# Patient Record
Sex: Female | Born: 1945 | Race: Black or African American | Hispanic: No | Marital: Married | Smoking: Former smoker
Health system: Southern US, Community
[De-identification: ages and names within clinical notes are randomized; demographics above are authoritative.]

## PROBLEM LIST (undated history)

## (undated) DIAGNOSIS — Z95810 Presence of automatic (implantable) cardiac defibrillator: Secondary | ICD-10-CM

## (undated) DIAGNOSIS — C50919 Malignant neoplasm of unspecified site of unspecified female breast: Secondary | ICD-10-CM

## (undated) DIAGNOSIS — I472 Ventricular tachycardia, unspecified: Secondary | ICD-10-CM

## (undated) DIAGNOSIS — E785 Hyperlipidemia, unspecified: Secondary | ICD-10-CM

## (undated) DIAGNOSIS — I2510 Atherosclerotic heart disease of native coronary artery without angina pectoris: Secondary | ICD-10-CM

## (undated) DIAGNOSIS — M84374A Stress fracture, right foot, initial encounter for fracture: Secondary | ICD-10-CM

## (undated) DIAGNOSIS — Z79811 Long term (current) use of aromatase inhibitors: Secondary | ICD-10-CM

## (undated) DIAGNOSIS — R21 Rash and other nonspecific skin eruption: Secondary | ICD-10-CM

## (undated) DIAGNOSIS — M1990 Unspecified osteoarthritis, unspecified site: Secondary | ICD-10-CM

## (undated) DIAGNOSIS — Z923 Personal history of irradiation: Secondary | ICD-10-CM

## (undated) DIAGNOSIS — I6529 Occlusion and stenosis of unspecified carotid artery: Secondary | ICD-10-CM

## (undated) DIAGNOSIS — N200 Calculus of kidney: Secondary | ICD-10-CM

## (undated) DIAGNOSIS — F1010 Alcohol abuse, uncomplicated: Secondary | ICD-10-CM

## (undated) DIAGNOSIS — R55 Syncope and collapse: Secondary | ICD-10-CM

## (undated) DIAGNOSIS — N179 Acute kidney failure, unspecified: Secondary | ICD-10-CM

## (undated) DIAGNOSIS — I10 Essential (primary) hypertension: Secondary | ICD-10-CM

## (undated) HISTORY — DX: Calculus of kidney: N20.0

## (undated) HISTORY — DX: Acute kidney failure, unspecified: N17.9

## (undated) HISTORY — DX: Atherosclerotic heart disease of native coronary artery without angina pectoris: I25.10

## (undated) HISTORY — PX: ABDOMINAL HYSTERECTOMY: SHX81

## (undated) HISTORY — DX: Alcohol abuse, uncomplicated: F10.10

## (undated) HISTORY — DX: Hyperlipidemia, unspecified: E78.5

## (undated) HISTORY — DX: Syncope and collapse: R55

## (undated) HISTORY — DX: Essential (primary) hypertension: I10

## (undated) HISTORY — DX: Malignant neoplasm of unspecified site of unspecified female breast: C50.919

## (undated) HISTORY — DX: Occlusion and stenosis of unspecified carotid artery: I65.29

## (undated) HISTORY — PX: CARDIAC CATHETERIZATION: SHX172

## (undated) HISTORY — DX: Ventricular tachycardia, unspecified: I47.20

## (undated) HISTORY — DX: Ventricular tachycardia: I47.2

---

## 1999-08-11 ENCOUNTER — Ambulatory Visit (HOSPITAL_COMMUNITY): Admission: RE | Admit: 1999-08-11 | Discharge: 1999-08-11 | Payer: Self-pay | Admitting: Orthopedic Surgery

## 2004-11-11 ENCOUNTER — Ambulatory Visit: Payer: Self-pay | Admitting: Psychiatry

## 2004-11-11 ENCOUNTER — Other Ambulatory Visit (HOSPITAL_COMMUNITY): Admission: RE | Admit: 2004-11-11 | Discharge: 2004-12-03 | Payer: Self-pay | Admitting: Psychiatry

## 2006-05-11 ENCOUNTER — Emergency Department (HOSPITAL_COMMUNITY): Admission: EM | Admit: 2006-05-11 | Discharge: 2006-05-11 | Payer: Self-pay | Admitting: Emergency Medicine

## 2009-07-05 HISTORY — PX: OTHER SURGICAL HISTORY: SHX169

## 2009-08-22 ENCOUNTER — Encounter: Payer: Self-pay | Admitting: Cardiology

## 2009-08-22 ENCOUNTER — Ambulatory Visit: Payer: Self-pay | Admitting: Internal Medicine

## 2009-08-22 ENCOUNTER — Encounter (INDEPENDENT_AMBULATORY_CARE_PROVIDER_SITE_OTHER): Payer: Self-pay | Admitting: Family Medicine

## 2009-08-22 LAB — CONVERTED CEMR LAB
Benzodiazepines.: NEGATIVE
Methadone: NEGATIVE

## 2009-08-23 ENCOUNTER — Encounter (INDEPENDENT_AMBULATORY_CARE_PROVIDER_SITE_OTHER): Payer: Self-pay | Admitting: Family Medicine

## 2009-09-04 DIAGNOSIS — R002 Palpitations: Secondary | ICD-10-CM

## 2009-09-04 DIAGNOSIS — R0602 Shortness of breath: Secondary | ICD-10-CM

## 2009-09-05 ENCOUNTER — Ambulatory Visit: Payer: Self-pay | Admitting: Cardiology

## 2009-09-05 ENCOUNTER — Ambulatory Visit: Payer: Self-pay

## 2009-09-05 DIAGNOSIS — R55 Syncope and collapse: Secondary | ICD-10-CM

## 2009-09-05 DIAGNOSIS — I10 Essential (primary) hypertension: Secondary | ICD-10-CM | POA: Insufficient documentation

## 2009-09-10 LAB — CONVERTED CEMR LAB
AST: 98 units/L — ABNORMAL HIGH (ref 0–37)
Albumin: 3.9 g/dL (ref 3.5–5.2)
Alkaline Phosphatase: 78 units/L (ref 39–117)
CO2: 30 meq/L (ref 19–32)
Calcium: 8.9 mg/dL (ref 8.4–10.5)
Cholesterol: 242 mg/dL — ABNORMAL HIGH (ref 0–200)
Direct LDL: 103.6 mg/dL
GFR calc non Af Amer: 129.71 mL/min (ref >60)
Total Bilirubin: 1.1 mg/dL (ref 0.3–1.2)
Total CHOL/HDL Ratio: 2
Triglycerides: 58 mg/dL (ref 0.0–149.0)
VLDL: 11.6 mg/dL (ref 0.0–40.0)

## 2009-09-15 ENCOUNTER — Ambulatory Visit: Payer: Self-pay | Admitting: Internal Medicine

## 2009-09-15 LAB — CONVERTED CEMR LAB
Basophils Relative: 1 % (ref 0–1)
Chloride: 104 meq/L (ref 96–112)
Cholesterol: 217 mg/dL — ABNORMAL HIGH (ref 0–200)
HCT: 46.9 % — ABNORMAL HIGH (ref 36.0–46.0)
LDL Cholesterol: 125 mg/dL — ABNORMAL HIGH (ref 0–99)
Lymphocytes Relative: 39 % (ref 12–46)
Lymphs Abs: 3.1 10*3/uL (ref 0.7–4.0)
MCHC: 33 g/dL (ref 30.0–36.0)
Monocytes Relative: 8 % (ref 3–12)
Neutrophils Relative %: 50 % (ref 43–77)
Platelets: 234 10*3/uL (ref 150–400)
TSH: 1.523 microintl units/mL (ref 0.350–4.500)
Triglycerides: 92 mg/dL (ref <150)

## 2009-09-16 ENCOUNTER — Telehealth: Payer: Self-pay | Admitting: Cardiology

## 2009-09-16 DIAGNOSIS — I472 Ventricular tachycardia, unspecified: Secondary | ICD-10-CM | POA: Insufficient documentation

## 2009-09-24 ENCOUNTER — Ambulatory Visit: Payer: Self-pay | Admitting: Internal Medicine

## 2009-09-24 ENCOUNTER — Encounter (INDEPENDENT_AMBULATORY_CARE_PROVIDER_SITE_OTHER): Payer: Self-pay | Admitting: Family Medicine

## 2009-09-24 LAB — CONVERTED CEMR LAB: Hgb A1c MFr Bld: 4.9 % (ref 4.6–6.1)

## 2009-10-02 ENCOUNTER — Ambulatory Visit (HOSPITAL_COMMUNITY): Admission: RE | Admit: 2009-10-02 | Discharge: 2009-10-02 | Payer: Self-pay | Admitting: Family Medicine

## 2009-10-02 ENCOUNTER — Telehealth (INDEPENDENT_AMBULATORY_CARE_PROVIDER_SITE_OTHER): Payer: Self-pay | Admitting: *Deleted

## 2009-10-03 HISTORY — PX: CORONARY ARTERY BYPASS GRAFT: SHX141

## 2009-10-06 ENCOUNTER — Encounter (HOSPITAL_COMMUNITY): Admission: RE | Admit: 2009-10-06 | Discharge: 2009-12-03 | Payer: Self-pay | Admitting: Cardiology

## 2009-10-06 ENCOUNTER — Ambulatory Visit: Payer: Self-pay | Admitting: Internal Medicine

## 2009-10-06 ENCOUNTER — Ambulatory Visit: Payer: Self-pay

## 2009-10-06 ENCOUNTER — Encounter: Payer: Self-pay | Admitting: Cardiology

## 2009-10-08 ENCOUNTER — Telehealth: Payer: Self-pay | Admitting: Cardiology

## 2009-10-08 ENCOUNTER — Ambulatory Visit: Payer: Self-pay | Admitting: Cardiology

## 2009-10-08 DIAGNOSIS — I5042 Chronic combined systolic (congestive) and diastolic (congestive) heart failure: Secondary | ICD-10-CM | POA: Insufficient documentation

## 2009-10-08 DIAGNOSIS — I5022 Chronic systolic (congestive) heart failure: Secondary | ICD-10-CM | POA: Insufficient documentation

## 2009-10-08 DIAGNOSIS — I428 Other cardiomyopathies: Secondary | ICD-10-CM

## 2009-10-09 ENCOUNTER — Encounter: Payer: Self-pay | Admitting: Cardiology

## 2009-10-09 ENCOUNTER — Encounter: Admission: RE | Admit: 2009-10-09 | Discharge: 2009-10-09 | Payer: Self-pay | Admitting: Family Medicine

## 2009-10-13 LAB — CONVERTED CEMR LAB
Albumin ELP: 52.5 % — ABNORMAL LOW (ref 55.8–66.1)
Alpha 1, Urine: DETECTED % — AB
Alpha 2, Urine: DETECTED % — AB
Alpha-2-Globulin: 12.3 % — ABNORMAL HIGH (ref 7.1–11.8)
BUN: 7 mg/dL (ref 6–23)
Basophils Absolute: 0.1 10*3/uL (ref 0.0–0.1)
Beta Globulin: 6.5 % (ref 4.7–7.2)
Beta, Urine: DETECTED % — AB
Calcium: 9.1 mg/dL (ref 8.4–10.5)
Creatinine, Ser: 0.6 mg/dL (ref 0.4–1.2)
Eosinophils Absolute: 0.1 10*3/uL (ref 0.0–0.7)
Eosinophils Relative: 0.7 % (ref 0.0–5.0)
Free Kappa Lt Chains,Ur: 3.08 mg/dL — ABNORMAL HIGH (ref 0.04–1.51)
INR: 1.5 — ABNORMAL HIGH (ref 0.8–1.0)
IgM, Serum: 75 mg/dL (ref 60–263)
MCHC: 34.9 g/dL (ref 30.0–36.0)
Neutro Abs: 6.1 10*3/uL (ref 1.4–7.7)
Potassium: 4.2 meq/L (ref 3.5–5.1)
Pro B Natriuretic peptide (BNP): 80 pg/mL (ref 0.0–100.0)
RBC: 4.34 M/uL (ref 3.87–5.11)
RDW: 12.4 % (ref 11.5–14.6)
TSH: 1.34 microintl units/mL (ref 0.35–5.50)
Total Protein, Urine: 4.7 mg/dL
WBC: 10.6 10*3/uL — ABNORMAL HIGH (ref 4.5–10.5)

## 2009-10-14 ENCOUNTER — Ambulatory Visit: Payer: Self-pay | Admitting: Cardiology

## 2009-10-14 ENCOUNTER — Encounter: Payer: Self-pay | Admitting: Thoracic Surgery (Cardiothoracic Vascular Surgery)

## 2009-10-14 ENCOUNTER — Ambulatory Visit: Payer: Self-pay | Admitting: Thoracic Surgery (Cardiothoracic Vascular Surgery)

## 2009-10-14 ENCOUNTER — Inpatient Hospital Stay (HOSPITAL_COMMUNITY): Admission: AD | Admit: 2009-10-14 | Discharge: 2009-10-20 | Payer: Self-pay | Admitting: Cardiology

## 2009-10-14 ENCOUNTER — Inpatient Hospital Stay (HOSPITAL_BASED_OUTPATIENT_CLINIC_OR_DEPARTMENT_OTHER): Admission: RE | Admit: 2009-10-14 | Discharge: 2009-10-14 | Payer: Self-pay | Admitting: Cardiology

## 2009-10-22 ENCOUNTER — Encounter: Payer: Self-pay | Admitting: Cardiology

## 2009-10-31 ENCOUNTER — Telehealth: Payer: Self-pay | Admitting: Cardiology

## 2009-11-06 ENCOUNTER — Encounter
Admission: RE | Admit: 2009-11-06 | Discharge: 2009-11-06 | Payer: Self-pay | Admitting: Thoracic Surgery (Cardiothoracic Vascular Surgery)

## 2009-11-06 ENCOUNTER — Ambulatory Visit: Payer: Self-pay | Admitting: Thoracic Surgery (Cardiothoracic Vascular Surgery)

## 2009-11-06 ENCOUNTER — Encounter: Payer: Self-pay | Admitting: Cardiology

## 2009-11-11 ENCOUNTER — Ambulatory Visit: Payer: Self-pay | Admitting: Cardiology

## 2009-11-11 DIAGNOSIS — R0989 Other specified symptoms and signs involving the circulatory and respiratory systems: Secondary | ICD-10-CM

## 2009-11-11 DIAGNOSIS — I25810 Atherosclerosis of coronary artery bypass graft(s) without angina pectoris: Secondary | ICD-10-CM | POA: Insufficient documentation

## 2009-11-25 ENCOUNTER — Encounter: Payer: Self-pay | Admitting: Cardiology

## 2009-11-26 ENCOUNTER — Ambulatory Visit: Payer: Self-pay | Admitting: Cardiology

## 2009-11-28 LAB — CONVERTED CEMR LAB
Chloride: 110 meq/L (ref 96–112)
Creatinine, Ser: 0.6 mg/dL (ref 0.4–1.2)
GFR calc non Af Amer: 134.78 mL/min (ref >60)
Glucose, Bld: 101 mg/dL — ABNORMAL HIGH (ref 70–99)
Sodium: 144 meq/L (ref 135–145)

## 2009-12-02 ENCOUNTER — Telehealth: Payer: Self-pay | Admitting: Cardiology

## 2009-12-04 ENCOUNTER — Telehealth (INDEPENDENT_AMBULATORY_CARE_PROVIDER_SITE_OTHER): Payer: Self-pay | Admitting: *Deleted

## 2009-12-15 ENCOUNTER — Telehealth: Payer: Self-pay | Admitting: Cardiology

## 2009-12-18 ENCOUNTER — Telehealth (INDEPENDENT_AMBULATORY_CARE_PROVIDER_SITE_OTHER): Payer: Self-pay | Admitting: *Deleted

## 2009-12-24 ENCOUNTER — Ambulatory Visit: Payer: Self-pay | Admitting: Cardiology

## 2009-12-25 ENCOUNTER — Encounter (INDEPENDENT_AMBULATORY_CARE_PROVIDER_SITE_OTHER): Payer: Self-pay | Admitting: *Deleted

## 2009-12-25 ENCOUNTER — Ambulatory Visit: Payer: Self-pay | Admitting: Internal Medicine

## 2009-12-29 ENCOUNTER — Ambulatory Visit: Payer: Self-pay | Admitting: Cardiology

## 2009-12-29 ENCOUNTER — Encounter: Payer: Self-pay | Admitting: Cardiology

## 2009-12-29 ENCOUNTER — Ambulatory Visit: Payer: Self-pay

## 2009-12-29 ENCOUNTER — Ambulatory Visit (HOSPITAL_COMMUNITY): Admission: RE | Admit: 2009-12-29 | Discharge: 2009-12-29 | Payer: Self-pay | Admitting: Cardiology

## 2009-12-31 ENCOUNTER — Encounter (INDEPENDENT_AMBULATORY_CARE_PROVIDER_SITE_OTHER): Payer: Self-pay | Admitting: *Deleted

## 2009-12-31 ENCOUNTER — Telehealth: Payer: Self-pay | Admitting: Cardiology

## 2009-12-31 ENCOUNTER — Ambulatory Visit: Payer: Self-pay | Admitting: Internal Medicine

## 2009-12-31 ENCOUNTER — Inpatient Hospital Stay (HOSPITAL_COMMUNITY): Admission: EM | Admit: 2009-12-31 | Discharge: 2010-01-04 | Payer: Self-pay | Admitting: Emergency Medicine

## 2009-12-31 LAB — CONVERTED CEMR LAB
AST: 15 units/L (ref 0–37)
Alkaline Phosphatase: 64 units/L (ref 39–117)
Bilirubin, Direct: 0.1 mg/dL (ref 0.0–0.3)
Cholesterol: 154 mg/dL (ref 0–200)
HDL: 51.9 mg/dL (ref >39.00)
Total Bilirubin: 0.7 mg/dL (ref 0.3–1.2)
Total CHOL/HDL Ratio: 3

## 2010-01-02 ENCOUNTER — Encounter: Payer: Self-pay | Admitting: Internal Medicine

## 2010-01-03 ENCOUNTER — Encounter: Payer: Self-pay | Admitting: Internal Medicine

## 2010-01-03 ENCOUNTER — Encounter: Payer: Self-pay | Admitting: Cardiology

## 2010-01-06 ENCOUNTER — Encounter: Payer: Self-pay | Admitting: Internal Medicine

## 2010-01-14 ENCOUNTER — Ambulatory Visit: Payer: Self-pay

## 2010-01-14 ENCOUNTER — Encounter: Payer: Self-pay | Admitting: Internal Medicine

## 2010-01-21 ENCOUNTER — Telehealth: Payer: Self-pay | Admitting: Cardiology

## 2010-01-27 ENCOUNTER — Telehealth (INDEPENDENT_AMBULATORY_CARE_PROVIDER_SITE_OTHER): Payer: Self-pay | Admitting: *Deleted

## 2010-01-29 ENCOUNTER — Ambulatory Visit: Payer: Self-pay | Admitting: Internal Medicine

## 2010-02-11 ENCOUNTER — Ambulatory Visit: Payer: Self-pay | Admitting: Cardiology

## 2010-02-11 ENCOUNTER — Encounter: Payer: Self-pay | Admitting: Cardiology

## 2010-02-11 ENCOUNTER — Inpatient Hospital Stay (HOSPITAL_COMMUNITY): Admission: EM | Admit: 2010-02-11 | Discharge: 2010-02-15 | Payer: Self-pay | Admitting: Cardiology

## 2010-02-11 ENCOUNTER — Encounter: Payer: Self-pay | Admitting: Internal Medicine

## 2010-02-11 DIAGNOSIS — I6529 Occlusion and stenosis of unspecified carotid artery: Secondary | ICD-10-CM

## 2010-02-16 ENCOUNTER — Telehealth: Payer: Self-pay | Admitting: Cardiology

## 2010-02-18 ENCOUNTER — Ambulatory Visit: Payer: Self-pay | Admitting: Cardiology

## 2010-02-23 LAB — CONVERTED CEMR LAB
CO2: 25 meq/L (ref 19–32)
Calcium: 8 mg/dL — ABNORMAL LOW (ref 8.4–10.5)
Chloride: 114 meq/L — ABNORMAL HIGH (ref 96–112)
Glucose, Bld: 83 mg/dL (ref 70–99)
Potassium: 3.8 meq/L (ref 3.5–5.1)
Sodium: 147 meq/L — ABNORMAL HIGH (ref 135–145)

## 2010-02-26 ENCOUNTER — Ambulatory Visit: Payer: Self-pay | Admitting: Cardiology

## 2010-02-28 ENCOUNTER — Ambulatory Visit: Payer: Self-pay | Admitting: Internal Medicine

## 2010-03-02 ENCOUNTER — Telehealth: Payer: Self-pay | Admitting: Cardiology

## 2010-03-11 ENCOUNTER — Ambulatory Visit: Payer: Self-pay | Admitting: Cardiology

## 2010-03-13 ENCOUNTER — Ambulatory Visit: Payer: Self-pay | Admitting: Internal Medicine

## 2010-03-16 LAB — CONVERTED CEMR LAB
BUN: 17 mg/dL (ref 6–23)
CO2: 26 meq/L (ref 19–32)
Calcium: 9.4 mg/dL (ref 8.4–10.5)
Chloride: 106 meq/L (ref 96–112)
Creatinine, Ser: 0.6 mg/dL (ref 0.4–1.2)
Glucose, Bld: 80 mg/dL (ref 70–99)

## 2010-04-14 ENCOUNTER — Ambulatory Visit: Payer: Self-pay

## 2010-04-14 ENCOUNTER — Ambulatory Visit: Payer: Self-pay | Admitting: Internal Medicine

## 2010-04-14 ENCOUNTER — Encounter: Payer: Self-pay | Admitting: Cardiology

## 2010-04-14 DIAGNOSIS — Z95810 Presence of automatic (implantable) cardiac defibrillator: Secondary | ICD-10-CM | POA: Insufficient documentation

## 2010-04-21 ENCOUNTER — Ambulatory Visit: Payer: Self-pay | Admitting: Cardiology

## 2010-04-21 ENCOUNTER — Encounter: Payer: Self-pay | Admitting: Internal Medicine

## 2010-04-29 ENCOUNTER — Telehealth (INDEPENDENT_AMBULATORY_CARE_PROVIDER_SITE_OTHER): Payer: Self-pay | Admitting: *Deleted

## 2010-05-06 ENCOUNTER — Ambulatory Visit: Payer: Self-pay | Admitting: Cardiology

## 2010-05-11 LAB — CONVERTED CEMR LAB
CO2: 27 meq/L (ref 19–32)
Chloride: 107 meq/L (ref 96–112)
Potassium: 4.7 meq/L (ref 3.5–5.1)
Sodium: 140 meq/L (ref 135–145)

## 2010-06-03 ENCOUNTER — Encounter: Payer: Self-pay | Admitting: Internal Medicine

## 2010-06-03 ENCOUNTER — Ambulatory Visit: Payer: Self-pay

## 2010-06-03 ENCOUNTER — Ambulatory Visit: Payer: Self-pay | Admitting: Cardiology

## 2010-06-04 LAB — CONVERTED CEMR LAB
ALT: 18 units/L (ref 0–35)
AST: 18 units/L (ref 0–37)
HDL: 55.3 mg/dL (ref >39.00)
Total CHOL/HDL Ratio: 3
Triglycerides: 69 mg/dL (ref 0.0–149.0)
VLDL: 13.8 mg/dL (ref 0.0–40.0)

## 2010-06-15 ENCOUNTER — Ambulatory Visit: Payer: Self-pay | Admitting: Cardiology

## 2010-06-17 LAB — CONVERTED CEMR LAB
Chloride: 105 meq/L (ref 96–112)
Creatinine, Ser: 0.7 mg/dL (ref 0.4–1.2)

## 2010-07-15 ENCOUNTER — Ambulatory Visit: Admission: RE | Admit: 2010-07-15 | Discharge: 2010-07-15 | Payer: Self-pay | Source: Home / Self Care

## 2010-07-15 ENCOUNTER — Encounter: Payer: Self-pay | Admitting: Internal Medicine

## 2010-07-30 ENCOUNTER — Ambulatory Visit: Admission: RE | Admit: 2010-07-30 | Discharge: 2010-07-30 | Payer: Self-pay | Source: Home / Self Care

## 2010-07-30 ENCOUNTER — Other Ambulatory Visit: Payer: Self-pay | Admitting: Cardiology

## 2010-07-30 DIAGNOSIS — E7800 Pure hypercholesterolemia, unspecified: Secondary | ICD-10-CM | POA: Insufficient documentation

## 2010-07-30 LAB — HEPATIC FUNCTION PANEL
ALT: 17 U/L (ref 0–35)
AST: 19 U/L (ref 0–37)
Bilirubin, Direct: 0 mg/dL (ref 0.0–0.3)

## 2010-07-30 LAB — LIPID PANEL
Cholesterol: 125 mg/dL (ref 0–200)
HDL: 41 mg/dL (ref >39.00)
Total CHOL/HDL Ratio: 3

## 2010-08-02 LAB — CONVERTED CEMR LAB
BUN: 12 mg/dL (ref 6–23)
BUN: 9 mg/dL (ref 6–23)
Calcium: 9.6 mg/dL (ref 8.4–10.5)
Creatinine, Ser: 0.6 mg/dL (ref 0.4–1.2)
Eosinophils Relative: 5.3 % — ABNORMAL HIGH (ref 0.0–5.0)
Glucose, Bld: 76 mg/dL (ref 70–99)
Hemoglobin: 12.1 g/dL (ref 12.0–15.0)
MCHC: 33.8 g/dL (ref 30.0–36.0)
MCV: 104.7 fL — ABNORMAL HIGH (ref 78.0–100.0)
Monocytes Absolute: 0.4 10*3/uL (ref 0.1–1.0)
Neutro Abs: 4.4 10*3/uL (ref 1.4–7.7)
RBC: 3.42 M/uL — ABNORMAL LOW (ref 3.87–5.11)
Sodium: 144 meq/L (ref 135–145)

## 2010-08-04 NOTE — Progress Notes (Signed)
Summary: Question about medication  Norvasc  Phone Note Call from Patient Call back at Home Phone [phone]   Caller: Patient Summary of Call: Pt have question about a medication Initial call taken by: Judie Grieve,  December 18, 2009 3:03 PM  Follow-up for Phone Call        Mrs. Bewley picked up her Crestor yesterday at [facility] and found a prescription bottle of Norvasc as well.  She wanted to know if she was supposed to be taking Norvasc.  She has not been on Norvasc.  Upon researching this it was found to have been prescribed in March.  She subsequently underwent heart cath and CABG with no mention of Norvasc as a discharge med.  I advised Mrs. Tabor not to start the Norvasc and she will return the bottle to [facility]. Lisabeth Devoid RN     Appended Document: Question about medication  Norvasc Norvasc D/C'd by Dr Shirlee Latch 10-08-09

## 2010-08-04 NOTE — Progress Notes (Signed)
Summary: medication question  Phone Note Call from Patient Call back at Home Phone [phone]   Caller: Patient Reason for Call: Talk to Nurse, Talk to Doctor Summary of Call: pt was discharged over the weekend and was put on imdur and hydralazine and [facility] will not have it in until tomorrow is that ok because she has been with out it all weekend is that ok or what is she to do Initial call taken by: Omer Jack,  February 16, 2010 9:34 AM  Follow-up for Phone Call        OK to wait to get Hydralazine and Imdur from [facility] tomorrow per Dr Almon Hercules will let me know if she is unable to get  it tomorrow Katina Dung, RN, BSN  February 16, 2010 12:41 PM

## 2010-08-04 NOTE — Cardiovascular Report (Signed)
Summary: Office Visit   Office Visit   Imported By: Roderic Ovens 01/21/2010 16:17:53  _____________________________________________________________________  External Attachment:    Type:   Image     Comment:   External Document

## 2010-08-04 NOTE — Assessment & Plan Note (Signed)
Summary: 6wk f/u sl   Primary Provider:  Dr. Raelyn Ensign   History of Present Illness: 65 yo with history of cardiomyopathy s/p ICD, and CAD s/p CABG presents for followup.   Patient had a left heart cath in 4/11 which showed a 75% ostial left main stenosis.  She had CABG x 2.  She has moderate to severe LV systolic dysfunction that I think is a mixed picture, due to both CAD and probably ETOH abuse.  Since her CABG, she has quit smoking and quit drinking ETOH. She had syncope and episodes of VT on monitor, so Medtronic ICD was placed in 7/11.    Since last appointment, she has been doing well symptomatically.  She is able to walk to [employer] without dyspnea and can walk up a flight of steps without dyspnea.  No orthopnea or PND.  No chest pain.  No ICD discharges.  Optivol was checked today: fluid index is high and impedance is trending down again.  Labs (8/11): BNP 181 Labs (9/11): K 4.4, creatinine 0.6 Labs (10/11): BNP 95 Labs (11/11): BNP 65, creatinine 0.8, K 4.7, LDL 86, HDL 55   Current Medications (verified): 1)  Aspir-Trin 325 Mg Tbec (Aspirin) .... One Daily 2)  Crestor 20 Mg Tabs (Rosuvastatin Calcium) .... One Tablet Daily 3)  Carvedilol 6.25 Mg Tabs (Carvedilol) .[name].. 1  By Mouth Two Times A Day 4)  Calcium With Vitamin D 1200/1000 .... 1/2 Two Times A Day 5)  Nitrostat 0.4 Mg Subl (Nitroglycerin) .[name].. 1 Tablet Under Tongue At Onset of Chest Pain; You May Repeat Every 5 Minutes For Up To 3 Doses. 6)  Potassium Chloride Cr 10 Meq Cr-Caps (Potassium Chloride) .... Take One Tablet By Mouth Daily 7)  Enalapril Maleate 2.5 Mg Tabs (Enalapril Maleate) .... One Twice A Day 8)  Furosemide 20 Mg Tabs (Furosemide) .... Take 1 Tablet Everyday 9)  Spironolactone 25 Mg Tabs (Spironolactone) .... Take 1/2 Tablet Everyday 10)  Gas Medication .... As Needed  Allergies: 1)  ! Pcn  Past History:  Past Medical History: Reviewed history from 04/21/2010 and no changes  required. 1. HTN 2. ETOH abuse: Elevated AST, mildly elevated INR (? component of cirrhosis).  3. Prior 4. Hyperlipidemia 5. Nephrolithiasis 6. Cardiomyopathy: Echo (4/11) with EF 25%, diffuse hypokinesis, moderate LV dilation, decreased RV systolic function but normal RV size, PA systolic pressure 35 mmHg. Lexiscan myoview (4/11): EF 29%, diffuse hypokinesis, basal anteroseptal and apical scar, no ischemia.  The cardiomyopathy is likely due to a combination of CAD (LM stenosis) and ETOH abuse.  ANA and SPEP were negative.  Echo (6/11): EF 25%, mild diastolic dysfunction, normal RV, no pulmonary HTN 7. VT: Dual chamber Medtronic ICD placed 7/11 8. CAD: LHC (4/11) showed 75% ostial LM stenosis with significant damping upon catheter engagement, 60-70% mLAD, 60% ostial D1, EF 30%.  Patient had CABG with LIMA-LAD and SVG-OM2.  9. carotid stenosis: carotid dopplers (4/11) with 60-79% LICA stenosis.  Carotid dopplers (10/11): 60-79% LICA stenosis.   10.  Syncope 11.  ARF in setting of dehydration with ACEI use.   Family History: Reviewed history from 10/08/2009 and no changes required. Sister with heart surgery, she is not sure why.   Social History: Reviewed history from 11/11/2009 and no changes required. 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 Married, lives in [place] Quit smoking 4/11 Prior moderate ETOH use (2 liquor drinks daily) but quit in 3/11.  No drug use and negative urine drug screen in  2/11  Review of Systems       All systems reviewed and negative except as per HPI.   Vital Signs:  Patient profile:   65 year old female Height:      71 inches Weight:      147 pounds BMI:     20.58 Pulse rate:   84 / minute Resp:     16 per minute BP sitting:   118 / 70  (left arm)  Vitals Entered By: Kem Parkinson (June 03, 2010 8:34 AM)  Physical Exam  General:  Well developed, well nourished, in no acute distress. Neck:  Neck supple, no JVD. No masses, thyromegaly or  abnormal cervical nodes. Lungs:  Clear bilaterally to auscultation and percussion. Heart:  Non-displaced PMI, chest non-tender; regular rate and rhythm, S1, S2 without murmurs, rubs. +S4. Carotid upstroke normal, no bruit.  Pedals normal pulses. No edema, no varicosities. Abdomen:  Bowel sounds positive; abdomen soft and non-tender without masses, organomegaly, or hernias noted. No hepatosplenomegaly. Extremities:  No clubbing or cyanosis. Neurologic:  Alert and oriented x 3. Psych:  Normal affect.    ICD Specifications Following MD:  Sherryl Manges, MD     ICD Vendor:  Medtronic     ICD Model Number:  [id number]     ICD Serial Number:  [id number] ICD DOI:  01/02/2010     ICD Implanting MD:  Sherryl Manges, MD  Lead 1:    Location: RA     DOI: 01/02/2010     Model #: [id number]     Serial #: [id number]     Status: active Lead 2:    Location: RV     DOI: 01/02/2010     Model #: 6213     Serial #: [id number]     Status: active  Indications::  VT   Huston Foley Parameters Mode MVP     Lower Rate Limit:  60     Upper Rate Limit 130 PAV 180     Sensed AV Delay:  150  Tachy Zones VF:  222     VT:  188-222     VT1:  OFF     Atrial Zones:  171 (MONITOR)  Impression & Recommendations:  Problem # 1:  CARDIOMYOPATHY (ICD-425.4) Probably due both to CAD and ETOH abuse.  She has quit drinking and has had CABG.   NYHA class II symptoms.  Optivol fluid index is high and impedance is trending down, suggesting volume is trending up.  Interestingly, she does not look overloaded on exam and her symptoms are stable and minimal.  BNP was only 65 when checked this month.  She had a similar reading by Optivol at last appointment when asymptomatic and looking good on exam.   - Continue current Coreg and spironolactone. - Increase enalapril to 5 mg two times a day, BMET 2 wks.  - Will not increase Lasix at this time.  I have asked her to watch her salt intake very closely.   Problem # 2:  CORONARY ATHEROSLERO UNSPEC TYPE  BYPASS GRAFT (ICD-414.05) Stable with no chest pain.  Continue current meds.  LDL is close to goal but still a bit high.  Will incresae Crestor to 40 mg daily for goal LDL < 70.   Problem # 3:  CAROTID ARTERY STENOSIS (ICD-433.10) Stable 60-79% LICA stenosis.  Followup dopplers in 4/12.  Other Orders: Carotid Duplex (Carotid Duplex) TLB-Lipid Panel (80061-LIPID) TLB-Hepatic/Liver Function Pnl (80076-HEPATIC)  Patient Instructions: 1)  Your physician has recommended  you make the following change in your medication:  2)  Increase Enalapril 5mg  twice a day. 3)  Your physician recommends that you have lab today---lipid/liver 428.22  414.05 4)  Lab in 2 weeks--BMP 428.22  414.05 5)  Your physician recommends that you schedule a follow-up appointment in: 3 months with Dr Shirlee Latch. 6)  Your physician has requested that you have a carotid duplex. This test is an ultrasound of the carotid arteries in your neck. It looks at blood flow through these arteries that supply the brain with blood. Allow one hour for this exam. There are no restrictions or special instructions. APRIL 2012 Prescriptions: ENALAPRIL MALEATE 5 MG TABS (ENALAPRIL MALEATE) one twice a day  #60 x 6   Entered by:   Katina Dung, RN, BSN   Authorized by:   Marca Ancona, MD   Signed by:   Katina Dung, RN, BSN on 06/03/2010   Method used:   Electronically to        [facility] [phone]* (retail)       [address]       [place], [place]  [address]       Ph: [phone]       Fax: [phone]   RxID:   [id number]

## 2010-08-04 NOTE — Progress Notes (Signed)
  Request Recieved from [facility] Sent to [facility]  December 04, 2009 10:43 AM

## 2010-08-04 NOTE — Progress Notes (Signed)
Summary: lifewatch tracings  Phone Note Outgoing Call   Call placed by: Katina Dung, RN, BSN,  September 16, 2009 5:37 PM Call placed to: Patient Summary of Call: NSVT on lifewatch monitor 09-16-09 2:55pm CST  Follow-up for Phone Call        received tracings from lifewatch-09-16-09 2:55pm reviewed by Dr Shirlee Latch-- NSVT, Dr Shirlee Latch recommended Lopressor 25mg  two times a day and stress myoview-I talked with pt by telephone-she will get Lopressor from [facility], she agreed to stress myoview and I reviewed instructions by telephone wilth her   New Problems: VENTRICULAR TACHYCARDIA (ICD-427.1)   New Problems: VENTRICULAR TACHYCARDIA (ICD-427.1) New/Updated Medications: LOPRESSOR 50 MG TABS (METOPROLOL TARTRATE) one-half tablet twice a day Prescriptions: LOPRESSOR 50 MG TABS (METOPROLOL TARTRATE) one-half tablet twice a day  #30 x 3   Entered by:   Katina Dung, RN, BSN   Authorized by:   Marca Ancona, MD   Signed by:   Katina Dung, RN, BSN on 09/16/2009   Method used:   Electronically to        [facility] [phone]* (retail)       [address]       [place], [place]  [address]       Ph: [phone]       Fax: [phone]   RxID:   [id number]

## 2010-08-04 NOTE — Progress Notes (Signed)
Summary: Nuclear Pre-Procedure  Phone Note Outgoing Call Call back at [facility] Phone [phone]   Call placed by: Stanton Kidney, EMT-P,  October 02, 2009 2:29 PM Call placed to: Patient Action Taken: Phone Call Completed Summary of Call: Reviewed information on Myoview Information Sheet (see scanned document for further details).  Spoke with Patient.    Nuclear Med Background Indications for Stress Test: Evaluation for Ischemia   History: Abnormal EKG   Symptoms: Light-Headedness, Near Syncope, Palpitations, SOB    Nuclear Pre-Procedure Cardiac Risk Factors: Hypertension, Lipids, Smoker Height (in): 69

## 2010-08-04 NOTE — Letter (Signed)
Summary: Cardiac Rehab Program  Cardiac Rehab Program   Imported By: Marylou Mccoy 12/24/2009 18:55:48  _____________________________________________________________________  External Attachment:    Type:   Image     Comment:   External Document

## 2010-08-04 NOTE — Letter (Signed)
Summary: [facility]  [facility]   Imported By: Debby Freiberg 01/02/2010 13:09:16  _____________________________________________________________________  External Attachment:    Type:   Image     Comment:   External Document

## 2010-08-04 NOTE — Cardiovascular Report (Signed)
Summary: Office Visit   Office Visit   Imported By: Roderic Ovens 04/16/2010 12:00:16  _____________________________________________________________________  External Attachment:    Type:   Image     Comment:   External Document

## 2010-08-04 NOTE — Progress Notes (Signed)
Summary: pt needs this asap on her last set  Phone Note Refill Request Call back at Home Phone [phone] Message from:  Patient on [facility] [address]  Refills Requested: Medication #1:  CHANTIX STARTING MONTH PAK 0.5 MG X 11 & 1 MG X 42 TABS take as directed Initial call taken by: Omer Jack,  October 31, 2009 9:35 AM    Prescriptions: CHANTIX CONTINUING MONTH PAK 1 MG TABS (VARENICLINE TARTRATE) take as directed  #1 x 1   Entered by:   Judithe Modest CMA   Authorized by:   Marca Ancona, MD   Signed by:   Judithe Modest CMA on 10/31/2009   Method used:   Electronically to        [facility] [phone]* (retail)       [address]       [place], [place]  [address]       Ph: [phone]       Fax: [phone]   RxID:   [id number]

## 2010-08-04 NOTE — Assessment & Plan Note (Signed)
Summary: Cardiology Nuclear Study  Nuclear Med Background Indications for Stress Test: Evaluation for Ischemia   History: Abnormal EKG  History Comments: Recent lifewatch tracing: showed NSVT. NO DOCUMENTED CAD  Symptoms: Light-Headedness, Near Syncope, Palpitations, SOB    Nuclear Pre-Procedure Cardiac Risk Factors: Family History - CAD, Hypertension, Lipids, Smoker Caffeine/Decaff Intake: None NPO After: 2:00 PM Lungs: Clear.  O2 Sat 97% on RA. IV 0.9% NS with Angio Cath: 22g     IV Site: (R) Hand IV Started by: Irean Hong RN Chest Size (in) 40     Cup Size DD     Height (in): 69 Weight (lb): 153 BMI: 22.68 Tech Comments: Metoprolol last dose 2 PM yesterday,baseline HR=114-122; EKG  performed. The rhythm  was Sinus Tachycardia. Cleon Gustin.  Nuclear Med Study 1 or 2 day study:  1 day     Stress Test Type:  Eugenie Birks Reading MD:  Dietrich Pates, MD     Referring MD:  Marca Ancona, MD Resting Radionuclide:  Technetium 57m Tetrofosmin     Resting Radionuclide Dose:  11 mCi  Stress Radionuclide:  Technetium 69m Tetrofosmin     Stress Radionuclide Dose:  33 mCi   Stress Protocol   Lexiscan: 0.4 mg   Stress Test Technologist:  Rea [employer] CMA-N     Nuclear Technologist:  Burna Mortimer Deal RT-N  Rest Procedure  Myocardial perfusion imaging was performed at rest 45 minutes following the intravenous administration of Myoview Technetium 70m Tetrofosmin.  Stress Procedure  The patient was initially scheduled to walk the treadmill utilizing the Bruce protocol, but had to be changed to lexiscan due to baseline heart rate of 111-120, while on Lopressor.  She then received IV Lexiscan 0.4 mg over 15-seconds.  Myoview injected at 30-seconds.  There were no significant changes with lexiscan, only occasional PVC's.  She did c/o slight chest tightness.  Quantitative spect images were obtained after a 45 minute delay.  QPS Raw Data Images:  Stress images were motion corrected.  soft  tissue (diaphragm, subcutaneous fat ) surround heart Stress Images:  Anteroseptal defect (base); apical defect.  Otherwise normal perfusion. Rest Images:  Minnimal change from the stress images. Transient Ischemic Dilatation:  1.26  (Normal <1.22)  Lung/Heart Ratio:  .37  (Normal <0.45)  Quantitative Gated Spect Images QGS EDV:  132 ml QGS ESV:  94 ml QGS EF:  29 % QGS cine images:  Diffuse hypokinesis.   Overall Impression  Exercise Capacity: Lexiscan protocol BP Response: Normal blood pressure response. Clinical Symptoms: Slight chest tightness. ECG Impression: Less than 1 mm ST depression in leads V4 and V5 with peak infusion.  Not significant for ischemia. Overall Impression Comments: Electrically negative for ischemia.  Myoview with evidence of possible basal anteroseptal and apical scar.  No evidence for significant ischemia.  LVEF 29% with diffuse hypokinesis.  Appended Document: Cardiology Nuclear Study Some scar, cardiomyopathy.  Need to rule out CAD.  Needs cath.   Appended Document: Cardiology Nuclear Study pt to come to see Dr Shirlee Latch 10-08-09 to discuss findings

## 2010-08-04 NOTE — Progress Notes (Signed)
Summary: med question  Phone Note From Pharmacy Call back at [phone]   Caller: [facility] [address] Summary of Call: has a question about prescription Chantix Initial call taken by: Migdalia Dk,  October 08, 2009 1:58 PM    New/Updated Medications: CHANTIX STARTING MONTH PAK 0.5 MG X 11 & 1 MG X 42 TABS (VARENICLINE TARTRATE) take as directed Prescriptions: CHANTIX STARTING MONTH PAK 0.5 MG X 11 & 1 MG X 42 TABS (VARENICLINE TARTRATE) take as directed  #1 x 0   Entered by:   Katina Dung, RN, BSN   Authorized by:   Marca Ancona, MD   Signed by:   Katina Dung, RN, BSN on 10/08/2009   Method used:   Electronically to        [facility] [phone]* (retail)       [address]       [place], [place]  [address]       Ph: [phone]       Fax: [phone]   RxID:   [id number]  [facility] did not recieve prescription for Chantix starting month pak--I sent Rx to [facility]

## 2010-08-04 NOTE — Miscellaneous (Signed)
Summary: [facility]iac Progress Note  [facility]   Imported By: Roderic Ovens 12/11/2009 15:45:34  _____________________________________________________________________  External Attachment:    Type:   Image     Comment:   External Document

## 2010-08-04 NOTE — Miscellaneous (Signed)
Summary: Appointment Canceled  Appointment status changed to canceled by [employer] on 12/25/2009 11:06 AM.  Cancellation Comments --------------------- ECHO/425.4/SELF/MCLEAN,DALTON  Appointment Information ----------------------- Appt Type:  CARDIOLOGY ANCILLARY VISIT      Date:  Friday, January 16, 2010      Time:  10:30 AM for 60 min   Urgency:  Routine   Made By:  Pearson Grippe  To Visit:  [id number]    Reason:  ECHO/425.4/SELF/MCLEAN,DALTON  Appt Comments ------------- -- 12/25/09 11:06: ([facility]) CANCELED -- ECHO/425.4/SELF/MCLEAN,DALTON -- 11/11/09 12:12: ([facility]) BOOKED -- Routine CARDIOLOGY ANCILLARY VISIT at 01/16/2010 10:30 AM for 60 min ECHO/425.4/SELF/MCLEAN,DALTON                                                                                                                             DR. OFFICE CX PT HAD ECHO DONE 12/29/09 @10 :30

## 2010-08-04 NOTE — Cardiovascular Report (Signed)
Summary: Office Visit   Office Visit   Imported By: Roderic Ovens 06/09/2010 16:22:09  _____________________________________________________________________  External Attachment:    Type:   Image     Comment:   External Document

## 2010-08-04 NOTE — Procedures (Signed)
Summary: wound check   Current Medications (verified): 1)  Aspir-Trin 325 Mg Tbec (Aspirin) .... One Daily 2)  Enalapril Maleate 5 Mg Tabs (Enalapril Maleate) .... One Twice A Day 3)  Crestor 20 Mg Tabs (Rosuvastatin Calcium) .... One Tablet Daily 4)  Spironolactone 25 Mg Tabs (Spironolactone) .... One By Mouth Daily 5)  Carvedilol 6.25 Mg Tabs (Carvedilol) .... One By Mouth Two Times A Day 6)  Amiodarone Hcl 200 Mg Tabs (Amiodarone Hcl) .... 2 By Mouth Two Times A Day For 2 Weeks Then 2 Daily 7)  Caltrate 600+d Plus 600-400 Mg-Unit Tabs (Calcium Carbonate-Vit D-Min) .... One By Mouth Two Times A Day  Allergies (verified): 1)  ! Pcn   ICD Specifications Following MD:  Sherryl Manges, MD     ICD Vendor:  Medtronic     ICD Model Number:  [id number]     ICD Serial Number:  [id number] ICD DOI:  01/02/2010     ICD Implanting MD:  Sherryl Manges, MD  Lead 1:    Location: RA     DOI: 01/02/2010     Model #: [id number]     Serial #: [id number]     Status: active Lead 2:    Location: RV     DOI: 01/02/2010     Model #: 6213     Serial #: [id number]     Status: active  Indications::  VT   ICD Follow Up Battery Voltage:  3.23 V     Charge Time:  8.8 seconds     Underlying rhythm:  SR   ICD Device Measurements Atrium:  Amplitude: 3.5 mV, Impedance: 551 ohms, Threshold: 0.50 V at 0.40 msec Right Ventricle:  Amplitude: 8.0 mV, Impedance: 418 ohms, Threshold: 0.50 V at 0.40 msec Shock Impedance: 43/55 ohms   Episodes MS Episodes:  0     Shock:  0     ATP:  0     Nonsustained:  0     Atrial Therapies:  0 Atrial Pacing:  5.3%     Ventricular Pacing:  <0.1%  Brady Parameters Mode MVP     Lower Rate Limit:  60     Upper Rate Limit 130 PAV 180     Sensed AV Delay:  150  Tachy Zones VF:  222     VT:  188-222     VT1:  OFF     Atrial Zones:  171 (MONITOR) Next Cardiology Appt Due:  04/06/2010 Tech Comments:  While reviewing medications the patient was taking 25mg  Coreg two times a day.  Discharge  instructions read 6.25 mg by mouth two times a day.   Ms. Neidlinger is aware of the error and she will now take as directed.Altha Harm, LPN  January 14, 2010 11:18 AM    WOUND CHECK--STERI STRIPS REMOVED--NO REDNESS OR SWELLING AT SITE. NORMAL DEVICE FUNCTION.  ROV IN 3 MTHS W/SK. Vella Kohler  January 14, 2010 11:34 AM

## 2010-08-04 NOTE — Progress Notes (Signed)
Summary: pt cant afford meds  Phone Note Call from Patient Call back at Home Phone [phone] Message from:  Patient  Refills Requested: Medication #1:  CRESTOR 20 MG TABS one tablet daily Reason for Call: Talk to Nurse, Talk to Doctor Summary of Call: pt is on CRESTOR 20 MG TABS one tablet daily but she can't afford it and wants to know if she can take something else because Pharm told her there is no generic for this medication Initial call taken by: Omer Jack,  December 15, 2009 9:01 AM  Follow-up for Phone Call        pt has three pilss left and can not afford to keep taking.  Can she switch to something she can afford?  I let her know that Thurston Hole and Dr Shirlee Latch are here tomorrow and I will foward to her for them to review. Dennis Bast, RN, BSN  December 15, 2009 9:42 AM pt is able to get Crestor through [facility] but the pharmacy hours had changed and she was having problems getting there to get her medication-she will make arrangements to get to [facility] pharmacy to get the Crestor--she verbalizes the importance of continuing Crestor and I have stressed to her that Dr Shirlee Latch prefers that she take Crestor and not another statin    Prescriptions: CRESTOR 20 MG TABS (ROSUVASTATIN CALCIUM) one tablet daily  #30 x 6   Entered by:   Katina Dung, RN, BSN   Authorized by:   Marca Ancona, MD   Signed by:   Katina Dung, RN, BSN on 12/16/2009   Method used:   Faxed to ...       [facility] (retail)       [address] [place], [place]  [address]       Ph: [phone]       Fax: [phone]   RxID:   [id number]

## 2010-08-04 NOTE — Cardiovascular Report (Signed)
Summary: Office Visit   Office Visit   Imported By: Roderic Ovens 02/25/2010 15:19:05  _____________________________________________________________________  External Attachment:    Type:   Image     Comment:   External Document

## 2010-08-04 NOTE — Procedures (Signed)
Summary: [facility]   Allergies: 1)  ! Pcn   ICD Specifications Following MD:  Sherryl Manges, MD     ICD Vendor:  Medtronic     ICD Model Number:  [id number]     ICD Serial Number:  [id number] ICD DOI:  01/02/2010     ICD Implanting MD:  Sherryl Manges, MD  Lead 1:    Location: RA     DOI: 01/02/2010     Model #: [id number]     Serial #: [id number]     Status: active Lead 2:    Location: RV     DOI: 01/02/2010     Model #: [id number]     Serial #: [id number]     Status: active  Indications::  VT   ICD Follow Up Remote Check?  No Battery Voltage:  3.21 V     Charge Time:  8.8 seconds     Underlying rhythm:  SR ICD Dependent:  No       ICD Device Measurements Atrium:  Amplitude: 2.5 mV, Impedance: 513 ohms, Threshold: 0.5 V at 0.4 msec Right Ventricle:  Amplitude: 5.5 mV, Impedance: 399 ohms, Threshold: 1.0 V at 0.4 msec Shock Impedance: 42/54 ohms   Episodes MS Episodes:  0     Percent Mode Switch:  0     Coumadin:  No Shock:  0     ATP:  0     Nonsustained:  0     Atrial Pacing:  0.7%     Ventricular Pacing:  0.2%  Brady Parameters Mode MVP     Lower Rate Limit:  60     Upper Rate Limit 130 PAV 180     Sensed AV Delay:  150  Tachy Zones VF:  222     VT:  188-222     VT1:  OFF     Atrial Zones:  171 (MONITOR) Tech Comments:  Ms. Nhem was interrogated today at the request of Dr. Shirlee Latch to review her optivol.  Optivol and thoracic impedance abnormal ongoing since 05/15/10.  She'll follow up as scheduled in the clinic in January. Altha Harm, LPN  June 03, 2010 9:20 AM

## 2010-08-04 NOTE — Assessment & Plan Note (Signed)
Summary: dcmc 8/14/ok per anne/saf   Primary Rawlin Reaume:  Dr. Julien Girt   History of Present Illness: 65 yo with history of cardiomyopathy s/p ICD, and CAD s/p CABG presents for followup after recent hospitalization.  Patient had a left heart cath in 4/11 which showed a 75% ostial left main stenosis.  She had CABG x 2.  She has moderate to severe LV systolic dysfunction that I think is a mixed picture, due to both CAD and probably ETOH abuse.  Since her CABG, she has quit smoking and quit drinking ETOH. She had syncope and episodes of VT on monitor, so Medtronic ICD was placed in 7/11.    At last appointment, she was hypotensive, orthostatic, and dehydrated.  Creatinine was 12.3.  She was admitted to [facility] for rehydration.  Her house has no air conditioning and it had been extremely hot.  The combination of poor by mouth intake, sweating in the heat, and ACEI use appears to have led to the ARF.  Creatinine was down to 0.7 at discharge and she was gently restarted on her cardiac medications.  She has gained 13 lbs since last appointment.  I did start her back on a low dose of Lasix given weight gain and increased BNP.    She is feeling well now.  No lightheadedness with standing.  BP is 100/62 today.  She does not have any exertional dyspnea though she is not very active.  No chest pain.    ECG: NSR, old inferior MI, lateral and anterior T wave inversions.   Labs (4/11): K 4.2, creatinine 0.6, BNP 81, INR 1.5, TSH normal, ANA negative, SPEP negative Labs (5/11): BNP 175, K 4.5, creatinine 0.6, HCT 35.8 Labs (6/11): LDL 80, HDL 52,  Labs (7/11): K 4.3, creatinine 0.5, LFTs normal, BNP 166 Labs (8/11): creatinine 12.3=>0.96=>0.7, K 3.8, BNP 425  Current Medications (verified): 1)  Aspir-Trin 325 Mg Tbec (Aspirin) .... One Daily 2)  Crestor 20 Mg Tabs (Rosuvastatin Calcium) .... One Tablet Daily 3)  Carvedilol 6.25 Mg Tabs (Carvedilol) .... 1/2 By Mouth Two Times A Day 4)  Caltrate  600+d Plus 600-400 Mg-Unit Tabs (Calcium Carbonate-Vit D-Min) .... One By Mouth Two Times A Day 5)  Nitrostat 0.4 Mg Subl (Nitroglycerin) .[name].. 1 Tablet Under Tongue At Onset of Chest Pain; You May Repeat Every 5 Minutes For Up To 3 Doses. 6)  Furosemide 20 Mg Tabs (Furosemide) .... Take One Tablet By Mouth Daily 7)  Potassium Chloride 20 Meq Pack (Potassium Chloride) .... Take 1/2 Tablet By Mouth Daily 8)  Hydralazine Hcl 25 Mg Tabs (Hydralazine Hcl) .[name].. 1 By Mouth Three Times A Day 9)  Imdur 30 Mg Xr24h-Tab (Isosorbide Mononitrate) .[name].. 1 By Mouth Daily  Allergies (verified): 1)  ! Pcn  Past History:  Past Medical History: 1. HTN 2. ETOH abuse: Elevated AST, mildly elevated INR (? component of cirrhosis).  3. Prior 4. Hyperlipidemia 5. Nephrolithiasis 6. Cardiomyopathy: Echo (4/11) with EF 25%, diffuse hypokinesis, moderate LV dilation, decreased RV systolic function but normal RV size, PA systolic pressure 35 mmHg. Lexiscan myoview (4/11): EF 29%, diffuse hypokinesis, basal anteroseptal and apical scar, no ischemia.  The cardiomyopathy is likely due to a combination of CAD (LM stenosis) and ETOH abuse.  ANA and SPEP were negative.  Echo (6/11): EF 25%, mild diastolic dysfunction, normal RV, no pulmonary HTN 7. VT: Dual chamber Medtronic ICD placed 7/11 8. CAD: LHC (4/11) showed 75% ostial LM stenosis with significant damping upon catheter engagement,  60-70% mLAD, 60% ostial D1, EF 30%.  Patient had CABG with LIMA-LAD and SVG-OM2.  9. carotid stenosis: carotid dopplers (4/11) with 60-79% LICA stenosis 10.  Syncope 11.  ARF in setting of dehydration with ACEI use.   Family History: Reviewed history from 10/08/2009 and no changes required. Sister with heart surgery, she is not sure why.   Social History: Reviewed history from 11/11/2009 and no changes required. 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 Married, lives in [place] Quit smoking 4/11 Prior moderate ETOH use (2 liquor  drinks daily) but quit in 3/11.  No drug use and negative urine drug screen in 2/11  Review of Systems       All systems reviewed and negative except as per HPI.   Vital Signs:  Patient profile:   65 year old female Height:      71.5 inches Weight:      146 pounds BMI:     20.15 Pulse rate:   88 / minute Resp:     16 per minute BP sitting:   100 / 62  (right arm)  Vitals Entered By: Marrion Coy, CNA (February 26, 2010 8:52 AM)  Physical Exam  General:  Well developed, well nourished, in no acute distress. Thin.  Neck:  Neck supple, no JVD. No masses, thyromegaly or abnormal cervical nodes. Lungs:  Clear bilaterally to auscultation and percussion. Heart:  Non-displaced PMI, chest non-tender; regular rate and rhythm, S1, S2 without murmurs, rubs. +S4. Carotid upstroke normal, no bruit.  Pedals normal pulses. No edema, no varicosities. Abdomen:  Bowel sounds positive; abdomen soft and non-tender without masses, organomegaly, or hernias noted. No hepatosplenomegaly. Extremities:  No clubbing or cyanosis. Neurologic:  Alert and oriented x 3. Psych:  Normal affect.    ICD Specifications Following MD:  Sherryl Manges, MD     ICD Vendor:  Medtronic     ICD Model Number:  [id number]     ICD Serial Number:  [id number] ICD DOI:  01/02/2010     ICD Implanting MD:  Sherryl Manges, MD  Lead 1:    Location: RA     DOI: 01/02/2010     Model #: [id number]     Serial #: [id number]     Status: active Lead 2:    Location: RV     DOI: 01/02/2010     Model #: [id number]     Serial #: [id number]     Status: active  Indications::  VT   Huston Foley Parameters Mode MVP     Lower Rate Limit:  60     Upper Rate Limit 130 PAV 180     Sensed AV Delay:  150  Tachy Zones VF:  222     VT:  188-222     VT1:  OFF     Atrial Zones:  171 (MONITOR)  Impression & Recommendations:  Problem # 1:  CARDIOMYOPATHY (ICD-425.4) Probably due both to CAD and ETOH abuse.  She has quit drinking and has had CABG.  Volume status looks good  today.  She has gained 13 lbs and BNP is up to 425, so she was restarted on a low dose of Lasix (20 mg daily).  NYHA class II symptoms.  No further orthostatic symptoms.  - Continue lasix for now but will check BMET and BNP today.  - Decrease hydralazine to 12.5 mg three times a day.  - Restart enalapril 2.5 mg two times a day with BMET in 2 wks (nephrology consult thought that  restarting low dose ACEI would be reasonable after recovery of creatinine).   - Continue current dose of Coreg for now with plans to increase when she returns.   Problem # 2:  CAROTID ARTERY STENOSIS (ICD-433.10) Needs carotid dopplers in 10/11 to followup.   Problem # 3:  CORONARY ATHEROSLERO UNSPEC TYPE BYPASS GRAFT (ICD-414.05) Stable without exertional chest pain.  She will continue ASA and Crestor.  Recent lipids looked ok.   Other Orders: TLB-BNP (B-Natriuretic Peptide) (83880-BNPR) TLB-BMP (Basic Metabolic Panel-BMET) (80048-METABOL)  Patient Instructions: 1)  Your physician has recommended you make the following change in your medication: 2)  Decrease Hydralazine to 12.5mg  three times a day--this will be one-half of a 25mg  tablet three times a day. 3)  Start Enalapril 2.5mg  twice a day--you can take one-half of a 5mg  tablet twice a day. 4)  Your physician recommends that you have lab work today--BMP/BNP  5)  Your physician recommends that you return for lab work in: 2 weeks--BMP 428.32 6)  Your physician recommends that you schedule a follow-up appointment in: 6 weeks with Dr Shirlee Latch 7)  Your physician has requested that you have a carotid duplex. This test is an ultrasound of the carotid arteries in your neck. It looks at blood flow through these arteries that supply the brain with blood. Allow one hour for this exam. There are no restrictions or special instructions. This is scheduled for October 11 at 9AM. Prescriptions: ENALAPRIL MALEATE 2.5 MG TABS (ENALAPRIL MALEATE) one twice a day  #60 x 11   Entered  by:   Katina Dung, RN, BSN   Authorized by:   Marca Ancona, MD   Signed by:   Katina Dung, RN, BSN on 02/26/2010   Method used:   Faxed to ...       [facility] (retail)       [address] [place], [place]  [address]       Ph: [phone]       Fax: [phone]   RxID:   [id number]

## 2010-08-04 NOTE — Letter (Signed)
Summary: ER Notification  Architectural technologist, Main Office  [address]   [place], [place] [address]   Phone: [phone]  Fax: [phone]    December 31, 2009 4:39 PM  Dorothy Li  The above referenced patient has been advised to report directly to the Emergency Room. Please see below for more information:  Dx: V-Tach on holter/ EF 25% on echo from 12/29/09- pt has reported pre-syncope at her last office visit.     Private Vehicle  _____x__________ or EMS:  ________________   Orders:  Yes ______ or No  __x_____   Notify upon arrival:     Trish [phone]       Thank you,    Architectural technologist Staff

## 2010-08-04 NOTE — Assessment & Plan Note (Signed)
Summary: pc2/per dr Graciela Husbands   Visit Type:  Follow-up Primary Provider:  Dr. Carolyne Fiscal, [facility]   History of Present Illness: Dorothy Li is seen with a history of coronary disease with prior bypass surgery left ventricular dysfunction felt to be somewhat out of proportion to her coronary disease and ventricular tachycardia with syncope. She subsequent underwent ICD implantation in 7/11.  The patient denies SOB, chest pain, edema or palpitations    Dopplers of her neck today showed60-79% sstenosi on the left    Labs (8/11): creatinine 12.3=>0.96=>0.7, K 3.8, BNP 425  Current Medications (verified): 1)  Aspir-Trin 325 Mg Tbec (Aspirin) .... One Daily 2)  Crestor 20 Mg Tabs (Rosuvastatin Calcium) .... One Tablet Daily 3)  Carvedilol 6.25 Mg Tabs (Carvedilol) .... 1/2  By Mouth Two Times A Day 4)  Calcium With Vitamin D 1200/1000 .... One By Mouth Daily 5)  Nitrostat 0.4 Mg Subl (Nitroglycerin) .[name].. 1 Tablet Under Tongue At Onset of Chest Pain; You May Repeat Every 5 Minutes For Up To 3 Doses. 6)  Furosemide 20 Mg Tabs (Furosemide) .... Take 1/2  Tablet By Mouth Daily. 7)  Potassium Chloride 20 Meq Pack (Potassium Chloride) .... Take 1/2 Tablet By Mouth Daily 8)  Imdur 30 Mg Xr24h-Tab (Isosorbide Mononitrate) .[name].. 1 By Mouth Daily 9)  Enalapril Maleate 2.5 Mg Tabs (Enalapril Maleate) .... One Twice A Day  Allergies: 1)  ! Pcn  Past History:  Past Medical History: Last updated: 02/26/2010 1. HTN 2. ETOH abuse: Elevated AST, mildly elevated INR (? component of cirrhosis).  3. Prior 4. Hyperlipidemia 5. Nephrolithiasis 6. Cardiomyopathy: Echo (4/11) with EF 25%, diffuse hypokinesis, moderate LV dilation, decreased RV systolic function but normal RV size, PA systolic pressure 35 mmHg. Lexiscan myoview (4/11): EF 29%, diffuse hypokinesis, basal anteroseptal and apical scar, no ischemia.  The cardiomyopathy is likely due to a combination of CAD (LM stenosis) and ETOH abuse.  ANA and SPEP  were negative.  Echo (6/11): EF 25%, mild diastolic dysfunction, normal RV, no pulmonary HTN 7. VT: Dual chamber Medtronic ICD placed 7/11 8. CAD: LHC (4/11) showed 75% ostial LM stenosis with significant damping upon catheter engagement, 60-70% mLAD, 60% ostial D1, EF 30%.  Patient had CABG with LIMA-LAD and SVG-OM2.  9. carotid stenosis: carotid dopplers (4/11) with 60-79% LICA stenosis 10.  Syncope 11.  ARF in setting of dehydration with ACEI use.   Vital Signs:  Patient profile:   64 year old female Height:      71.5 inches Weight:      151 pounds Pulse rate:   71 / minute BP sitting:   110 / 64  (left arm)  Vitals Entered By: Laurance Flatten CMA (April 14, 2010 10:13 AM)  Physical Exam  General:  The patient was alert and oriented in no acute distress. HEENT Normal.  Neck veins were flat, carotids were brisk and no bruit was appreciated Lungs were clear.  Heart sounds were regular without murmurs or gallops.  Abdomen was soft with active bowel sounds. There is no clubbing cyanosis or edema. Skin Warm and dry pacer pocket will healed    ICD Specifications Following MD:  Sherryl Manges, MD     ICD Vendor:  Medtronic     ICD Model Number:  [id number]     ICD Serial Number:  [id number] ICD DOI:  01/02/2010     ICD Implanting MD:  Sherryl Manges, MD  Lead 1:    Location: RA     DOI: 01/02/2010  Model #: [id number]     Serial #: [id number]     Status: active Lead 2:    Location: RV     DOI: 01/02/2010     Model #: [id number]     Serial #: [id number]     Status: active  Indications::  VT   ICD Follow Up Battery Voltage:  3.22 V     Charge Time:  8.8 seconds     Underlying rhythm:  SR   ICD Device Measurements Atrium:  Amplitude: 2.3 mV, Impedance: 513 ohms, Threshold: 0.50 V at 0.4 msec Right Ventricle:  Amplitude: 5.8 mV, Impedance: 361 ohms, Threshold: 0.75 V at 0.40 msec Shock Impedance: 41/54 ohms   Episodes MS Episodes:  0     Shock:  0     ATP:  0     Nonsustained:  0     Atrial  Therapies:  0 Atrial Pacing:  1.4%     Ventricular Pacing:  <0.1%  Brady Parameters Mode MVP     Lower Rate Limit:  60     Upper Rate Limit 130 PAV 180     Sensed AV Delay:  150  Tachy Zones VF:  222     VT:  188-222     VT1:  OFF     Atrial Zones:  171 (MONITOR) Next Cardiology Appt Due:  07/06/2010 Tech Comments:  OPTIVOL INCREASE FROM 02-16-10 AND ONGOING.NORMAL DEVICE FUNCTION.  NO EPISODES SINCE LAST CHECK.  CHANGED RA OUTPUT FROM 3.50 TO 2.00 AND RV OUTPUT FROM 3.50 TO 2.50 V.  CHANGED MAX LEAD IMPEDANCE FOR LIA.  PT PREFERS OV INSTEAD OF CARELINK DUE TO COST.  PT DOESN'T HAVE LANDLINE AND WOULD COST TO PT. Dorothy Li  April 14, 2010 10:26 AM  Impression & Recommendations:  Problem # 1:  VENTRICULAR TACHYCARDIA (ICD-427.1) no recurrent VT Her updated medication list for this problem includes:    Aspir-trin 325 Mg Tbec (Aspirin) ..... One daily    Carvedilol 6.25 Mg Tabs (Carvedilol) .[name]... 1/2  by mouth two times a day    Nitrostat 0.4 Mg Subl (Nitroglycerin) .[name]... 1 tablet under tongue at onset of chest pain; you may repeat every 5 minutes for up to 3 doses.    Imdur 30 Mg Xr24h-tab (Isosorbide mononitrate) .[name]... 1 by mouth daily    Enalapril Maleate 2.5 Mg Tabs (Enalapril maleate) ..... One twice a day  Problem # 2:  CHRONIC SYSTOLIC HEART FAILURE (ICD-428.22) stable my symptoms; however, her optivol index is very high. The impedance slope is however positive. She is to see Dr. Shirlee Latch next week. We will plan to recheck her optivol index at that time.  Problem # 3:  SYNCOPE (ICD-780.2) no recurrent syncope Her updated medication list for this problem includes:    Aspir-trin 325 Mg Tbec (Aspirin) ..... One daily    Carvedilol 6.25 Mg Tabs (Carvedilol) .[name]... 1/2  by mouth two times a day    Nitrostat 0.4 Mg Subl (Nitroglycerin) .[name]... 1 tablet under tongue at onset of chest pain; you may repeat every 5 minutes for up to 3 doses.    Imdur 30 Mg Xr24h-tab (Isosorbide  mononitrate) .[name]... 1 by mouth daily    Enalapril Maleate 2.5 Mg Tabs (Enalapril maleate) ..... One twice a day  Problem # 4:  CORONARY ATHEROSLERO UNSPEC TYPE BYPASS GRAFT (ICD-414.05) without chest pain continue current medications;  I will defer to Dr. DM up titration of her carvedilol at her next visit Her updated medication list  for this problem includes:    Aspir-trin 325 Mg Tbec (Aspirin) ..... One daily    Carvedilol 6.25 Mg Tabs (Carvedilol) .[name]... 1/2  by mouth two times a day    Nitrostat 0.4 Mg Subl (Nitroglycerin) .[name]... 1 tablet under tongue at onset of chest pain; you may repeat every 5 minutes for up to 3 doses.    Imdur 30 Mg Xr24h-tab (Isosorbide mononitrate) .[name]... 1 by mouth daily    Enalapril Maleate 2.5 Mg Tabs (Enalapril maleate) ..... One twice a day  Patient Instructions: 1)  Your physician recommends that you schedule a follow-up appointment in: 3 months 2)  Your physician recommends that you continue on your current medications as directed. Please refer to the Current Medication list given to you today.

## 2010-08-04 NOTE — Assessment & Plan Note (Signed)
Summary: rov   Primary Provider:  Dr. Carolyne Fiscal, [facility]   History of Present Illness: 65 yo with history of HTN and smoking initially presented for evaluation of presyncopal spells.  For the last 6 months, patient has been having episodes where she feels like a shade comes down over her eyes and she gets lightheaded.  She has never passed out completely or fallen.  Episodes will last 1-2 minutes and occur once or twice a month.  They can occur when she is walking or when she is sitting down.   They are not associated with palpitations, racing heart rate, chest pain, nausea, or dyspnea.    I set her up for a 3 week event monitor, which showed one episode of 14 beats NSVT.  This does not appear to have been symptomatic.  I started her on a beta blocker after this was seen.  Echo showed a dilated cardiomyopathy, EF 25%.  This was confirmed by [facility], which showed no ischemia but fixed basal anteroseptal and apical defects.  She has never had chest pain.  She does not have significant exertional dyspnea and can climb steps with no problems.  No orthopnea or PND.  She used to be at least a moderately heavy drinker (at least 2 liquor drinks daily) but quit about a month ago. She denies drug use.  No family history of cardiomyopathy.    Labs (3/11): K 3.8, creatinine 0.6, TGs 58, HDL 129, LDL 104, AST 98, ALT 36 Labs (4/11): K 4.2, creatinine 0.6, BNP 81, INR 1.5  Problems Prior to Update: 1)  Chronic Systolic Heart Failure  (ICD-428.22) 2)  Ventricular Tachycardia  (ICD-427.1) 3)  Hypertension, Unspecified  (ICD-401.9) 4)  Syncope  (ICD-780.2) 5)  Shortness of Breath  (ICD-786.05) 6)  Palpitations  (ICD-785.1)  Current Medications (verified): 1)  Norvasc 5 Mg Tabs (Amlodipine Besylate) .... One Tablet Daily 2)  Aspirin 81 Mg Tbec (Aspirin) .... Take One Tablet By Mouth Daily 3)  Lopressor 50 Mg Tabs (Metoprolol Tartrate) .... One-Half Tablet Twice A Day 4)  Ergocalciferol 50000 Unit Caps  (Ergocalciferol) .... One Tab Once A Wk. On Wednesday's  Allergies: 1)  ! Pcn  Past History:  Past Medical History: 1. HTN 2. ETOH abuse: Elevated AST, mildly elevated INR (? component of cirrhosis).  3. Smoker 4. Hyperlipidemia 5. Nephrolithiasis 6. Cardiomyopathy: Echo (4/11) with EF 25%, diffuse hypokinesis, moderate LV dilation, decreased RV systolic function but normal RV size, PA systolic pressure 35 mmHg. Lexiscan myoview (4/11): EF 29%, diffuse hypokinesis, basal anteroseptal and apical scar, no ischemia.  7. NSVT: NSVT x 1 (14 beats) noted on event monitor in 3/11 done because of presyncopal episodes.  She was not on beta blockers when this was noted.   Family History: Sister with heart surgery, she is not sure why.   Social History: Smokes 1/3 ppd.  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 Married, lives in [place] Prior moderate ETOH use (2 liquor drinks daily) but quit in 3/11.  No drug use and negative urine drug screen in 2/11  Review of Systems       All systems negative except as per HPI.   Vital Signs:  Patient profile:   65 year old female Height:      69 inches Weight:      157 pounds BMI:     23.27 Pulse rate:   101 / minute BP sitting:   141 / 88  (left arm) Cuff size:   regular  Vitals Entered By: Oswald Hillock (October 08, 2009 11:13 AM)  Physical Exam  General:  Well developed, well nourished, in no acute distress. Neck:  Neck supple, no JVD. No masses, thyromegaly or abnormal cervical nodes. Lungs:  Clear bilaterally to auscultation and percussion. Heart:  Non-displaced PMI, chest non-tender; regular rate and rhythm, S1, S2 without murmurs, rubs. +S4. Carotid upstroke normal, no bruit.  Pedals normal pulses. No edema, no varicosities. Abdomen:  Bowel sounds positive; abdomen soft and non-tender without masses, organomegaly, or hernias noted. No hepatosplenomegaly. Extremities:  No clubbing or cyanosis. Neurologic:  Alert and oriented x  3. Psych:  Normal affect.   Impression & Recommendations:  Problem # 1:  CARDIOMYOPATHY (ICD-425.4) Patient was found to have a dilated cardiomyopathy by echo and myoview.  The cause is unclear.  It may be nonischemic given the diffuse nature of LV hypokinesis.  This could be an ETOH cardiomyopathy, especially if the patient drank more heavily than she admits.  I wonder if there is a component of cirrhosis given elevated AST and INR 1.5.  She denies drug abuse.  She is currently minimally symptomatic (NYHA class I-II).   - D/c metoprolol, change to Coreg 6.25 mg two times a day - D/c amlodipine - Start enalapril 5 mg two times a day - Left and right heart cath to assess filling pressures and rule out coronary disease as a cause of cardiomyopathy - Check ANA, SPEP, UPEP, HIV, TSH  Problem # 2:  VENTRICULAR TACHYCARDIA (ICD-427.1) As part of evaluation for cause of presyncopal episodes, 3 week event monitor was done.  There was one 14 beat run of NSVT.  She was not on beta blockade at the time.  No definite symptoms associated with NSVT.  Cannot rule out symptomatic VT as cause of past presyncopal episodes.  She will be started on medical treatment for cardiomyopathy, including a beta blocker.  Repeat echo in a few months and if no improvement, she will need an ICD.   Problem # 3:  SMOKER Patient needs to quit smoking. She is going to start Chantix.   Other Orders: Antinuclear Antib (ANA) [id number]) T-HIV Antibody  (Reflex) [id number]) T- * Misc. Laboratory test ([id number]) T-Antinuclear Antib (ANA) [id number] [phone]) Cardiac Catheterization (Cardiac Cath) TLB-BMP (Basic Metabolic Panel-BMET) (80048-METABOL) TLB-BNP (B-Natriuretic Peptide) (83880-BNPR) TLB-CBC Platelet - w/Differential (85025-CBCD) TLB-PT (Protime) (85610-PTP)  Patient Instructions: 1)  Your physician has recommended you make the following change in your medication:  2)  Stop Metoprolol 3)  Stop Norvasc(amlodipine) 4)   Start Coreg 6.25mg  twice a day 5)  Start Enalapril 5 mg  twice a day 6)  Start Chantix to help you stop smoking. 7)  Lab today--BMP/CBC/PT/BNP/ANA/HIV/SPEP/UPEP 8)  Your physician has requested that you have a cardiac catheterization.  Cardiac catheterization is used to diagnose and/or treat various heart conditions. Doctors may recommend this procedure for a number of different reasons. The most common reason is to evaluate chest pain. Chest pain can be a symptom of coronary artery disease (CAD), and cardiac catheterization can show whether plaque is narrowing or blocking your heart's arteries. This procedure is also used to evaluate the valves, as well as measure the blood flow and oxygen levels in different parts of your heart.  For further information please visit [email or web address]  Please follow instruction sheet, as given. 9)  Your physician recommends that you schedule a follow-up appointment in: 1 month with Dr Shirlee Latch. Prescriptions: ENALAPRIL MALEATE 5 MG TABS (ENALAPRIL MALEATE) one two times a day  #  60 x 6   Entered by:   Katina Dung, RN, BSN   Authorized by:   Marca Ancona, MD   Signed by:   Katina Dung, RN, BSN on 10/08/2009   Method used:   Electronically to        [facility] [phone]* (retail)       [address]       [place], [place]  [address]       Ph: [phone]       Fax: [phone]   RxID:   [id number] CHANTIX CONTINUING MONTH PAK 1 MG TABS (VARENICLINE TARTRATE) take as directed  #1 x 1   Entered by:   Katina Dung, RN, BSN   Authorized by:   Marca Ancona, MD   Signed by:   Katina Dung, RN, BSN on 10/08/2009   Method used:   Electronically to        [facility] [phone]* (retail)       [address]       [place], [place]  [address]       Ph: [phone]       Fax: [phone]   RxID:   [id number] CHANTIX 0.5 MG TABS (VARENICLINE TARTRATE) take as directed  #1 x 0   Entered by:   Katina Dung, RN, BSN   Authorized by:    Marca Ancona, MD   Signed by:   Katina Dung, RN, BSN on 10/08/2009   Method used:   Electronically to        [facility] [phone]* (retail)       [address]       [place], [place]  [address]       Ph: [phone]       Fax: [phone]   RxID:   [id number] ENALAPRIL MALEATE 2.5 MG TABS (ENALAPRIL MALEATE) one tablet twice a day  #60 x 6   Entered by:   Katina Dung, RN, BSN   Authorized by:   Marca Ancona, MD   Signed by:   Katina Dung, RN, BSN on 10/08/2009   Method used:   Electronically to        [facility] [phone]* (retail)       [address]       [place], [place]  [address]       Ph: [phone]       Fax: [phone]   RxID:   [id number] COREG 6.25 MG TABS (CARVEDILOL) one tablet twice a day  #60 x 6   Entered by:   Katina Dung, RN, BSN   Authorized by:   Marca Ancona, MD   Signed by:   Katina Dung, RN, BSN on 10/08/2009   Method used:   Electronically to        [facility] [phone]* (retail)       [address]       [place], [place]  [address]       Ph: [phone]       Fax: [phone]   RxID:   [id number]  I talked with Sameer at [facility] to verify correct dose of Enalapril is 5mg  two times a day Luana Shu

## 2010-08-04 NOTE — Assessment & Plan Note (Signed)
Summary: np6/darkening vision & lightheadness/abn ekg/jml   Primary Provider:  Dr. Julien Girt  CC:  new patient with darkening vision and lightheadedness.  Hx abnormal EKG.  .  History of Present Illness: 65 yo with history of HTN and smoking presents for evaluation of presyncopal spells.  For the last 6 months, patient has been having episodes where she feels like a shade comes down over her eyes and she gets lightheaded.  She has never passed out completely or fallen.  Episodes will last 1-2 minutes and occur once or twice a month.  They can occur when she is walking or when she is sitting down.   They are not associated with palpitations, racing heart rate, chest pain, nausea, or dyspnea.  Patient was not orthostatic today.  She has good exercise capacity with no exertional chest pain or shortness of breath.  BP is very high today => 192/101.  However, looking at the [facility] note, her BP was only 128/77 there.  She says that she is very nervous today.  She is on no meds for BP.   ECG: NSR, anterior T wave inversions  Current Medications (verified): 1)  None  Allergies (verified): 1)  ! Pcn  Past History:  Past Medical History: 1. HTN 2. ETOH abuse 3. Smoker 4. Hyperlipidemia 5. Nephrolithiasis    Family History: No premature coronary disease  Social History: Smokes 1/3 ppd.  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   Vital Signs:  Patient profile:   65 year old female Height:      69 inches Weight:      157 pounds BMI:     23.27 Pulse rate:   114 / minute Pulse (ortho):   118 / minute Pulse rhythm:   regular BP sitting:   192 / 101  (left arm) BP standing:   171 / 100 Cuff size:   regular  Vitals Entered By: Judithe Modest CMA (September 05, 2009 10:51 AM)  Serial Vital Signs/Assessments:  Time      Position  BP       Pulse  Resp  Temp      By 11:12 AM  Lying LA  177/101  106                   Amanda Trulove CMA 11:12 AM  Sitting   178/93   105                   Amanda Trulove CMA 11:12 AM  Standing  171/100  118                   Amanda Trulove CMA  Comments: 11:12 AM 2 minutes-177/106 HR 118 3 minutes-180/102 HR 118 By: Judithe Modest CMA    Physical Exam  General:  Well developed, well nourished, in no acute distress. Head:  normocephalic and atraumatic Nose:  no deformity, discharge, inflammation, or lesions Mouth:  Teeth, gums and palate normal. Oral mucosa normal. Neck:  Neck supple, no JVD. No masses, thyromegaly or abnormal cervical nodes. Lungs:  Clear bilaterally to auscultation and percussion. Heart:  Non-displaced PMI, chest non-tender; regular rate and rhythm, S1, S2 without murmurs, rubs. +S4. Carotid upstroke normal, no bruit.  Pedals normal pulses. No edema, no varicosities. Abdomen:  Bowel sounds positive; abdomen soft and non-tender without masses, organomegaly, or  hernias noted. No hepatosplenomegaly. Msk:  Back normal, normal gait. Muscle strength and tone normal. Extremities:  No clubbing or cyanosis. Neurologic:  Alert and oriented x 3. Skin:  Intact without lesions or rashes. Psych:  anxious.     Impression & Recommendations:  Problem # 1:  PRESYNCOPE Patient has episodes of presyncope both with exertion and when at rest and sitting.  They are relatively infrequent, about twice a month.  She has never actually passed out.  Patient is not orthostatic.  No palpitations.  I will get an echocardiogram.  I will also set her up for a 3 week event monitor to assess for any significant arrhythmias.  She should take aspirin 81 mg daily.   Problem # 2:  HYPERTENSION, UNSPECIFIED (ICD-401.9) BP is quite high today, even when rechecked at the end of the appointment.  I suspect that even if anxiety today is driving her BP, she is likely significantly hypertensive at baseline.  I will start her on amlodipine  5 mg daily.    I will check lipids and CMET.   Other Orders: TLB-BMP (Basic Metabolic Panel-BMET) (80048-METABOL) TLB-Lipid Panel (80061-LIPID) TLB-Hepatic/Liver Function Pnl (80076-HEPATIC) Event (Event) Echocardiogram (Echo)  Patient Instructions: 1)  Your physician recommends that you have  a FASTING lipid profile/liver profile/BMP 401.9 780.2 today 2)  Start Norvasc 5mg  daily 3)  Start Aspirin 81mg  daily--this should be buffered or coated 4)  Your physician has requested that you have an echocardiogram.  Echocardiography is a painless test that uses sound waves to create images of your heart. It provides your doctor with information about the size and shape of your heart and how well your heart's chambers and valves are working.  This procedure takes approximately one hour. There are no restrictions for this procedure. 5)  Your physician has recommended that you wear an event monitor.  Event monitors are medical devices that record the heart's electrical activity. Doctors most often use these monitors to diagnose arrhythmias. Arrhythmias are problems with the speed or rhythm of the heartbeat. The monitor is a small, portable device. You can wear one while you do your normal daily activities. This is usually used to diagnose what is causing palpitations/syncope (passing out). 6)  Take and record your blood pressure--I will call you in 2 weeks to get the readings--Anne Lankford,RN  7)  Your physician recommends that you schedule a follow-up appointment in: 1 month with Dr Shirlee Latch Prescriptions: NORVASC 5 MG TABS (AMLODIPINE BESYLATE) one tablet daily  #30 x 6   Entered by:   Katina Dung, RN, BSN   Authorized by:   Marca Ancona, MD   Signed by:   Katina Dung, RN, BSN on 09/05/2009   Method used:   Faxed to ...       [facility] (retail)       [address] [place], [place]  [address]       Ph: [phone]       Fax: [phone]   RxID:    [id number]

## 2010-08-04 NOTE — Assessment & Plan Note (Signed)
Summary: PER CHECK OUT/SF   Primary Provider:  Dr. Carolyne Fiscal, [facility]  CC:  some chest soreness and low B/P.  History of Present Illness: 65 yo with history of cardiomyopathy and CAD s/p CABG presents for followup after recent CABG.  Patient had a left heart cath in 4/11 which showed a 75% ostial left main stenosis.  She had CABG x 2.  She has moderate to severe LV systolic dysfunction that I think is a mixed picture, due to both CAD and probably ETOH abuse.  Since her CABG, she has quit smoking and quit drinking ETOH.  She is taking Chantix.  She down 10 lbs compared to prior appointment (before CABG).  She has been riding her bike and walking around her house and yard without exertional dyspnea.  No chest pain.    Labs (3/11): K 3.8, creatinine 0.6, TGs 58, HDL 129, LDL 104, AST 98, ALT 36 Labs (4/11): K 4.2, creatinine 0.6, BNP 81, INR 1.5, TSH normal, ANA negative, SPEP negative Labs (5/11): BNP 175, K 4.5, creatinine 0.6, HCT 35.8  ECG: NSR, inferior and anterolateral T wave inversions.   Current Medications (verified): 1)  Aspir-Trin 325 Mg Tbec (Aspirin) .... One Daily 2)  Coreg 25 Mg Tabs (Carvedilol) .... One-Half Tablet Twice A Day 3)  Ergocalciferol 50000 Unit Caps (Ergocalciferol) .... One Tab Once A Wk. On Wednesday's 4)  Enalapril Maleate 5 Mg Tabs (Enalapril Maleate) .... One Two Times A Day 5)  Chantix Continuing Month Pak 1 Mg Tabs (Varenicline Tartrate) .... Take As Directed 6)  Oxycodone Hcl 5 Mg Tabs (Oxycodone Hcl) .... As Needed Pain 7)  Crestor 20 Mg Tabs (Rosuvastatin Calcium) .... One Tablet Daily  Allergies: 1)  ! Pcn  Past History:  Past Medical History: 1. HTN 2. ETOH abuse: Elevated AST, mildly elevated INR (? component of cirrhosis).  3. Smoker 4. Hyperlipidemia 5. Nephrolithiasis 6. Cardiomyopathy: Echo (4/11) with EF 25%, diffuse hypokinesis, moderate LV dilation, decreased RV systolic function but normal RV size, PA systolic pressure 35 mmHg.  Lexiscan myoview (4/11): EF 29%, diffuse hypokinesis, basal anteroseptal and apical scar, no ischemia.  The cardiomyopathy is likely due to a combination of CAD (LM stenosis) and ETOH abuse.  ANA and SPEP were negative.  7. NSVT: NSVT x 1 (14 beats) noted on event monitor in 3/11 done because of presyncopal episodes.  She was not on beta blockers when this was noted.  8. CAD: LHC (4/11) showed 75% ostial LM stenosis with significant damping upon catheter engagement, 60-70% mLAD, 60% ostial D1, EF 30%.  Patient had CABG with LIMA-LAD and SVG-OM2.  9. carotid stenosis: carotid dopplers (4/11) with 60-79% LICA stenosis  Family History: Reviewed history from 10/08/2009 and no changes required. Sister with heart surgery, she is not sure why.   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  No drug use and negative urine drug screen in 2/11  Review of Systems       All systems reviewed and negative except as per HPI.   Vital Signs:  Patient profile:   65 year old female Height:      69 inches Weight:      146.50 pounds Pulse rate:   80 / minute BP sitting:   110 / 68  (right arm)  Vitals Entered By: Katina Dung, RN, BSN (Nov 11, 2009 8:41 AM)  Physical Exam  General:  Well developed, well nourished, in no acute distress. Neck:  Neck supple, no JVD. No masses, thyromegaly or abnormal cervical nodes. Lungs:  Clear bilaterally to auscultation and percussion. Heart:  Non-displaced PMI, chest non-tender; regular rate and rhythm, S1, S2 without murmurs, rubs. +S4. Carotid upstroke normal, no bruit.  Pedals normal pulses. No edema, no varicosities. Abdomen:  Bowel sounds positive; abdomen soft and non-tender without masses, organomegaly, or hernias noted. No hepatosplenomegaly. Extremities:  No clubbing or cyanosis. Neurologic:  Alert and oriented x 3. Psych:   Normal affect.   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  She is doing well post-CABG.  Her BP has run a bit low and Dr. Dorris Fetch decreased her Coreg to 12.5 mg two times a day.  She will continue on her ASA, current Coreg dose, enalapril, and Crestor.  I talked to her about cardiac rehab but she will not be able to afford it, even when pro-rated.  Therefore, she will need to exercise on her own.   Problem # 2:  CAROTID BRUIT (ICD-785.9) Moderate LICA stenosis.  Followup dopplers in 10/11.    Problem # 3:  CARDIOMYOPATHY (ICD-425.4) Probably due both to CAD and ETOH abuse.  She has quit drinking and has had CABG.  Continue Coreg 12.5 mg two times a day and enalapril 5 mg two times a day.  BNP is mildly elevated but she does not appear volume overloaded on exam and symptoms are NYHA class II.  No need for Lasix at this time.  Add spironolactone 12.5 mg qday and check BMET in 2 wks.   Problem # 4:  VENTRICULAR TACHYCARDIA (ICD-427.1) Patient had an episode of NSVT noted prior to CABG.  She is now on Coreg and has had revascularization.  WIll get an echo in 7/11 to recheck LV function post-CABG.  If EF remains < 35, will set her up for an ICD.   Other Orders: EKG w/ Interpretation (93000) Carotid Duplex (Carotid Duplex) Echocardiogram (Echo) TLB-BMP (Basic Metabolic Panel-BMET) (80048-METABOL) TLB-BNP (B-Natriuretic Peptide) (83880-BNPR) TLB-CBC Platelet - w/Differential (85025-CBCD)  Patient Instructions: 1)  Your physician has recommended you make the following change in your medication:  2)  Start Spironolactone 12.5mg  daily--- this will be one-half of a  25mg  daily 3)  Your physician recommends that you have lab work today--BMP/BNP/CBC  414.05 428.32 4)  Your physician recommends that you return for lab work in: 2 weeks BMP 414.05 428.32 5)  Your physician has requested that you have an  echocardiogram.  Echocardiography is a painless test that uses sound waves to create images of your heart. It provides your doctor with information about the size and shape of your heart and how well your heart's chambers and valves are working.  This procedure takes approximately one hour. There are no restrictions for this procedure. JULY 2011 6)  Your physician has requested that you have a carotid duplex. This test is an ultrasound of the carotid arteries in your neck. It looks at blood flow through these arteries that supply the brain with blood. Allow one hour for this exam. There are no restrictions or special instructions. OCTOBER 2011 7)  Your physician recommends that you schedule a follow-up appointment in: 6 weeks with Dr Shirlee Latch Prescriptions: SPIRONOLACTONE 25 MG TABS (SPIRONOLACTONE) Take one-half  tablet by mouth daily  #30 x 3   Entered by:   Katina Dung, RN, BSN   Authorized  by:   Marca Ancona, MD   Signed by:   Katina Dung, RN, BSN on 11/11/2009   Method used:   Electronically to        [facility] [phone]* (retail)       [address]       [place], [place]  [address]       Ph: [phone]       Fax: [phone]   RxID:   [id number]    Vital Signs:  Patient profile:   65 year old female Height:      69 inches Weight:      146.50 pounds Pulse rate:   80 / minute BP sitting:   110 / 68  (right arm)  Vitals Entered By: Katina Dung, RN, BSN (Nov 11, 2009 8:41 AM)

## 2010-08-04 NOTE — Letter (Signed)
Summary: [facility]  [facility]   Imported By: Erle Crocker 12/30/2009 07:53:19  _____________________________________________________________________  External Attachment:    Type:   Image     Comment:   External Document

## 2010-08-04 NOTE — Cardiovascular Report (Signed)
Summary: Office Visit   Office Visit   Imported By: Roderic Ovens 04/22/2010 17:00:30  _____________________________________________________________________  External Attachment:    Type:   Image     Comment:   External Document

## 2010-08-04 NOTE — Assessment & Plan Note (Signed)
Summary: per check out/sf   Visit Type:  Hosp / Follow-up Primary Provider:  Dr. Carolyne Fiscal, [facility]  CC:  shortness of breath and chest pain.  History of Present Illness: 65 yo with history of cardiomyopathy and CAD s/p CABG presents for followup after ICD placement.  Patient had a left heart cath in 4/11 which showed a 75% ostial left main stenosis.  She had CABG x 2.  She has moderate to severe LV systolic dysfunction that I think is a mixed picture, due to both CAD and probably ETOH abuse.  Since her CABG, she has quit smoking and quit drinking ETOH. She had syncope and episodes of VT on monitor, so Medtronic ICD was placed in 7/11.  She was started on amiodarone the plan to take amiodarone for a total of 6 weeks.  After this, she was doing well until about 4 days ago.  At that time, she began to note extreme gait instability.  She felt "wobbly" when walking.  She tripped and fell once in her house.  No syncope.  She denies feeling lightheaded (just "wobbly").  She also reports visual blurring and disturbance that began today.  We checked her BP in the office, it was in the 70s/50s on repetitive measures.  She was extremely unstable on her feet and required the use of a walker.  No ICD discharges.  No exertional dyspnea.  No chest pain.  She has lost another 9 lbs since last appointment.  Regarding the weight loss, she has not been on a diuretic other than spironolactone.  She had a mammogram done this year which turned out ok.  No history of screening colonoscopy.    Labs (3/11): K 3.8, creatinine 0.6, TGs 58, HDL 129, LDL 104, AST 98, ALT 36 Labs (4/11): K 4.2, creatinine 0.6, BNP 81, INR 1.5, TSH normal, ANA negative, SPEP negative Labs (5/11): BNP 175, K 4.5, creatinine 0.6, HCT 35.8 Labs (11/26/09): K 4.1, creatinine 0.6 Labs (6/11): LDL 80, HDL 52,  Labs (7/11): K 4.3, creatinine 0.5, LFTs normal, BNP 166  Current Medications (verified): 1)  Aspir-Trin 325 Mg Tbec (Aspirin) .... One  Daily 2)  Enalapril Maleate 5 Mg Tabs (Enalapril Maleate) .... One Twice A Day 3)  Crestor 20 Mg Tabs (Rosuvastatin Calcium) .... One Tablet Daily 4)  Spironolactone 25 Mg Tabs (Spironolactone) .... One By Mouth Daily 5)  Carvedilol 6.25 Mg Tabs (Carvedilol) .... One By Mouth Two Times A Day 6)  Amiodarone Hcl 200 Mg Tabs (Amiodarone Hcl) .... 2 By Mouth 1 Times A Day 7)  Caltrate 600+d Plus 600-400 Mg-Unit Tabs (Calcium Carbonate-Vit D-Min) .... One By Mouth Two Times A Day 8)  Nitrostat 0.4 Mg Subl (Nitroglycerin) .[name].. 1 Tablet Under Tongue At Onset of Chest Pain; You May Repeat Every 5 Minutes For Up To 3 Doses.  Allergies (verified): 1)  ! Pcn  Past History:  Past Medical History: 1. HTN 2. ETOH abuse: Elevated AST, mildly elevated INR (? component of cirrhosis).  3. Prior 4. Hyperlipidemia 5. Nephrolithiasis 6. Cardiomyopathy: Echo (4/11) with EF 25%, diffuse hypokinesis, moderate LV dilation, decreased RV systolic function but normal RV size, PA systolic pressure 35 mmHg. Lexiscan myoview (4/11): EF 29%, diffuse hypokinesis, basal anteroseptal and apical scar, no ischemia.  The cardiomyopathy is likely due to a combination of CAD (LM stenosis) and ETOH abuse.  ANA and SPEP were negative.  Echo (6/11): EF 25%, mild diastolic dysfunction, normal RV, no pulmonary HTN 7. VT: Dual chamber Medtronic ICD  placed 7/11 8. CAD: LHC (4/11) showed 75% ostial LM stenosis with significant damping upon catheter engagement, 60-70% mLAD, 60% ostial D1, EF 30%.  Patient had CABG with LIMA-LAD and SVG-OM2.  9. carotid stenosis: carotid dopplers (4/11) with 60-79% LICA stenosis 10.  Syncope  Family History: Reviewed history from 10/08/2009 and no changes required. Sister with heart surgery, she is not sure why.   Social History: Reviewed history from 11/11/2009 and no changes required. 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 Married, lives in [place] Quit smoking 4/11 Prior moderate ETOH use  (2 liquor drinks daily) but quit in 3/11.  No drug use and negative urine drug screen in 2/11  Review of Systems       All systems reviewed and negative except as per HPI.   Vital Signs:  Patient profile:   65 year old female Weight:      133 pounds BMI:     18.36 Pulse rate:   72 / minute BP sitting:   70 / 56  (right arm) Cuff size:   regular  Vitals Entered By: Caralee Ates CMA (February 11, 2010 8:49 AM)  Physical Exam  General:  Well developed, well nourished, in no acute distress. Thin.  Neck:  Neck supple, no JVD. No masses, thyromegaly or abnormal cervical nodes. Lungs:  Clear bilaterally to auscultation and percussion. Heart:  Non-displaced PMI, chest non-tender; regular rate and rhythm, S1, S2 without murmurs, rubs. +S4. Carotid upstroke normal, no bruit.  Pedals normal pulses. No edema, no varicosities. Abdomen:  Bowel sounds positive; abdomen soft and non-tender without masses, organomegaly, or hernias noted. No hepatosplenomegaly. Extremities:  No clubbing or cyanosis. Neurologic:  Alert and oriented x 3.  Very unstable on her feet (unable to walk without assistance).  Psych:  Normal affect.    ICD Specifications Following MD:  Sherryl Manges, MD     ICD Vendor:  Medtronic     ICD Model Number:  [id number]     ICD Serial Number:  [id number] ICD DOI:  01/02/2010     ICD Implanting MD:  Sherryl Manges, MD  Lead 1:    Location: RA     DOI: 01/02/2010     Model #: 1191     Serial #: [id number]     Status: active Lead 2:    Location: RV     DOI: 01/02/2010     Model #: 0865     Serial #: [id number]     Status: active  Indications::  VT   ICD Follow Up Battery Voltage:  3.22 V     Charge Time:  8.8 seconds     Underlying rhythm:  SR   ICD Device Measurements Atrium:  Amplitude: 3.9 mV, Impedance: 570 ohms, Threshold: 0.50 V at 0.40 msec Right Ventricle:  Amplitude: 6.3 mV, Impedance: 494 ohms, Threshold: 1.25 V at 0.40 msec Shock Impedance: 57/78 ohms   Episodes MS  Episodes:  0     Shock:  0     ATP:  0     Nonsustained:  0     Atrial Therapies:  0 Atrial Pacing:  10.6%     Ventricular Pacing:  <0.1%  Brady Parameters Mode MVP     Lower Rate Limit:  60     Upper Rate Limit 130 PAV 180     Sensed AV Delay:  150  Tachy Zones VF:  222     VT:  188-222     VT1:  OFF  Atrial Zones:  171 (MONITOR) Next Cardiology Appt Due:  04/06/2010 Tech Comments:  PT SEEING DR [facility] FOR DIZZINESS--NORMAL DEVICE FUNCTION.  NO EPISODES SINCE LAST CHECK.  TURNED ON 1:1 SVT AND CHANGED MAX RV PACING LEAD IMPEDANCES TO 1000 ohms.  ROV IN OCT W/SK. Vella Kohler  February 11, 2010 10:06 AM  Impression & Recommendations:  Problem # 1:  HYPOTENSION Blood pressure is very low this morning.  Patient is unable to walk without assistance and needs a wheelchair.  I suspect that her gait instability and visual disturbance is from the hypotension, though it is certainly possible that a neurological effect of amiodarone contributes.  I am going to admit her to the hospital given her symptomatic hypotension. I will have her stop her BP-active meds for now (Coreg, enalapril, spironolactone, and amiodarone).  I think she can stop the amiodarone for good at this point.  We will check CBC for white count, CXR, and UA to assess for infection as cause of her hypotension.  We will get am fasting cortisol.  I will give her gentle IV fluid hydration (she is not volume overloaded on exam).  If the gait instability remains after BP improves, will need head CT and neurology consult.   Problem # 2:  CAROTID ARTERY STENOSIS (ICD-433.10) Needs carotid dopplers in 10/11 to followup.   Problem # 3:  CARDIOMYOPATHY (ICD-425.4) Probably due both to CAD and ETOH abuse.  She has quit drinking and has had CABG.  She is not volume overloaded on exam but is hypotensive.  As above, will d/c BP-active meds for now and admit.  Will give gentle IV fluid hydration.  She has not been on diuretics.   Problem # 4:   CORONARY ATHEROSLERO UNSPEC TYPE BYPASS GRAFT (ICD-414.05) Stable without exertional chest pain.  She will continue ASA and Crestor.  Recent lipids looked ok.   Problem # 5:  VENTRICULAR TACHYCARDIA (ICD-427.1) Has Medtronic ICD.  Checked this morning, no events.   Other Orders: EKG w/ Interpretation (93000) Carotid Duplex (Carotid Duplex)  Patient Instructions: 1)  Your physician recommends that you schedule a follow-up appointment in: 1 month with Dr Shirlee Latch. 2)  Dr Graciela Husbands October 2011. 3)  Carotid Doppler 2011. 4)  Be sure to followup with optometrist.

## 2010-08-04 NOTE — Cardiovascular Report (Signed)
Summary: CDC Pre-Cath Orders  CDC Pre-Cath Orders   Imported By: Earl Many 10/10/2009 17:10:44  _____________________________________________________________________  External Attachment:    Type:   Image     Comment:   External Document

## 2010-08-04 NOTE — Progress Notes (Signed)
Summary: Samples Crestor  Phone Note Call from Patient Call back at Home Phone [phone]   Caller: Patient Summary of Call: Pt need samples of Crestor need four pills waiting on prescription to come Initial call taken by: Judie Grieve,  January 21, 2010 9:18 AM  Follow-up for Phone Call        LM on voice  ID voice mail for pt--left samples of Crestor 20mg  #7 at desk for her to pick up

## 2010-08-04 NOTE — Letter (Signed)
Summary: [facility] Clinical Record  [facility] Clinical Record   Imported By: Roderic Ovens 10/27/2009 11:11:36  _____________________________________________________________________  External Attachment:    Type:   Image     Comment:   External Document

## 2010-08-04 NOTE — Progress Notes (Signed)
  Request recieved from [facility] sent to [facility]. Dorothy Li  January 27, 2010 8:27 AM

## 2010-08-04 NOTE — Letter (Signed)
Summary: Cardiac Catheterization Instructions- [facility]  [facility], Main Office  [address]   [place], [place] [address]   Phone: [phone]  Fax: [phone]     10/08/2009 MRN: [id number]  Dorothy Li [address] [place], [place]  [address]  Dear Ms. Dorothy Li, Dorothy Li are scheduled for a Cardiac Catheterization on Tuesday April 12,2011 with Dr. Marca Ancona.  Please arrive to the 1st floor of the Heart and Vascular Center at [facility] at 9:30 am  on the day of your procedure. Please do not arrive before 6:30 a.m. Call the Heart and Vascular Center at [phone] if you are unable to make your appointmnet. The Code to get into the parking garage under the building is 0001. Take the elevators to the 1st floor. You must have someone to drive you home. Someone must be with you for the first 24 hours after you arrive home. Please wear clothes that are easy to get on and off and wear slip-on shoes. Do not eat or drink after midnight except water with your medications that morning. Bring all your medications and current insurance cards with you.    _x__ Make sure you take your aspirin.  _x__ You may take ALL of your medications with water that morning. ________________________________________________________________________________________________________________________________   The usual length of stay after your procedure is 2 to 3 hours. This can vary.  If you have any questions, please call the office at the number listed above.   Katina Dung, RN, BSN

## 2010-08-04 NOTE — Progress Notes (Signed)
Summary: Calling regarding B/P being low  and medication  Phone Note Call from Patient Call back at Home Phone [phone]   Caller: Patient Summary of Call:  call regarding low B/P right 83/51 left 94/52 pt have question about medication Hydralazine Initial call taken by: Judie Grieve,  March 02, 2010 8:31 AM  Follow-up for Phone Call        talked with pt by telephone--saw Dr Carolyne Fiscal on Saturday and was told B/P was on the low side    New/Updated Medications: CARVEDILOL 6.25 MG TABS (CARVEDILOL) 1  by mouth two times a day * CALCIUM WITH VITAMIN D 1200/1000 one by mouth daily   Current Medications (verified): 1)  Aspir-Trin 325 Mg Tbec (Aspirin) .... One Daily 2)  Crestor 20 Mg Tabs (Rosuvastatin Calcium) .... One Tablet Daily 3)  Carvedilol 6.25 Mg Tabs (Carvedilol) .[name].. 1  By Mouth Two Times A Day 4)  Calcium With Vitamin D 1200/1000 .... One By Mouth Daily 5)  Nitrostat 0.4 Mg Subl (Nitroglycerin) .[name].. 1 Tablet Under Tongue At Onset of Chest Pain; You May Repeat Every 5 Minutes For Up To 3 Doses. 6)  Furosemide 20 Mg Tabs (Furosemide) .... Take One Tablet By Mouth Daily 7)  Potassium Chloride 20 Meq Pack (Potassium Chloride) .... Take 1/2 Tablet By Mouth Daily 8)  Hydralazine Hcl 25 Mg Tabs (Hydralazine Hcl) .... One -Half By Mouth Three Times A Day 9)  Imdur 30 Mg Xr24h-Tab (Isosorbide Mononitrate) .[name].. 1 By Mouth Daily 10)  Enalapril Maleate 2.5 Mg Tabs (Enalapril Maleate) .... One Twice A Day  Allergies: 1)  ! Pcn  pt states she has not checked B/P since these readings at Dr Hinton Dyer office on Saturday-right arm 83/51--left arm 94/52--pt denies any dizziness or lightheadedness-pt states Dr Carolyne Fiscal suggested stopping Hydralazine and Furosemide--pt also states she has been taking Coreg 6.25mg  twice a day not 3.125mg  twice a day that was listed on her med list from 02/26/10--I will review with Dr Shirlee Latch  reviewed with Dr Almon Hercules to hold Hydralazine --keep a check on her  B/P --call if she develops lightheadedness or dizziness --

## 2010-08-04 NOTE — Cardiovascular Report (Signed)
Summary: Patient Implant Record   Patient Implant Record   Imported By: Roderic Ovens 01/22/2010 10:25:38  _____________________________________________________________________  External Attachment:    Type:   Image     Comment:   External Document

## 2010-08-04 NOTE — Assessment & Plan Note (Signed)
Summary: PER CHECK OUT/SF   Primary Provider:  Dr. Carolyne Fiscal, [facility]   History of Present Illness: 65 yo with history of cardiomyopathy and CAD s/p CABG presents for followup after recent CABG.  Patient had a left heart cath in 4/11 which showed a 75% ostial left main stenosis.  She had CABG x 2.  She has moderate to severe LV systolic dysfunction that I think is a mixed picture, due to both CAD and probably ETOH abuse.  Since her CABG, she has quit smoking and quit drinking ETOH.  She is taking Chantix.  She has lost another 4 lbs.  She has been riding her bike and walking around her house and yard without exertional dyspnea.  No chest pain.  Main problem has been "falling spells."  With these spells, she gets a prodrome of leg tightening and visual darkening while walking and will pass out and fall.  She has had 2-3 of these episodes since CABG, last being about 1 week ago.  About once weekly, she will have a presyncopal episode while standing that will not result in complete loss of consciousness or fall.  She denies palpitations or a sense of her heart racing.  She does not typically get lightheaded, however, when she stands from sitting or lying and walks around.   Patient was not orthostatic when we checked today.   However, her BP is low at 88/60.   Labs (3/11): K 3.8, creatinine 0.6, TGs 58, HDL 129, LDL 104, AST 98, ALT 36 Labs (4/11): K 4.2, creatinine 0.6, BNP 81, INR 1.5, TSH normal, ANA negative, SPEP negative Labs (5/11): BNP 175, K 4.5, creatinine 0.6, HCT 35.8 Labs (11/26/09): K 4.1, creatinine 0.6  Current Medications (verified): 1)  Aspir-Trin 325 Mg Tbec (Aspirin) .... One Daily 2)  Coreg 25 Mg Tabs (Carvedilol) .... One-Half Tablet Twice A Day 3)  Ergocalciferol 50000 Unit Caps (Ergocalciferol) .... One Tab Once A Wk. On Wednesday's 4)  Enalapril Maleate 5 Mg Tabs (Enalapril Maleate) .... One Two Times A Day 5)  Chantix Continuing Month Pak 1 Mg Tabs (Varenicline Tartrate)  .... Take As Directed 6)  Crestor 20 Mg Tabs (Rosuvastatin Calcium) .... One Tablet Daily 7)  Spironolactone 25 Mg Tabs (Spironolactone) .... Take One-Half  Tablet By Mouth Daily  Allergies: 1)  ! Pcn  Past History:  Past Medical History: 1. HTN 2. ETOH abuse: Elevated AST, mildly elevated INR (? component of cirrhosis).  3. Smoker 4. Hyperlipidemia 5. Nephrolithiasis 6. Cardiomyopathy: Echo (4/11) with EF 25%, diffuse hypokinesis, moderate LV dilation, decreased RV systolic function but normal RV size, PA systolic pressure 35 mmHg. Lexiscan myoview (4/11): EF 29%, diffuse hypokinesis, basal anteroseptal and apical scar, no ischemia.  The cardiomyopathy is likely due to a combination of CAD (LM stenosis) and ETOH abuse.  ANA and SPEP were negative.  7. NSVT: NSVT x 1 (14 beats) noted on event monitor in 3/11 done because of presyncopal episodes.  She was not on beta blockers when this was noted.  8. CAD: LHC (4/11) showed 75% ostial LM stenosis with significant damping upon catheter engagement, 60-70% mLAD, 60% ostial D1, EF 30%.  Patient had CABG with LIMA-LAD and SVG-OM2.  9. carotid stenosis: carotid dopplers (4/11) with 60-79% LICA stenosis 10.  Syncope  Family History: Reviewed history from 10/08/2009 and no changes required. Sister with heart surgery, she is not sure why.   Social History: Reviewed history from 11/11/2009 and no changes required. 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  No drug use and negative urine drug screen in 2/11  Review of Systems       All systems reviewed and negative except as per HPI.   Vital Signs:  Patient profile:   65 year old female Height:      71.5 inches Weight:      142 pounds BMI:     19.60 Pulse rate:   100 / minute Pulse rhythm:   regular BP sitting:   88 / 60  (left arm) Cuff size:   regular  Vitals Entered  By: Stanton Kidney, EMT-P (December 24, 2009 9:13 AM)  Serial Vital Signs/Assessments:  Time      Position  BP       Pulse  Resp  Temp     By 09:54               90/63    93                    Stanton Kidney, EMT-P 09:55               [phone]    [address] EMT-P 09:56               [phone]    106                   Stanton Kidney, EMT-P 09:58               92/67    108                   Stanton Kidney, EMT-P 10:01 AM            85/67    108                   Stanton Kidney, EMT-P  Comments: 09:54 Lying (L) Arm: No symptoms By: Stanton Kidney, EMT-P  09:55 Sitting (L) Arm: felt a little SOB By: Stanton Kidney, EMT-P  09:56 Standing 0 min (L) Arm: Face felt tight By: Stanton Kidney, EMT-P  09:58 Standing 2 min (L) Arm: No symptoms, NL By: Stanton Kidney, EMT-P  10:01 AM Standing 5 min (L) Arm: No symptoms By: Stanton Kidney, EMT-P    Physical Exam  General:  Well developed, well nourished, in no acute distress. Neck:  Neck supple, no JVD. No masses, thyromegaly or abnormal cervical nodes. Lungs:  Clear bilaterally to auscultation and percussion. Heart:  Non-displaced PMI, chest non-tender; regular rate and rhythm, S1, S2 without murmurs, rubs. +S4. Carotid upstroke normal, no bruit.  Pedals normal pulses. No edema, no varicosities. Abdomen:  Bowel sounds positive; abdomen soft and non-tender without masses, organomegaly, or hernias noted. No hepatosplenomegaly. Extremities:  No clubbing or cyanosis. Neurologic:  Alert and oriented x 3. Psych:  Normal affect.   Impression & Recommendations:  Problem # 1:  CORONARY ATHEROSLERO UNSPEC TYPE BYPASS GRAFT (ICD-414.05) Stable without exertional chest pain.  She will continue ASA, Coreg, ACEI, and Crestor.   Problem # 2:  CARDIOMYOPATHY (ICD-425.4) Probably due both to CAD and ETOH abuse.  She has quit drinking and has had CABG.  Continue Coreg 12.5 mg two times a day and spironolactone.  Givne low BP and ?  orthostatic symptoms, will  decrease enalapril to 2.5 mg two times a day.  She is not volume overloaded on exam and is no longer taking Lasix.  No significant exertional dyspnea.    Problem # 3:  SYNCOPE (ICD-780.2) Patient has had 2-3 syncopal episodes and a number of presyncopal episodes since CABG.  All have occurred while the patient has been standing or walking.  Her BP is low but she is not orthostatic.  She is also not tyically lightheaded with standing (only occasionally).  Question here is whether the symptoms are due to an arrhythmia (VT) or due to orthostasis/hypotension.  She does have a history of NSVT (noted prior to CABG and prior to use of beta blocker).   - Decrease enalapril to 2.5 mg daily to help raise BP.  Will keep Coreg at same dose for now (do not want to decrease beta blocker when there is a question of ventricular arrhythmias).   - Holter monitor to assess for ventricular arrhythmias - Echo (almost 3 months post-CABG).  If EF is < 35%, will go ahead with ICD.    Problem # 4:  CAROTID BRUIT (ICD-785.9) Moderate LICA stenosis.  Followup dopplers in 10/11.    Other Orders: Echocardiogram (Echo) Holter Monitor (Holter Monitor)  Patient Instructions: 1)  Your physician has recommended you make the following change in your medication:  2)  Decrease Enalapril to 2.5mg  twice a day--you can take one-half of a 5mg  tablet twice a day 3)  Your physician has requested that you have an echocardiogram.  Echocardiography is a painless test that uses sound waves to create images of your heart. It provides your doctor with information about the size and shape of your heart and how well your heart's chambers and valves are working.  This procedure takes approximately one hour. There are no restrictions for this procedure.  THIS NEEDS TO BE ASAP 4)  Your physician has recommended that you wear a holter monitor.  Holter monitors are medical devices that record the heart's electrical activity. Doctors most often use  these monitors to diagnose arrhythmias. Arrhythmias are problems with the speed or rhythm of the heartbeat. The monitor is a small, portable device. You can wear one while you do your normal daily activities. This is usually used to diagnose what is causing palpitations/syncope (passing out). 5)  Your physician recommends that you return for a FASTING lipid profile/liver profile  414.05   428.22--This can be when you have the echocardiogram. 6)  Your physician recommends that you schedule a follow-up appointment in: 2 months with Dr Shirlee Latch. Prescriptions: ENALAPRIL MALEATE 2.5 MG TABS (ENALAPRIL MALEATE) one twice a day  #60 x 11   Entered by:   Katina Dung, RN, BSN   Authorized by:   Marca Ancona, MD   Signed by:   Katina Dung, RN, BSN on 12/24/2009   Method used:   Faxed to ...       [facility] (retail)       [address] [place], [place]  [address]       Ph: [phone]       Fax: [phone]   RxID:   [id number]

## 2010-08-04 NOTE — Assessment & Plan Note (Signed)
Summary: per check out/sf   needs optivol check   Visit Type:    Follow-up Primary Provider:  Dr. Clelia Croft, [place]   History of Present Illness: 65 yo with history of cardiomyopathy s/p ICD, and CAD s/p CABG presents for followup.   Patient had a left heart cath in 4/11 which showed a 75% ostial left main stenosis.  She had CABG x 2.  She has moderate to severe LV systolic dysfunction that I think is a mixed picture, due to both CAD and probably ETOH abuse.  Since her CABG, she has quit smoking and quit drinking ETOH. She had syncope and episodes of VT on monitor, so Medtronic ICD was placed in 7/11.    Since last appointment, she has been doing well symptomatically.  She is able to walk to [employer] without dyspnea and can walk up a flight of steps.  No orthopnea or PND.  She was taken off hydralazine by her PCP (? because of low BP, she is not sure).  No chest pain.  Weight is up 1 lb since last appointment.   Optivol was checked today: fluid index is high and impedance is trending down.  Labs (8/11): BNP 181 Labs (9/11): K 4.4, creatinine 0.6   Current Medications (verified): 1)  Aspir-Trin 325 Mg Tbec (Aspirin) .... One Daily 2)  Crestor 20 Mg Tabs (Rosuvastatin Calcium) .... One Tablet Daily 3)  Carvedilol 6.25 Mg Tabs (Carvedilol) .[name].. 1  By Mouth Two Times A Day 4)  Calcium With Vitamin D 1200/1000 .... 1/2 Two Times A Day 5)  Nitrostat 0.4 Mg Subl (Nitroglycerin) .[name].. 1 Tablet Under Tongue At Onset of Chest Pain; You May Repeat Every 5 Minutes For Up To 3 Doses. 6)  Potassium Chloride Cr 10 Meq Cr-Caps (Potassium Chloride) .... Take One Tablet By Mouth Daily 7)  Imdur 30 Mg Xr24h-Tab (Isosorbide Mononitrate) .[name].. 1 By Mouth Daily 8)  Enalapril Maleate 2.5 Mg Tabs (Enalapril Maleate) .... One Twice A Day  Allergies: 1)  ! Pcn  Past History:  Past Medical History: 1. HTN 2. ETOH abuse: Elevated AST, mildly elevated INR (? component of cirrhosis).  3. Prior 4.  Hyperlipidemia 5. Nephrolithiasis 6. Cardiomyopathy: Echo (4/11) with EF 25%, diffuse hypokinesis, moderate LV dilation, decreased RV systolic function but normal RV size, PA systolic pressure 35 mmHg. Lexiscan myoview (4/11): EF 29%, diffuse hypokinesis, basal anteroseptal and apical scar, no ischemia.  The cardiomyopathy is likely due to a combination of CAD (LM stenosis) and ETOH abuse.  ANA and SPEP were negative.  Echo (6/11): EF 25%, mild diastolic dysfunction, normal RV, no pulmonary HTN 7. VT: Dual chamber Medtronic ICD placed 7/11 8. CAD: LHC (4/11) showed 75% ostial LM stenosis with significant damping upon catheter engagement, 60-70% mLAD, 60% ostial D1, EF 30%.  Patient had CABG with LIMA-LAD and SVG-OM2.  9. carotid stenosis: carotid dopplers (4/11) with 60-79% LICA stenosis.  Carotid dopplers (10/11): 60-79% LICA stenosis.   10.  Syncope 11.  ARF in setting of dehydration with ACEI use.   Family History: Reviewed history from 10/08/2009 and no changes required. Sister with heart surgery, she is not sure why.   Social History: Reviewed history from 11/11/2009 and no changes required. 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 Married, lives in [place] Quit smoking 4/11 Prior moderate ETOH use (2 liquor drinks daily) but quit in 3/11.  No drug use and negative urine drug screen in 2/11  Review of Systems  All systems reviewed and negative except as per HPI.   Vital Signs:  Patient profile:   65 year old female Height:      71.5 inches Weight:      147 pounds Pulse rate:   84 / minute BP sitting:   110 / 60  (left arm)  Vitals Entered By: Laurance Flatten CMA (April 21, 2010 8:50 AM)  Physical Exam  General:  Well developed, well nourished, in no acute distress. Neck:  Neck supple, no JVD. No masses, thyromegaly or abnormal cervical nodes. Lungs:  Clear bilaterally to auscultation and percussion. Heart:  Non-displaced PMI, chest non-tender; regular rate and  rhythm, S1, S2 without murmurs, rubs. +S4. Carotid upstroke normal, no bruit.  Pedals normal pulses. No edema, no varicosities. Abdomen:  Bowel sounds positive; abdomen soft and non-tender without masses, organomegaly, or hernias noted. No hepatosplenomegaly. Extremities:  No clubbing or cyanosis. Neurologic:  Alert and oriented x 3. Psych:  Normal affect.    ICD Specifications Following MD:  Sherryl Manges, MD     ICD Vendor:  Medtronic     ICD Model Number:  [id number]     ICD Serial Number:  [id number] ICD DOI:  01/02/2010     ICD Implanting MD:  Sherryl Manges, MD  Lead 1:    Location: RA     DOI: 01/02/2010     Model #: [id number]     Serial #: [id number]     Status: active Lead 2:    Location: RV     DOI: 01/02/2010     Model #: [id number]     Serial #: [id number]     Status: active  Indications::  VT   ICD Follow Up Battery Voltage:  3.21 V     Charge Time:  8.8 seconds     Underlying rhythm:  SR   ICD Device Measurements Atrium:  Impedance: 513 ohms,  Right Ventricle:  Impedance: 399 ohms,  Shock Impedance: 41/55 ohms   Brady Parameters Mode MVP     Lower Rate Limit:  60     Upper Rate Limit 130 PAV 180     Sensed AV Delay:  150  Tachy Zones VF:  222     VT:  188-222     VT1:  OFF     Atrial Zones:  171 (MONITOR) Tech Comments:  OPTIVOL CHECK---OPTIVOL STILL HIGH AS OF 02-16-10.  THORACIC IMPEDANCE HAS INCREASED A LITTLE FROM AUGUST.  PT HAS NO SWELLING OR NO EXCESSIVE WEIGHT GAIN. Vella Kohler  April 21, 2010 9:11 AM  Impression & Recommendations:  Problem # 1:  CARDIOMYOPATHY (ICD-425.4) Probably due both to CAD and ETOH abuse.  She has quit drinking and has had CABG.   NYHA class II symptoms.  Optivol fluid index is high and impedance is trending down, suggesting volume is trending up.  Interestingly, she does not look overloaded on exam and her symptoms are stable and minimal.   - Continue current Coreg and enalapril.   - Start spironolactone 12.5 mg daily - Start low dose  Lasix 20 mg daily + KCl 10 mEq daily.  - BNP today - BMET in 2 weeks - Followup in 6 wks.   Problem # 2:  CAROTID ARTERY STENOSIS (ICD-433.10) Stable 60-79% LICA stenosis.  Followup dopplers in 6 months.   Problem # 3:  CORONARY ATHEROSLERO UNSPEC TYPE BYPASS GRAFT (ICD-414.05) Stable with no chest pain.  Continue current meds.  Goal LDL < 70 with Crestor.   Other  Orders: TLB-BNP (B-Natriuretic Peptide) (83880-BNPR)  Patient Instructions: 1)  Your physician recommends that you schedule a follow-up appointment in: 6 weeks 2)  Your physician recommends that you return for lab work in: BNP today--BNP and BMET in 2 weeks 3)  Your physician has recommended you make the following change in your medication: d/c imdur--start lasix 20mg  everyday--increase KCL to everyday --start spironolactone 12.5 mg everyday 4)  Your physician has requested that you have a carotid duplex. This test is an ultrasound of the carotid arteries in your neck. It looks at blood flow through these arteries that supply the brain with blood. Allow one hour for this exam. There are no restrictions or special instructions.--please schedule for 6weeks Prescriptions: SPIRONOLACTONE 25 MG TABS (SPIRONOLACTONE) take 1/2 tablet everyday  #15 x 10   Entered by:   Ledon Snare, RN   Authorized by:   Marca Ancona, MD   Signed by:   Marca Ancona, MD on 04/21/2010   Method used:   Faxed to ...       [facility] - [facility] (retail)       [address] [place], [place]  [address]       Ph: [phone]       Fax: [phone]   RxID:   [id number] FUROSEMIDE 20 MG TABS (FUROSEMIDE) take 1 tablet everyday  #30 x 10   Entered by:   Ledon Snare, RN   Authorized by:   Marca Ancona, MD   Signed by:   Marca Ancona, MD on 04/21/2010   Method used:   Faxed to ...       [facility] - Pharmac (retail)       [address] [place], [place]  [address]        Ph: [phone]       Fax: [phone]   RxID:   [id number] SPIRONOLACTONE 25 MG TABS (SPIRONOLACTONE) take 1/2 tablet everyday  #15 x 6   Entered by:   Ledon Snare, RN   Authorized by:   Marca Ancona, MD   Signed by:   Marca Ancona, MD on 04/21/2010   Method used:   Faxed to ...       [facility] - Pharmac (retail)       [address] [place], [place]  [address]       Ph: [phone]       Fax: [phone]   RxID:   [id number] SPIRONOLACTONE 25 MG TABS (SPIRONOLACTONE) take 1/2 tablet everyday  #15 x 6   Entered by:   Ledon Snare, RN   Authorized by:   Marca Ancona, MD   Signed by:   Ledon Snare, RN on 04/21/2010   Method used:   Printed then faxed to ...       [facility] - Pharmac (retail)       [address] [place], [place]  [address]       Ph: [phone]       Fax: [phone]   RxID:   [id number] FUROSEMIDE 20 MG TABS (FUROSEMIDE) take 1 tablet everyday  #30 x 6   Entered by:   Ledon Snare, RN   Authorized by:   Marca Ancona, MD   Signed by:   Ledon Snare, RN on 04/21/2010   Method used:   Printed  then faxed to ...       [facility] - Pharmac (retail)       [address] [place], [place]  [address]       Ph: [phone]       Fax: [phone]   RxID:   [id number]

## 2010-08-04 NOTE — Progress Notes (Signed)
Summary: refill  Phone Note Refill Request Call back at Home Phone [phone] Message from:  Patient on Dec 02, 2009 11:58 AM  Refills Requested: Medication #1:  CHANTIX CONTINUING MONTH PAK 1 MG TABS take as directed [facility] [address]    Method Requested: Fax to Local Pharmacy Initial call taken by: Lorne Skeens,  Dec 02, 2009 11:59 AM  Follow-up for Phone Call        talked with Nida Boatman at [facility] pharmacy--pt has refill of Chantix at [facility] aware

## 2010-08-04 NOTE — Progress Notes (Signed)
Summary: Monitor results   Phone Note Outgoing Call   Call placed by: Sherri Rad, RN, BSN,  December 31, 2009 4:01 PM Call placed to: Patient Summary of Call: The pt's holter monitor was reviewed by Dr. Ladona Ridgel- shows NSVT. The pt has had problems with pre-syncope and an EF of 25% on her echo from 6/27. Per Dr. Ladona Ridgel, the pt needs to go the ER at [facility] for evaluation. I have left a message for the pt to call at her home #. I have attempted her work #, but have been advised she no longer works there.  Initial call taken by: Sherri Rad, RN, BSN,  December 31, 2009 4:02 PM  Follow-up for Phone Call        returning call, Migdalia Dk  December 31, 2009 4:13 PM   I have spoken with the pt. She will report to the [facility] ER. She verbalizes understanding of the importance that she go ASAP. She will have her husband drive her there. I will notify the ER. Trish aware.  Follow-up by: Sherri Rad, RN, BSN,  December 31, 2009 4:38 PM

## 2010-08-04 NOTE — Progress Notes (Signed)
  DDS request Received sent to [facility]  April 29, 2010 9:12 AM

## 2010-08-04 NOTE — Miscellaneous (Signed)
Summary: Device preload  Clinical Lists Changes  Observations: Added new observation of ICD INDICATN: VT (01/06/2010 15:07) Added new observation of ICDLEADSTAT2: active (01/06/2010 15:07) Added new observation of ICDLEADSER2: [id number] V (01/06/2010 15:07) Added new observation of ICDLEADMOD2: 6947  (01/06/2010 15:07) Added new observation of ICDLEADLOC2: RV  (01/06/2010 15:07) Added new observation of ICDLEADSTAT1: active  (01/06/2010 15:07) Added new observation of ICDLEADSER1: [id number]  (01/06/2010 15:07) Added new observation of ICDLEADMOD1: 5076  (01/06/2010 15:07) Added new observation of ICDLEADLOC1: RA  (01/06/2010 15:07) Added new observation of ICD IMP MD: Sherryl Manges, MD  (01/06/2010 15:07) Added new observation of ICDLEADDOI2: 01/02/2010  (01/06/2010 15:07) Added new observation of ICDLEADDOI1: 01/02/2010  (01/06/2010 15:07) Added new observation of ICD IMPL DTE: 01/02/2010  (01/06/2010 15:07) Added new observation of ICD SERL#: [id number] H  (01/06/2010 15:07) Added new observation of ICD MODL#: D314DRG  (01/06/2010 15:07) Added new observation of ICDMANUFACTR: Medtronic  (01/06/2010 15:07) Added new observation of ICD MD: Sherryl Manges, MD  (01/06/2010 15:07)       ICD Specifications Following MD:  Sherryl Manges, MD     ICD Vendor:  Medtronic     ICD Model Number:  [id number]     ICD Serial Number:  [id number] ICD DOI:  01/02/2010     ICD Implanting MD:  Sherryl Manges, MD  Lead 1:    Location: RA     DOI: 01/02/2010     Model #: 2952     Serial #: [id number]     Status: active Lead 2:    Location: RV     DOI: 01/02/2010     Model #: 2725     Serial #: [id number]     Status: active  Indications::  VT

## 2010-08-04 NOTE — Procedures (Signed)
Summary: summary report  summary report   Imported By: Mirna Mires 01/01/2010 15:35:42  _____________________________________________________________________  External Attachment:    Type:   Image     Comment:   External Document

## 2010-08-04 NOTE — Progress Notes (Signed)
Summary: returning call  Phone Note Call from Patient Call back at Home Phone [phone]   Caller: Patient Summary of Call: returning call to [facility] Initial call taken by: Migdalia Dk,  October 31, 2009 10:56 AM  Follow-up for Phone Call        Called and spoke to patient regarding chantix and notified that pharmacy was called and chantix continuing pack was authorized.  Asked her to call pharmacy to make sure they had it filled before she went in.   Follow-up by: Judithe Modest CMA,  October 31, 2009 11:59 AM

## 2010-08-05 ENCOUNTER — Encounter: Payer: Self-pay | Admitting: Cardiology

## 2010-08-06 NOTE — Procedures (Signed)
Summary: DEVICE CHECK/SL   Current Medications (verified): 1)  Aspir-Trin 325 Mg Tbec (Aspirin) .... One Daily 2)  Crestor 40 Mg Tabs (Rosuvastatin Calcium) .... One Daily 3)  Carvedilol 6.25 Mg Tabs (Carvedilol) .[name].. 1  By Mouth Two Times A Day 4)  Calcium With Vitamin D 600/400mg  .... One By Mouth Two Times A Day 5)  Nitrostat 0.4 Mg Subl (Nitroglycerin) .[name].. 1 Tablet Under Tongue At Onset of Chest Pain; You May Repeat Every 5 Minutes For Up To 3 Doses. 6)  Potassium Chloride Cr 10 Meq Cr-Caps (Potassium Chloride) .... Take One Tablet By Mouth Daily 7)  Enalapril Maleate 5 Mg Tabs (Enalapril Maleate) .... One Twice A Day 8)  Furosemide 20 Mg Tabs (Furosemide) .... Take 1 Tablet Everyday 9)  Spironolactone 25 Mg Tabs (Spironolactone) .... Take 1/2 Tablet Everyday 10)  Gas Relief 100 Mg Chew (Simethicone) .... One By Mouth Two Times A Day  Allergies (verified): 1)  ! Pcn   ICD Specifications Following MD:  Sherryl Manges, MD     ICD Vendor:  Medtronic     ICD Model Number:  [id number]     ICD Serial Number:  [id number] ICD DOI:  01/02/2010     ICD Implanting MD:  Sherryl Manges, MD  Lead 1:    Location: RA     DOI: 01/02/2010     Model #: 4098     Serial #: [id number]     Status: active Lead 2:    Location: RV     DOI: 01/02/2010     Model #: 6213     Serial #: [id number]     Status: active  Indications::  VT   ICD Follow Up Battery Voltage:  3.19 V     Charge Time:  8.7 seconds     Underlying rhythm:  SR @93  ICD Dependent:  No       ICD Device Measurements Atrium:  Amplitude: 2.6 mV, Impedance: 513 ohms, Threshold: 0.50 V at 0.40 msec Right Ventricle:  Amplitude: 4.6 mV, Impedance: 361 ohms, Threshold: 1.00 V at 0.40 msec Shock Impedance: 44/57 ohms   Episodes MS Episodes:  0     Coumadin:  No Shock:  0     ATP:  0     Nonsustained:  0     Atrial Therapies:  0 Atrial Pacing:  0.4%     Ventricular Pacing:  0.2%  Brady Parameters Mode MVP     Lower Rate Limit:  60     Upper Rate  Limit 130 PAV 180     Sensed AV Delay:  150  Tachy Zones VF:  222     VT:  188-222     VT1:  OFF     Atrial Zones:  171 (MONITOR) Next Cardiology Appt Due:  10/05/2010 Tech Comments:  NORMAL DEVICE FUNCTION.  NO EPISODES SINCE LAST CHECK. OPTIVOL STABLE.  NO CHANGES MADE. ROV IN 3 MTHS W/DEVICE CLINIC. Vella Kohler  July 15, 2010 5:00 PM

## 2010-08-06 NOTE — Miscellaneous (Signed)
Summary: Orders Update  Clinical Lists Changes  Problems: Added new problem of PURE HYPERCHOLESTEROLEMIA (ICD-272.0) Added new problem of LONG-TERM (CURRENT) USE OF OTHER MEDICATIONS (ICD-V58.69) Orders: Added new Test order of TLB-Lipid Panel (80061-LIPID) - Signed Added new Test order of TLB-Hepatic/Liver Function Pnl (80076-HEPATIC) - Signed

## 2010-08-06 NOTE — Miscellaneous (Signed)
Summary: Orders Update  Clinical Lists Changes  Orders: Added new Test order of TLB-BMP (Basic Metabolic Panel-BMET) (80048-METABOL) - Signed Added new Test order of TLB-CBC Platelet - w/Differential (85025-CBCD) - Signed Added new Test order of TLB-Lipid Panel (80061-LIPID) - Signed Added new Test order of TLB-Hepatic/Liver Function Pnl (80076-HEPATIC) - Signed Added new Test order of TLB-PT (Protime) (85610-PTP) - Signed

## 2010-08-12 NOTE — Letter (Signed)
Summary: Custom - Lipid  [facility], Main Office  [address]   [place], [place] [address]   Phone: [phone]  Fax: [phone]     August 05, 2010 MRN: [id number]   Dorothy Li [address] [place], [place]  [address]   Dear Ms. [name] Section,  Dr Shirlee Latch has  reviewed your cholesterol results.  They are as follows:     Total Cholesterol:    125 (Desirable: less than 200)       HDL  Cholesterol:     41.00  (Desirable: greater than 40 for men and 50 for women)       LDL Cholesterol:       69  (Desirable: less than 100 for low risk and less than 70 for moderate to high risk)       Triglycerides:       75.0  (Desirable: less than 150)  His  recommendations include: cholesterol is good! No new recommendations.   Call our office at the number listed above if you have any questions.  Lowering your LDL cholesterol is important, but it is only one of a large number of "risk factors" that may indicate that you are at risk for heart disease, stroke or other complications of hardening of the arteries.  Other risk factors include:   A.  Cigarette Smoking* B.  High Blood Pressure* C.  Obesity* D.   Low HDL Cholesterol (see yours above)* E.   Diabetes Mellitus (higher risk if your is uncontrolled) F.  Family history of premature heart disease G.  Previous history of stroke or cardiovascular disease    *These are risk factors YOU HAVE CONTROL OVER.  For more information, visit .  There is now evidence that lowering the TOTAL CHOLESTEROL AND LDL CHOLESTEROL can reduce the risk of heart disease.  The American Heart Association recommends the following guidelines for the treatment of elevated cholesterol:  1.  If there is now current heart disease and less than two risk factors, TOTAL CHOLESTEROL should be less than 200 and LDL CHOLESTEROL should be less than 100. 2.  If there is current heart disease or two or more risk factors, TOTAL CHOLESTEROL should be less than 200 and  LDL CHOLESTEROL should be less than 70.  A diet low in cholesterol, saturated fat, and calories is the cornerstone of treatment for elevated cholesterol.  Cessation of smoking and exercise are also important in the management of elevated cholesterol and preventing vascular disease.  Studies have shown that 30 to 60 minutes of physical activity most days can help lower blood pressure, lower cholesterol, and keep your weight at a healthy level.  Drug therapy is used when cholesterol levels do not respond to therapeutic lifestyle changes (smoking cessation, diet, and exercise) and remains unacceptably high.  If medication is started, it is important to have you levels checked periodically to evaluate the need for further treatment options.  Thank you,    Katina Dung, RN

## 2010-08-12 NOTE — Cardiovascular Report (Signed)
Summary: Office Visit   Office Visit   Imported By: Roderic Ovens 08/05/2010 14:03:40  _____________________________________________________________________  External Attachment:    Type:   Image     Comment:   External Document

## 2010-08-21 ENCOUNTER — Telehealth: Payer: Self-pay | Admitting: Cardiology

## 2010-08-26 NOTE — Progress Notes (Signed)
Summary: refill/will run out in March  Phone Note Refill Request Message from:  Patient on August 21, 2010 8:47 AM  Refills Requested: Medication #1:  CARVEDILOL 6.25 MG TABS 1  by mouth two times a day  Medication #2:  CRESTOR 40 MG TABS one daily  Medication #3:  POTASSIUM CHLORIDE CR 10 MEQ CR-CAPS Take one tablet by mouth daily [facility]  Initial call taken by: Judie Grieve,  August 21, 2010 8:48 AM    Prescriptions: POTASSIUM CHLORIDE CR 10 MEQ CR-CAPS (POTASSIUM CHLORIDE) Take one tablet by mouth daily  #30 x 6   Entered by:   Judithe Modest CMA   Authorized by:   Marca Ancona, MD   Signed by:   Judithe Modest CMA on 08/21/2010   Method used:   Faxed to ...       [facility] - Pharmac (retail)       [address] [place], [place]  [address]       Ph: [phone]       Fax: [phone]   RxID:   [id number] CARVEDILOL 6.25 MG TABS (CARVEDILOL) 1  by mouth two times a day  #60 x 2   Entered by:   Judithe Modest CMA   Authorized by:   Marca Ancona, MD   Signed by:   Judithe Modest CMA on 08/21/2010   Method used:   Faxed to ...       [facility] - Pharmac (retail)       [address] [place], [place]  [address]       Ph: [phone]       Fax: [phone]   RxID:   [id number] CRESTOR 40 MG TABS (ROSUVASTATIN CALCIUM) one daily  #30 x 2   Entered by:   Judithe Modest CMA   Authorized by:   Marca Ancona, MD   Signed by:   Judithe Modest CMA on 08/21/2010   Method used:   Faxed to ...       [facility] - Pharmac (retail)       [address] [place], [place]  [address]       Ph: [phone]       Fax: [phone]   RxID:   [id number]

## 2010-09-11 ENCOUNTER — Encounter (INDEPENDENT_AMBULATORY_CARE_PROVIDER_SITE_OTHER): Payer: Self-pay | Admitting: *Deleted

## 2010-09-14 ENCOUNTER — Ambulatory Visit (INDEPENDENT_AMBULATORY_CARE_PROVIDER_SITE_OTHER): Payer: Self-pay | Admitting: Cardiology

## 2010-09-14 ENCOUNTER — Encounter: Payer: Self-pay | Admitting: Cardiology

## 2010-09-14 DIAGNOSIS — I5022 Chronic systolic (congestive) heart failure: Secondary | ICD-10-CM

## 2010-09-14 DIAGNOSIS — I2510 Atherosclerotic heart disease of native coronary artery without angina pectoris: Secondary | ICD-10-CM

## 2010-09-15 NOTE — Letter (Signed)
Summary: Appointment - Reschedule  [facility], Main Office  [address]   [place], [place] [address]   Phone: [phone]  Fax: [phone]     September 11, 2010 MRN: [id number]   Dorothy Li [address] [place], [place]  [address]   Dear [name],   Due to a change in our office schedule, your appointment on 09-30-2010                       at 2:45 p.m.    must be changed.  It is very important that we reach you to reschedule this appointment. We look forward to participating in your health care needs. Please contact us at the number listed above at your earliest convenience to reschedule this appointment.     Sincerely,         Lorne Skeens  [facility] Scheduling Team

## 2010-09-16 ENCOUNTER — Encounter: Payer: Self-pay | Admitting: Internal Medicine

## 2010-09-18 LAB — URINE CULTURE
Colony Count: NO GROWTH
Colony Count: NO GROWTH
Culture  Setup Time: 201108101710
Culture: NO GROWTH
Culture: NO GROWTH

## 2010-09-18 LAB — BASIC METABOLIC PANEL
BUN: 112 mg/dL — ABNORMAL HIGH (ref 6–23)
BUN: 13 mg/dL (ref 6–23)
BUN: 95 mg/dL — ABNORMAL HIGH (ref 6–23)
CO2: 15 mEq/L — ABNORMAL LOW (ref 19–32)
CO2: 17 mEq/L — ABNORMAL LOW (ref 19–32)
CO2: 19 mEq/L (ref 19–32)
CO2: 21 mEq/L (ref 19–32)
Calcium: 7.7 mg/dL — ABNORMAL LOW (ref 8.4–10.5)
Calcium: 8 mg/dL — ABNORMAL LOW (ref 8.4–10.5)
Calcium: 8.9 mg/dL (ref 8.4–10.5)
Chloride: 102 mEq/L (ref 96–112)
Chloride: 116 mEq/L — ABNORMAL HIGH (ref 96–112)
Creatinine, Ser: 0.96 mg/dL (ref 0.4–1.2)
Creatinine, Ser: 11.69 mg/dL — ABNORMAL HIGH (ref 0.4–1.2)
GFR calc Af Amer: 19 mL/min — ABNORMAL LOW (ref >60)
GFR calc Af Amer: >60 mL/min (ref >60)
GFR calc non Af Amer: 3 mL/min — ABNORMAL LOW (ref >60)
Glucose, Bld: 102 mg/dL — ABNORMAL HIGH (ref 70–99)
Glucose, Bld: 85 mg/dL (ref 70–99)
Potassium: 3.8 mEq/L (ref 3.5–5.1)
Potassium: 4.1 mEq/L (ref 3.5–5.1)
Sodium: 136 mEq/L (ref 135–145)
Sodium: 143 mEq/L (ref 135–145)

## 2010-09-18 LAB — PROTEIN ELECTROPH W RFLX QUANT IMMUNOGLOBULINS
Alpha-1-Globulin: 4.9 % (ref 2.9–4.9)
Alpha-2-Globulin: 12.7 % — ABNORMAL HIGH (ref 7.1–11.8)
Beta 2: 5.7 % (ref 3.2–6.5)
Gamma Globulin: 17.2 % (ref 11.1–18.8)

## 2010-09-18 LAB — URINALYSIS, MICROSCOPIC ONLY
Nitrite: NEGATIVE
Protein, ur: 100 mg/dL — AB
Specific Gravity, Urine: 1.019 (ref 1.005–1.030)
Urobilinogen, UA: 0.2 mg/dL (ref 0.0–1.0)

## 2010-09-18 LAB — TSH: TSH: 4.972 u[IU]/mL — ABNORMAL HIGH (ref 0.350–4.500)

## 2010-09-18 LAB — CBC
HCT: 30.5 % — ABNORMAL LOW (ref 36.0–46.0)
Hemoglobin: 13.3 g/dL (ref 12.0–15.0)
Hemoglobin: 9.8 g/dL — ABNORMAL LOW (ref 12.0–15.0)
MCH: 31.1 pg (ref 26.0–34.0)
MCH: 31.5 pg (ref 26.0–34.0)
MCV: 90 fL (ref 78.0–100.0)
MCV: 90.2 fL (ref 78.0–100.0)
Platelets: 179 10*3/uL (ref 150–400)
RBC: 3.11 MIL/uL — ABNORMAL LOW (ref 3.87–5.11)
RBC: 4.17 MIL/uL (ref 3.87–5.11)
RDW: 14.7 % (ref 11.5–15.5)
WBC: 7.7 10*3/uL (ref 4.0–10.5)

## 2010-09-18 LAB — RENAL FUNCTION PANEL
Calcium: 8.1 mg/dL — ABNORMAL LOW (ref 8.4–10.5)
Glucose, Bld: 109 mg/dL — ABNORMAL HIGH (ref 70–99)
Phosphorus: 2.2 mg/dL — ABNORMAL LOW (ref 2.3–4.6)
Sodium: 141 mEq/L (ref 135–145)

## 2010-09-18 LAB — MRSA PCR SCREENING: MRSA by PCR: NEGATIVE

## 2010-09-18 LAB — CORTISOL: Cortisol, Plasma: 11.8 ug/dL

## 2010-09-18 LAB — UIFE/LIGHT CHAINS/TP QN, 24-HR UR
Albumin, U: DETECTED
Alpha 1, Urine: DETECTED — AB
Beta, Urine: DETECTED — AB
Gamma Globulin, Urine: DETECTED — AB

## 2010-09-18 LAB — ACTH STIMULATION, 3 TIME POINTS
Cortisol, 30 Min: 20.2 ug/dL (ref >20)
Cortisol, 60 Min: 24.4 ug/dL (ref >20)

## 2010-09-18 LAB — VITAMIN B12: Vitamin B-12: 566 pg/mL (ref 211–911)

## 2010-09-18 LAB — COMPREHENSIVE METABOLIC PANEL
ALT: 16 U/L (ref 0–35)
AST: 18 U/L (ref 0–37)
Albumin: 4.4 g/dL (ref 3.5–5.2)
CO2: 12 mEq/L — ABNORMAL LOW (ref 19–32)
Calcium: 9.4 mg/dL (ref 8.4–10.5)
Chloride: 97 mEq/L (ref 96–112)
Creatinine, Ser: 12.36 mg/dL — ABNORMAL HIGH (ref 0.4–1.2)
GFR calc Af Amer: 4 mL/min — ABNORMAL LOW (ref >60)
GFR calc non Af Amer: 3 mL/min — ABNORMAL LOW (ref >60)
Sodium: 134 mEq/L — ABNORMAL LOW (ref 135–145)
Total Bilirubin: 0.4 mg/dL (ref 0.3–1.2)

## 2010-09-18 LAB — IRON: Iron: 69 ug/dL (ref 42–135)

## 2010-09-18 LAB — APTT: aPTT: 26 seconds (ref 24–37)

## 2010-09-18 LAB — CREATININE, URINE, RANDOM: Creatinine, Urine: 170 mg/dL

## 2010-09-18 LAB — CK TOTAL AND CKMB (NOT AT ARMC): CK, MB: 3.1 ng/mL (ref 0.3–4.0)

## 2010-09-20 LAB — CBC
HCT: 35.8 % — ABNORMAL LOW (ref 36.0–46.0)
Hemoglobin: 12 g/dL (ref 12.0–15.0)
MCH: 33.3 pg (ref 26.0–34.0)
MCHC: 33.6 g/dL (ref 30.0–36.0)
MCV: 99.2 fL (ref 78.0–100.0)

## 2010-09-20 LAB — COMPREHENSIVE METABOLIC PANEL
BUN: 5 mg/dL — ABNORMAL LOW (ref 6–23)
CO2: 25 mEq/L (ref 19–32)
Calcium: 9.3 mg/dL (ref 8.4–10.5)
Creatinine, Ser: 0.54 mg/dL (ref 0.4–1.2)
GFR calc non Af Amer: >60 mL/min (ref >60)
Glucose, Bld: 91 mg/dL (ref 70–99)

## 2010-09-20 LAB — MRSA PCR SCREENING: MRSA by PCR: NEGATIVE

## 2010-09-22 LAB — BASIC METABOLIC PANEL
BUN: 6 mg/dL (ref 6–23)
Chloride: 102 mEq/L (ref 96–112)
Chloride: 104 mEq/L (ref 96–112)
Creatinine, Ser: 0.55 mg/dL (ref 0.4–1.2)
Creatinine, Ser: 0.67 mg/dL (ref 0.4–1.2)
GFR calc Af Amer: >60 mL/min (ref >60)
GFR calc non Af Amer: >60 mL/min (ref >60)
Glucose, Bld: 128 mg/dL — ABNORMAL HIGH (ref 70–99)
Potassium: 3.8 mEq/L (ref 3.5–5.1)

## 2010-09-22 LAB — GLUCOSE, CAPILLARY
Glucose-Capillary: 101 mg/dL — ABNORMAL HIGH (ref 70–99)
Glucose-Capillary: 103 mg/dL — ABNORMAL HIGH (ref 70–99)
Glucose-Capillary: 107 mg/dL — ABNORMAL HIGH (ref 70–99)
Glucose-Capillary: 124 mg/dL — ABNORMAL HIGH (ref 70–99)
Glucose-Capillary: 130 mg/dL — ABNORMAL HIGH (ref 70–99)
Glucose-Capillary: 172 mg/dL — ABNORMAL HIGH (ref 70–99)
Glucose-Capillary: 96 mg/dL (ref 70–99)

## 2010-09-22 LAB — CBC
HCT: 32.1 % — ABNORMAL LOW (ref 36.0–46.0)
Hemoglobin: 11.3 g/dL — ABNORMAL LOW (ref 12.0–15.0)
MCV: 108.5 fL — ABNORMAL HIGH (ref 78.0–100.0)
Platelets: 182 10*3/uL (ref 150–400)
WBC: 13.7 10*3/uL — ABNORMAL HIGH (ref 4.0–10.5)

## 2010-09-22 NOTE — Assessment & Plan Note (Signed)
Summary: F3M/PER CHECK OUT/SF/AMD   Visit Type:  3 months follow up Primary Provider:  Dr. Clelia Croft, [facility]  CC:  No complaints.  History of Present Illness: 65 yo with history of cardiomyopathy s/p ICD, and CAD s/p CABG presents for followup.   Patient had a left heart cath in 4/11 which showed a 75% ostial left main stenosis.  She had CABG x 2.  She has moderate to severe LV systolic dysfunction that I think is a mixed picture, due to both CAD and probably ETOH abuse.  Since her CABG, she has quit smoking and quit drinking ETOH. She had syncope and episodes of VT on monitor, so Medtronic ICD was placed in 7/11.    Since last appointment, she has been doing well symptomatically.  She is able to walk to [employer] without dyspnea and can walk up a flight of steps without dyspnea.  No orthopnea or PND.  No chest pain.  Her weight is up 12 lbs since last appointment but she has no peripheral edema and does not feel like she is retaining fluid. No ICD discharges.  Optivol was checked today: fluid index is below threshold and thoracic impedance trend is basically flat.  Labs (8/11): BNP 181 Labs (9/11): K 4.4, creatinine 0.6 Labs (10/11): BNP 95 Labs (11/11): BNP 65, creatinine 0.8, K 4.7, LDL 86, HDL 55 Labs (12/11): K 4.5, creatinine 0.7 Labs (1/12): LDL 69, HDL 41  ECG: NSR, nonspecific diffuse T wave changes  Current Medications (verified): 1)  Aspirin Ec 325 Mg Tbec (Aspirin) .... Take One Tablet By Mouth Daily 2)  Crestor 40 Mg Tabs (Rosuvastatin Calcium) .... One Daily 3)  Carvedilol 6.25 Mg Tabs (Carvedilol) .[name].. 1  By Mouth Two Times A Day 4)  Nitrostat 0.4 Mg Subl (Nitroglycerin) .[name].. 1 Tablet Under Tongue At Onset of Chest Pain; You May Repeat Every 5 Minutes For Up To 3 Doses. 5)  Potassium Chloride Cr 10 Meq Cr-Caps (Potassium Chloride) .... Take One Tablet By Mouth Daily 6)  Enalapril Maleate 5 Mg Tabs (Enalapril Maleate) .... One Twice A Day 7)  Furosemide 20 Mg Tabs  (Furosemide) .... Take 1 Tablet Everyday 8)  Aldactone 25 Mg Tabs (Spironolactone) .... Take 1/2 Tablet Daily 9)  Calcium 600+d Plus Minerals 600-400 Mg-Unit Tabs (Calcium Carbonate-Vit D-Min) .... Take 1 Tablet By Mouth Two Times A Day 10)  Gas Relief 80 Mg Chew (Simethicone) .... As Needed  Allergies: 1)  ! Pcn  Past History:  Past Medical History: 1. HTN 2. ETOH abuse: Elevated AST, mildly elevated INR (? component of cirrhosis). Has now quit ETOH.  3. Prior 4. Hyperlipidemia 5. Nephrolithiasis 6. Cardiomyopathy: Echo (4/11) with EF 25%, diffuse hypokinesis, moderate LV dilation, decreased RV systolic function but normal RV size, PA systolic pressure 35 mmHg. Lexiscan myoview (4/11): EF 29%, diffuse hypokinesis, basal anteroseptal and apical scar, no ischemia.  The cardiomyopathy is likely due to a combination of CAD (LM stenosis) and ETOH abuse.  ANA and SPEP were negative.  Echo (6/11): EF 25%, mild diastolic dysfunction, normal RV, no pulmonary HTN 7. VT: Dual chamber Medtronic ICD placed 7/11 8. CAD: LHC (4/11) showed 75% ostial LM stenosis with significant damping upon catheter engagement, 60-70% mLAD, 60% ostial D1, EF 30%.  Patient had CABG with LIMA-LAD and SVG-OM2.  9. carotid stenosis: carotid dopplers (4/11) with 60-79% LICA stenosis.  Carotid dopplers (10/11): 60-79% LICA stenosis.   10.  Syncope 11.  ARF in setting of dehydration with ACEI use.  Family History: Reviewed history from 10/08/2009 and no changes required. Sister with heart surgery, she is not sure why.   Social History: Reviewed history from 11/11/2009 and no changes required. 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 Married, lives in [place] Quit smoking 4/11 Prior moderate ETOH use (2 liquor drinks daily) but quit in 3/11.  No drug use and negative urine drug screen in 2/11  Review of Systems       All systems reviewed and negative except as per HPI.   Vital Signs:  Patient profile:   65  year old female Height:      71 inches Weight:      159 pounds BMI:     22.26 Pulse rate:   74 / minute Pulse rhythm:   regular Resp:     18 per minute BP sitting:   108 / 62  (left arm)  Vitals Entered By: Vikki Ports (September 14, 2010 9:02 AM)  Physical Exam  General:  Well developed, well nourished, in no acute distress. Neck:  Neck supple, no JVD. No masses, thyromegaly or abnormal cervical nodes. Lungs:  Clear bilaterally to auscultation and percussion. Heart:  Non-displaced PMI, chest non-tender; regular rate and rhythm, S1, S2 without murmurs, rubs. +S4. Carotid upstroke normal, no bruit.  Pedals normal pulses. No edema, no varicosities. Abdomen:  Bowel sounds positive; abdomen soft and non-tender without masses, organomegaly, or hernias noted. No hepatosplenomegaly. Extremities:  No clubbing or cyanosis. Neurologic:  Alert and oriented x 3. Psych:  Normal affect.    ICD Specifications Following MD:  Sherryl Manges, MD     ICD Vendor:  Medtronic     ICD Model Number:  [id number]     ICD Serial Number:  [id number] ICD DOI:  01/02/2010     ICD Implanting MD:  Sherryl Manges, MD  Lead 1:    Location: RA     DOI: 01/02/2010     Model #: [id number]     Serial #: [id number]     Status: active Lead 2:    Location: RV     DOI: 01/02/2010     Model #: [id number]     Serial #: [id number]     Status: active  Indications::  VT   ICD Follow Up ICD Dependent:  No      Episodes Coumadin:  No  Brady Parameters Mode MVP     Lower Rate Limit:  60     Upper Rate Limit 130 PAV 180     Sensed AV Delay:  150  Tachy Zones VF:  222     VT:  188-222     VT1:  OFF     Atrial Zones:  171 (MONITOR)  Impression & Recommendations:  Problem # 1:  CORONARY ATHEROSLERO UNSPEC TYPE BYPASS GRAFT (ICD-414.05) Stable with no chest pain.  Continue current meds except can decrease ASA to 81 mg daily.  LDL is at goal (< 70).   Problem # 2:  CHRONIC SYSTOLIC HEART FAILURE (ICD-428.22) Stable symptomatically (NYHA  class II symptoms).  EF 25% in 6/11.  Has ICD.  Optivol fluid index below threshold and impedance line basically flat.  No volume overloaded on exam.  Will increase spironolactone to 25 mg daily and increase Coreg to 9.375 mg two times a day.  Can stop KCl supplement.  BMET in 2 weeks and followup in the office in 4 months.   Problem # 3:  CAROTID BRUIT (ICD-785.9) Moderate LICA stenosis.  Followup dopplers in  4/12.     Problem # 4:  VENTRICULAR TACHYCARDIA (ICD-427.1) No recurrent VT.  No ICD discharges.   Patient Instructions: 1)  Your physician has recommended you make the following change in your medication:  2)  Stop potassium(KCL). 3)  Decrease Aspirin to 81mg  daily--this should be enteric coated. 4)  Inrease Spironolactone(Aldactone) to 25mg  daily. 5)  Increase Coreg(carvedilol) to 9.375mg  twice a day. This will be one and one-half 6.25mg  Coreg(carvedilol) twice a day. 6)  Your physician recommends that you return for lab work in: 2 weeks--BMP 414.05  428.2 7)  Carotid doppler scheduled for April 12,2012 at [facility]. 8)  Your physician wants you to follow-up in: 4 months with Dr Harmon Dun 2012) You will receive a reminder letter in the mail two months in advance. If you don't receive a letter, please call our office to schedule the follow-up appointment. Prescriptions: ALDACTONE 25 MG TABS (SPIRONOLACTONE) take one daily  #30 x 6   Entered by:   Katina Dung, RN, BSN   Authorized by:   Marca Ancona, MD   Signed by:   Katina Dung, RN, BSN on 09/14/2010   Method used:   Faxed to ...       [facility] (retail)       [address] [place], [place]  [address]       Ph: [phone]       Fax: [phone]   RxID:   [id number] COREG 6.25 MG TABS (CARVEDILOL) one and one-half twice a day  #90 x 11   Entered by:   Katina Dung, RN, BSN   Authorized by:   Marca Ancona, MD   Signed by:   Katina Dung, RN, BSN on 09/14/2010   Method  used:   Faxed to ...       [facility] - [facility] (retail)       [address] [place], [place]  [address]       Ph: [phone]       Fax: [phone]   RxID:   [id number]

## 2010-09-23 LAB — BASIC METABOLIC PANEL
BUN: 2 mg/dL — ABNORMAL LOW (ref 6–23)
CO2: 22 mEq/L (ref 19–32)
Calcium: 8.7 mg/dL (ref 8.4–10.5)
Chloride: 108 mEq/L (ref 96–112)
Creatinine, Ser: 0.46 mg/dL (ref 0.4–1.2)
GFR calc Af Amer: >60 mL/min (ref >60)
GFR calc non Af Amer: >60 mL/min (ref >60)
Glucose, Bld: 100 mg/dL — ABNORMAL HIGH (ref 70–99)
Potassium: 3.9 mEq/L (ref 3.5–5.1)
Sodium: 143 mEq/L (ref 135–145)

## 2010-09-23 LAB — POCT I-STAT 3, ART BLOOD GAS (G3+)
Bicarbonate: 20.3 mEq/L (ref 20.0–24.0)
Bicarbonate: 21.4 mEq/L (ref 20.0–24.0)
Bicarbonate: 21.5 mEq/L (ref 20.0–24.0)
O2 Saturation: 100 %
O2 Saturation: 93 %
O2 Saturation: 95 %
TCO2: 21 mmol/L (ref 0–100)
TCO2: 22 mmol/L (ref 0–100)
TCO2: 22 mmol/L (ref 0–100)
pCO2 arterial: 30.6 mmHg — ABNORMAL LOW (ref 35.0–45.0)
pCO2 arterial: 31.6 mmHg — ABNORMAL LOW (ref 35.0–45.0)
pCO2 arterial: 31.9 mmHg — ABNORMAL LOW (ref 35.0–45.0)
pH, Arterial: 7.311 — ABNORMAL LOW (ref 7.350–7.400)
pH, Arterial: 7.454 — ABNORMAL HIGH (ref 7.350–7.400)
pO2, Arterial: 328 mmHg — ABNORMAL HIGH (ref 80.0–100.0)
pO2, Arterial: 64 mmHg — ABNORMAL LOW (ref 80.0–100.0)
pO2, Arterial: 65 mmHg — ABNORMAL LOW (ref 80.0–100.0)
pO2, Arterial: 88 mmHg (ref 80.0–100.0)

## 2010-09-23 LAB — PROTIME-INR
INR: 1.33 (ref 0.00–1.49)
INR: 2.23 — ABNORMAL HIGH (ref 0.00–1.49)
Prothrombin Time: 16.4 seconds — ABNORMAL HIGH (ref 11.6–15.2)

## 2010-09-23 LAB — POCT I-STAT, CHEM 8
Chloride: 108 mEq/L (ref 96–112)
HCT: 33 % — ABNORMAL LOW (ref 36.0–46.0)
Potassium: 4 mEq/L (ref 3.5–5.1)
Sodium: 145 mEq/L (ref 135–145)

## 2010-09-23 LAB — POCT I-STAT 4, (NA,K, GLUC, HGB,HCT)
Glucose, Bld: 102 mg/dL — ABNORMAL HIGH (ref 70–99)
Glucose, Bld: 123 mg/dL — ABNORMAL HIGH (ref 70–99)
Glucose, Bld: 89 mg/dL (ref 70–99)
Glucose, Bld: 90 mg/dL (ref 70–99)
HCT: 26 % — ABNORMAL LOW (ref 36.0–46.0)
HCT: 26 % — ABNORMAL LOW (ref 36.0–46.0)
HCT: 36 % (ref 36.0–46.0)
HCT: 37 % (ref 36.0–46.0)
HCT: 37 % (ref 36.0–46.0)
Hemoglobin: 11.9 g/dL — ABNORMAL LOW (ref 12.0–15.0)
Hemoglobin: 12.2 g/dL (ref 12.0–15.0)
Hemoglobin: 8.8 g/dL — ABNORMAL LOW (ref 12.0–15.0)
Potassium: 3.3 mEq/L — ABNORMAL LOW (ref 3.5–5.1)
Potassium: 3.5 mEq/L (ref 3.5–5.1)
Potassium: 3.6 mEq/L (ref 3.5–5.1)
Potassium: 4.9 mEq/L (ref 3.5–5.1)
Sodium: 137 mEq/L (ref 135–145)
Sodium: 139 mEq/L (ref 135–145)
Sodium: 141 mEq/L (ref 135–145)

## 2010-09-23 LAB — CBC
HCT: 32.1 % — ABNORMAL LOW (ref 36.0–46.0)
HCT: 33.3 % — ABNORMAL LOW (ref 36.0–46.0)
HCT: 36.1 % (ref 36.0–46.0)
HCT: 42 % (ref 36.0–46.0)
Hemoglobin: 11.6 g/dL — ABNORMAL LOW (ref 12.0–15.0)
Hemoglobin: 12.8 g/dL (ref 12.0–15.0)
Hemoglobin: 13.6 g/dL (ref 12.0–15.0)
Hemoglobin: 14.5 g/dL (ref 12.0–15.0)
MCHC: 34.1 g/dL (ref 30.0–36.0)
MCHC: 35.4 g/dL (ref 30.0–36.0)
MCV: 107.5 fL — ABNORMAL HIGH (ref 78.0–100.0)
MCV: 108.2 fL — ABNORMAL HIGH (ref 78.0–100.0)
Platelets: 165 10*3/uL (ref 150–400)
Platelets: 264 10*3/uL (ref 150–400)
RBC: 3.37 MIL/uL — ABNORMAL LOW (ref 3.87–5.11)
RBC: 3.7 MIL/uL — ABNORMAL LOW (ref 3.87–5.11)
RDW: 12.2 % (ref 11.5–15.5)
RDW: 12.7 % (ref 11.5–15.5)
WBC: 13.2 10*3/uL — ABNORMAL HIGH (ref 4.0–10.5)
WBC: 16.7 10*3/uL — ABNORMAL HIGH (ref 4.0–10.5)
WBC: 8.1 10*3/uL (ref 4.0–10.5)

## 2010-09-23 LAB — POCT I-STAT 3, VENOUS BLOOD GAS (G3P V)
Acid-base deficit: 3 mmol/L — ABNORMAL HIGH (ref 0.0–2.0)
Bicarbonate: 21.5 mEq/L (ref 20.0–24.0)
O2 Saturation: 72 %
TCO2: 23 mmol/L (ref 0–100)
pH, Ven: 7.377 — ABNORMAL HIGH (ref 7.250–7.300)

## 2010-09-23 LAB — LIPID PANEL
Cholesterol: 186 mg/dL (ref 0–200)
LDL Cholesterol: 128 mg/dL — ABNORMAL HIGH (ref 0–99)
Total CHOL/HDL Ratio: 4.7 RATIO
VLDL: 18 mg/dL (ref 0–40)

## 2010-09-23 LAB — URINALYSIS, MICROSCOPIC ONLY
Nitrite: NEGATIVE
Protein, ur: NEGATIVE mg/dL
Specific Gravity, Urine: 1.01 (ref 1.005–1.030)
Urobilinogen, UA: 0.2 mg/dL (ref 0.0–1.0)

## 2010-09-23 LAB — COMPREHENSIVE METABOLIC PANEL
Albumin: 3.2 g/dL — ABNORMAL LOW (ref 3.5–5.2)
BUN: 5 mg/dL — ABNORMAL LOW (ref 6–23)
Chloride: 109 mEq/L (ref 96–112)
Creatinine, Ser: 0.45 mg/dL (ref 0.4–1.2)
GFR calc non Af Amer: >60 mL/min (ref >60)
Glucose, Bld: 98 mg/dL (ref 70–99)
Total Bilirubin: 0.3 mg/dL (ref 0.3–1.2)

## 2010-09-23 LAB — GLUCOSE, CAPILLARY
Glucose-Capillary: 101 mg/dL — ABNORMAL HIGH (ref 70–99)
Glucose-Capillary: 105 mg/dL — ABNORMAL HIGH (ref 70–99)
Glucose-Capillary: 125 mg/dL — ABNORMAL HIGH (ref 70–99)
Glucose-Capillary: 151 mg/dL — ABNORMAL HIGH (ref 70–99)
Glucose-Capillary: 237 mg/dL — ABNORMAL HIGH (ref 70–99)

## 2010-09-23 LAB — MAGNESIUM: Magnesium: 2.6 mg/dL — ABNORMAL HIGH (ref 1.5–2.5)

## 2010-09-23 LAB — TYPE AND SCREEN: Antibody Screen: NEGATIVE

## 2010-09-23 LAB — CREATININE, SERUM: GFR calc non Af Amer: >60 mL/min (ref >60)

## 2010-09-23 LAB — APTT: aPTT: 27 seconds (ref 24–37)

## 2010-09-23 LAB — ABO/RH: ABO/RH(D): B POS

## 2010-09-28 ENCOUNTER — Other Ambulatory Visit: Payer: Self-pay

## 2010-09-30 ENCOUNTER — Other Ambulatory Visit (HOSPITAL_COMMUNITY): Payer: Self-pay | Admitting: Family Medicine

## 2010-09-30 ENCOUNTER — Other Ambulatory Visit (INDEPENDENT_AMBULATORY_CARE_PROVIDER_SITE_OTHER): Payer: Self-pay | Admitting: *Deleted

## 2010-09-30 ENCOUNTER — Encounter: Payer: Self-pay | Admitting: Internal Medicine

## 2010-09-30 ENCOUNTER — Encounter (INDEPENDENT_AMBULATORY_CARE_PROVIDER_SITE_OTHER): Payer: Self-pay | Admitting: *Deleted

## 2010-09-30 DIAGNOSIS — Z1231 Encounter for screening mammogram for malignant neoplasm of breast: Secondary | ICD-10-CM

## 2010-09-30 DIAGNOSIS — I5020 Unspecified systolic (congestive) heart failure: Secondary | ICD-10-CM

## 2010-09-30 DIAGNOSIS — I25810 Atherosclerosis of coronary artery bypass graft(s) without angina pectoris: Secondary | ICD-10-CM

## 2010-09-30 LAB — CONVERTED CEMR LAB: Sed Rate: 13 mm/hr (ref 0–22)

## 2010-09-30 LAB — BASIC METABOLIC PANEL
BUN: 21 mg/dL (ref 6–23)
CO2: 26 mEq/L (ref 19–32)
Chloride: 106 mEq/L (ref 96–112)
Creatinine, Ser: 0.6 mg/dL (ref 0.4–1.2)

## 2010-10-02 ENCOUNTER — Ambulatory Visit (HOSPITAL_COMMUNITY): Payer: Self-pay

## 2010-10-14 ENCOUNTER — Other Ambulatory Visit: Payer: Self-pay | Admitting: *Deleted

## 2010-10-14 DIAGNOSIS — I6529 Occlusion and stenosis of unspecified carotid artery: Secondary | ICD-10-CM

## 2010-10-15 ENCOUNTER — Other Ambulatory Visit: Payer: Self-pay | Admitting: *Deleted

## 2010-10-15 ENCOUNTER — Encounter (INDEPENDENT_AMBULATORY_CARE_PROVIDER_SITE_OTHER): Payer: Self-pay | Admitting: *Deleted

## 2010-10-15 DIAGNOSIS — I6529 Occlusion and stenosis of unspecified carotid artery: Secondary | ICD-10-CM

## 2010-10-16 ENCOUNTER — Ambulatory Visit (HOSPITAL_COMMUNITY)
Admission: RE | Admit: 2010-10-16 | Discharge: 2010-10-16 | Disposition: A | Discharge status: Home / Self Care | Payer: Self-pay | Source: Ambulatory Visit | Attending: Family Medicine | Admitting: Family Medicine

## 2010-10-16 DIAGNOSIS — Z1231 Encounter for screening mammogram for malignant neoplasm of breast: Secondary | ICD-10-CM | POA: Insufficient documentation

## 2010-10-19 ENCOUNTER — Encounter: Payer: Self-pay | Admitting: Cardiology

## 2010-11-03 ENCOUNTER — Ambulatory Visit (INDEPENDENT_AMBULATORY_CARE_PROVIDER_SITE_OTHER): Payer: Self-pay | Admitting: Internal Medicine

## 2010-11-03 ENCOUNTER — Encounter: Payer: Self-pay | Admitting: Internal Medicine

## 2010-11-03 DIAGNOSIS — Z95810 Presence of automatic (implantable) cardiac defibrillator: Secondary | ICD-10-CM

## 2010-11-03 DIAGNOSIS — I5022 Chronic systolic (congestive) heart failure: Secondary | ICD-10-CM

## 2010-11-03 DIAGNOSIS — I428 Other cardiomyopathies: Secondary | ICD-10-CM

## 2010-11-03 DIAGNOSIS — I25810 Atherosclerosis of coronary artery bypass graft(s) without angina pectoris: Secondary | ICD-10-CM

## 2010-11-03 DIAGNOSIS — I472 Ventricular tachycardia: Secondary | ICD-10-CM

## 2010-11-03 NOTE — Progress Notes (Signed)
  HPI  Dorothy Li is a 65 y.o. female seen in followup for an ICD implanted for ventricular tachycardia with syncope. She has a history  of coronary disease with prior bypass surgery and left ventricular dysfunction felt to be somewhat out of proportion to her coronary disease    The patient denies SOB, chest pain, edema or palpitations         Labs (8/11): creatinine 12.3=>0.96=>0.7, K 3.8, BNP 425 Past Medical History  Diagnosis Date  . HTN (hypertension)   . ETOH abuse      Elevated AST, mildly elevated INR (? component of cirrhosis). Has now quit ETOH.   [name] Hyperlipemia   . Nephrolithiasis   . Cardiomyopathy     Echo (4/11) with EF 25%, diffuse hypokinesis, moderate LV dilation, decreased RV  systolic function but normal RV size, PA systolic pressure 35 mmHg. Lexiscan myoview (4/11): EF 29%,  diffuse hypokinesis, basal anteroseptal and apical scar, no ischemia.  The cardiomyopathy is likely due to a combination of CAD (LM stenosis) and ETOH abuse.   . VT (ventricular tachycardia)     Dual chamber Medtronic ICD placed 7/11  . CAD (coronary artery disease)     LHC (4/11) showed 75% ostial LM stenosis with significant damping upon catheter engagement, 60-70% mLAD, 60% ostial D1, EF 30%.  Patient had CABG with LIMA-LAD and SVG-OM2.   . Carotid stenosis     carotid dopplers (4/11) with 60-79% LICA stenosis.  Carotid dopplers (10/11): 60-79%  LICA stenosis.  . Syncope     Past Surgical History  Procedure Date  . Icd implantation     ICD-Medtronic    Remote - No    Current Outpatient Prescriptions  Medication Sig Dispense Refill  . aspirin 325 MG tablet Take 325 mg by mouth daily.        . Calcium Carbonate-Vitamin D (CALCIUM 600-D) 600-400 MG-UNIT per tablet Take 1 tablet by mouth 2 (two) times daily.        . carvedilol (COREG) 6.25 MG tablet Take by mouth. 1 1/2 HALF TABLETS DAILY.       [name] enalapril (VASOTEC) 5 MG tablet Take 5 mg by mouth 2 (two) times daily.        .  furosemide (LASIX) 20 MG tablet Take 20 mg by mouth daily.       . nitroGLYCERIN (NITROSTAT) 0.4 MG SL tablet Place 0.4 mg under the tongue every 5 (five) minutes as needed.        . rosuvastatin (CRESTOR) 40 MG tablet Take 40 mg by mouth daily.        [name] spironolactone (ALDACTONE) 25 MG tablet Take 25 mg by mouth daily.          Allergies  Allergen Reactions  . Penicillins     Review of Systems negative except from HPI and PMH  Physical Exam Well developed and well nourished in no acute distress HENT normal E scleral and icterus clear Neck Supple JVP flat; carotids brisk and full Clear to ausculation Regular rate and rhythm, no murmurs gallops or rub Soft with active bowel sounds No clubbing cyanosis and edema Alert and oriented, grossly normal motor and sensory function Skin Warm and Dry     Assessment and  Plan

## 2010-11-03 NOTE — Patient Instructions (Signed)
Your physician recommends that you schedule a follow-up appointment in: 3 months in the device clinic  Your physician recommends that you continue on your current medications as directed. Please refer to the Current Medication list given to you today.   

## 2010-11-03 NOTE — Assessment & Plan Note (Signed)
stable °

## 2010-11-03 NOTE — Assessment & Plan Note (Signed)
The patient's device was interrogated.  The information was reviewed. No changes were made in the programming.    

## 2010-11-03 NOTE — Assessment & Plan Note (Signed)
No recurrent vt 

## 2010-11-03 NOTE — Assessment & Plan Note (Signed)
Stable on current meds 

## 2010-11-09 ENCOUNTER — Other Ambulatory Visit (HOSPITAL_COMMUNITY): Payer: Self-pay | Admitting: Family Medicine

## 2010-11-09 DIAGNOSIS — E079 Disorder of thyroid, unspecified: Secondary | ICD-10-CM

## 2010-11-10 ENCOUNTER — Ambulatory Visit (HOSPITAL_COMMUNITY)
Admission: RE | Admit: 2010-11-10 | Discharge: 2010-11-10 | Disposition: A | Discharge status: Home / Self Care | Payer: Self-pay | Source: Ambulatory Visit | Attending: Family Medicine | Admitting: Family Medicine

## 2010-11-10 DIAGNOSIS — R946 Abnormal results of thyroid function studies: Secondary | ICD-10-CM | POA: Insufficient documentation

## 2010-11-10 DIAGNOSIS — E079 Disorder of thyroid, unspecified: Secondary | ICD-10-CM

## 2010-11-10 DIAGNOSIS — E041 Nontoxic single thyroid nodule: Secondary | ICD-10-CM | POA: Insufficient documentation

## 2010-11-10 DIAGNOSIS — R599 Enlarged lymph nodes, unspecified: Secondary | ICD-10-CM | POA: Insufficient documentation

## 2010-11-17 NOTE — Assessment & Plan Note (Signed)
OFFICE VISIT   RHYLEE, PUCILLO  DOB:  Apr 19, 1946                                        Nov 06, 2009  CHART #:  [id number]   REASON FOR VISIT:  Followup after recent coronary artery bypass  grafting.   HISTORY OF PRESENT ILLNESS:  The patient is a 65 year old woman who  presented with presyncopal spells.  Workup revealed dilated  cardiomyopathy.  At cardiac catheterization, she had a 70% ostial left  main stenosis.  She underwent coronary bypass grafting x3 on October 15, 2009.  Postoperatively, she had a lot of sinus tachycardia and required  continuing escalating her doses of Coreg.  She had been on 6.25 mg  b.i.d. prior to admission.  She was on 25 mg b.i.d. at the time of  discharge.   CURRENT MEDICATIONS:  1. Oxycodone 5 mg tablets p.r.n.  2. Crestor 20 mg nightly.  3. Aspirin 325 mg daily.  4. Coreg 25 mg b.i.d.  5. Chantix 0.5 mg b.i.d. p.r.n.  6. Enalapril 5 mg b.i.d.  7. Vitamin D 50,000 units weekly.   ALLERGIES:  She has an allergy to penicillin.   PHYSICAL EXAMINATION:  The patient is a 65 year old woman, in no acute  distress.  Her blood pressure is 104/82, pulse 94, respirations are 18,  and her oxygen saturation is 99% on room air.  Her lungs are clear with  equal breath sounds bilaterally.  Sternum is stable.  Sternal incision  well healed.  Leg wounds are healing well.  There is no peripheral  edema.   DIAGNOSTIC TESTS:  Chest x-ray shows good aeration of the lungs  bilaterally.   IMPRESSION:  The patient is a 65 year old woman who presented with  presyncopal spells.  A workup revealed dilated cardiomyopathy and left  main disease.  She underwent coronary bypass grafting.  During the  postoperative period, she had persistent sinus tachycardia and required  escalating doses of Coreg.  This past Monday, which was 3 days ago, she  got up out of bed and ran to the door quickly with someone was knocking  and she passed out.  She says  she has had one other dizzy spell since  discharge.  This correlates pretty well with her preoperative symptoms.  I am not sure that was necessarily related to her coronary disease.  However, on further questioning with regarding her medication, she says  she is taking a tablet Coreg once daily and then carvedilol twice daily,  which leaves me to be concerned that she is taking her preoperative dose  of Coreg in addition to the prescription we gave her at discharge and is  confused the generic with the brand name.  She did not bring her  medications with her.  I asked her to call in to our office when she  gets home and we need to clarify that issue and make sure that she is  not doubling up on that medication.  In any event, I think she does need  her Coreg dose decreased and go back to her baseline doses 6.25 b.i.d.  Otherwise, she is doing well.  I did ask her not to drive until this  dizziness issue is resolved.  If she continues to have that after her  beta-blocker doses adjusted, then she probably needs to be seen by  a  neurologist.  She will continue to follow up with Dr. Shirlee Latch.  I would  be happy to see her back at anytime if I can be of any further  assistance with her care.   Salvatore Decent Dorris Fetch, M.D.  Electronically Signed   SCH/MEDQ  D:  11/06/2009  T:  11/06/2009  Job:  [id number]   cc:   Marca Ancona, MD  Syliva Overman, MD

## 2010-11-25 ENCOUNTER — Telehealth: Payer: Self-pay | Admitting: Cardiology

## 2010-11-25 MED ORDER — ROSUVASTATIN CALCIUM 40 MG PO TABS: 40.0000 mg | ORAL_TABLET | Freq: Every day | ORAL | Status: DC

## 2010-11-25 MED ORDER — ENALAPRIL MALEATE 5 MG PO TABS: 5.0000 mg | ORAL_TABLET | Freq: Two times a day (BID) | ORAL | Status: DC

## 2010-11-25 MED ORDER — FUROSEMIDE 20 MG PO TABS: 20.0000 mg | ORAL_TABLET | Freq: Every day | ORAL | Status: DC

## 2010-11-25 NOTE — Telephone Encounter (Signed)
Pt has two rx that no longer have refills, crestor and furosimide [facility] then enalapril [facility] [address]

## 2010-11-25 NOTE — Telephone Encounter (Signed)
LMOM for pt that rx sent into pharmacy. [place]

## 2010-12-15 ENCOUNTER — Telehealth: Payer: Self-pay | Admitting: Cardiology

## 2010-12-15 NOTE — Telephone Encounter (Signed)
Pt called in prescription for refills on May 23.  1 was called to [facility] but 2 should have been sent to [facility] and they did not receive them.  Furosemide, and Crestor. [facility] is on [address] Pt will be out by Friday!!!

## 2010-12-16 NOTE — Telephone Encounter (Signed)
Pt called rx in 5/23. crestor 40 mg. Furosemide 20 mg. [facility] on [address] Pt will be out Friday.

## 2010-12-17 ENCOUNTER — Other Ambulatory Visit: Payer: Self-pay | Admitting: Cardiology

## 2010-12-17 MED ORDER — ENALAPRIL MALEATE 5 MG PO TABS: 5.0000 mg | ORAL_TABLET | Freq: Two times a day (BID) | ORAL | Status: DC

## 2010-12-17 NOTE — Telephone Encounter (Signed)
Crestor and furosemide called into pharmacy at [facility]. Pt walk in and notified. RX for enalapril sent into [facility].

## 2010-12-17 NOTE — Telephone Encounter (Signed)
Called in to pharmacy  [facility] [phone] crestor and furosemide 1 year supply

## 2011-01-22 ENCOUNTER — Encounter: Payer: Self-pay | Admitting: Cardiology

## 2011-01-26 ENCOUNTER — Ambulatory Visit (INDEPENDENT_AMBULATORY_CARE_PROVIDER_SITE_OTHER): Payer: Self-pay | Admitting: *Deleted

## 2011-01-26 ENCOUNTER — Inpatient Hospital Stay (HOSPITAL_COMMUNITY)
Admission: EM | Admit: 2011-01-26 | Discharge: 2011-01-27 | DRG: 315 | Disposition: A | Discharge status: Home / Self Care | Payer: Self-pay | Attending: Internal Medicine | Admitting: Internal Medicine

## 2011-01-26 ENCOUNTER — Encounter: Payer: Self-pay | Admitting: Internal Medicine

## 2011-01-26 ENCOUNTER — Ambulatory Visit (INDEPENDENT_AMBULATORY_CARE_PROVIDER_SITE_OTHER): Payer: Self-pay | Admitting: Cardiology

## 2011-01-26 ENCOUNTER — Encounter: Payer: Self-pay | Admitting: Cardiology

## 2011-01-26 ENCOUNTER — Emergency Department (HOSPITAL_COMMUNITY): Payer: Self-pay

## 2011-01-26 DIAGNOSIS — I959 Hypotension, unspecified: Secondary | ICD-10-CM | POA: Insufficient documentation

## 2011-01-26 DIAGNOSIS — I509 Heart failure, unspecified: Secondary | ICD-10-CM | POA: Diagnosis present

## 2011-01-26 DIAGNOSIS — R55 Syncope and collapse: Secondary | ICD-10-CM | POA: Diagnosis present

## 2011-01-26 DIAGNOSIS — Z95810 Presence of automatic (implantable) cardiac defibrillator: Secondary | ICD-10-CM

## 2011-01-26 DIAGNOSIS — E872 Acidosis, unspecified: Secondary | ICD-10-CM | POA: Diagnosis present

## 2011-01-26 DIAGNOSIS — I5042 Chronic combined systolic (congestive) and diastolic (congestive) heart failure: Secondary | ICD-10-CM | POA: Diagnosis present

## 2011-01-26 DIAGNOSIS — I428 Other cardiomyopathies: Secondary | ICD-10-CM | POA: Diagnosis present

## 2011-01-26 DIAGNOSIS — E7800 Pure hypercholesterolemia, unspecified: Secondary | ICD-10-CM

## 2011-01-26 DIAGNOSIS — I6529 Occlusion and stenosis of unspecified carotid artery: Secondary | ICD-10-CM

## 2011-01-26 DIAGNOSIS — I2589 Other forms of chronic ischemic heart disease: Secondary | ICD-10-CM | POA: Diagnosis present

## 2011-01-26 DIAGNOSIS — I5022 Chronic systolic (congestive) heart failure: Secondary | ICD-10-CM

## 2011-01-26 DIAGNOSIS — N19 Unspecified kidney failure: Secondary | ICD-10-CM

## 2011-01-26 DIAGNOSIS — Z7982 Long term (current) use of aspirin: Secondary | ICD-10-CM

## 2011-01-26 DIAGNOSIS — M26609 Unspecified temporomandibular joint disorder, unspecified side: Secondary | ICD-10-CM | POA: Diagnosis present

## 2011-01-26 DIAGNOSIS — F1011 Alcohol abuse, in remission: Secondary | ICD-10-CM | POA: Diagnosis present

## 2011-01-26 DIAGNOSIS — I2510 Atherosclerotic heart disease of native coronary artery without angina pectoris: Secondary | ICD-10-CM | POA: Diagnosis present

## 2011-01-26 DIAGNOSIS — I10 Essential (primary) hypertension: Secondary | ICD-10-CM | POA: Diagnosis present

## 2011-01-26 DIAGNOSIS — E860 Dehydration: Secondary | ICD-10-CM | POA: Diagnosis present

## 2011-01-26 DIAGNOSIS — N179 Acute kidney failure, unspecified: Secondary | ICD-10-CM | POA: Diagnosis present

## 2011-01-26 DIAGNOSIS — Z951 Presence of aortocoronary bypass graft: Secondary | ICD-10-CM

## 2011-01-26 DIAGNOSIS — E785 Hyperlipidemia, unspecified: Secondary | ICD-10-CM | POA: Diagnosis present

## 2011-01-26 LAB — DIFFERENTIAL
Basophils Absolute: 0.1 10*3/uL (ref 0.0–0.1)
Basophils Relative: 1 % (ref 0–1)
Eosinophils Absolute: 0.2 10*3/uL (ref 0.0–0.7)
Monocytes Absolute: 0.6 10*3/uL (ref 0.1–1.0)
Monocytes Relative: 7 % (ref 3–12)

## 2011-01-26 LAB — COMPREHENSIVE METABOLIC PANEL
ALT: 17 U/L (ref 0–35)
AST: 16 U/L (ref 0–37)
Albumin: 4 g/dL (ref 3.5–5.2)
Calcium: 9.9 mg/dL (ref 8.4–10.5)
GFR calc Af Amer: 14 mL/min — ABNORMAL LOW (ref >60)
Potassium: 5.2 mEq/L — ABNORMAL HIGH (ref 3.5–5.1)
Sodium: 134 mEq/L — ABNORMAL LOW (ref 135–145)
Total Protein: 8.1 g/dL (ref 6.0–8.3)

## 2011-01-26 LAB — BASIC METABOLIC PANEL
CO2: 14 mEq/L — ABNORMAL LOW (ref 19–32)
Calcium: 9.4 mg/dL (ref 8.4–10.5)
GFR calc non Af Amer: 14 mL/min — ABNORMAL LOW (ref >60)
Sodium: 135 mEq/L (ref 135–145)

## 2011-01-26 LAB — CBC
MCH: 29.9 pg (ref 26.0–34.0)
MCHC: 34.5 g/dL (ref 30.0–36.0)
Platelets: 217 10*3/uL (ref 150–400)
RDW: 14 % (ref 11.5–15.5)

## 2011-01-26 LAB — POCT I-STAT 3, ART BLOOD GAS (G3+)
Patient temperature: 37
TCO2: 14 mmol/L (ref 0–100)
pH, Arterial: 7.305 — ABNORMAL LOW (ref 7.350–7.400)

## 2011-01-26 LAB — URINALYSIS, ROUTINE W REFLEX MICROSCOPIC
Bilirubin Urine: NEGATIVE
Glucose, UA: NEGATIVE mg/dL
Nitrite: NEGATIVE
Specific Gravity, Urine: 1.016 (ref 1.005–1.030)
pH: 5.5 (ref 5.0–8.0)

## 2011-01-26 LAB — CK TOTAL AND CKMB (NOT AT ARMC)
CK, MB: 2.8 ng/mL (ref 0.3–4.0)
Total CK: 123 U/L (ref 7–177)

## 2011-01-26 LAB — URINE MICROSCOPIC-ADD ON

## 2011-01-26 LAB — ETHANOL: Alcohol, Ethyl (B): <11 mg/dL (ref 0–11)

## 2011-01-26 LAB — RAPID URINE DRUG SCREEN, HOSP PERFORMED
Amphetamines: NOT DETECTED
Tetrahydrocannabinol: NOT DETECTED

## 2011-01-26 LAB — CREATININE, URINE, RANDOM: Creatinine, Urine: 130.82 mg/dL

## 2011-01-26 NOTE — Progress Notes (Signed)
icd check in clinic  

## 2011-01-26 NOTE — H&P (Signed)
Hospital Admission Note Date: 01/26/2011  Patient name: Dorothy Li Medical record number: [id number] Date of birth: 06/24/1946 Age: 65 y.o. Gender: female PCP: Norberto Sorenson, [facility]  Attending physician:     Dr. Doneen Poisson Resident [phone]):     Dr. Baltazar Apo [phone] Resident (R1):     Dr. Berlinda Last [phone]  Chief Complaint: hypotension  History of Present Illness:  Dorothy Li is a 65 year old woman with a PMH significant for CAD (s/p CABG in 4/11, AICD in 7/11), HTN, and CHF (EF=25% in 6/11) who presents to the ED for hypotension. She was at a 3 month follow up appointment at [facility] when her cardiologist sent her to the ED for hypotension. The patient reports she feels well overall, but she has felt dizzy and light headed during the past week. She states she feels as if the room is spinning, but denies any dizziness with change in position. The patient admits to feeling lightheaded when standing up and rising from the supine position. She notes that lying down helps to alleviate her symptoms. She says she drinks a lot of water and avoids going outside when it is hot. She denies any chest pain, palpitations, or shortness of breath.  On Friday  (7/20), she states she had an episode of syncope while she was standing in the doorway at home. She states she fell to the floor and her husband helped her to get up. She said seconds before the syncopal episode she felt as if she was going to pass out. The patient states her legs felt weak and heavy. She denied any tongue biting, incontinence, history of seizures, or head trauma. Afterwards, she says she felt tired and spent the remainder of the day in bed. The patient reports she now uses a walker to ambulate. She also often has numbness in her hands and feet. She also has recurrent swelling in her right knee for which her PCP treated with steroids one month ago.   In addition to her hypotension, the  patient also complains of left jaw pain for the past week. She says the pain is constant and intermittently worsens. She describes the pain as dull and radiating down her left neck. She states the pain worsens when she rests her hand against her chin and when she eats chewy foods. She admits she has been unable to eat as much because of the pain. However, she is still able to tolerate drinking liquids without pain exacerbation. The patient reports the pain improves when she bends over and puts her head between her knees.  She also notes a non-radiating pinpoint sharp pain in the middle of her forehead between her eyebrows. She states the pain comes on "suddenly when she least expects it." Initially, she thought her headaches were a result of excessive reading. However, she says she is still having symptoms although she has tried using her glasses and reading less, but she still has these headaches. She admits to photophobia and blurry vision during the headaches, but she denies phonophobia. She says the headaches have occurred multiple times a week. Additionally, she also complains of tongue numbness which waxes and wanes. She says it feels as if "someone is putting something on her tongue and pressing on it." She denies any difficulty swallowing, breathing, or chewing secondary to her tongue numbness.    Current Outpatient Prescriptions  Medication Sig  . aspirin 325 MG tablet Take 325 mg by mouth daily.    [name]  Calcium-D3 600-400 MG Take by mouth daily.   . carvedilol (COREG) 6.25 MG tablet Take 1 1/2 tablets twice daily  . enalapril (VASOTEC) 5 MG tablet Take 1 tab by 2 times daily.  . furosemide (LASIX) 20 MG tablet Take 1 tablet (20 mg total) daily.  . nitroGLYCERIN 0.4 MG SL tablet Place 0.4 mg  every 5 min prn  . rosuvastatin 40 MG tablet Take 1 tablet daily.  [name] spironolactone 25 MG tablet Take 25 mg by mouth daily.      Allergies: Penicillins  Past Medical History  Diagnosis Date  . HTN  (hypertension)   . ETOH abuse      Elevated AST, mildly elevated INR (? component of cirrhosis). Has now quit ETOH.   [name] Hyperlipemia   . Nephrolithiasis renal US 02/2010   . Cardiomyopathy     Echo (6/11) with EF 25%, diffuse hypokinesis, moderate LV dilation, decreased RV  systolic function but normal RV size, PA systolic pressure 35 mmHg. Lexiscan myoview (4/11): EF 29%,  diffuse hypokinesis, basal anteroseptal and apical scar, no ischemia.  The cardiomyopathy is likely due to a combination of CAD (LM stenosis) and ETOH abuse.   . VT (ventricular tachycardia)     Dual chamber Medtronic ICD placed 7/11  . CAD (coronary artery disease)     LHC (6/11) showed 75% ostial LM stenosis with significant damping upon catheter engagement, 60-70% mLAD, 60% ostial D1, EF 30%.  Patient had CABG with LIMA-LAD and SVG-OM2.   . Carotid stenosis     carotid dopplers (4/11) with 60-79% LICA stenosis.  Carotid dopplers (10/11): 60-79%  LICA stenosis.  . Syncope   . ARF (acute renal failure)     in setting dehydration with ACEI use - admission 02/2010 with Cr = 12.26 at that time and was determined to be of unclear etiology and most likely secondary to hypovolemia in the setting of ACEI and spironolactone use  .  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 Icd implantation     ICD-Medtronic    Remote - No  .  Coronary artery bypass grafting X2 (left internal mammary artery to the LAD, saphenous vein graft to the second obtuse marginal) in 2011  .  Cadiac Catheterization in 2011 (EF=25%)EF 25%   .  Hysterectomy for uterine fibroids at 65 years old  .  Status post dual-chamber Medtronic implantable cardioverter defibrillator, July 2011.   History   Social History  . Marital Status: Married   Social History Main Topics  . Smoking status: Former Games developer  . Alcohol Use: Former alcohol abuser  . 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 Married, lives in [place] smoking 4/11Prior moderate ETOH use (2 liquor drinks daily) but quit in 3/11. No drug use and negative urine drug screen in 2/11    Review of Systems: Per HPI General: 13lb weight loss in the past moth, fatigue, weakness. Head: no vomiting, nausea. Ears: no hearing loss. Neck:  Cardiac: no angina, dyspnea on exertion.  Respiratory: no asthma, cough, sputum, asthma.  GI: no change in appetite, nausea, vomitting. No constipation, diarrhea.  Urinary: no frequency, urgency, polyuria, hematuria.  MSK: upper back pain and left arm pain. Knee stiffness. No arthritis or gout.  Hematologic: history of anemia. No bleeding.  Psych: no anxiety or depression.   Physical Exam: T = 62F BP =  106/50 mmHg HR = 1min/min RR = 17/min O2 sat = 100% on RA BP lying down 86/55, P=74 BP standing 77/52, P=110  Constitutional: Vital signs reviewed.  Patient is a well-developed and well-nourished  in no acute distress and cooperative with exam. Alert and oriented x3.  Head: Normocephalic and atraumatic Ear: TM normal bilaterally Mouth: no erythema or exudates, MMM Eyes: PERRL, EOMI, conjunctivae normal, No scleral icterus.  Neck: Supple, Trachea midline normal ROM, No JVD, mass, thyromegaly, or carotid bruit present.  Cardiovascular: RRR, S1 normal, S2 normal, no MRG, pulses symmetric and intact bilaterally Pulmonary/Chest: CTAB, no wheezes, rales, or rhonchi Abdominal: Soft. Non-tender, non-distended, bowel sounds are normal, no masses, organomegaly, or guarding present.  GU: no CVA tenderness Musculoskeletal: No joint deformities, erythema, or stiffness, ROM full and no nontender Hematology: no cervical, inginal, or axillary adenopathy.  Neurological: A&O x3, Strenght is normal and symmetric bilaterally, cranial nerve II-XII are grossly intact, no focal motor deficit, sensory intact to light touch bilaterally.  Skin: Warm, dry and intact. No  rash, cyanosis, or clubbing.  Psychiatric: Normal mood and affect. speech and behavior is normal. Judgment and thought content normal. Cognition and memory are normal.    Lab results:   WBC                                      8.8                Hemoglobin (HGB)                         11.7       l        Hematocrit (HCT)                         33.9       l        MCV                                      86.7                Platelet Count (PLT)                     217               Sodium (NA)                              134        l       Potassium (K)                            5.2        h       Chloride                                 104                 CO2  12         l        Glucose                                  88                 BUN                                      65         h        Creatinine                               3.81       h        GFR, Est Non African American            12         l        GFR, Est African American                14         l                   Bilirubin, Total                         0.2        l        Alkaline Phosphatase                     90                  SGOT (AST)                               16                 SGPT (ALT)                               17                  Total  Protein                           8.1                 Albumin-Blood                            4.0                Calcium                                  9.9                  Color, Urine  YELLOW            YELLOW  Appearance                               CLOUDY     a      CLEAR  Specific Gravity                         1.016             1.005-1.030  pH                                       5.5               5.0-8.0  Urine Glucose                            NEGATIVE          NEG              Bilirubin                                NEGATIVE          NEG  Ketones                                   NEGATIVE          NEG                Blood                                    MODERATE   a      NEG  Protein                                  100        a      NEG             Urobilinogen                             0.2               0.0-1.0           Nitrite                                  NEGATIVE          NEG  Leukocytes                               SMALL      a      NEG  WBC / HPF  3-6               <3                 RBC / HPF                                7-10              <3                 Bacteria / HPF                           FEW        a      RARE   Troponin I                               <0.30             <0.30            Ng/mL  Beta Natriuretic Peptide                 591.0      h      0-125            pg/mL   pH, Blood Gas                            7.305      l      7.350-7.400  pCO2                                     25.7       l      35.0-45.0        mmHg  pO2, Blood Gas                           96.0              80.0-100.0       mmHg  Bicarbonate                              12.8       l      20.0-24.0        mEq/L  TCO2                                     14                0-100            mmol/L  Base Deficit                             12.0       h      0.0-2.0          mmol/L  Oxygen Saturation  97.0                               %   Lactic Acid, Venous                      1.4               0.5-2.2          Mmol/L  EKG- atrial pacing  Imaging results:  CXR:   IMPRESSION:   No evidence of acute cardiopulmonary disease.   Stable cardiomegaly.  Assessment & Plan by Problem:  1) HYPOTENSION - The broad differential includes hypovolemia, cardiac causes or arrythmia. The most likely cause is hypovolemia due to dehydration (decreased PO intake at home 2/2 jaw pain) as well as her home medications of beta blockers and diuretics. Her history and exam is also consistent with orthostasis, and pt has  h/o admission for hypotension that was fluid responsive. Cardiac causes are less likely; patient denied any chest pain or SOB, EKG showed atrial pacing, negative cardiac enzymes were negative.  BNP was elevated, but pts lungs are clear and she denies SOB.  Much less likely infection/sepsis given no fever/leukocytosis.  Plan:  1. Start NS  2. Serial bp's 3. Holding BP med 4. Consider pacemaker interrogation   2) ACUTE RENAL FAILURE-  Given her lack of other history, the most likely cause is pre-renal azotemia secondary to her probable dehydration. On her previous admission for hypotension, her creatinine was 12.0 and it returned to her baseline of 0.6 with fluid resuscitation.  1. Urine urea to calculate Fe urea (on Lasix) 2. Trend Cr    3) AG METABOLIC ACIDOSIS/HYPERKALEMIA - Patient presents w/ an anion gap and a metabolic acidosis (abg indicative of respiratory The etiology of this gap is unclear but the most probable cause is uremia secondary to AKI. Could also be 2/2 medications (aldactone).  Patient denied any ingestions (M,E,S), does not have diabetes (D), and has a normal lactate (L).  1. Fluid resuscitation 2. Trend the gap    4) HEADACHE/JAW PAIN- The differential includes TMJ, trigeminal neuralgia, and temporal arteritis. 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 Additionally, her pain is unilateral.  Given age, female, and worse c chewing, cannot r/o temporal arteritis currently.  Will get ESR.  Less likely trigeminal neuralgia given nature of pain, but given short duration of sx, still on ddx. 1. ESR  5) Syncope: likely related to orthostasis given history and exam, but cannot r/o arrhythmic cause given h/o VT and syncope.  However, has pacer in place that appears to be functioning properly on EKG.  Organic cardiac causes less likely.  Pt has known carotid stenosis but given benign neuro  exam, TIA/CVA less likely. --Continue to monitor, admit to tele

## 2011-01-26 NOTE — Assessment & Plan Note (Signed)
Patient is lightheaded and hypotensive today.  No fever/chills/dysuria/cough.  No evidence for infection.  Has been this way for 1.5 weeks now.  No recent med changes.  However, last year around this time she became profoundly dehydrated in the hot weather with severe ARF.  She does not have air conditioning.  She has not been eating or drinking well because she has pain in her left neck/supraclavicular area with chewing/swallowing.  She passed out Friday.  This sounds orthostatic.  ICD check today showed no arrhythmias.   I am going to send her to the ER for hydration and labs.  She will likely need to be admitted.

## 2011-01-26 NOTE — Assessment & Plan Note (Signed)
Repeat carotid dopplers in 6 months (10/12) after most recent carotids.  

## 2011-01-26 NOTE — Assessment & Plan Note (Signed)
Lipids close to goal in 6/12.

## 2011-01-26 NOTE — H&P (Incomplete)
Hospital Admission Note Date: 01/26/2011  Patient name: Dorothy Li Medical record number: [id number] Date of birth: 02-17-1946 Age: 65 y.o. Gender: female PCP: No primary provider on file.  Medical Service:  Attending physician:     Dr. Doneen Poisson Resident [phone]):     Dr. Baltazar Apo [phone] Resident (R1):     Dr. Berlinda Last [phone]  Chief Complaint: jaw pain  History of Present Illness:    Current Outpatient Prescriptions  Medication Sig  . aspirin 325 MG tablet Take 325 mg by mouth daily.    . Calcium-D3 600-400 MG Take by mouth daily.   . carvedilol (COREG) 6.25 MG tablet Take 1 1/2 tablets twice daily  . enalapril (VASOTEC) 5 MG tablet Take 1 tab by 2 times daily.  . furosemide (LASIX) 20 MG tablet Take 1 tablet (20 mg total) daily.  . nitroGLYCERIN 0.4 MG SL tablet Place 0.4 mg  every 5 min prn  . rosuvastatin 40 MG tablet Take 1 tablet daily.  [name] spironolactone 25 MG tablet Take 25 mg by mouth daily.      Allergies: Penicillins  Past Medical History  Diagnosis Date  . HTN (hypertension)   . ETOH abuse      Elevated AST, mildly elevated INR (? component of cirrhosis). Has now quit ETOH.   [name] Hyperlipemia   . Nephrolithiasis renal US 02/2010   . Cardiomyopathy     Echo (6/11) with EF 25%, diffuse hypokinesis, moderate LV dilation, decreased RV  systolic function but normal RV size, PA systolic pressure 35 mmHg. Lexiscan myoview (4/11): EF 29%,  diffuse hypokinesis, basal anteroseptal and apical scar, no ischemia.  The cardiomyopathy is likely due to a combination of CAD (LM stenosis) and ETOH abuse.   . VT (ventricular tachycardia)     Dual chamber Medtronic ICD placed 7/11  . CAD (coronary artery disease)     LHC (6/11) showed 75% ostial LM stenosis with significant damping upon catheter engagement, 60-70% mLAD, 60% ostial D1, EF 30%.  Patient had CABG with LIMA-LAD and SVG-OM2.   . Carotid stenosis     carotid dopplers (4/11) with 60-79% LICA stenosis.  Carotid  dopplers (10/11): 60-79%  LICA stenosis.  . Syncope   . ARF (acute renal failure)     in setting dehydration with ACEI use - admission 02/2010 with Cr = 12.26 at that time and was determined to be of unclear etiology and most likely secondary to hypovolemia in the setting of ACEI and spironolactone use    Past Surgical History  Procedure Date  . Icd implantation     ICD-Medtronic    Remote - No    History   Social History  . Marital Status: Married   Social History Main Topics  . Smoking status: Former Games developer  . Alcohol Use: Former alcohol abuser  . 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 Married, lives in [place] smoking 4/11Prior moderate ETOH use (2 liquor drinks daily) but quit in 3/11. No drug use and negative urine drug screen in 2/11    Review of Systems: Per HPI  Physical Exam: T = 68F BP = 106/50 mmHg HR = 65min/min RR = 17/min O2 sat = 100% on RA  Constitutional: Vital signs reviewed.  Patient is a well-developed and well-nourished  in no acute distress and cooperative with exam. Alert and oriented x3.  Head: Normocephalic and atraumatic Ear: TM normal bilaterally Mouth: no erythema or exudates,  MMM Eyes: PERRL, EOMI, conjunctivae normal, No scleral icterus.  Neck: Supple, Trachea midline normal ROM, No JVD, mass, thyromegaly, or carotid bruit present.  Cardiovascular: RRR, S1 normal, S2 normal, no MRG, pulses symmetric and intact bilaterally Pulmonary/Chest: CTAB, no wheezes, rales, or rhonchi Abdominal: Soft. Non-tender, non-distended, bowel sounds are normal, no masses, organomegaly, or guarding present.  GU: no CVA tenderness Musculoskeletal: No joint deformities, erythema, or stiffness, ROM full and no nontender Hematology: no cervical, inginal, or axillary adenopathy.  Neurological: A&O x3, Strenght is normal and symmetric bilaterally, cranial nerve II-XII are grossly intact, no focal motor deficit,  sensory intact to light touch bilaterally.  Skin: Warm, dry and intact. No rash, cyanosis, or clubbing.  Psychiatric: Normal mood and affect. speech and behavior is normal. Judgment and thought content normal. Cognition and memory are normal.    Lab results:   WBC                                      8.8                Hemoglobin (HGB)                         11.7       l        Hematocrit (HCT)                         33.9       l        MCV                                      86.7                Platelet Count (PLT)                     217               Sodium (NA)                              134        l       Potassium (K)                            5.2        h       Chloride                                 104                 CO2                                      12         l        Glucose  88                 BUN                                      65         h        Creatinine                               3.81       h        GFR, Est Non African American            12         l        GFR, Est African American                14         l                   Bilirubin, Total                         0.2        l        Alkaline Phosphatase                     90                  SGOT (AST)                               16                 SGPT (ALT)                               17                  Total  Protein                           8.1                 Albumin-Blood                            4.0                Calcium                                  9.9                  Color, Urine                             YELLOW            YELLOW  Appearance                               CLOUDY  a      CLEAR  Specific Gravity                         1.016             1.005-1.030  pH                                       5.5               5.0-8.0  Urine Glucose                            NEGATIVE          NEG              Bilirubin                                 NEGATIVE          NEG  Ketones                                  NEGATIVE          NEG                Blood                                    MODERATE   a      NEG  Protein                                  100        a      NEG             Urobilinogen                             0.2               0.0-1.0           Nitrite                                  NEGATIVE          NEG  Leukocytes                               SMALL      a      NEG  WBC / HPF                                3-6               <3                 RBC / HPF  7-10              <3                 Bacteria / HPF                           FEW        a      RARE  Imaging results:  CXR:   IMPRESSION:   No evidence of acute cardiopulmonary disease.   Stable cardiomegaly.  Assessment & Plan by Problem:  1) HYPOTENSION -   2) ACUTE RENAL FAILURE-  3) AG METABOLIC ACIDOSIS/HYPERKALEMIA -

## 2011-01-26 NOTE — Assessment & Plan Note (Signed)
Stable symptomatically (NYHA class II symptoms).  EF 25% in 6/11.  Has ICD.  Optivol fluid index below threshold with impedance high over the last few months.  She appears dehydrated and she is hypotensive.  She will need to hold Coreg, enalapril, Lasix, and spironolactone until she has had further evaluation and treatment at the hospital.

## 2011-01-26 NOTE — Patient Instructions (Addendum)
Go to [facility] emergency room for lab and fluids. Dr Shirlee Latch has talked with Dr Rosalia Hammers in the emergency room. I also talked with Meriam Sprague at [facility] ED triage.

## 2011-01-26 NOTE — Progress Notes (Signed)
PCP: Dr. Clelia Croft at [facility]  65 yo with history of cardiomyopathy s/p ICD, and CAD s/p CABG presents for followup.   Patient had a left heart cath in 4/11 which showed a 75% ostial left main stenosis. She had CABG x 2.  She has moderate to severe LV systolic dysfunction that I think is a mixed picture, due to both CAD and probably ETOH abuse.  Since her CABG, she has quit smoking and quit drinking ETOH. She had syncope and episodes of VT on monitor, so Medtronic ICD was placed in 7/11.    Patient is not doing well today.  For the last 10 days, she has had lightheadedness with standing then with sitting up.  Systolic BP is in the 70s today.  She is lightheaded sitting on the exam table.  She passed out last Friday after standing up but has not passed out since then.  She also reports pain in the left supraclavicular area that is worse when she chews.  She has not been eating or drinking well.  Her house has no air conditioning and she became profoundly dehydrated with ARF last summer.  I worry that this has happened again.  No chest pain.  No dyspnea.  ICD was checked today: no significant events (including last Friday) and optivol fluid impedance is high (not suggestive of volume overload).  She has continued to take all her meds.  No fever/chills/dysuria/cough.  No evidence for infection.    ECG: NSR, nonspecific T wave changes  Labs (8/11): BNP 181 Labs (9/11): K 4.4, creatinine 0.6 Labs (10/11): BNP 95 Labs (11/11): BNP 65, creatinine 0.8, K 4.7, LDL 86, HDL 55 Labs (12/11): K 4.5, creatinine 0.7 Labs (1/12): LDL 69, HDL 41 Labs (6/12): K 4.9, creatinine 0.97, LDL 82, HDL 45 ECG: NSR, nonspecific diffuse T wave changes  Allergies:  1)  ! Pcn  Past Medical History: 1. HTN 2. ETOH abuse: Elevated AST, mildly elevated INR (? component of cirrhosis). Has now quit ETOH.  3. History of ARF in setting of dehydration/ACEI. 4. Hyperlipidemia 5. Nephrolithiasis 6. Cardiomyopathy: Echo (4/11) with  EF 25%, diffuse hypokinesis, moderate LV dilation, decreased RV systolic function but normal RV size, PA systolic pressure 35 mmHg. Lexiscan myoview (4/11): EF 29%, diffuse hypokinesis, basal anteroseptal and apical scar, no ischemia.  The cardiomyopathy is likely due to a combination of CAD (LM stenosis) and ETOH abuse.  ANA and SPEP were negative.  Echo (6/11): EF 25%, mild diastolic dysfunction, normal RV, no pulmonary HTN 7. VT: Dual chamber Medtronic ICD placed 7/11 8. CAD: LHC (4/11) showed 75% ostial LM stenosis with significant damping upon catheter engagement, 60-70% mLAD, 60% ostial D1, EF 30%.  Patient had CABG with LIMA-LAD and SVG-OM2.  9. carotid stenosis: carotid dopplers (4/11) with 60-79% LICA stenosis.  Carotid dopplers (10/11): 60-79% LICA stenosis.  Carotids (4/12): 60-79% LICA.  10.  Syncope  Family History: Reviewed history from 10/08/2009 and no changes required. Sister with heart surgery, she is not sure why.   Social History: Reviewed history from 11/11/2009 and no changes required. 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 Married, lives in [place] Quit smoking 4/11 Prior moderate ETOH use (2 liquor drinks daily) but quit in 3/11.  No drug use and negative urine drug screen in 2/11  Review of Systems        All systems reviewed and negative except as per HPI.   Current Outpatient Prescriptions  Medication Sig Dispense Refill  . aspirin 325 MG  tablet Take 325 mg by mouth daily.        . Calcium Carbonate-Vitamin D (CALCIUM 600-D) 600-400 MG-UNIT per tablet Take by mouth daily.       . carvedilol (COREG) 6.25 MG tablet Take 1 1/2 tablets twice daily      . enalapril (VASOTEC) 5 MG tablet Take 1 tablet (5 mg total) by mouth 2 (two) times daily.  60 tablet  6  . furosemide (LASIX) 20 MG tablet Take 1 tablet (20 mg total) by mouth daily.  30 tablet  6  . rosuvastatin (CRESTOR) 40 MG tablet Take 1 tablet (40 mg total) by mouth daily.  30 tablet  6  .  spironolactone (ALDACTONE) 25 MG tablet Take 25 mg by mouth daily.        . nitroGLYCERIN (NITROSTAT) 0.4 MG SL tablet Place 0.4 mg under the tongue every 5 (five) minutes as needed.          BP 72/53  Pulse 112  Ht 5\' 11"  (1.803 m)  Wt 150 lb (68.04 kg)  BMI 20.92 kg/m2 General:  Well developed, well nourished, in no acute distress. Neck:  Neck supple, no JVD. No masses, thyromegaly or abnormal cervical nodes. Lungs:  Clear bilaterally to auscultation and percussion. Heart:  Non-displaced PMI, chest non-tender; regular rate and rhythm, S1, S2 without murmurs, rubs. +S4. Carotid upstroke normal, no bruit.  Pedals normal pulses. No edema, no varicosities. Abdomen:  Bowel sounds positive; abdomen soft and non-tender without masses, organomegaly, or hernias noted. No hepatosplenomegaly. Extremities:  No clubbing or cyanosis. Neurologic:  Alert and oriented x 3. Psych:  Normal affect. MSK:  Tenderness to palpation left supraclavicular area, no mass felt.

## 2011-01-27 DIAGNOSIS — I959 Hypotension, unspecified: Secondary | ICD-10-CM

## 2011-01-27 DIAGNOSIS — N19 Unspecified kidney failure: Secondary | ICD-10-CM

## 2011-01-27 LAB — ICD DEVICE OBSERVATION
AL AMPLITUDE: 2.6 mv
AL IMPEDENCE ICD: 513 Ohm
BAMS-0001: 170 {beats}/min
RV LEAD IMPEDENCE ICD: 475 Ohm
TZAT-0001ATACH: 1
TZAT-0002ATACH: NEGATIVE
TZAT-0002ATACH: NEGATIVE
TZAT-0011SLOWVT: 10 ms
TZAT-0011SLOWVT: 10 ms
TZAT-0012ATACH: 150 ms
TZAT-0012SLOWVT: 200 ms
TZAT-0012SLOWVT: 200 ms
TZAT-0013SLOWVT: 2
TZAT-0018ATACH: NEGATIVE
TZAT-0018SLOWVT: NEGATIVE
TZAT-0018SLOWVT: NEGATIVE
TZAT-0019ATACH: 6 V
TZAT-0019ATACH: 6 V
TZAT-0019ATACH: 6 V
TZAT-0019SLOWVT: 8 V
TZAT-0019SLOWVT: 8 V
TZAT-0020ATACH: 1.5 ms
TZAT-0020ATACH: 1.5 ms
TZAT-0020FASTVT: 1.5 ms
TZON-0003SLOWVT: 320 ms
TZON-0004SLOWVT: 24
TZON-0005SLOWVT: 12
TZST-0001ATACH: 4
TZST-0001ATACH: 5
TZST-0001FASTVT: 2
TZST-0001FASTVT: 3
TZST-0001FASTVT: 5
TZST-0001SLOWVT: 4
TZST-0001SLOWVT: 6
TZST-0002ATACH: NEGATIVE
TZST-0002ATACH: NEGATIVE
TZST-0002FASTVT: NEGATIVE
TZST-0003SLOWVT: 35 J

## 2011-01-27 LAB — CARDIAC PANEL(CRET KIN+CKTOT+MB+TROPI)
CK, MB: 2.8 ng/mL (ref 0.3–4.0)
Relative Index: 1.9 (ref 0.0–2.5)
Relative Index: 1.9 (ref 0.0–2.5)
Total CK: 150 U/L (ref 7–177)
Troponin I: <0.3 ng/mL (ref <0.30)

## 2011-01-27 LAB — BASIC METABOLIC PANEL
CO2: 17 mEq/L — ABNORMAL LOW (ref 19–32)
Calcium: 9.6 mg/dL (ref 8.4–10.5)
Chloride: 108 mEq/L (ref 96–112)
Creatinine, Ser: 2.57 mg/dL — ABNORMAL HIGH (ref 0.50–1.10)
GFR calc Af Amer: 23 mL/min — ABNORMAL LOW (ref >60)
Sodium: 137 mEq/L (ref 135–145)

## 2011-01-27 LAB — UREA NITROGEN, URINE: Urea Nitrogen, Ur: 441 mg/dL

## 2011-01-28 ENCOUNTER — Telehealth: Payer: Self-pay | Admitting: Cardiology

## 2011-01-28 NOTE — Telephone Encounter (Signed)
I talked with pt. 

## 2011-01-28 NOTE — Telephone Encounter (Signed)
Pt DC from hospital yesterday. Dr Shirlee Latch wants pt to hold Lasix until office visit with him 02/03/11. I talked with pt and she verbalized understanding.

## 2011-01-28 NOTE — Telephone Encounter (Signed)
Pt rtn call to anne °

## 2011-02-03 ENCOUNTER — Ambulatory Visit (INDEPENDENT_AMBULATORY_CARE_PROVIDER_SITE_OTHER): Payer: Self-pay | Admitting: *Deleted

## 2011-02-03 ENCOUNTER — Encounter: Payer: Self-pay | Admitting: Cardiology

## 2011-02-03 ENCOUNTER — Ambulatory Visit (INDEPENDENT_AMBULATORY_CARE_PROVIDER_SITE_OTHER): Payer: Self-pay | Admitting: Cardiology

## 2011-02-03 VITALS — BP 100/76 | HR 75 | Ht 68.5 in | Wt 153.8 lb

## 2011-02-03 DIAGNOSIS — E7800 Pure hypercholesterolemia, unspecified: Secondary | ICD-10-CM

## 2011-02-03 DIAGNOSIS — I472 Ventricular tachycardia, unspecified: Secondary | ICD-10-CM

## 2011-02-03 DIAGNOSIS — Z95810 Presence of automatic (implantable) cardiac defibrillator: Secondary | ICD-10-CM

## 2011-02-03 DIAGNOSIS — N19 Unspecified kidney failure: Secondary | ICD-10-CM

## 2011-02-03 DIAGNOSIS — I5022 Chronic systolic (congestive) heart failure: Secondary | ICD-10-CM

## 2011-02-03 DIAGNOSIS — N179 Acute kidney failure, unspecified: Secondary | ICD-10-CM

## 2011-02-03 DIAGNOSIS — I25810 Atherosclerosis of coronary artery bypass graft(s) without angina pectoris: Secondary | ICD-10-CM

## 2011-02-03 DIAGNOSIS — R0989 Other specified symptoms and signs involving the circulatory and respiratory systems: Secondary | ICD-10-CM

## 2011-02-03 LAB — BASIC METABOLIC PANEL
BUN: 19 mg/dL (ref 6–23)
CO2: 21 mEq/L (ref 19–32)
Calcium: 9.4 mg/dL (ref 8.4–10.5)
Creatinine, Ser: 1.1 mg/dL (ref 0.4–1.2)
Glucose, Bld: 91 mg/dL (ref 70–99)

## 2011-02-03 NOTE — Progress Notes (Signed)
icd check with icm 

## 2011-02-03 NOTE — Patient Instructions (Signed)
Please have lab work drawn today. (BMP)  Continue medications as listed.  See Tereso Newcomer in 1 month.

## 2011-02-04 DIAGNOSIS — N179 Acute kidney failure, unspecified: Secondary | ICD-10-CM | POA: Insufficient documentation

## 2011-02-04 NOTE — Assessment & Plan Note (Signed)
Lipids close to goal in 6/12.  

## 2011-02-04 NOTE — Progress Notes (Signed)
PCP: Dr. Clelia Croft at [facility]  65 yo with history of cardiomyopathy s/p ICD, and CAD s/p CABG presents for followup.   Patient had a left heart cath in 4/11 which showed a 75% ostial left main stenosis. She had CABG x 2.  She has moderate to severe LV systolic dysfunction that I think is a mixed picture, due to both CAD and probably ETOH abuse.  Since her CABG, she has quit smoking and quit drinking ETOH. She had syncope and episodes of VT on monitor, so Medtronic ICD was placed in 7/11.    At last appointment, patient was profoundly dehydrated.  SBP was in the 70s and creatinine was 3.36.  She does not have air conditioning at home and it had been very hot.  She had a similar episode around this time last year.  She was admitted and rehydrated. Creatinine had decreased to 2.57 by the time she was discharged.  BP today is 100/76.  She is no longer lightheaded with standing.  She is off Lasix and spironolactone and has cut enalapril back to 5 mg daily.  No exertional dyspnea.  She walks several blocks to the grocery store with no problem.    ECG: NSR, nonspecific T wave changes  Labs (8/11): BNP 181 Labs (9/11): K 4.4, creatinine 0.6 Labs (10/11): BNP 95 Labs (11/11): BNP 65, creatinine 0.8, K 4.7, LDL 86, HDL 55 Labs (12/11): K 4.5, creatinine 0.7 Labs (1/12): LDL 69, HDL 41 Labs (6/12): K 4.9, creatinine 0.97, LDL 82, HDL 45 Labs (7/12): creatinine 3.36 => 2.57  Allergies:  1)  ! Pcn  Past Medical History: 1. HTN 2. ETOH abuse: Elevated AST, mildly elevated INR (? component of cirrhosis). Has now quit ETOH.  3. History of ARF in setting of dehydration/ACEI in the summer of 2011 and again in 2012.  4. Hyperlipidemia 5. Nephrolithiasis 6. Cardiomyopathy: Echo (4/11) with EF 25%, diffuse hypokinesis, moderate LV dilation, decreased RV systolic function but normal RV size, PA systolic pressure 35 mmHg. Lexiscan myoview (4/11): EF 29%, diffuse hypokinesis, basal anteroseptal and apical scar, no  ischemia.  The cardiomyopathy is likely due to a combination of CAD (LM stenosis) and ETOH abuse.  ANA and SPEP were negative.  Echo (6/11): EF 25%, mild diastolic dysfunction, normal RV, no pulmonary HTN 7. VT: Dual chamber Medtronic ICD placed 7/11 8. CAD: LHC (4/11) showed 75% ostial LM stenosis with significant damping upon catheter engagement, 60-70% mLAD, 60% ostial D1, EF 30%.  Patient had CABG with LIMA-LAD and SVG-OM2.  9. carotid stenosis: carotid dopplers (4/11) with 60-79% LICA stenosis.  Carotid dopplers (10/11): 60-79% LICA stenosis.  Carotids (4/12): 60-79% LICA.  10.  Syncope  Family History: Sister with heart surgery, she is not sure why.   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  No drug use and negative urine drug screen in 2/11  Review of Systems        All systems reviewed and negative except as per HPI.   Current Outpatient Prescriptions  Medication Sig Dispense Refill  . aspirin 325 MG tablet Take 325 mg by mouth daily.        . Calcium Carbonate-Vitamin D (CALCIUM 600-D) 600-400 MG-UNIT per tablet Take by mouth daily.       . carvedilol (COREG) 6.25 MG tablet Take 1 1/2 tablets twice daily      .  enalapril (VASOTEC) 5 MG tablet Take 5 mg by mouth daily.        . nitroGLYCERIN (NITROSTAT) 0.4 MG SL tablet Place 0.4 mg under the tongue every 5 (five) minutes as needed.        . rosuvastatin (CRESTOR) 40 MG tablet Take 1 tablet (40 mg total) by mouth daily.  30 tablet  6    BP 100/76  Pulse 75  Ht 5' 8.5" (1.74 m)  Wt 153 lb 12.8 oz (69.763 kg)  BMI 23.04 kg/m2 General:  Well developed, well nourished, in no acute distress. Neck:  Neck supple, no JVD. No masses, thyromegaly or abnormal cervical nodes. Lungs:  Clear bilaterally to auscultation and percussion. Heart:  Non-displaced PMI, chest non-tender; regular rate and rhythm,  S1, S2 without murmurs, rubs. +S4. Carotid upstroke normal, no bruit.  Pedals normal pulses. No edema, no varicosities. Abdomen:  Bowel sounds positive; abdomen soft and non-tender without masses, organomegaly, or hernias noted. No hepatosplenomegaly. Extremities:  No clubbing or cyanosis. Neurologic:  Alert and oriented x 3. Psych:  Normal affect.

## 2011-02-04 NOTE — Assessment & Plan Note (Signed)
Check BMET today.  Hopefully creatinine is going to be nearing normal.  Patient will followup with PA in 1 month.  Will need to restart meds eventually except for Lasix.

## 2011-02-04 NOTE — Assessment & Plan Note (Signed)
Repeat carotid dopplers in 6 months (10/12) after most recent carotids.

## 2011-02-04 NOTE — Assessment & Plan Note (Signed)
Stable NYHA class II symptoms. Continue Coreg and enalapril at current doses.  Will leave off Lasix.  Will start back on spironolactone and titrate back up on enalapril when creatinine returns to baseline.

## 2011-02-04 NOTE — Assessment & Plan Note (Signed)
Stable with no ischemic symptoms.  Can decrease ASA dose to 81 mg daily.

## 2011-02-05 ENCOUNTER — Telehealth: Payer: Self-pay | Admitting: *Deleted

## 2011-02-05 DIAGNOSIS — I5022 Chronic systolic (congestive) heart failure: Secondary | ICD-10-CM

## 2011-02-05 NOTE — Telephone Encounter (Signed)
Message copied by Jacqlyn Krauss on Fri Feb 05, 2011  1:01 PM ------      Message from: Laurey Morale      Created: Wed Feb 03, 2011  5:04 PM       Creatinine is back to normal.  Can start back on spironolactone 12.5 mg daily.  BMET in 2 wks.

## 2011-02-05 NOTE — Telephone Encounter (Signed)
Notes Recorded by Jacqlyn Krauss, RN on 02/05/2011 at 1:01 PM Discussed with pt. She will restart Spironolactone 12.5mg  daily. She will return for [facility] 02/19/11. Notes Recorded by Marca Ancona, MD on 02/03/2011 at 5:04 PM Creatinine is back to normal. Can start back on spironolactone 12.5 mg daily. BMET in 2 wks.

## 2011-02-07 NOTE — Discharge Summary (Signed)
Dorothy Li, HELMKAMP NO.:  [id number]  MEDICAL RECORD NO.:  [id number]  LOCATION:  6705                         FACILITY:  [facility]  PHYSICIAN:  Doneen Poisson, MD     DATE OF BIRTH:  29-Oct-1945  DATE OF ADMISSION:  01/26/2011 DATE OF DISCHARGE:  01/27/2011                              DISCHARGE SUMMARY   DISCHARGE DIAGNOSES: 1. Hypotension, primarily secondary to dehydration and orthostasis. 2. Acute renal injury secondary to hypotension, dehydration. 3. Metabolic acidosis. 4. Syncope. 5. Left-sided jaw pain.  CHRONIC PROBLEMS: 1. 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 2. History of ventricular tachycardia status post dual chamber     implantable cardioverter defibrillator in July 2011. 3. Coronary artery disease status post left heart catheterization in     April 2011 status post coronary artery bypass graft, April 2011. 4. Hyperlipidemia. 5. Hypertension. 6. History of alcohol abuse, quit in 2011. 7. History of acute renal failure with admission in August 2011 with a     creatinine of 12.26.  DISCHARGE MEDICATIONS: 1. Aspirin 81 mg p.o. daily. 2. Calcium carbonate/vitamin D 600 mg/400 units 1 tablet p.o. daily. 3. Carvedilol 6.25 mg 1-1/2 tablets p.o. b.i.d. 4. Enalapril 5 mg p.o. daily. 5. Lasix 20 mg p.o. daily. 6. Rosuvastatin 40 mg p.o. daily.  DISPOSITION AND FOLLOWUP: 1. Appointment with Dr. Shirlee Latch of [facility] on February 10, 2011     at 11:00 a.m.  At this time, Ms. Kocsis's volume status and blood     pressure should be reassessed with the lowering of her dose     of Lasix given her recent orthostasis and acute kidney injury. 2. [facility] with Dr. Sherryll Burger.  Ms. Evitt has been     instructed to call for routine follow-up appointments.  PROCEDURES PERFORMED:  None.  CONSULTATIONS:  None.  BRIEF ADMITTING HISTORY AND PHYSICAL:  Dorothy Li is a 65 year old woman with a past  medical history significant for CAD, hypertension, and CHF who presented to the emergency department from her cardiologist's office for hypertension.  She feels well overall but has felt dizzy and lightheaded during the past week.  The Friday prior to admission, she admitted to syncopizing and feeling lightheaded at the edge of bed beforehand. She has been drinking a lot of water and has been avoiding going outside.  She denies any chest pain, palpitations, or shortness of breath.  The second episode of syncope did have a prodrome.  She felt as though she was going to faint.  She denied any tongue biting, incontinence, history of seizures or head trauma.  Afterwards, she felt tired and spent the remainder of day in bed.  She does use a walker to ambulate.  She also complains of left jaw pain for 1 week prior to admission.  The pain is constant and intermittently worsens.  It is dull and radiating down her left neck, worse with chewing.  ADMISSION PHYSICAL EXAMINATION: VITAL SIGNS:  Temperature 98, blood pressure 106/50, heart rate 60,  respiratory 17, and O2 saturation 100% on room air.    Orthostatic blood pressures: BP lying down 86/55  with a pulse of 74. BP standing 77/52, pulse of 110.  GENERAL:  Pleasant woman in no acute acute distress. CARDIOVASCULAR:  Regular rate and rhythm.  Normal S1-S2.  No murmurs, rubs, or gallops. PULMONARY: Clear to auscultation bilaterally.  No wheezes, rales, or rhonchi. ABDOMEN: Soft, nontender, and nondistended.  Normal bowel sounds. GU:  No CVA tenderness. EXTREMITIES:  No joint deformities, erythema, or edema. NEUROLOGIC: A and O x 3.  Strength was normal and symmetric bilaterally. Cranial nerves intact.  Sensation intact to light touch bilaterally in upper and lower extremities.  INITIAL LABORATORY RESULTS:  White blood cell count 8.8, hemoglobin 11.7,  hematocrit 33.9, platelets 217.  Sodium 134, potassium 5.2, chloride 104, CO2 12, BUN  65, creatinine 3.81, glucose 88.  Total bilirubin 0.2, alk  phos 90, AST 16, and ALT 17.  Urinalysis unremarkable.  BNP 591.  Troponin  less than 0.30.  Room air ABG:  pH 7.30, pCO2 25, pO2 96.  HOSPITAL COURSE BY PROBLEM: 1. Hypotension.  The broad differential for Ms. Cocozza's hypotension     included cardiac arrhythmia given her extensive cardiac     history and hypovolemia and orthostasis given her orthostatic vital     signs and her history of feeling dizzy upon standing and her recent     syncope.  She denied any chest pain or palpitations making a     cardiac etiology less likely.  Her BNP was slightly elevated but     her lungs were clear and she denied shortness of breath.     She was on multiple antihypertensive medicines including     beta-blockaders, ACE inhibition, and aldosterone antagonist as well     as Lasix.  She said that she has been trying to modify her     lifestyle, eat healthier which may be lowering her blood pressure     while maintaining a regular dose of antihypertensive medicines     leading to progressive hypotension.  She does have a prior     admission for similar symptoms and responded well to IV hydration at     that time.  She was bolused with 1 L of normal saline in     the emergency room and given another liter over 10 hours overnight     following admission.  The morning following admission, she     felt much improved with a stable blood pressure at 100/59 and heart     rate of 74.  Blood pressure medicines were held upon admission and     it was decided that her spironolactone would be held at the time of      discharge.  Her Coreg and/or ACE inhibitor were reinstituted as well      as her Lasix but at her follow-up appointment with her cardiologist      it should be determined whether her Lasix should be continued and/or      what antihypertensives/anti-heart failure regimen should be titrated      further.  2. Acute renal failure.  Ms. Marrufo  presented with a creatinine of     3.8, which is above her normal creatinine of 0.6.  She does have a     history of an episode of acute renal injury with a creatinine of 12.2     which responded to fluids.  Similarly this admission, her     fractional excretion of urea indicated a likely prerenal etiology and     she  was given fluids.  Her creatinine immediately started     to trend downwards and the morning following admission was already     down to 2.5.  3. Anion gap acidosis.  Ms. Boruff initially had a metabolic anion gap      acidosis with appropriate respiratory compensation likely related to     acute uremia.  Upon fluid rehydration, the gap closed by discharge.  4. Jaw pain.  Ms. Alsobrook describes a 1-2 week history of left-sided     jaw pain that radiates down the left side of the neck and is worse     with chewing.  Possible etiologies include temporomandibular joint     dysfunction, trigeminal neuralgia, and temporal arteritis.  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  The ESR      was elevated but only to 46 and without severe unilateral headaches      it was felt she did not have temporal arteritis.  5. Syncope.  It was felt that just as her hypotension and acute     renal failure were related to her dehydration so was her syncope.     However, she does have a pacemaker and a history of syncope related      to ventricular tachycardia.  Nonetheless, it was felt that this was      not a cardiac related syncope.  DISCHARGE VITAL SIGNS:  Blood pressure 97/63, pulse 80, respiratory rate  20, O2 saturation 98% on room air, temperature 98.2.  DISCHARGE LABORATORY DATA:  Sodium 138, potassium 4.4, chloride 111, CO2 15, BUN 55, creatinine 2.57, glucose 95.   ______________________________ Kathreen Cosier, MD   ______________________________ Doneen Poisson, MD   BW/MEDQ   D:  01/27/2011  T:  01/27/2011  Job:  [id number]  cc:   [facility] Marca Ancona, MD  Electronically Signed by Kathreen Cosier MD on 02/01/2011 03:34:57 PM Electronically Signed by Doneen Poisson  on 02/07/2011 11:11:32 AM

## 2011-02-10 ENCOUNTER — Encounter: Payer: Self-pay | Admitting: *Deleted

## 2011-02-10 ENCOUNTER — Encounter: Payer: Self-pay | Admitting: Physician Assistant

## 2011-02-19 ENCOUNTER — Other Ambulatory Visit (INDEPENDENT_AMBULATORY_CARE_PROVIDER_SITE_OTHER): Payer: Self-pay | Admitting: *Deleted

## 2011-02-19 DIAGNOSIS — I5022 Chronic systolic (congestive) heart failure: Secondary | ICD-10-CM

## 2011-02-19 LAB — BASIC METABOLIC PANEL
CO2: 20 mEq/L (ref 19–32)
GFR: 74.17 mL/min (ref >60.00)
Glucose, Bld: 91 mg/dL (ref 70–99)
Potassium: 4.1 mEq/L (ref 3.5–5.1)
Sodium: 144 mEq/L (ref 135–145)

## 2011-02-23 ENCOUNTER — Encounter: Payer: Self-pay | Admitting: Cardiology

## 2011-03-10 ENCOUNTER — Encounter: Payer: Self-pay | Admitting: Internal Medicine

## 2011-03-10 ENCOUNTER — Ambulatory Visit (INDEPENDENT_AMBULATORY_CARE_PROVIDER_SITE_OTHER): Payer: Self-pay | Admitting: Physician Assistant

## 2011-03-10 ENCOUNTER — Encounter: Payer: Self-pay | Admitting: Physician Assistant

## 2011-03-10 ENCOUNTER — Ambulatory Visit (INDEPENDENT_AMBULATORY_CARE_PROVIDER_SITE_OTHER): Payer: Self-pay | Admitting: *Deleted

## 2011-03-10 VITALS — BP 108/67 | HR 86 | Ht 68.0 in | Wt 157.0 lb

## 2011-03-10 DIAGNOSIS — I472 Ventricular tachycardia: Secondary | ICD-10-CM

## 2011-03-10 DIAGNOSIS — I25810 Atherosclerosis of coronary artery bypass graft(s) without angina pectoris: Secondary | ICD-10-CM

## 2011-03-10 DIAGNOSIS — I10 Essential (primary) hypertension: Secondary | ICD-10-CM

## 2011-03-10 DIAGNOSIS — R42 Dizziness and giddiness: Secondary | ICD-10-CM | POA: Insufficient documentation

## 2011-03-10 DIAGNOSIS — Z95810 Presence of automatic (implantable) cardiac defibrillator: Secondary | ICD-10-CM

## 2011-03-10 DIAGNOSIS — I5022 Chronic systolic (congestive) heart failure: Secondary | ICD-10-CM

## 2011-03-10 LAB — ICD DEVICE OBSERVATION
TZAT-0001ATACH: 2
TZAT-0001ATACH: 3
TZAT-0001FASTVT: 1
TZAT-0001SLOWVT: 1
TZAT-0001SLOWVT: 2
TZAT-0002ATACH: NEGATIVE
TZAT-0004SLOWVT: 8
TZAT-0004SLOWVT: 8
TZAT-0012ATACH: 150 ms
TZAT-0012ATACH: 150 ms
TZAT-0012ATACH: 150 ms
TZAT-0012FASTVT: 200 ms
TZAT-0018ATACH: NEGATIVE
TZAT-0018FASTVT: NEGATIVE
TZAT-0019ATACH: 6 V
TZAT-0019SLOWVT: 8 V
TZAT-0019SLOWVT: 8 V
TZAT-0020ATACH: 1.5 ms
TZAT-0020ATACH: 1.5 ms
TZAT-0020SLOWVT: 1.5 ms
TZAT-0020SLOWVT: 1.5 ms
TZON-0003ATACH: 350 ms
TZON-0003VSLOWVT: 450 ms
TZON-0004VSLOWVT: 20
TZST-0001ATACH: 4
TZST-0001ATACH: 6
TZST-0001FASTVT: 2
TZST-0001FASTVT: 4
TZST-0001FASTVT: 5
TZST-0001FASTVT: 6
TZST-0001SLOWVT: 3
TZST-0001SLOWVT: 5
TZST-0002ATACH: NEGATIVE
TZST-0002ATACH: NEGATIVE
TZST-0002FASTVT: NEGATIVE
TZST-0002FASTVT: NEGATIVE
TZST-0003SLOWVT: 25 J
TZST-0003SLOWVT: 35 J

## 2011-03-10 LAB — BASIC METABOLIC PANEL
BUN: 18 mg/dL (ref 6–23)
CO2: 23 mEq/L (ref 19–32)
Chloride: 109 mEq/L (ref 96–112)
Creatinine, Ser: 0.8 mg/dL (ref 0.4–1.2)

## 2011-03-10 MED ORDER — SPIRONOLACTONE 25 MG PO TABS: ORAL_TABLET | ORAL | Status: DC

## 2011-03-10 MED ORDER — ENALAPRIL MALEATE 5 MG PO TABS: 2.5000 mg | ORAL_TABLET | Freq: Every day | ORAL | Status: DC

## 2011-03-10 MED ORDER — FUROSEMIDE 20 MG PO TABS: ORAL_TABLET | ORAL | Status: DC

## 2011-03-10 NOTE — Assessment & Plan Note (Signed)
She is somewhat orthostatic.  She does have an increase in her HR with lying to standing, but her BPs are stable.  I do not think there is much room to cut back on her medications.  As noted, I have given her some lasix to correct her volume.  She will need to be careful with standing.  I have suggested she try to get some compression stockings OTC to wear daily.  If needed, we could give her a Rx for graduated compression stockings.

## 2011-03-10 NOTE — Assessment & Plan Note (Signed)
We interrogated her device today and her optivol is going up.  She is currently class 1-2.  However, I will have her take Lasix 20 mg QD for 2 days.  She will weigh herself daily.  She will then, take Lasix 20 mg QD PRN for a weight gain of 2 pounds or more in one day.  She will have a BMET today and a BNP.  These will be repeated in 1 week.  She can follow up with me in 3-4 weeks.

## 2011-03-10 NOTE — Patient Instructions (Signed)
Your physician recommends that you schedule a follow-up appointment in: 3-4 WEEKS WITH DR. Shirlee Latch AS PER SCOTT WEAVER, PA-C  Your physician recommends that you return for lab work in: TODAY BMET [phone]   Your physician recommends that you return for lab work in: REPEAT BMET 03/18/11 [phone]  Your physician has recommended you make the following change in your medication: START LASIX 20 MG DAILY FOR 2 DAYS ONLY THEN STOP; WEIGH YOURSELF DAILY AND KEEP A RECORD OF WEIGHTS AND IF YOUR WEIGHT IS 2 OR MORE LB'S OVER IN 1 DAY THEN TAKE LASIX 20 MG

## 2011-03-10 NOTE — Assessment & Plan Note (Signed)
Stable.  No angina.  Continue ASA and statin. 

## 2011-03-10 NOTE — Progress Notes (Signed)
History of Present Illness: Primary Cardiologist:  Dr. Marca Ancona PCP: Dr. Clelia Croft at [facility] Dorothy Li is a 65 y.o. female with history of cardiomyopathy s/p ICD, and CAD s/p CABG presents for followup.   She had a left heart cath in 4/11 which showed a 75% ostial left main stenosis.  She had CABG x 2.  She has moderate to severe LV systolic dysfunction that is likely a mixed picture, due to both CAD and probably ETOH abuse.  Since her CABG, she has quit smoking and quit drinking ETOH.  She had syncope and episodes of VT on monitor, so Medtronic ICD was placed in 7/11.    She was admx from the office in 7/12 with dehydration.  SBP was in the 70s and creatinine was 3.36.  She was admitted and rehydrated. Creatinine had decreased to 2.57 by the time she was discharged.  Creatinine was checked when she saw Dr. Shirlee Latch several weeks ago and it was back to normal.  She was put back on spironolactone 12.5 mg QD.  She returns for follow up.  Labs (8/11): BNP 181 Labs (9/11): K 4.4, creatinine 0.6 Labs (10/11): BNP 95 Labs (11/11): BNP 65, creatinine 0.8, K 4.7, LDL 86, HDL 55 Labs (12/11): K 4.5, creatinine 0.7 Labs (1/12): LDL 69, HDL 41 Labs (6/12): K 4.9, creatinine 0.97, LDL 82, HDL 45 Labs (7/12): creatinine 3.36 => 2.57 Labs (02/03/11):  K 4.3, creat 1.1 Labs (02/19/11): K 4.1, creat 1.0  She is doing ok.  She describes a "sinking" sensation.  This has gotten better with decreasing her medications.  No chest pain.  No significant DOE.  She describes class 1-2 symptoms.  She denies orthopnea, PND, edema.  She has not been weighing herself.  She denies syncope or near syncope.  She denies facial droop, difficulty with speech or unilateral weakness.  She denies symptoms of amaurosis fugax.  She denies lightheadedness with standing.    Past Medical History: 1. HTN 2. ETOH abuse: Elevated AST, mildly elevated INR (? component of cirrhosis). Has now quit ETOH.  3. History of ARF in setting of  dehydration/ACEI in the summer of 2011 and again in 2012.  4. Hyperlipidemia 5. Nephrolithiasis 6. Cardiomyopathy: Echo (4/11) with EF 25%, diffuse hypokinesis, moderate LV dilation, decreased RV systolic function but normal RV size, PA systolic pressure 35 mmHg. Lexiscan myoview (4/11): EF 29%, diffuse hypokinesis, basal anteroseptal and apical scar, no ischemia.  The cardiomyopathy is likely due to a combination of CAD (LM stenosis) and ETOH abuse.  ANA and SPEP were negative.  Echo (6/11): EF 25%, mild diastolic dysfunction, normal RV, no pulmonary HTN 7. VT: Dual chamber Medtronic ICD placed 7/11 8. CAD: LHC (4/11) showed 75% ostial LM stenosis with significant damping upon catheter engagement, 60-70% mLAD, 60% ostial D1, EF 30%.  Patient had CABG with LIMA-LAD and SVG-OM2.  9. carotid stenosis: carotid dopplers (4/11) with 60-79% LICA stenosis.  Carotid dopplers (10/11): 60-79% LICA stenosis.  Carotids (4/12): 60-79% LICA.  10.  Syncope   Current Outpatient Prescriptions  Medication Sig Dispense Refill  . aspirin 325 MG tablet Take 325 mg by mouth daily.        . Calcium Carbonate-Vitamin D (CALCIUM 600-D) 600-400 MG-UNIT per tablet Take by mouth daily.       . carvedilol (COREG) 6.25 MG tablet Take 1 1/2 tablets twice daily      . enalapril (VASOTEC) 5 MG tablet Take 2.5 mg by mouth daily.       [name]  rosuvastatin (CRESTOR) 40 MG tablet Take 1 tablet (40 mg total) by mouth daily.  30 tablet  6  . spironolactone (ALDACTONE) 25 MG tablet Take 1/2 tablet daily      . nitroGLYCERIN (NITROSTAT) 0.4 MG SL tablet Place 0.4 mg under the tongue every 5 (five) minutes as needed.          Allergies: Allergies  Allergen Reactions  . Penicillins     Family History: Sister with heart surgery, she is not sure why.   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 Married, lives in [place] Quit smoking 4/11 Prior moderate ETOH use (2 liquor drinks daily) but quit in 3/11.  No drug  use and negative urine drug screen in 2/11  ROS:  Please see the history of present illness.  All other systems reviewed and negative.   Vital Signs: BP 114/72  Pulse 68  Ht 5\' 8"  (1.727 m)  Wt 157 lb (71.215 kg)  BMI 23.87 kg/m2  Filed Vitals:   03/10/11 1235 03/10/11 1236 03/10/11 1238 03/10/11 1241  BP: 113/70 111/69 105/66 108/67  Pulse: 64 82 88 86  Height:      Weight:         PHYSICAL EXAM: Well nourished, well developed, in no acute distress HEENT: normal Neck: no JVD Endocrine:  No thyromegaly Cardiac:  normal S1, S2; RRR; no murmur Lungs:  clear to auscultation bilaterally, no wheezing, rhonchi or rales Abd: soft, nontender, no hepatomegaly Ext: no edema Skin: warm and dry Neuro:  CNs 2-12 intact, no focal abnormalities noted   ASSESSMENT AND PLAN:

## 2011-03-10 NOTE — Progress Notes (Signed)
Interrogation only  

## 2011-03-10 NOTE — Assessment & Plan Note (Signed)
BP is controlled 

## 2011-03-10 NOTE — Assessment & Plan Note (Signed)
We interrogated her device today.  She has not had any episodes of VTach.  As noted, her optivol is up and I have adjusted her Lasix.

## 2011-03-15 NOTE — Progress Notes (Signed)
Agree with note.  Dorothy Li  

## 2011-03-18 ENCOUNTER — Other Ambulatory Visit (INDEPENDENT_AMBULATORY_CARE_PROVIDER_SITE_OTHER): Payer: Self-pay | Admitting: *Deleted

## 2011-03-18 DIAGNOSIS — I5022 Chronic systolic (congestive) heart failure: Secondary | ICD-10-CM

## 2011-03-18 LAB — BASIC METABOLIC PANEL
BUN: 14 mg/dL (ref 6–23)
Chloride: 113 mEq/L — ABNORMAL HIGH (ref 96–112)
GFR: 81.89 mL/min (ref >60.00)
Potassium: 3.7 mEq/L (ref 3.5–5.1)
Sodium: 145 mEq/L (ref 135–145)

## 2011-03-18 LAB — BRAIN NATRIURETIC PEPTIDE: Pro B Natriuretic peptide (BNP): 65 pg/mL (ref 0.0–100.0)

## 2011-04-08 ENCOUNTER — Encounter: Payer: Self-pay | Admitting: Cardiology

## 2011-04-08 ENCOUNTER — Ambulatory Visit (INDEPENDENT_AMBULATORY_CARE_PROVIDER_SITE_OTHER): Payer: Medicare Other | Admitting: Cardiology

## 2011-04-08 DIAGNOSIS — I5022 Chronic systolic (congestive) heart failure: Secondary | ICD-10-CM

## 2011-04-08 DIAGNOSIS — I6529 Occlusion and stenosis of unspecified carotid artery: Secondary | ICD-10-CM

## 2011-04-08 DIAGNOSIS — I10 Essential (primary) hypertension: Secondary | ICD-10-CM

## 2011-04-08 DIAGNOSIS — I25810 Atherosclerosis of coronary artery bypass graft(s) without angina pectoris: Secondary | ICD-10-CM

## 2011-04-08 MED ORDER — ENALAPRIL MALEATE 2.5 MG PO TABS: 2.5000 mg | ORAL_TABLET | Freq: Two times a day (BID) | ORAL | Status: DC

## 2011-04-08 MED ORDER — POTASSIUM CHLORIDE 10 MEQ PO TBCR: EXTENDED_RELEASE_TABLET | ORAL | Status: DC

## 2011-04-08 MED ORDER — FUROSEMIDE 20 MG PO TABS: ORAL_TABLET | ORAL | Status: DC

## 2011-04-08 MED ORDER — SPIRONOLACTONE 25 MG PO TABS: 25.0000 mg | ORAL_TABLET | Freq: Every day | ORAL | Status: DC

## 2011-04-08 NOTE — Patient Instructions (Signed)
Take lasix(furosemide) 20mg  every other day.  Take KCl(potassium) 10 mEq every other day.  Take spironolactone 25mg  daily--this will be one tablet daily.  Take enalapril 2.5mg  twice a day.  Use tylenol as needed for shoulder pain.  Your physician recommends that you return for lab work in: 2 weeks --BMP 428.22 401.9  Your physician has requested that you have a carotid duplex. This test is an ultrasound of the carotid arteries in your neck. It looks at blood flow through these arteries that supply the brain with blood. Allow one hour for this exam. There are no restrictions or special instructions. October 2012.  Your physician recommends that you schedule a follow-up appointment in: 2 months with Dr Shirlee Latch.

## 2011-04-08 NOTE — Assessment & Plan Note (Signed)
Repeat carotid dopplers to be done this month.

## 2011-04-08 NOTE — Assessment & Plan Note (Signed)
Stable with no ischemic symptoms.  Continue ASA 81, beta blocker, ACEI, statin.  

## 2011-04-08 NOTE — Assessment & Plan Note (Signed)
Stable NYHA class II symptoms. Continue Coreg.  I am going to increase her enalapril to 2.5 mg bid and her spironolactone to 25 mg daily.  I will also have her start back on Lasix 20 mg scheduled every other day and KCl 10 mEq every other day given fall in thoracic impedance.  BMET in 2 weeks. Followup in the office in 2 months.

## 2011-04-08 NOTE — Progress Notes (Signed)
PCP: Dr. Clelia Croft at [facility]  65 yo with history of cardiomyopathy s/p ICD, and CAD s/p CABG presents for followup.   Patient had a left heart cath in 4/11 which showed a 75% ostial left main stenosis. She had CABG x 2.  She has moderate to severe LV systolic dysfunction that I think is a mixed picture, due to both CAD and probably ETOH abuse.  Since her CABG, she has quit smoking and quit drinking ETOH. She had syncope and episodes of VT on monitor, so Medtronic ICD was placed in 7/11.  This summer, patient had an episode of profound dehydration with renal failure (house not air conditioned, poor po intake with use of Lasix).  This resolved with rehydration.   Patient has been doing reasonably well recently.  No lightheaded spells.  She has been taking Lasix every 3-4 days based on weight changes.  Weight is stable compared to prior appointment. She denies dyspnea walking on flat ground or going up steps.  No chest pain.  She has chronic bothersome left shoulder and bilateral knee pain.  She does not take any medications for the pain due to concern for cardiac side effects.  I checked her Optivol today.  Thoracic impedance is trending down signifying some volume retention though there has not been a threshold crossing.   Labs (8/11): BNP 181 Labs (9/11): K 4.4, creatinine 0.6 Labs (10/11): BNP 95 Labs (11/11): BNP 65, creatinine 0.8, K 4.7, LDL 86, HDL 55 Labs (12/11): K 4.5, creatinine 0.7 Labs (1/12): LDL 69, HDL 41 Labs (6/12): K 4.9, creatinine 0.97, LDL 82, HDL 45 Labs (7/12): creatinine 3.36 => 2.57 Labs (9/12): K 3.7, creatinine 0.9, BNP 65  Allergies:  1)  ! Pcn  Past Medical History: 1. HTN 2. ETOH abuse: Elevated AST, mildly elevated INR (? component of cirrhosis). Has now quit ETOH.  3. History of ARF in setting of dehydration/ACEI in the summer of 2011 and again in 2012.  4. Hyperlipidemia 5. Nephrolithiasis 6. Cardiomyopathy: Echo (4/11) with EF 25%, diffuse hypokinesis,  moderate LV dilation, decreased RV systolic function but normal RV size, PA systolic pressure 35 mmHg. Lexiscan myoview (4/11): EF 29%, diffuse hypokinesis, basal anteroseptal and apical scar, no ischemia.  The cardiomyopathy is likely due to a combination of CAD (LM stenosis) and ETOH abuse.  ANA and SPEP were negative.  Echo (6/11): EF 25%, mild diastolic dysfunction, normal RV, no pulmonary HTN 7. VT: Dual chamber Medtronic ICD placed 7/11 8. CAD: LHC (4/11) showed 75% ostial LM stenosis with significant damping upon catheter engagement, 60-70% mLAD, 60% ostial D1, EF 30%.  Patient had CABG with LIMA-LAD and SVG-OM2.  9. carotid stenosis: carotid dopplers (4/11) with 60-79% LICA stenosis.  Carotid dopplers (10/11): 60-79% LICA stenosis.  Carotids (4/12): 60-79% LICA.  10.  Syncope  Family History: Sister with heart surgery, she is not sure why.   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  No drug use and negative urine drug screen in 2/11  Review of Systems        All systems reviewed and negative except as per HPI.   Current Outpatient Prescriptions  Medication Sig Dispense Refill  . Calcium Carbonate-Vitamin D (CALCIUM 600-D) 600-400 MG-UNIT per tablet Take by mouth daily.       . carvedilol (COREG) 6.25 MG tablet Take 1 1/2 tablets twice daily      .  nitroGLYCERIN (NITROSTAT) 0.4 MG SL tablet Place 0.4 mg under the tongue every 5 (five) minutes as needed.        . rosuvastatin (CRESTOR) 40 MG tablet Take 1 tablet (40 mg total) by mouth daily.  30 tablet  6  . DISCONTD: aspirin 325 MG tablet Take 325 mg by mouth daily.        [name] DISCONTD: enalapril (VASOTEC) 5 MG tablet Take 0.5 tablets (2.5 mg total) by mouth daily.  30 tablet  6  . DISCONTD: furosemide (LASIX) 20 MG tablet TAKE 1 TABLET DAILY FOR 2 DAYS THEN STOP ONLY TAKE LASIX IF WEIGHT IS INCREASED BY  2 OR MORE LB'S IN ONE DAY  30 tablet  3  . DISCONTD: spironolactone (ALDACTONE) 25 MG tablet Take 1/2 tablet daily  30 tablet  6  . acetaminophen (TYLENOL) 325 MG tablet Use two tablets as needed for shoulder pain      . aspirin EC 81 MG tablet Take 1 tablet (81 mg total) by mouth daily.      . enalapril (VASOTEC) 2.5 MG tablet Take 1 tablet (2.5 mg total) by mouth 2 (two) times daily.  60 tablet  6  . furosemide (LASIX) 20 MG tablet Take one every other day  15 tablet  6  . potassium chloride (KLOR-CON 10) 10 MEQ CR tablet Take one every other day  15 tablet  6  . spironolactone (ALDACTONE) 25 MG tablet Take 1 tablet (25 mg total) by mouth daily.  30 tablet  6    BP 118/62  Pulse 72  Ht 5\' 9"  (1.753 m)  Wt 157 lb (71.215 kg)  BMI 23.18 kg/m2 General:  Well developed, well nourished, in no acute distress. Neck:  Neck supple, no JVD. No masses, thyromegaly or abnormal cervical nodes. Lungs:  Clear bilaterally to auscultation and percussion. Heart:  Non-displaced PMI, chest non-tender; regular rate and rhythm, S1, S2 without murmurs, rubs. +S4. Carotid upstroke normal, no bruit.  Pedals normal pulses. No edema, no varicosities. Abdomen:  Bowel sounds positive; abdomen soft and non-tender without masses, organomegaly, or hernias noted. No hepatosplenomegaly. Extremities:  No clubbing or cyanosis. Neurologic:  Alert and oriented x 3. Psych:  Normal affect.

## 2011-04-22 ENCOUNTER — Other Ambulatory Visit (INDEPENDENT_AMBULATORY_CARE_PROVIDER_SITE_OTHER): Payer: Medicare Other | Admitting: *Deleted

## 2011-04-22 DIAGNOSIS — I10 Essential (primary) hypertension: Secondary | ICD-10-CM

## 2011-04-22 DIAGNOSIS — I5022 Chronic systolic (congestive) heart failure: Secondary | ICD-10-CM

## 2011-04-22 LAB — BASIC METABOLIC PANEL
CO2: 25 mEq/L (ref 19–32)
Chloride: 107 mEq/L (ref 96–112)
Creatinine, Ser: 0.8 mg/dL (ref 0.4–1.2)
Sodium: 140 mEq/L (ref 135–145)

## 2011-05-06 ENCOUNTER — Ambulatory Visit (HOSPITAL_COMMUNITY)
Admission: RE | Admit: 2011-05-06 | Discharge: 2011-05-06 | Disposition: A | Discharge status: Home / Self Care | Payer: Medicare Other | Source: Ambulatory Visit | Attending: Family Medicine | Admitting: Family Medicine

## 2011-05-06 ENCOUNTER — Other Ambulatory Visit (HOSPITAL_COMMUNITY): Payer: Self-pay | Admitting: Family Medicine

## 2011-05-06 DIAGNOSIS — R52 Pain, unspecified: Secondary | ICD-10-CM

## 2011-05-06 DIAGNOSIS — Z01818 Encounter for other preprocedural examination: Secondary | ICD-10-CM | POA: Insufficient documentation

## 2011-05-10 ENCOUNTER — Ambulatory Visit (INDEPENDENT_AMBULATORY_CARE_PROVIDER_SITE_OTHER): Payer: Medicare Other | Admitting: *Deleted

## 2011-05-10 ENCOUNTER — Encounter (INDEPENDENT_AMBULATORY_CARE_PROVIDER_SITE_OTHER): Payer: Medicare Other | Admitting: Cardiology

## 2011-05-10 DIAGNOSIS — I472 Ventricular tachycardia, unspecified: Secondary | ICD-10-CM

## 2011-05-10 DIAGNOSIS — I509 Heart failure, unspecified: Secondary | ICD-10-CM

## 2011-05-10 DIAGNOSIS — I6529 Occlusion and stenosis of unspecified carotid artery: Secondary | ICD-10-CM

## 2011-05-10 DIAGNOSIS — I428 Other cardiomyopathies: Secondary | ICD-10-CM

## 2011-05-10 LAB — ICD DEVICE OBSERVATION
AL IMPEDENCE ICD: 513 Ohm
ATRIAL PACING ICD: 0.6 pct
RV LEAD IMPEDENCE ICD: 418 Ohm
TZAT-0001ATACH: 1
TZAT-0001ATACH: 2
TZAT-0001FASTVT: 1
TZAT-0002ATACH: NEGATIVE
TZAT-0002ATACH: NEGATIVE
TZAT-0011SLOWVT: 10 ms
TZAT-0011SLOWVT: 10 ms
TZAT-0012ATACH: 150 ms
TZAT-0012FASTVT: 200 ms
TZAT-0012SLOWVT: 200 ms
TZAT-0012SLOWVT: 200 ms
TZAT-0013SLOWVT: 2
TZAT-0013SLOWVT: 2
TZAT-0018ATACH: NEGATIVE
TZAT-0018ATACH: NEGATIVE
TZAT-0018ATACH: NEGATIVE
TZAT-0018SLOWVT: NEGATIVE
TZAT-0018SLOWVT: NEGATIVE
TZAT-0019ATACH: 6 V
TZAT-0019SLOWVT: 8 V
TZAT-0019SLOWVT: 8 V
TZAT-0020ATACH: 1.5 ms
TZAT-0020SLOWVT: 1.5 ms
TZAT-0020SLOWVT: 1.5 ms
TZON-0003SLOWVT: 320 ms
TZON-0004VSLOWVT: 20
TZON-0005SLOWVT: 12
TZST-0001FASTVT: 2
TZST-0001FASTVT: 3
TZST-0001FASTVT: 4
TZST-0001SLOWVT: 4
TZST-0001SLOWVT: 5
TZST-0002ATACH: NEGATIVE
TZST-0002ATACH: NEGATIVE
TZST-0002FASTVT: NEGATIVE
TZST-0002FASTVT: NEGATIVE
TZST-0003SLOWVT: 25 J
TZST-0003SLOWVT: 35 J
VENTRICULAR PACING ICD: 0.2 pct

## 2011-05-21 ENCOUNTER — Ambulatory Visit: Payer: Medicare Other | Attending: Family Medicine

## 2011-05-21 DIAGNOSIS — M2550 Pain in unspecified joint: Secondary | ICD-10-CM | POA: Insufficient documentation

## 2011-05-21 DIAGNOSIS — M2560 Stiffness of unspecified joint, not elsewhere classified: Secondary | ICD-10-CM | POA: Insufficient documentation

## 2011-05-21 DIAGNOSIS — IMO0001 Reserved for inherently not codable concepts without codable children: Secondary | ICD-10-CM | POA: Insufficient documentation

## 2011-05-31 ENCOUNTER — Ambulatory Visit: Payer: Medicare Other

## 2011-06-02 ENCOUNTER — Telehealth: Payer: Self-pay | Admitting: Cardiology

## 2011-06-02 NOTE — Telephone Encounter (Signed)
Pt was taking Potassium , one by mouth every other day, when renewed dosage changed to taking one every day, wants to confirm change

## 2011-06-02 NOTE — Telephone Encounter (Signed)
Called pt and we have not changed any of her meds since visit 04/08/11. No phone contact since then either. She denies that her pcp has changed her. Told to contact her pharmacy and see why there was a change to her script. Pt agreed to do so.

## 2011-06-04 ENCOUNTER — Ambulatory Visit: Payer: Medicare Other

## 2011-06-07 ENCOUNTER — Ambulatory Visit: Payer: Medicare Other | Attending: Family Medicine

## 2011-06-07 DIAGNOSIS — M2550 Pain in unspecified joint: Secondary | ICD-10-CM | POA: Insufficient documentation

## 2011-06-07 DIAGNOSIS — M2560 Stiffness of unspecified joint, not elsewhere classified: Secondary | ICD-10-CM | POA: Insufficient documentation

## 2011-06-07 DIAGNOSIS — IMO0001 Reserved for inherently not codable concepts without codable children: Secondary | ICD-10-CM | POA: Insufficient documentation

## 2011-06-09 ENCOUNTER — Ambulatory Visit: Payer: Medicare Other

## 2011-06-14 ENCOUNTER — Encounter: Payer: Self-pay | Admitting: Cardiology

## 2011-06-14 ENCOUNTER — Ambulatory Visit (INDEPENDENT_AMBULATORY_CARE_PROVIDER_SITE_OTHER): Payer: Medicare Other | Admitting: Cardiology

## 2011-06-14 DIAGNOSIS — I25810 Atherosclerosis of coronary artery bypass graft(s) without angina pectoris: Secondary | ICD-10-CM

## 2011-06-14 DIAGNOSIS — I509 Heart failure, unspecified: Secondary | ICD-10-CM

## 2011-06-14 DIAGNOSIS — I5032 Chronic diastolic (congestive) heart failure: Secondary | ICD-10-CM

## 2011-06-14 DIAGNOSIS — E7800 Pure hypercholesterolemia, unspecified: Secondary | ICD-10-CM

## 2011-06-14 DIAGNOSIS — I5022 Chronic systolic (congestive) heart failure: Secondary | ICD-10-CM

## 2011-06-14 DIAGNOSIS — I6529 Occlusion and stenosis of unspecified carotid artery: Secondary | ICD-10-CM

## 2011-06-14 LAB — BASIC METABOLIC PANEL
CO2: 23 mEq/L (ref 19–32)
Chloride: 108 mEq/L (ref 96–112)
Potassium: 4.1 mEq/L (ref 3.5–5.1)
Sodium: 139 mEq/L (ref 135–145)

## 2011-06-14 LAB — LIPID PANEL
Cholesterol: 123 mg/dL (ref 0–200)
HDL: 48.6 mg/dL (ref >39.00)
LDL Cholesterol: 65 mg/dL (ref 0–99)
Total CHOL/HDL Ratio: 3
Triglycerides: 47 mg/dL (ref 0.0–149.0)
VLDL: 9.4 mg/dL (ref 0.0–40.0)

## 2011-06-14 MED ORDER — CARVEDILOL 12.5 MG PO TABS: 12.5000 mg | ORAL_TABLET | Freq: Two times a day (BID) | ORAL | Status: DC

## 2011-06-14 NOTE — Progress Notes (Signed)
Patient ID: Dorothy Li, female   DOB: Nov 02, 1945, 65 y.o.   MRN: [id number] PCP: Dr. Clelia Croft at [facility]  65 yo with history of cardiomyopathy s/p ICD, and CAD s/p CABG presents for followup.   Patient had a left heart cath in 4/11 which showed a 75% ostial left main stenosis. She had CABG x 2.  She has moderate to severe LV systolic dysfunction that I think is a mixed picture, due to both CAD and probably ETOH abuse.  Since her CABG, she has quit smoking and quit drinking ETOH. She had syncope and episodes of VT on monitor, so Medtronic ICD was placed in 7/11.  This summer, patient had an episode of profound dehydration with renal failure (house not air conditioned, poor po intake with use of Lasix).  This resolved with rehydration.   Patient has been doing reasonably well recently.  No lightheaded spells.  She occasionally has vertigo-type symptoms if she looks upwards but no orthostatic-type symptoms.  She denies dyspnea walking on flat ground or going up steps.  No chest pain.  Optivol was checked in 11/5 and was stable (improved from 10/12).  She is taking Lasix every other day now.  She is thinking about joining the [facility].  She is doing PT for her left shoulder (suspect rotator cuff injury).   Labs (8/11): BNP 181 Labs (9/11): K 4.4, creatinine 0.6 Labs (10/11): BNP 95 Labs (11/11): BNP 65, creatinine 0.8, K 4.7, LDL 86, HDL 55 Labs (12/11): K 4.5, creatinine 0.7 Labs (1/12): LDL 69, HDL 41 Labs (6/12): K 4.9, creatinine 0.97, LDL 82, HDL 45 Labs (7/12): creatinine 3.36 => 2.57 Labs (9/12): K 3.7, creatinine 0.9, BNP 65 Labs (10/12): K 4.4, creatinine 0.8  Allergies:  1)  ! Pcn  Past Medical History: 1. HTN 2. ETOH abuse: Elevated AST, mildly elevated INR (? component of cirrhosis). Has now quit ETOH.  3. History of ARF in setting of dehydration/ACEI in the summer of 2011 and again in 2012.  4. Hyperlipidemia 5. Nephrolithiasis 6. Cardiomyopathy: Echo (4/11) with EF 25%, diffuse  hypokinesis, moderate LV dilation, decreased RV systolic function but normal RV size, PA systolic pressure 35 mmHg. Lexiscan myoview (4/11): EF 29%, diffuse hypokinesis, basal anteroseptal and apical scar, no ischemia.  The cardiomyopathy is likely due to a combination of CAD (LM stenosis) and ETOH abuse.  ANA and SPEP were negative.  Echo (6/11): EF 25%, mild diastolic dysfunction, normal RV, no pulmonary HTN 7. VT: Dual chamber Medtronic ICD placed 7/11 8. CAD: LHC (4/11) showed 75% ostial LM stenosis with significant damping upon catheter engagement, 60-70% mLAD, 60% ostial D1, EF 30%.  Patient had CABG with LIMA-LAD and SVG-OM2.  9. carotid stenosis: carotid dopplers (4/11) with 60-79% LICA stenosis.  Carotid dopplers (10/11): 60-79% LICA stenosis.  Carotids (4/12): 60-79% LICA.  Carotids (11/12): 60-79% LICA.  10.  Syncope  Family History: Sister with heart surgery, she is not sure why.   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  No drug use and negative urine drug screen in 2/11  Review of Systems        All systems reviewed and negative except as per HPI.   Current Outpatient Prescriptions  Medication Sig Dispense Refill  . acetaminophen (TYLENOL) 325 MG tablet Use two tablets as needed for shoulder pain      . aspirin EC 81  MG tablet Take 1 tablet (81 mg total) by mouth daily.      . Calcium Carbonate-Vitamin D (CALCIUM 600-D) 600-400 MG-UNIT per tablet Take by mouth daily.       . enalapril (VASOTEC) 2.5 MG tablet Take 1 tablet (2.5 mg total) by mouth 2 (two) times daily.  60 tablet  6  . furosemide (LASIX) 20 MG tablet every other day. Take one every other day       . potassium chloride (KLOR-CON) 10 MEQ CR tablet every other day. Take one every other day       . rosuvastatin (CRESTOR) 40 MG tablet Take 1 tablet (40 mg total) by mouth daily.  30  tablet  6  . spironolactone (ALDACTONE) 25 MG tablet Take 1 tablet (25 mg total) by mouth daily.  30 tablet  6  . DISCONTD: carvedilol (COREG) 6.25 MG tablet Take 1 1/2 tablets twice daily      . carvedilol (COREG) 12.5 MG tablet Take 1 tablet (12.5 mg total) by mouth 2 (two) times daily.  60 tablet  6    BP 108/68  Pulse 91  Ht 5' 11.75" (1.822 m)  Wt 73.029 kg (161 lb)  BMI 21.99 kg/m2 General:  Well developed, well nourished, in no acute distress. Neck:  Neck supple, no JVD. No masses, thyromegaly or abnormal cervical nodes. Lungs:  Clear bilaterally to auscultation and percussion. Heart:  Non-displaced PMI, chest non-tender; regular rate and rhythm, S1, S2 without murmurs, rubs. +S4. Carotid upstroke normal, no bruit.  Pedals normal pulses. No edema, no varicosities. Abdomen:  Bowel sounds positive; abdomen soft and non-tender without masses, organomegaly, or hernias noted. No hepatosplenomegaly. Extremities:  No clubbing or cyanosis. Neurologic:  Alert and oriented x 3. Psych:  Normal affect.

## 2011-06-14 NOTE — Assessment & Plan Note (Signed)
Check lipids today, goal LDL < 70.  

## 2011-06-14 NOTE — Assessment & Plan Note (Signed)
Optivol impedance improved in 11/12 compared to 10/12.  She is taking Lasix every other day.  She is euvolemic on exam.   - Continue current Lasix, enalapril, and spironolactone.  - BMET today.  - Increase Coreg to 12.5 mg bid.  - Followup in 3 months - I encouraged her to join the [employer] program at [facility] for regular exercise.

## 2011-06-14 NOTE — Patient Instructions (Addendum)
Increase coreg(carvedilol) to 12.5mg  twice a day. You can take two 6.25mg  tablets twice a day.  Your physician recommends that you have  a FASTING lipid profile / BMET 414.05  428.32  Your physician recommends that you schedule a follow-up appointment in: 3 months with Dr Shirlee Latch.

## 2011-06-14 NOTE — Assessment & Plan Note (Signed)
Moderate LICA stenosis.  Repeat study in 5/12.

## 2011-06-14 NOTE — Assessment & Plan Note (Signed)
Stable with no ischemic symptoms.  Continue ASA 81, beta blocker, ACEI, statin.

## 2011-06-15 ENCOUNTER — Ambulatory Visit: Payer: Medicare Other | Admitting: Physical Therapy

## 2011-06-18 ENCOUNTER — Ambulatory Visit: Payer: Medicare Other

## 2011-06-22 ENCOUNTER — Ambulatory Visit: Payer: Medicare Other

## 2011-06-23 ENCOUNTER — Telehealth: Payer: Self-pay | Admitting: Cardiology

## 2011-06-23 NOTE — Telephone Encounter (Signed)
Pt found out she has arthritis in neck and left arm, what do you recommend that she can take, as far as anti inflammatory?

## 2011-06-23 NOTE — Telephone Encounter (Signed)
Will depend on what it is.  With her CHF and h/o renal failure, I would not be excited about her taking an NSAID.

## 2011-06-23 NOTE — Telephone Encounter (Signed)
I talked with pt. 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 Dr Clelia Croft is recommending pt take an anti-inflammatory medication and pt wants to know if this is OK with Dr Shirlee Latch. I will forward to Dr Shirlee Latch for review.

## 2011-06-24 ENCOUNTER — Encounter: Payer: Self-pay | Admitting: Internal Medicine

## 2011-06-24 ENCOUNTER — Ambulatory Visit: Payer: Medicare Other

## 2011-06-24 NOTE — Telephone Encounter (Signed)
I talked with pt. Pt states Dr Clelia Croft suggested ibuprofen. Dr Shirlee Latch said occ ibuprofen is OK but not regular use. Pt states she has been going to rehab and is learning stretching exercises that are helping. Pt will try not to use NSAIDs at all.

## 2011-06-28 ENCOUNTER — Ambulatory Visit: Payer: Medicare Other

## 2011-06-30 ENCOUNTER — Ambulatory Visit: Payer: Medicare Other

## 2011-07-07 ENCOUNTER — Ambulatory Visit: Payer: Medicare Other | Attending: Family Medicine

## 2011-07-07 DIAGNOSIS — IMO0001 Reserved for inherently not codable concepts without codable children: Secondary | ICD-10-CM | POA: Insufficient documentation

## 2011-07-07 DIAGNOSIS — M2560 Stiffness of unspecified joint, not elsewhere classified: Secondary | ICD-10-CM | POA: Insufficient documentation

## 2011-07-07 DIAGNOSIS — M2550 Pain in unspecified joint: Secondary | ICD-10-CM | POA: Insufficient documentation

## 2011-07-14 ENCOUNTER — Ambulatory Visit: Payer: Medicare Other | Admitting: Physical Therapy

## 2011-07-16 ENCOUNTER — Ambulatory Visit: Payer: Medicare Other

## 2011-07-19 ENCOUNTER — Ambulatory Visit: Payer: Medicare Other

## 2011-07-20 ENCOUNTER — Encounter: Payer: Self-pay | Admitting: Cardiovascular Disease

## 2011-07-21 ENCOUNTER — Ambulatory Visit: Payer: Medicare Other

## 2011-08-06 ENCOUNTER — Other Ambulatory Visit (HOSPITAL_COMMUNITY): Payer: Self-pay | Admitting: Family Medicine

## 2011-08-06 DIAGNOSIS — Z0000 Encounter for general adult medical examination without abnormal findings: Secondary | ICD-10-CM

## 2011-08-10 ENCOUNTER — Ambulatory Visit (HOSPITAL_COMMUNITY)
Admission: RE | Admit: 2011-08-10 | Discharge: 2011-08-10 | Disposition: A | Discharge status: Home / Self Care | Payer: Medicare Other | Source: Ambulatory Visit | Attending: Family Medicine | Admitting: Family Medicine

## 2011-08-10 DIAGNOSIS — Z0000 Encounter for general adult medical examination without abnormal findings: Secondary | ICD-10-CM

## 2011-08-10 DIAGNOSIS — Z13820 Encounter for screening for osteoporosis: Secondary | ICD-10-CM | POA: Insufficient documentation

## 2011-08-12 ENCOUNTER — Encounter: Payer: Self-pay | Admitting: Internal Medicine

## 2011-08-12 ENCOUNTER — Ambulatory Visit (INDEPENDENT_AMBULATORY_CARE_PROVIDER_SITE_OTHER): Payer: Medicare Other | Admitting: *Deleted

## 2011-08-12 ENCOUNTER — Encounter: Payer: Medicare Other | Admitting: *Deleted

## 2011-08-12 DIAGNOSIS — I5022 Chronic systolic (congestive) heart failure: Secondary | ICD-10-CM

## 2011-08-12 DIAGNOSIS — I472 Ventricular tachycardia: Secondary | ICD-10-CM

## 2011-08-12 DIAGNOSIS — I428 Other cardiomyopathies: Secondary | ICD-10-CM

## 2011-08-12 LAB — ICD DEVICE OBSERVATION
AL IMPEDENCE ICD: 475 Ohm
AL THRESHOLD: 0.375 V
BATTERY VOLTAGE: 3.1589 V
CHARGE TIME: 9.289 s
RV LEAD AMPLITUDE: 4.875 mv
TOT-0001: 1
TOT-0002: 0
TOT-0006: 20110701000000
TZAT-0001ATACH: 1
TZAT-0001ATACH: 3
TZAT-0001FASTVT: 1
TZAT-0001SLOWVT: 1
TZAT-0001SLOWVT: 2
TZAT-0002ATACH: NEGATIVE
TZAT-0002FASTVT: NEGATIVE
TZAT-0004SLOWVT: 8
TZAT-0012ATACH: 150 ms
TZAT-0012ATACH: 150 ms
TZAT-0012FASTVT: 200 ms
TZAT-0012SLOWVT: 200 ms
TZAT-0012SLOWVT: 200 ms
TZAT-0013SLOWVT: 2
TZAT-0013SLOWVT: 2
TZAT-0018ATACH: NEGATIVE
TZAT-0018ATACH: NEGATIVE
TZAT-0018FASTVT: NEGATIVE
TZAT-0018SLOWVT: NEGATIVE
TZAT-0018SLOWVT: NEGATIVE
TZAT-0019SLOWVT: 8 V
TZAT-0019SLOWVT: 8 V
TZAT-0020ATACH: 1.5 ms
TZAT-0020ATACH: 1.5 ms
TZAT-0020SLOWVT: 1.5 ms
TZAT-0020SLOWVT: 1.5 ms
TZON-0003ATACH: 350 ms
TZON-0003FASTVT: 240 ms
TZON-0003SLOWVT: 320 ms
TZON-0003VSLOWVT: 450 ms
TZON-0004SLOWVT: 24
TZON-0005SLOWVT: 12
TZST-0001ATACH: 4
TZST-0001ATACH: 6
TZST-0001FASTVT: 2
TZST-0001FASTVT: 4
TZST-0001FASTVT: 6
TZST-0001SLOWVT: 3
TZST-0001SLOWVT: 5
TZST-0002ATACH: NEGATIVE
TZST-0002ATACH: NEGATIVE
TZST-0002FASTVT: NEGATIVE
TZST-0002FASTVT: NEGATIVE
TZST-0002FASTVT: NEGATIVE
TZST-0003SLOWVT: 35 J
TZST-0003SLOWVT: 35 J

## 2011-08-12 NOTE — Progress Notes (Signed)
ICD check with ICM 

## 2011-08-17 ENCOUNTER — Encounter: Payer: Self-pay | Admitting: Cardiology

## 2011-09-17 ENCOUNTER — Encounter: Payer: Self-pay | Admitting: Internal Medicine

## 2011-09-27 ENCOUNTER — Encounter: Payer: Self-pay | Admitting: Cardiology

## 2011-09-27 ENCOUNTER — Ambulatory Visit (INDEPENDENT_AMBULATORY_CARE_PROVIDER_SITE_OTHER): Payer: Medicare Other | Admitting: Cardiology

## 2011-09-27 ENCOUNTER — Encounter: Payer: Self-pay | Admitting: Internal Medicine

## 2011-09-27 VITALS — BP 132/78 | HR 100 | Ht 69.0 in | Wt 172.0 lb

## 2011-09-27 DIAGNOSIS — I6529 Occlusion and stenosis of unspecified carotid artery: Secondary | ICD-10-CM

## 2011-09-27 DIAGNOSIS — I25810 Atherosclerosis of coronary artery bypass graft(s) without angina pectoris: Secondary | ICD-10-CM

## 2011-09-27 DIAGNOSIS — E7800 Pure hypercholesterolemia, unspecified: Secondary | ICD-10-CM

## 2011-09-27 DIAGNOSIS — I5022 Chronic systolic (congestive) heart failure: Secondary | ICD-10-CM

## 2011-09-27 MED ORDER — HYDRALAZINE HCL 25 MG PO TABS: 25.0000 mg | ORAL_TABLET | Freq: Three times a day (TID) | ORAL | Status: DC

## 2011-09-27 MED ORDER — ISOSORBIDE MONONITRATE ER 30 MG PO TB24: 30.0000 mg | ORAL_TABLET | Freq: Every day | ORAL | Status: DC

## 2011-09-27 MED ORDER — ISOSORB DINITRATE-HYDRALAZINE 20-37.5 MG PO TABS: ORAL_TABLET | ORAL | Status: DC

## 2011-09-27 NOTE — Assessment & Plan Note (Signed)
Stable with no ischemic symptoms.  Continue ASA 81, beta blocker, ACEI, statin.  

## 2011-09-27 NOTE — Patient Instructions (Addendum)
Start Imdur(isosorbide mononitrate) 30mg  daily.  Start Hydralazine 25mg  three times a day.  Your physician has requested that you have a carotid duplex. This test is an ultrasound of the carotid arteries in your neck. It looks at blood flow through these arteries that supply the brain with blood. Allow one hour for this exam. There are no restrictions or special instructions. May 2013  Your physician recommends that you return for lab work in: May 2013--BMET -you can have this the same day you have the carotid duplex.  Your physician recommends that you schedule a follow-up appointment in: 3 months with Dr Shirlee Latch.

## 2011-09-27 NOTE — Progress Notes (Signed)
Patient ID: Dorothy Li, female   DOB: 31-Dec-1945, 66 y.o.   MRN: [id number] PCP: Dr. Clelia Croft at [facility]  66 yo with history of cardiomyopathy s/p ICD, and CAD s/p CABG presents for followup.   Patient had a left heart cath in 4/11 which showed a 75% ostial left main stenosis. She had CABG x 2.  She has moderate to severe LV systolic dysfunction that I think is a mixed picture, due to both CAD and probably ETOH abuse.  Since her CABG, she has quit smoking and quit drinking ETOH. She had syncope and episodes of VT on monitor, so Medtronic ICD was placed in 7/11.  Last summer, patient had an episode of profound dehydration with renal failure (house not air conditioned, poor po intake with use of Lasix).  This resolved with rehydration.   She has gained 11 lbs since I last saw her.  She does not feel like she has retained fluid.  She says she has been eating a fair amount of ice cream.  She has joined [employer] program at [employer] and is walking 2 miles/day (for the last 2 weeks).  No exertional dyspnea.  No chest pain.  No problems with orthostatic symptoms recently.    Optivol was checked today and showed thoracic impedance trending up and fluid index below threshold.  Labs (8/11): BNP 181 Labs (9/11): K 4.4, creatinine 0.6 Labs (10/11): BNP 95 Labs (11/11): BNP 65, creatinine 0.8, K 4.7, LDL 86, HDL 55 Labs (12/11): K 4.5, creatinine 0.7 Labs (1/12): LDL 69, HDL 41 Labs (6/12): K 4.9, creatinine 0.97, LDL 82, HDL 45 Labs (7/12): creatinine 3.36 => 2.57 Labs (9/12): K 3.7, creatinine 0.9, BNP 65 Labs (10/12): K 4.4, creatinine 0.8 Labs (2/13): K 4.3, creatinine 0.82, LDL 77, HDL 45  Allergies:  1)  ! Pcn  Past Medical History: 1. HTN 2. ETOH abuse: Elevated AST, mildly elevated INR (? component of cirrhosis). Has now quit ETOH.  3. History of ARF in setting of dehydration/ACEI in the summer of 2011 and again in 2012.  4. Hyperlipidemia 5. Nephrolithiasis 6. Cardiomyopathy: Echo  (4/11) with EF 25%, diffuse hypokinesis, moderate LV dilation, decreased RV systolic function but normal RV size, PA systolic pressure 35 mmHg. Lexiscan myoview (4/11): EF 29%, diffuse hypokinesis, basal anteroseptal and apical scar, no ischemia.  The cardiomyopathy is likely due to a combination of CAD (LM stenosis) and ETOH abuse.  ANA and SPEP were negative.  Echo (6/11): EF 25%, mild diastolic dysfunction, normal RV, no pulmonary HTN 7. VT: Dual chamber Medtronic ICD placed 7/11 8. CAD: LHC (4/11) showed 75% ostial LM stenosis with significant damping upon catheter engagement, 60-70% mLAD, 60% ostial D1, EF 30%.  Patient had CABG with LIMA-LAD and SVG-OM2.  9. carotid stenosis: carotid dopplers (4/11) with 60-79% LICA stenosis.  Carotid dopplers (10/11): 60-79% LICA stenosis.  Carotids (4/12): 60-79% LICA.  Carotids (11/12): 60-79% LICA.  10.  Syncope  Family History: Sister with heart surgery, she is not sure why.   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 Married, lives in [place] Quit smoking 4/11 Prior moderate ETOH use (2 liquor drinks daily) but quit in 3/11.  No drug use and negative urine drug screen in 2/11  Review of Systems        All systems reviewed and negative except as per HPI.   Current Outpatient Prescriptions  Medication Sig Dispense Refill  . acetaminophen (TYLENOL) 325 MG tablet Use two tablets as needed  for shoulder pain      . aspirin EC 81 MG tablet Take 1 tablet (81 mg total) by mouth daily.      . Calcium Carbonate-Vitamin D (CALCIUM 600-D) 600-400 MG-UNIT per tablet Take by mouth daily.       . carvedilol (COREG) 12.5 MG tablet Take 1 tablet (12.5 mg total) by mouth 2 (two) times daily.  60 tablet  6  . enalapril (VASOTEC) 2.5 MG tablet Take 1 tablet (2.5 mg total) by mouth 2 (two) times daily.  60 tablet  6  . furosemide (LASIX) 20 MG tablet every other day. Take one every other day       . potassium chloride (KLOR-CON) 10 MEQ CR tablet  every other day. Take one every other day       . rosuvastatin (CRESTOR) 40 MG tablet Take 1 tablet (40 mg total) by mouth daily.  30 tablet  6  . spironolactone (ALDACTONE) 25 MG tablet Take 1 tablet (25 mg total) by mouth daily.  30 tablet  6  . hydrALAZINE (APRESOLINE) 25 MG tablet Take 1 tablet (25 mg total) by mouth 3 (three) times daily.  90 tablet  6  . isosorbide mononitrate (IMDUR) 30 MG 24 hr tablet Take 1 tablet (30 mg total) by mouth daily.  30 tablet  6    BP 132/78  Pulse 100  Ht 5\' 9"  (1.753 m)  Wt 172 lb (78.019 kg)  BMI 25.40 kg/m2 General:  Well developed, well nourished, in no acute distress. Neck:  Neck supple, no JVD. No masses, thyromegaly or abnormal cervical nodes. Lungs:  Clear bilaterally to auscultation and percussion. Heart:  Non-displaced PMI, chest non-tender; regular rate and rhythm, S1, S2 without murmurs, rubs. +S4. Carotid upstroke normal, no bruit.  Pedals normal pulses. No edema, no varicosities. Abdomen:  Bowel sounds positive; abdomen soft and non-tender without masses, organomegaly, or hernias noted. No hepatosplenomegaly. Extremities:  No clubbing or cyanosis. Neurologic:  Alert and oriented x 3. Psych:  Normal affect.

## 2011-09-27 NOTE — Assessment & Plan Note (Signed)
Moderate LICA stenosis, needs repeat carotid dopplers in 5/13.

## 2011-09-27 NOTE — Assessment & Plan Note (Signed)
LDL close to goal (< 70) in 2/13.

## 2011-09-27 NOTE — Assessment & Plan Note (Signed)
Optivol impedance looks ok today and she is not volume overloaded on exam.  NYHA class II symptoms.  I think her weight gain is due to diet and not fluid retention.  She is taking Lasix every other day.   - Continue current Lasix, enalapril, Coreg, and spironolactone.  I will not increase enalapril further at this time, has had ARF in the past with ACEI increase (in association with dehydration).  - Start hydralazine 25 mg tid + Imdur 30 mg daily rather than increasing ACEI.   - Continue exercising at [facility].

## 2011-11-01 ENCOUNTER — Other Ambulatory Visit (HOSPITAL_COMMUNITY): Payer: Self-pay | Admitting: Family Medicine

## 2011-11-01 DIAGNOSIS — Z1231 Encounter for screening mammogram for malignant neoplasm of breast: Secondary | ICD-10-CM

## 2011-11-05 ENCOUNTER — Other Ambulatory Visit: Payer: Self-pay | Admitting: Cardiology

## 2011-11-05 DIAGNOSIS — I5022 Chronic systolic (congestive) heart failure: Secondary | ICD-10-CM

## 2011-11-05 DIAGNOSIS — I10 Essential (primary) hypertension: Secondary | ICD-10-CM

## 2011-11-05 MED ORDER — SPIRONOLACTONE 25 MG PO TABS: 25.0000 mg | ORAL_TABLET | Freq: Every day | ORAL | Status: DC

## 2011-11-05 MED ORDER — ENALAPRIL MALEATE 2.5 MG PO TABS: 2.5000 mg | ORAL_TABLET | Freq: Two times a day (BID) | ORAL | Status: DC

## 2011-11-05 NOTE — Telephone Encounter (Signed)
Pt out of the spironolactone, needs refill called in today, [facility]

## 2011-11-11 ENCOUNTER — Other Ambulatory Visit (INDEPENDENT_AMBULATORY_CARE_PROVIDER_SITE_OTHER): Payer: Medicare Other

## 2011-11-11 ENCOUNTER — Encounter (INDEPENDENT_AMBULATORY_CARE_PROVIDER_SITE_OTHER): Payer: Medicare Other

## 2011-11-11 DIAGNOSIS — R42 Dizziness and giddiness: Secondary | ICD-10-CM

## 2011-11-11 DIAGNOSIS — I5022 Chronic systolic (congestive) heart failure: Secondary | ICD-10-CM

## 2011-11-11 DIAGNOSIS — I6529 Occlusion and stenosis of unspecified carotid artery: Secondary | ICD-10-CM

## 2011-11-11 LAB — BASIC METABOLIC PANEL
BUN: 25 mg/dL — ABNORMAL HIGH (ref 6–23)
Creatinine, Ser: 1 mg/dL (ref 0.4–1.2)
GFR: 71.44 mL/min (ref >60.00)
Glucose, Bld: 92 mg/dL (ref 70–99)
Potassium: 4.1 mEq/L (ref 3.5–5.1)

## 2011-11-16 ENCOUNTER — Telehealth: Payer: Self-pay | Admitting: Cardiology

## 2011-11-17 NOTE — Telephone Encounter (Signed)
Close  

## 2011-11-18 ENCOUNTER — Ambulatory Visit (INDEPENDENT_AMBULATORY_CARE_PROVIDER_SITE_OTHER): Payer: Medicare Other | Admitting: Internal Medicine

## 2011-11-18 ENCOUNTER — Encounter: Payer: Self-pay | Admitting: Internal Medicine

## 2011-11-18 VITALS — BP 118/74 | HR 78 | Ht 71.0 in | Wt 168.0 lb

## 2011-11-18 DIAGNOSIS — I472 Ventricular tachycardia, unspecified: Secondary | ICD-10-CM

## 2011-11-18 DIAGNOSIS — I428 Other cardiomyopathies: Secondary | ICD-10-CM

## 2011-11-18 DIAGNOSIS — I5022 Chronic systolic (congestive) heart failure: Secondary | ICD-10-CM

## 2011-11-18 DIAGNOSIS — Z95810 Presence of automatic (implantable) cardiac defibrillator: Secondary | ICD-10-CM

## 2011-11-18 LAB — ICD DEVICE OBSERVATION
AL AMPLITUDE: 3.8 mv
AL IMPEDENCE ICD: 513 Ohm
AL THRESHOLD: 0.375 V
ATRIAL PACING ICD: 0.15 pct
BATTERY VOLTAGE: 3.1568 V
CHARGE TIME: 9.289 s
DEV-0020ICD: NEGATIVE
RV LEAD IMPEDENCE ICD: 361 Ohm
TOT-0002: 0
TOT-0006: 20110701000000
TZAT-0001ATACH: 1
TZAT-0001ATACH: 3
TZAT-0001FASTVT: 1
TZAT-0001SLOWVT: 1
TZAT-0001SLOWVT: 2
TZAT-0002ATACH: NEGATIVE
TZAT-0002ATACH: NEGATIVE
TZAT-0002FASTVT: NEGATIVE
TZAT-0005SLOWVT: 88 pct
TZAT-0005SLOWVT: 91 pct
TZAT-0012ATACH: 150 ms
TZAT-0013SLOWVT: 2
TZAT-0013SLOWVT: 2
TZAT-0018ATACH: NEGATIVE
TZAT-0018ATACH: NEGATIVE
TZAT-0018SLOWVT: NEGATIVE
TZAT-0018SLOWVT: NEGATIVE
TZAT-0019ATACH: 6 V
TZAT-0020ATACH: 1.5 ms
TZAT-0020SLOWVT: 1.5 ms
TZON-0003FASTVT: 240 ms
TZON-0004SLOWVT: 24
TZON-0004VSLOWVT: 20
TZON-0005SLOWVT: 12
TZST-0001ATACH: 5
TZST-0001ATACH: 6
TZST-0001FASTVT: 3
TZST-0001FASTVT: 4
TZST-0001SLOWVT: 3
TZST-0001SLOWVT: 6
TZST-0002ATACH: NEGATIVE
TZST-0002ATACH: NEGATIVE
TZST-0002FASTVT: NEGATIVE
TZST-0002FASTVT: NEGATIVE
TZST-0003SLOWVT: 35 J
TZST-0003SLOWVT: 35 J
VENTRICULAR PACING ICD: 0.01 pct

## 2011-11-18 NOTE — Assessment & Plan Note (Signed)
Stable on Guideline directed medical therapy

## 2011-11-18 NOTE — Assessment & Plan Note (Signed)
The patient's device was interrogated.  The information was reviewed. No changes were made in the programming.    

## 2011-11-18 NOTE — Progress Notes (Signed)
HPI  Dorothy Li is a 66 y.o. female  seen in followup for an ICD implanted for ventricular tachycardia with syncope. She has a history of coronary disease with prior bypass surgery and left ventricular dysfunction felt to be somewhat out of proportion to her coronary disease   The patient denies chest pain, shortness of breath, nocturnal dyspnea, orthopnea or peripheral edema.  There have been no palpitations, lightheadedness or syncope.    Past Medical History  Diagnosis Date  . HTN (hypertension)   . ETOH abuse      Elevated AST, mildly elevated INR (? component of cirrhosis). Has now quit ETOH.   [name] Hyperlipemia   . Nephrolithiasis   . Cardiomyopathy     Echo (4/11) with EF 25%, diffuse hypokinesis, moderate LV dilation, decreased RV  systolic function but normal RV size, PA systolic pressure 35 mmHg. Lexiscan myoview (4/11): EF 29%,  diffuse hypokinesis, basal anteroseptal and apical scar, no ischemia.  The cardiomyopathy is likely due to a combination of CAD (LM stenosis) and ETOH abuse.   . VT (ventricular tachycardia)     Dual chamber Medtronic ICD placed 7/11  . CAD (coronary artery disease)     LHC (4/11) showed 75% ostial LM stenosis with significant damping upon catheter engagement, 60-70% mLAD, 60% ostial D1, EF 30%.  Patient had CABG with LIMA-LAD and SVG-OM2.   . Carotid stenosis     carotid dopplers (4/11) with 60-79% LICA stenosis.  Carotid dopplers (10/11): 60-79%  LICA stenosis.  . Syncope   . ARF (acute renal failure)     in setting dehydration with ACEI use     Past Surgical History  Procedure Date  . Icd implantation     ICD-Medtronic    Remote - No    Current Outpatient Prescriptions  Medication Sig Dispense Refill  . acetaminophen (TYLENOL) 325 MG tablet Use two tablets as needed for shoulder pain      . aspirin EC 81 MG tablet Take 1 tablet (81 mg total) by mouth daily.      . Calcium Carbonate-Vitamin D (CALCIUM 600-D) 600-400 MG-UNIT per tablet Take  by mouth daily.       . carvedilol (COREG) 12.5 MG tablet Take 1 tablet (12.5 mg total) by mouth 2 (two) times daily.  60 tablet  6  . enalapril (VASOTEC) 2.5 MG tablet Take 1 tablet (2.5 mg total) by mouth 2 (two) times daily.  60 tablet  6  . furosemide (LASIX) 20 MG tablet every other day. Take one every other day       . hydrALAZINE (APRESOLINE) 25 MG tablet Take 1 tablet (25 mg total) by mouth 3 (three) times daily.  90 tablet  6  . isosorbide mononitrate (IMDUR) 30 MG 24 hr tablet Take 1 tablet (30 mg total) by mouth daily.  30 tablet  6  . potassium chloride (KLOR-CON) 10 MEQ CR tablet every other day. Take one every other day       . rosuvastatin (CRESTOR) 40 MG tablet Take 1 tablet (40 mg total) by mouth daily.  30 tablet  6  . spironolactone (ALDACTONE) 25 MG tablet Take 1 tablet (25 mg total) by mouth daily.  30 tablet  6    Allergies  Allergen Reactions  . Penicillins     Review of Systems negative except from HPI and PMH  Physical Exam BP 118/74  Pulse 78  Ht 5\' 11"  (1.803 m)  Wt 168 lb (76.204 kg)  BMI  23.43 kg/m2 Well developed and well nourished in no acute distress HENT normal E scleral and icterus clear Neck Supple JVP flat; carotids brisk and full Clear to ausculation Regular rate and rhythm, no murmurs gallops or rub Soft with active bowel sounds No clubbing cyanosis none Edema Alert and oriented, grossly normal motor and sensory function Skin Warm and Dry    Assessment and  Plan

## 2011-11-18 NOTE — Patient Instructions (Signed)
Your physician recommends that you schedule a follow-up appointment in: 3 months with Kristin/Paula in device clinic.  Your physician wants you to follow-up in: 1 year with Dr. Klein. You will receive a reminder letter in the mail two months in advance. If you don't receive a letter, please call our office to schedule the follow-up appointment.  Your physician recommends that you continue on your current medications as directed. Please refer to the Current Medication list given to you today.  

## 2011-11-18 NOTE — Assessment & Plan Note (Signed)
Continue current eds

## 2011-11-18 NOTE — Assessment & Plan Note (Signed)
No intercurrent Ventricular tachycardia  

## 2011-11-24 ENCOUNTER — Ambulatory Visit (HOSPITAL_COMMUNITY)
Admission: RE | Admit: 2011-11-24 | Discharge: 2011-11-24 | Disposition: A | Discharge status: Home / Self Care | Payer: Medicare Other | Source: Ambulatory Visit | Attending: Family Medicine | Admitting: Family Medicine

## 2011-11-24 DIAGNOSIS — Z1231 Encounter for screening mammogram for malignant neoplasm of breast: Secondary | ICD-10-CM | POA: Insufficient documentation

## 2011-11-26 ENCOUNTER — Other Ambulatory Visit: Payer: Self-pay | Admitting: Family Medicine

## 2011-11-26 DIAGNOSIS — R928 Other abnormal and inconclusive findings on diagnostic imaging of breast: Secondary | ICD-10-CM

## 2011-12-08 ENCOUNTER — Ambulatory Visit
Admission: RE | Admit: 2011-12-08 | Discharge: 2011-12-08 | Disposition: A | Discharge status: Home / Self Care | Payer: Medicare Other | Source: Ambulatory Visit | Attending: Family Medicine | Admitting: Family Medicine

## 2011-12-08 ENCOUNTER — Other Ambulatory Visit: Payer: Self-pay | Admitting: Family Medicine

## 2011-12-08 DIAGNOSIS — R928 Other abnormal and inconclusive findings on diagnostic imaging of breast: Secondary | ICD-10-CM

## 2011-12-08 DIAGNOSIS — C50919 Malignant neoplasm of unspecified site of unspecified female breast: Secondary | ICD-10-CM

## 2011-12-08 DIAGNOSIS — C50911 Malignant neoplasm of unspecified site of right female breast: Secondary | ICD-10-CM | POA: Insufficient documentation

## 2011-12-08 HISTORY — PX: BREAST BIOPSY: SHX20

## 2011-12-08 HISTORY — DX: Malignant neoplasm of unspecified site of unspecified female breast: C50.919

## 2011-12-13 ENCOUNTER — Encounter (INDEPENDENT_AMBULATORY_CARE_PROVIDER_SITE_OTHER): Payer: Self-pay | Admitting: General Surgery

## 2011-12-13 ENCOUNTER — Ambulatory Visit (INDEPENDENT_AMBULATORY_CARE_PROVIDER_SITE_OTHER): Payer: Medicare Other | Admitting: Surgery

## 2011-12-13 ENCOUNTER — Encounter (INDEPENDENT_AMBULATORY_CARE_PROVIDER_SITE_OTHER): Payer: Self-pay | Admitting: Surgery

## 2011-12-13 VITALS — BP 102/60 | HR 86 | Temp 97.4°F | Resp 16 | Ht 68.5 in | Wt 173.0 lb

## 2011-12-13 DIAGNOSIS — C50919 Malignant neoplasm of unspecified site of unspecified female breast: Secondary | ICD-10-CM

## 2011-12-13 DIAGNOSIS — C50911 Malignant neoplasm of unspecified site of right female breast: Secondary | ICD-10-CM

## 2011-12-13 NOTE — Patient Instructions (Signed)
We will check with your cardiologist about surgery and see if we can have you see a radiation therapy doctor to discuss alternatives for radiation

## 2011-12-13 NOTE — Progress Notes (Signed)
Patient ID: Dorothy Li, female   DOB: 1945-07-07, 66 y.o.   MRN: [id number]  Chief Complaint  Patient presents with  . Breast Cancer    new breast cancer- eval rt breast    HPI Dorothy Li is a 66 y.o. female.  She presents with a newly diagnosed right breast cancer found as an abnormality on routine mammogram. She was asymptomatic, no prior breast problems or symptoms, FH + for some cousins with breast cancer. HPI  Past Medical History  Diagnosis Date  . HTN (hypertension)   . ETOH abuse      Elevated AST, mildly elevated INR (? component of cirrhosis). Has now quit ETOH.   [name] Hyperlipemia   . Nephrolithiasis   . Cardiomyopathy     Echo (4/11) with EF 25%, diffuse hypokinesis, moderate LV dilation, decreased RV  systolic function but normal RV size, PA systolic pressure 35 mmHg. Lexiscan myoview (4/11): EF 29%,  diffuse hypokinesis, basal anteroseptal and apical scar, no ischemia.  The cardiomyopathy is likely due to a combination of CAD (LM stenosis) and ETOH abuse.   . VT (ventricular tachycardia)     Dual chamber Medtronic ICD placed 7/11  . CAD (coronary artery disease)     LHC (4/11) showed 75% ostial LM stenosis with significant damping upon catheter engagement, 60-70% mLAD, 60% ostial D1, EF 30%.  Patient had CABG with LIMA-LAD and SVG-OM2.   . Carotid stenosis     carotid dopplers (4/11) with 60-79% LICA stenosis.  Carotid dopplers (10/11): 60-79%  LICA stenosis.  . Syncope   . ARF (acute renal failure)     in setting dehydration with ACEI use   . Cancer     right breast    Past Surgical History  Procedure Date  . Icd implantation     ICD-Medtronic    Remote - No  . Coronary artery bypass graft   . Abdominal hysterectomy     Family History  Problem Relation Age of Onset  . Heart disease Sister     Social History History  Substance Use Topics  . Smoking status: Former Smoker -- 1.0 packs/day for 40 years    Types: Cigarettes    Quit date: 10/08/2009  .  Smokeless tobacco: Not on file  . Alcohol Use: No    Allergies  Allergen Reactions  . Penicillins     Current Outpatient Prescriptions  Medication Sig Dispense Refill  . acetaminophen (TYLENOL) 325 MG tablet Use two tablets as needed for shoulder pain      . aspirin EC 81 MG tablet Take 1 tablet (81 mg total) by mouth daily.      . Calcium Carbonate-Vitamin D (CALCIUM 600-D) 600-400 MG-UNIT per tablet Take by mouth daily.       . carvedilol (COREG) 12.5 MG tablet Take 1 tablet (12.5 mg total) by mouth 2 (two) times daily.  60 tablet  6  . enalapril (VASOTEC) 2.5 MG tablet Take 1 tablet (2.5 mg total) by mouth 2 (two) times daily.  60 tablet  6  . furosemide (LASIX) 20 MG tablet every other day. Take one every other day       . hydrALAZINE (APRESOLINE) 25 MG tablet Take 1 tablet (25 mg total) by mouth 3 (three) times daily.  90 tablet  6  . isosorbide mononitrate (IMDUR) 30 MG 24 hr tablet Take 1 tablet (30 mg total) by mouth daily.  30 tablet  6  . potassium chloride (KLOR-CON) 10 MEQ CR  tablet every other day. Take one every other day       . rosuvastatin (CRESTOR) 40 MG tablet Take 1 tablet (40 mg total) by mouth daily.  30 tablet  6  . spironolactone (ALDACTONE) 25 MG tablet Take 1 tablet (25 mg total) by mouth daily.  30 tablet  6    Review of Systems Review of Systems  Respiratory: Positive for cough.   Skin: Positive for rash.       Rash at site of biopsy from tape irritation    Blood pressure 102/60, pulse 86, temperature 97.4 F (36.3 C), temperature source Temporal, resp. rate 16, height 5' 8.5" (1.74 m), weight 173 lb (78.472 kg).  Physical Exam Physical Exam  Vitals reviewed. Constitutional: She is oriented to person, place, and time. She appears well-developed and well-nourished. No distress.  HENT:  Head: Normocephalic and atraumatic.  Mouth/Throat: Oropharynx is clear and moist.  Eyes: Conjunctivae and EOM are normal. Pupils are equal, round, and reactive to  light. No scleral icterus.  Neck: Normal range of motion. Neck supple. No tracheal deviation present. No thyromegaly present.  Cardiovascular: Normal rate, regular rhythm, normal heart sounds and intact distal pulses.  Exam reveals no gallop and no friction rub.   No murmur heard. Pulmonary/Chest: Effort normal and breath sounds normal. No respiratory distress. She has no wheezes. She has no rales.         Scars from CABG and defibrillator. No breast mass or other abnormality on either side  Abdominal: Soft. Bowel sounds are normal. She exhibits no distension and no mass. There is no tenderness. There is no rebound and no guarding.  Musculoskeletal: Normal range of motion. She exhibits no edema and no tenderness.  Lymphadenopathy:    She has no axillary adenopathy.  Neurological: She is alert and oriented to person, place, and time.  Skin: Skin is warm and dry. No rash noted. She is not diaphoretic. No erythema.  Psychiatric: She has a normal mood and affect. Her behavior is normal. Judgment and thought content normal.    Data Reviewed Mammogram and sono reports and path report  Assessment    Clinical Stahe I right breast cancer, UOQ, prognostic panel pending CAD, S/P bypass Defibrillator Systolic Heart failure    Plan    Not a candidate for MRI due to defibrillator Needs lumpectomy and radiation. Needs cards clearance Likely will be presented at this weeks breast conference Might be a mammosite candidate I have explained the pathophysiology and staging of breast cancer with particular attention to her exact situation. We discussed the multidisciplinary approach to breast cancer which often includes both medical and radiation oncology consultations.  We also discussed surgical options for the treatment of breast cancer including lumpectomy and mastectomy with possible reconstructive surgery. In addition we talked about the evaluation and management of lymph nodes including a  description of sentinel lymph node biopsy and axillary dissections. We reviewed potential complications and risks including bleeding, infection, numbness,  lymphedema, and the potential need for additional surgery.  She understands that for patients who are candidate for lumpectomy or mastectomy there is an equal survival rate with either technique, but a slightly higher local recurrence rate with lumpectomy. In addition she knows that a lumpectomy usually requires postoperative radiation as part of the management of the breast cancer.  We have discussed the likely postoperative course and plans for followup.  I have given the patient some written information that reviewed all of these issues. I believe her  questions are answered and that she has a good understanding of the issues.        Adante Courington J 12/13/2011, 9:07 AM

## 2011-12-14 ENCOUNTER — Encounter: Payer: Self-pay | Admitting: Radiation Oncology

## 2011-12-15 ENCOUNTER — Ambulatory Visit
Admission: RE | Admit: 2011-12-15 | Discharge: 2011-12-15 | Disposition: A | Discharge status: Home / Self Care | Payer: Medicare Other | Source: Ambulatory Visit | Attending: Radiation Oncology | Admitting: Radiation Oncology

## 2011-12-15 ENCOUNTER — Telehealth: Payer: Self-pay | Admitting: *Deleted

## 2011-12-15 ENCOUNTER — Encounter: Payer: Self-pay | Admitting: Radiation Oncology

## 2011-12-15 VITALS — BP 81/57 | HR 103 | Temp 99.2°F | Wt 170.5 lb

## 2011-12-15 DIAGNOSIS — Z951 Presence of aortocoronary bypass graft: Secondary | ICD-10-CM | POA: Insufficient documentation

## 2011-12-15 DIAGNOSIS — E785 Hyperlipidemia, unspecified: Secondary | ICD-10-CM | POA: Insufficient documentation

## 2011-12-15 DIAGNOSIS — C50911 Malignant neoplasm of unspecified site of right female breast: Secondary | ICD-10-CM

## 2011-12-15 DIAGNOSIS — I10 Essential (primary) hypertension: Secondary | ICD-10-CM | POA: Insufficient documentation

## 2011-12-15 DIAGNOSIS — Z90710 Acquired absence of both cervix and uterus: Secondary | ICD-10-CM | POA: Insufficient documentation

## 2011-12-15 DIAGNOSIS — I2510 Atherosclerotic heart disease of native coronary artery without angina pectoris: Secondary | ICD-10-CM | POA: Insufficient documentation

## 2011-12-15 DIAGNOSIS — C50919 Malignant neoplasm of unspecified site of unspecified female breast: Secondary | ICD-10-CM | POA: Insufficient documentation

## 2011-12-15 DIAGNOSIS — C50419 Malignant neoplasm of upper-outer quadrant of unspecified female breast: Secondary | ICD-10-CM

## 2011-12-15 HISTORY — DX: Presence of automatic (implantable) cardiac defibrillator: Z95.810

## 2011-12-15 NOTE — Progress Notes (Signed)
[facility] Radiation Oncology Review Of Systems  Name: Dorothy Li MRN: [id number]  Date:  12/15/2011            DOB: 1946-02-17  Status:outpatient   Drug Allergies:  Allergies  Allergen Reactions  . Penicillins     Symptoms since last visit at this office:  CONSTITUTIONAL: None  EYES:Glasses and Other Eye Problems Cataracts  EARS/NOSE/THROAT: Runny Nose, Sinus Problems and Dental Problems-missing teeth.    HEART:Pacemaker/ICD  LUNG: None  STOMACH/BOWEL: None  GENITOURINARY: None  BREASTS: Here for consultation for Breast Cancer-Right  SKIN: itching on right breast from tape.  MUSCLE/BONES: Arthritis left arm and neck  NERVOUS SYSTEM:None  MENTAL HEALTH:None  HORMONES/REPRODUCTIVE:Menses age between 59-16, Parity mid to late 20's, G7, 3 miscarriages, hysterectomy age 26, BC x 3 years, No HRT   BLOOD/LYMPH SYSTEM: None  IMMUNE: None  ADDITIONAL INFORMATION/CONCERNS:

## 2011-12-15 NOTE — Progress Notes (Signed)
Travis today for Breast Cancer- Right.  Denies pain ,but has clearing rash and itching from tape.  Very dynamic and engaging personality.

## 2011-12-15 NOTE — Telephone Encounter (Signed)
Dr Shirlee Latch received request for surgical clearance from Dr Jamey Ripa. Dr Shirlee Latch has completed request form from Dr Jamey Ripa. Dr Shirlee Latch has stated: if doing well with no new symptoms, stable for surgery with no further testing. Higher risk due to decreased EF. She does have ICD---will need to be addressed perioperatively. I spoke to pt. Pt states she does not have any  new cardiac symptoms and is doing well from a cardiac standpoint. She states surgeon is aware she has an ICD. I have faxed the completed request form to Dr Tenna Child office attn; Lesly Rubenstein. I have returned the form to HIM.

## 2011-12-15 NOTE — Progress Notes (Signed)
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 female who was found on screening mammography to have a right breast lesion. She did not self palpate any lesions. The left breast was negative. Ultrasound was performed on 12/08/2011 which confirmed a 6 x 8 x 5 mm focal area of hypoechoic shadowing at the 10:00 position of the right breast. Biopsy was performed and it demonstrated pathology as described above.  Patient could not undergo a MRI due to a defibrillator. She does not yet have a consultation with medical oncology. Her radiology and pathology were reviewed this morning at tumor board.  Otherwise, she is in her usual state of health. She does have some comorbidities as described below. She exercises regularly at the local [employer].   PREVIOUS RADIATION THERAPY: No  PAST MEDICAL HISTORY:  has a past medical history of HTN (hypertension); ETOH abuse; Hyperlipemia; Nephrolithiasis; Cardiomyopathy; VT (ventricular tachycardia); CAD (coronary artery disease); Carotid stenosis; Syncope; ARF (acute renal failure); Cancer; and ICD (implantable cardiac defibrillator) in place ([employer]tronic).    PAST SURGICAL HISTORY: Past Surgical History  Procedure Date  . Icd implantation     ICD-Medtronic    Remote - No /  Hx of VT  . Coronary artery bypass graft      x 2  . Abdominal hysterectomy   . Breast biopsy 12/08/11    Right Breast, Upper Outer Quadrant: Invasive Ductal:DCIS    FAMILY HISTORY: family history includes Breast cancer in her cousin and Heart disease in her sister.  SOCIAL HISTORY:  reports that she quit smoking about 2 years ago. Her smoking  use included Cigarettes. She has a 40 pack-year smoking history. She does not have any smokeless tobacco history on file. She reports that she does not drink alcohol or use illicit drugs.  ALLERGIES: Penicillins and Tape  MEDICATIONS:  Current Outpatient Prescriptions  Medication Sig Dispense Refill  . acetaminophen (TYLENOL) 325 MG tablet Use two tablets as needed for shoulder pain      . aspirin EC 81 MG tablet Take 1 tablet (81 mg total) by mouth daily.      . Calcium Carbonate-Vitamin D (CALCIUM 600-D) 600-400 MG-UNIT per tablet Take by mouth daily.       . carvedilol (COREG) 12.5 MG tablet Take 1 tablet (12.5 mg total) by mouth 2 (two) times daily.  60 tablet  6  . enalapril (VASOTEC) 2.5 MG tablet Take 1 tablet (2.5 mg total) by mouth 2 (two) times daily.  60 tablet  6  . furosemide (LASIX) 20 MG tablet every other day. Take one every other day       . hydrALAZINE (APRESOLINE) 25 MG tablet Take 1 tablet (25 mg total) by mouth 3 (three) times daily.  90 tablet  6  . isosorbide mononitrate (IMDUR) 30 MG 24 hr tablet Take 1 tablet (30 mg total) by mouth daily.  30 tablet  6  . potassium chloride (KLOR-CON) 10 MEQ CR tablet every other day. Take one every other day       . rosuvastatin (CRESTOR) 40 MG tablet Take 1 tablet (40 mg total) by mouth daily.  30 tablet  6  . spironolactone (  ALDACTONE) 25 MG tablet Take 1 tablet (25 mg total) by mouth daily.  30 tablet  6    REVIEW OF SYSTEMS:  A 15 point review of systems is documented in the electronic medical record. This was obtained by the nursing staff. However, I reviewed this with the patient to discuss relevant findings and make appropriate changes.    CONSTITUTIONAL: None  EYES:Glasses and Other Eye Problems Cataracts  EARS/NOSE/THROAT: Runny Nose, Sinus Problems and Dental Problems-missing teeth.  HEART:Pacemaker/ICD  LUNG: None  STOMACH/BOWEL: None  GENITOURINARY: None  BREASTS: Here for consultation for Breast Cancer-Right  SKIN:  itching on right breast from tape.  MUSCLE/BONES: Arthritis left arm and neck  NERVOUS SYSTEM:None  MENTAL HEALTH:None  HORMONES/REPRODUCTIVE:Menses age between 66-16, Parity mid to late 20's, G60, 3 miscarriages, hysterectomy age 43, BC x 3 years, No HRT  BLOOD/LYMPH SYSTEM: None  IMMUNE: None  PHYSICAL EXAM:  weight is 170 lb 8 oz (77.338 kg). Her temperature is 99.2 F (37.3 C). Her blood pressure is 81/57 and her pulse is 103.   She is in no acute distress, General: Alert and oriented, in no acute distress HEENT: Head is normocephalic.  Extraocular movements are intact. Oropharynx is clear. Neck: Neck is supple, no palpable cervical or supraclavicular lymphadenopathy. Heart: Regular in rate and rhythm with no murmurs, rubs, or gallops. Chest: Clear to auscultation bilaterally, with no rhonchi, wheezes, or rales. She has a palpable defibrillator in the left upper chest. Abdomen: Soft, nontender, nondistended, with no rigidity or guarding. Extremities: No cyanosis or edema. Lymphatics: No concerning lymphadenopathy. Skin: No concerning lesions. Musculoskeletal: symmetric strength and muscle tone throughout. Neurologic: Cranial nerves II through XII are grossly intact. No obvious focalities. Speech is fluent. Coordination is intact. Psychiatric: Judgment and insight are intact. Affect is appropriate. Breasts: There is a small biopsy scar in the upper outer quadrant of the right breast. No palpable seroma or tumor is appreciated. No palpable axillary adenopathy on either side nor any palpable lesions of the left breast   LABORATORY DATA:  Lab Results  Component Value Date   WBC 8.8 01/26/2011   HGB 11.7* 01/26/2011   HCT 33.9* 01/26/2011   MCV 86.7 01/26/2011   PLT 217 01/26/2011   CMP     Component Value Date/Time   NA 138 11/11/2011 0840   K 4.1 11/11/2011 0840   CL 108 11/11/2011 0840   CO2 22 11/11/2011 0840   GLUCOSE 92 11/11/2011 0840   BUN 25* 11/11/2011 0840   CREATININE 1.0  11/11/2011 0840   CALCIUM 9.2 11/11/2011 0840   PROT 8.1 01/26/2011 1042   ALBUMIN 4.0 01/26/2011 1042   AST 16 01/26/2011 1042   ALT 17 01/26/2011 1042   ALKPHOS 90 01/26/2011 1042   BILITOT 0.2* 01/26/2011 1042   GFRNONAA 19* 01/27/2011 0605   GFRAA 23* 01/27/2011 0605      RADIOGRAPHY: US Breast Right  12/08/2011  *RADIOLOGY REPORT*  Clinical Data:  66 year old female with abnormal screening mammogram - possible right breast distortion.  DIGITAL DIAGNOSTIC RIGHT MAMMOGRAM  AND RIGHT BREAST ULTRASOUND:  Comparison:  11/24/2011 and prior mammograms dating back to 10/02/2009.  Findings:  CC and MLO spot compression views of the upper outer right breast demonstrate a focal area of distortion without calcifications.  On physical exam, no palpable abnormalities identified in the upper outer right breast.  Ultrasound is performed, showing a 6 x 8 x 5 mm focal area of hypoechoic shadowing at the 10 o'clock position of the  right breast 4 cm from the nipple. No enlarged or abnormal-appearing lymph nodes in the right axilla are identified.  IMPRESSION: 6 x 8 x 5 mm hypoechoic shadowing in the upper outer right breast - suspicious.  This finding was discussed with the patient and she desires to proceed with ultrasound guided right breast biopsy.  BI-RADS CATEGORY 4:  Suspicious abnormality - biopsy should be considered.  Recommend ultrasound guided right breast biopsy which will be performed today but dictated in a separate report.  Original Report Authenticated By: Rosendo Gros, M.D.   [place] Core Biopsy  12/09/2011  *RADIOLOGY REPORT*  Clinical Data:  66 year old female with suspicious hypoechoic shadowing/distortion in the upper outer right breast - for tissue sampling.  ULTRASOUND GUIDED VACUUM ASSISTED CORE BIOPSY OF THE RIGHT BREAST  Comparison: Recent mammograms and ultrasound.  I met with the patient and we discussed the procedure of ultrasound- guided biopsy, including benefits and alternatives.  We discussed the  high likelihood of a successful procedure. We discussed the risks of the procedure including infection, bleeding, tissue injury, clip migration, and inadequate sampling.  Informed written consent was given.  Using sterile technique, 2% lidocaine ultrasound guidance and a 12 gauge vacuum assisted needle biopsy was performed of the 6 x 8 mm hypoechoic shadowing in the 10 o'clock position of the right breast 4 cm from the nipple.  At the conclusion of the procedure, a tissue marker clip was deployed into the biopsy cavity.  Follow-up 2-view mammogram was performed and dictated separately.  IMPRESSION: Ultrasound-guided biopsy of right breast hypoechoic shadowing.  No apparent complications.  Final pathology demonstrates INVASIVE DUCTAL CARCINOMA AND DCIS. Histology correlates with imaging findings.  The patient was contacted by phone on 12/09/2011 and these results given to her which she understood. Her questions were answered. The patient had no complaints with her biopsy site.  Recommend surgery/oncology consultation of the right breast.  An appointment with Dr. Jamey Ripa [facility]) has been scheduled for 12/13/2011 and the patient informed. As the patient has a defibrillator, an MRI cannot be performed.  Original Report Authenticated By: Rosendo Gros, M.D.   Mm Digital Diag Ltd R  12/08/2011  *RADIOLOGY REPORT*  Clinical Data:  66 year old female with abnormal screening mammogram - possible right breast distortion.  DIGITAL DIAGNOSTIC RIGHT MAMMOGRAM  AND RIGHT BREAST ULTRASOUND:  Comparison:  11/24/2011 and prior mammograms dating back to 10/02/2009.  Findings:  CC and MLO spot compression views of the upper outer right breast demonstrate a focal area of distortion without calcifications.  On physical exam, no palpable abnormalities identified in the upper outer right breast.  Ultrasound is performed, showing a 6 x 8 x 5 mm focal area of hypoechoic shadowing at the 10 o'clock position of the right  breast 4 cm from the nipple. No enlarged or abnormal-appearing lymph nodes in the right axilla are identified.  IMPRESSION: 6 x 8 x 5 mm hypoechoic shadowing in the upper outer right breast - suspicious.  This finding was discussed with the patient and she desires to proceed with ultrasound guided right breast biopsy.  BI-RADS CATEGORY 4:  Suspicious abnormality - biopsy should be considered.  Recommend ultrasound guided right breast biopsy which will be performed today but dictated in a separate report.  Original Report Authenticated By: Rosendo Gros, M.D.   Mm Digital Diagnostic Unilat R  12/08/2011  *RADIOLOGY REPORT*  Clinical Data:  Evaluate clip placement following ultrasound guided right breast biopsy.  DIGITAL DIAGNOSTIC RIGHT MAMMOGRAM  Comparison:  [facility]  Findings:  Films are performed following ultrasound guided biopsy of 6 x 8 mm hypoechoic shadowing/distortion in the upper outer right breast.  The ribbon shaped biopsy clip is in satisfactory position along the posterior aspect of the mammographic distortion.  IMPRESSION: Satisfactory clip placement following ultrasound guided breast biopsy.  Pathology will be followed.  Original Report Authenticated By: Rosendo Gros, M.D.   Mm Digital Screening  11/24/2011  *RADIOLOGY REPORT*  Clinical Data: Screening.  MAMMOGRAPHIC BILATERAL DIGITAL SCREENING WITH CAD  Findings: Two views of each breast demonstrate heterogeneously dense tissue.  In the right breast, possible distortion warrants further evaluation with spot compression views and possibly ultrasound.  In the left breast, no masses or malignant type calcifications are identified.  Compared with priors.  Images were processed with CAD.  IMPRESSION: Further evaluation is suggested for possible distortion in the right breast.  RECOMMENDATION: Diagnostic mammogram and possibly ultrasound of the right breast. (Code:FI-R-50M)  BI-RADS CATEGORY 0:  Incomplete.  Need additional imaging evaluation and/or  prior mammograms for comparison.  Original Report Authenticated By: Rosendo Gros, M.D.      IMPRESSION/PLAN: This is a very pleasant 66 year old woman with pathologic T1b N0 M0 ER/PR positive HER-2/neu negative breast cancer. Based on her tumor characteristics and imaging, I think she would be an appropriate candidate for partial breast MammoSite brachytherapy or alternatively whole breast radiotherapy. We had a detailed discussion regarding the risks benefits and side effects of each modality of adjuvant radiotherapy.  Ultimately, the patient prefers whole breast radiotherapy.  We discussed that radiation would take approximately 6 weeks to complete and that I would give the patient a few weeks to heal following surgery before starting treatment planning. We spoke about acute effects including skin irritation and fatigue as well as much less common late effects including lung  irritation. We spoke about the latest technology that is used to minimize the risk of late effects for breast cancer patients undergoing radiotherapy. No guarantees of treatment were given. The patient is enthusiastic about proceeding with treatment. I look forward to participating in the patient's care.  In the meantime, I will make a referral to medical oncology as she has not seen someone in that department yet. I anticipate that she'll be a candidate for antiestrogen therapy.   I will also contact the patient's cardiologist to let them know that she's going to be receiving radiotherapy. I think we will be able to avoid her defibrillator satisfactorily.  I spent 60 minutes minutes face to face with the patient and more than 50% of that time was spent in counseling and/or coordination of care. She has my contact information in case any questions arise before she is referred back to me postoperatively.   -----------------------------------  Lonie Peak, MD

## 2011-12-16 ENCOUNTER — Other Ambulatory Visit (INDEPENDENT_AMBULATORY_CARE_PROVIDER_SITE_OTHER): Payer: Self-pay | Admitting: Surgery

## 2011-12-16 ENCOUNTER — Telehealth (INDEPENDENT_AMBULATORY_CARE_PROVIDER_SITE_OTHER): Payer: Self-pay | Admitting: Surgery

## 2011-12-16 DIAGNOSIS — C50911 Malignant neoplasm of unspecified site of right female breast: Secondary | ICD-10-CM

## 2011-12-16 NOTE — Telephone Encounter (Signed)
Talked to her and she wishes to go ahead and schedule surgery. She wants whole breast radiation, so will not need a mammosite

## 2011-12-17 ENCOUNTER — Telehealth: Payer: Self-pay | Admitting: *Deleted

## 2011-12-17 NOTE — Telephone Encounter (Signed)
Confirmed 12/21/11 appt w /pt.  Mailed before appt letter & packet to pt.  Took paperwork to Med Rec for chart.

## 2011-12-20 ENCOUNTER — Other Ambulatory Visit (INDEPENDENT_AMBULATORY_CARE_PROVIDER_SITE_OTHER): Payer: Self-pay | Admitting: Surgery

## 2011-12-20 ENCOUNTER — Other Ambulatory Visit: Payer: Self-pay | Admitting: Cardiology

## 2011-12-20 DIAGNOSIS — C50911 Malignant neoplasm of unspecified site of right female breast: Secondary | ICD-10-CM

## 2011-12-20 NOTE — Telephone Encounter (Signed)
Refill-Crestor    Patient also requested refill on potassium chloride (KLOR-CON) 10 MEQ CR tablet Med, recvd error msg stating this medication is no longer available.  Please return call to patient to advise [phone]

## 2011-12-21 ENCOUNTER — Other Ambulatory Visit: Payer: Medicare Other | Admitting: Lab

## 2011-12-21 ENCOUNTER — Ambulatory Visit (HOSPITAL_BASED_OUTPATIENT_CLINIC_OR_DEPARTMENT_OTHER): Payer: Medicare Other | Admitting: Oncology

## 2011-12-21 ENCOUNTER — Ambulatory Visit: Payer: Medicare Other

## 2011-12-21 ENCOUNTER — Encounter: Payer: Self-pay | Admitting: Oncology

## 2011-12-21 ENCOUNTER — Other Ambulatory Visit: Payer: Self-pay | Admitting: *Deleted

## 2011-12-21 ENCOUNTER — Other Ambulatory Visit: Payer: Self-pay | Admitting: Oncology

## 2011-12-21 VITALS — BP 98/54 | HR 91 | Temp 99.2°F | Ht 68.5 in | Wt 170.3 lb

## 2011-12-21 DIAGNOSIS — Z170 Estrogen receptor positive status [ER+]: Secondary | ICD-10-CM

## 2011-12-21 DIAGNOSIS — C50911 Malignant neoplasm of unspecified site of right female breast: Secondary | ICD-10-CM

## 2011-12-21 DIAGNOSIS — C50419 Malignant neoplasm of upper-outer quadrant of unspecified female breast: Secondary | ICD-10-CM

## 2011-12-21 MED ORDER — ROSUVASTATIN CALCIUM 40 MG PO TABS: 40.0000 mg | ORAL_TABLET | Freq: Every day | ORAL | Status: DC

## 2011-12-21 MED ORDER — POTASSIUM CHLORIDE ER 10 MEQ PO TBCR: 10.0000 meq | EXTENDED_RELEASE_TABLET | ORAL | Status: DC

## 2011-12-21 NOTE — Progress Notes (Signed)
Dorothy Li [id number] [birth year] 66 y.o. 12/21/2011 1:41 PM  CC  Norberto Sorenson, MD [address] [place] [address] Dr. Vallery Sa Dr. Marca Ancona Dr. 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  STAGE:   Breast cancer, right breast   Primary site: Breast (Right)   Staging method: AJCC 7th Edition   Clinical: Stage IA (T1b, N0, cM0) signed by Currie Paris, MD on 12/16/2011  4:57 PM   Summary: Stage IA (T1b, N0, cM0)  REFERRING PHYSICIAN: Dr. Cyndia Bent  HISTORY OF PRESENT ILLNESS:  Dorothy Li is a 67 y.o. female.  With medical history significant for hypertension and coronary artery disease patient is status post bypass graft ventricular tachycardia chronic systolic heart failure. Recently patient had a screening mammogram performed and she was found to have a right breast lesion. The left breast was negative.went on to have an ultrasound performed that showed a 6 x 8 x 5 mm area of hypoechoic shadowing at the 10:00 position. Her ultrasound was performed on 12/08/2011. She then had a biopsy performed that showed a low-grade invasive ductal carcinoma ER positive PR positive HER-2/neu negative. Patient has been seen by Dr. Cyndia Bent who has recommended a lumpectomy. Patient also has been seen by radiation oncology for consideration of postlumpectomy radiation. She is seen now in medical oncology for discussion of adjuvant treatment for systemic control of disease. She is without any complaints.   Past Medical History: Past Medical History  Diagnosis Date  . HTN (hypertension)   . ETOH abuse      Elevated AST, mildly elevated INR (? component of cirrhosis). Has now quit ETOH.   [name] Hyperlipemia   . Nephrolithiasis   . Cardiomyopathy     Echo (4/11) with EF 25%, diffuse hypokinesis,  moderate LV dilation, decreased RV  systolic function but normal RV size, PA systolic pressure 35 mmHg. Lexiscan myoview (4/11): EF 29%,  diffuse hypokinesis, basal anteroseptal and apical scar, no ischemia.  The cardiomyopathy is likely due to a combination of CAD (LM stenosis) and ETOH abuse.   . VT (ventricular tachycardia)     Dual chamber Medtronic ICD placed 7/11  . CAD (coronary artery disease)     LHC (4/11) showed 75% ostial LM stenosis with significant damping upon catheter engagement, 60-70% mLAD, 60% ostial D1, EF 30%.  Patient had CABG with LIMA-LAD and SVG-OM2.   . Carotid stenosis     carotid dopplers (4/11) with 60-79% LICA stenosis.  Carotid dopplers (10/11): 60-79%  LICA stenosis.  . Syncope   . ARF (acute renal failure)     in setting dehydration with ACEI use   . Cancer     right breast  . ICD (implantable cardiac defibrillator) in place Medtronic    Interrogated on 11/18/11    Past Surgical History: Past Surgical History  Procedure Date  . Icd implantation     ICD-Medtronic    Remote - No /  Hx of VT  . Coronary artery bypass graft      x 2  . Abdominal hysterectomy   . Breast biopsy 12/08/11    Right Breast, Upper Outer Quadrant: Invasive Ductal:DCIS    Family History: Family History  Problem Relation Age of Onset  . Heart disease Sister     Had surgery  . Breast cancer Cousin  Social History History  Substance Use Topics  . Smoking status: Former Smoker -- 1.0 packs/day for 40 years    Types: Cigarettes    Quit date: 10/08/2009  . Smokeless tobacco: Not on file  . Alcohol Use: No     Prio hx 2 drinks daily.  quit 09/2009    Allergies: Allergies  Allergen Reactions  . Penicillins   . Tape Itching and Rash    Current Medications: Current Outpatient Prescriptions  Medication Sig Dispense Refill  . acetaminophen (TYLENOL) 325 MG tablet Use two tablets as needed for shoulder pain      . aspirin EC 81 MG tablet Take 1 tablet (81 mg total) by  mouth daily.      . Calcium Carbonate-Vitamin D (CALCIUM 600-D) 600-400 MG-UNIT per tablet Take by mouth daily.       . carvedilol (COREG) 12.5 MG tablet Take 1 tablet (12.5 mg total) by mouth 2 (two) times daily.  60 tablet  6  . enalapril (VASOTEC) 2.5 MG tablet Take 1 tablet (2.5 mg total) by mouth 2 (two) times daily.  60 tablet  6  . furosemide (LASIX) 20 MG tablet every other day. Take one every other day       . hydrALAZINE (APRESOLINE) 25 MG tablet Take 1 tablet (25 mg total) by mouth 3 (three) times daily.  90 tablet  6  . isosorbide mononitrate (IMDUR) 30 MG 24 hr tablet Take 1 tablet (30 mg total) by mouth daily.  30 tablet  6  . potassium chloride (KLOR-CON) 10 MEQ CR tablet every other day. Take one every other day       . rosuvastatin (CRESTOR) 40 MG tablet Take 1 tablet (40 mg total) by mouth daily.  30 tablet  6  . spironolactone (ALDACTONE) 25 MG tablet Take 1 tablet (25 mg total) by mouth daily.  30 tablet  6    OB/GYN History:menarche between 12 and 13 she is postmenopausal currently she is not taking any hormone replacement therapy.  Fertility Discussion:has completed her family Prior History of Cancer:prior history of cancers  ECOG PERFORMANCE STATUS: 1 - Symptomatic but completely ambulatory  Genetic Counseling/testing:patient has no previous history in the family of having cancer Center for genetic counseling and testing at this time is not recommended  REVIEW OF SYSTEMS:  A comprehensive review of systems was negative.  PHYSICAL EXAMINATION: Blood pressure 98/54, pulse 91, temperature 99.2 F (37.3 C), temperature source Oral, height 5' 8.5" (1.74 m), weight 170 lb 4.8 oz (77.248 kg).  MWU:XLKGM, healthy and no distress SKIN: skin color, texture, turgor are normal HEAD: No masses, lesions, tenderness or abnormalities EYES: PERRLA, EOMI, Conjunctiva are pink and non-injected, sclera clear EARS: External ears normal OROPHARYNX:no exudate, no erythema and lips,  buccal mucosa, and tongue normal  NECK: no adenopathy LYMPH:  no palpable lymphadenopathy, no hepatosplenomegaly BREAST:bilateral breast examination is performed patient on the right side has a barely palpable area likely due to her recent biopsy. Otherwise no masses nipple discharge in either of the breasts. LUNGS: clear to auscultation and percussion HEART: regular rate & rhythm ABDOMEN:abdomen soft, non-tender, obese, normal bowel sounds and no masses or organomegaly BACK: Back symmetric, no curvature. EXTREMITIES:no edema, no clubbing, no cyanosis  NEURO: alert & oriented x 3 with fluent speech, no focal motor/sensory deficits     STUDIES/RESULTS: US Breast Right  12/08/2011  *RADIOLOGY REPORT*  Clinical Data:  66 year old female with abnormal screening mammogram - possible right breast distortion.  DIGITAL  DIAGNOSTIC RIGHT MAMMOGRAM  AND RIGHT BREAST ULTRASOUND:  Comparison:  11/24/2011 and prior mammograms dating back to 10/02/2009.  Findings:  CC and MLO spot compression views of the upper outer right breast demonstrate a focal area of distortion without calcifications.  On physical exam, no palpable abnormalities identified in the upper outer right breast.  Ultrasound is performed, showing a 6 x 8 x 5 mm focal area of hypoechoic shadowing at the 10 o'clock position of the right breast 4 cm from the nipple. No enlarged or abnormal-appearing lymph nodes in the right axilla are identified.  IMPRESSION: 6 x 8 x 5 mm hypoechoic shadowing in the upper outer right breast - suspicious.  This finding was discussed with the patient and she desires to proceed with ultrasound guided right breast biopsy.  BI-RADS CATEGORY 4:  Suspicious abnormality - biopsy should be considered.  Recommend ultrasound guided right breast biopsy which will be performed today but dictated in a separate report.  Original Report Authenticated By: Rosendo Gros, M.D.   [place] Core Biopsy  12/09/2011  *RADIOLOGY REPORT*  Clinical  Data:  66 year old female with suspicious hypoechoic shadowing/distortion in the upper outer right breast - for tissue sampling.  ULTRASOUND GUIDED VACUUM ASSISTED CORE BIOPSY OF THE RIGHT BREAST  Comparison: Recent mammograms and ultrasound.  I met with the patient and we discussed the procedure of ultrasound- guided biopsy, including benefits and alternatives.  We discussed the high likelihood of a successful procedure. We discussed the risks of the procedure including infection, bleeding, tissue injury, clip migration, and inadequate sampling.  Informed written consent was given.  Using sterile technique, 2% lidocaine ultrasound guidance and a 12 gauge vacuum assisted needle biopsy was performed of the 6 x 8 mm hypoechoic shadowing in the 10 o'clock position of the right breast 4 cm from the nipple.  At the conclusion of the procedure, a tissue marker clip was deployed into the biopsy cavity.  Follow-up 2-view mammogram was performed and dictated separately.  IMPRESSION: Ultrasound-guided biopsy of right breast hypoechoic shadowing.  No apparent complications.  Final pathology demonstrates INVASIVE DUCTAL CARCINOMA AND DCIS. Histology correlates with imaging findings.  The patient was contacted by phone on 12/09/2011 and these results given to her which she understood. Her questions were answered. The patient had no complaints with her biopsy site.  Recommend surgery/oncology consultation of the right breast.  An appointment with Dr. Jamey Ripa [facility]) has been scheduled for 12/13/2011 and the patient informed. As the patient has a defibrillator, an MRI cannot be performed.  Original Report Authenticated By: Rosendo Gros, M.D.   Mm Digital Diag Ltd R  12/08/2011  *RADIOLOGY REPORT*  Clinical Data:  66 year old female with abnormal screening mammogram - possible right breast distortion.  DIGITAL DIAGNOSTIC RIGHT MAMMOGRAM  AND RIGHT BREAST ULTRASOUND:  Comparison:  11/24/2011 and prior mammograms  dating back to 10/02/2009.  Findings:  CC and MLO spot compression views of the upper outer right breast demonstrate a focal area of distortion without calcifications.  On physical exam, no palpable abnormalities identified in the upper outer right breast.  Ultrasound is performed, showing a 6 x 8 x 5 mm focal area of hypoechoic shadowing at the 10 o'clock position of the right breast 4 cm from the nipple. No enlarged or abnormal-appearing lymph nodes in the right axilla are identified.  IMPRESSION: 6 x 8 x 5 mm hypoechoic shadowing in the upper outer right breast - suspicious.  This finding was discussed with the patient  and she desires to proceed with ultrasound guided right breast biopsy.  BI-RADS CATEGORY 4:  Suspicious abnormality - biopsy should be considered.  Recommend ultrasound guided right breast biopsy which will be performed today but dictated in a separate report.  Original Report Authenticated By: Rosendo Gros, M.D.   Mm Digital Diagnostic Unilat R  12/08/2011  *RADIOLOGY REPORT*  Clinical Data:  Evaluate clip placement following ultrasound guided right breast biopsy.  DIGITAL DIAGNOSTIC RIGHT MAMMOGRAM  Comparison:  Priors  Findings:  Films are performed following ultrasound guided biopsy of 6 x 8 mm hypoechoic shadowing/distortion in the upper outer right breast.  The ribbon shaped biopsy clip is in satisfactory position along the posterior aspect of the mammographic distortion.  IMPRESSION: Satisfactory clip placement following ultrasound guided breast biopsy.  Pathology will be followed.  Original Report Authenticated By: Rosendo Gros, M.D.   Mm Digital Screening  11/24/2011  *RADIOLOGY REPORT*  Clinical Data: Screening.  MAMMOGRAPHIC BILATERAL DIGITAL SCREENING WITH CAD  Findings: Two views of each breast demonstrate heterogeneously dense tissue.  In the right breast, possible distortion warrants further evaluation with spot compression views and possibly ultrasound.  In the left breast,  no masses or malignant type calcifications are identified.  Compared with priors.  Images were processed with CAD.  IMPRESSION: Further evaluation is suggested for possible distortion in the right breast.  RECOMMENDATION: Diagnostic mammogram and possibly ultrasound of the right breast. (Code:FI-R-50M)  BI-RADS CATEGORY 0:  Incomplete.  Need additional imaging evaluation and/or prior mammograms for comparison.  Original Report Authenticated By: Rosendo Gros, M.D.     LABS:    Chemistry      Component Value Date/Time   NA 138 11/11/2011 0840   K 4.1 11/11/2011 0840   CL 108 11/11/2011 0840   CO2 22 11/11/2011 0840   BUN 25* 11/11/2011 0840   CREATININE 1.0 11/11/2011 0840      Component Value Date/Time   CALCIUM 9.2 11/11/2011 0840   ALKPHOS 90 01/26/2011 1042   AST 16 01/26/2011 1042   ALT 17 01/26/2011 1042   BILITOT 0.2* 01/26/2011 1042      Lab Results  Component Value Date   WBC 8.8 01/26/2011   HGB 11.7* 01/26/2011   HCT 33.9* 01/26/2011   MCV 86.7 01/26/2011   PLT 217 01/26/2011    PATHOLOGY: ADDITIONAL INFORMATION: PROGNOSTIC INDICATORS - ACIS Results IMMUNOHISTOCHEMICAL AND MORPHOMETRIC ANALYSIS BY THE AUTOMATED CELLULAR IMAGING SYSTEM (ACIS) This invasive carcinoma shows the following breast prognostic profile. Estrogen Receptor (Negative, <1%): 100%, POSITIVE,STRONG STAINING INTENSITY Progesterone Receptor (Negative, <1%): 98%,POSITIVE, STRONG STAINING INTENSITY Proliferation Marker Ki67 by M IB-1 (Low<20%): 11% All controls stained appropriately Abigail Miyamoto MD Pathologist, Electronic Signature ( Signed 12/15/2011) CHROMOGENIC IN-SITU HYBRIDIZATION Interpretation HER-2/NEU BY CISH - NO AMPLIFICATION OF HER-2 DETECTED. THE RATIO OF HER-2: CEP 17 SIGNALS WAS 1.33. Reference range: Ratio: HER2:CEP17 < 1.8 - gene amplification not observed Ratio: HER2:CEP 17 1.8-2.2 - equivocal result Ratio: HER2:CEP17 > 2.2 - gene amplification observed Abigail Miyamoto MD Pathologist,  Electronic Signature ( Signed 12/14/2011) 1 of 2 FINAL for Dorothy Li, Dorothy Li [phone]) FINAL DIAGNOSIS Diagnosis Breast, right, needle core biopsy, upper outer, 10 o'clock - INVASIVE DUCTAL CARCINOMA. - DUCTAL CARCINOMA IN SITU. - SEE COMMENT. Microscopic Comment The carcinoma appears grade I. A breast prognostic profile will be performed and the results reported separately. The results were called to [facility] on 12/09/2011. (JBK:eps 12/09/11) Pecola Leisure MD Pathologist, Electronic Signature (Case  signed 12/09/2011) Specimen Gross and Clinical  ASSESSMENT    66 year old female with new diagnosis of early stage invasive ductal carcinoma of the right breast. Tumor measured around 8 mm. Core needle biopsy showed a low-grade invasive ductal carcinoma that was ER positive PR positive HER-2/neu negative grade 1. Patient is a Candidate for breast conservation with a lumpectomy and sentinel node biopsy. She has RE been seen by Dr. Cyndia Bent for this and she is scheduled to have her lumpectomy performed in the early part of July. She is a hearty been seen by Dr. Doristine Devoid for postlumpectomy radiation. Today I discussed the rationale for adjuvant systemic treatment with an antiestrogen therapy. I do think that she may be a good candidate for an aromatase inhibitor such as letrozole or Aromasin or Arimidex. Risks and benefits of the strokes were discussed with the patient. Her we will make the final decision of how to treat her based on her final pathology. Esters are answered today.    PLAN:    Patient will proceed with her surgery as scheduled. Per NCCNs we will consider treating her with antiestrogen therapy since she is ER positive PR positive and HER-2/neu negative.     Thank you so much for allowing me to participate in the care of Dorothy Li. I will continue to follow up the patient with you and assist in her care.  All questions were answered. The patient  knows to call the clinic with any problems, questions or concerns. We can certainly see the patient much sooner if necessary.  I spent 60 minutes counseling the patient face to face. The total time spent in the appointment was 60 minutes.  Drue Second, MD Medical/Oncology [facility] [phone] (beeper) [phone] (Office)  12/21/2011, 1:41 PM

## 2011-12-21 NOTE — Patient Instructions (Addendum)
Proceed with surgery scheduled for 01/19/12.  I will see you back on 7/23

## 2011-12-22 ENCOUNTER — Telehealth: Payer: Self-pay | Admitting: *Deleted

## 2011-12-22 NOTE — Telephone Encounter (Signed)
Made patient appointment for 01-25-2012 starting at 9:00am printed out calendar and gave to the patient

## 2011-12-24 ENCOUNTER — Telehealth: Payer: Self-pay | Admitting: Oncology

## 2011-12-24 NOTE — Telephone Encounter (Signed)
Received a voice message from Ms. Senner, stating she wants to schedule an appointment to speak with a Database administrator. Returned call, no answer. Left message with contact information for myself and Terald Sleeper.

## 2011-12-28 ENCOUNTER — Encounter: Payer: Self-pay | Admitting: Cardiology

## 2011-12-28 ENCOUNTER — Ambulatory Visit (INDEPENDENT_AMBULATORY_CARE_PROVIDER_SITE_OTHER): Payer: Medicare Other | Admitting: Cardiology

## 2011-12-28 ENCOUNTER — Encounter: Payer: Self-pay | Admitting: Oncology

## 2011-12-28 VITALS — BP 112/62 | HR 94 | Ht 68.5 in | Wt 170.0 lb

## 2011-12-28 DIAGNOSIS — C50919 Malignant neoplasm of unspecified site of unspecified female breast: Secondary | ICD-10-CM

## 2011-12-28 DIAGNOSIS — E7800 Pure hypercholesterolemia, unspecified: Secondary | ICD-10-CM

## 2011-12-28 DIAGNOSIS — I25810 Atherosclerosis of coronary artery bypass graft(s) without angina pectoris: Secondary | ICD-10-CM

## 2011-12-28 DIAGNOSIS — C50911 Malignant neoplasm of unspecified site of right female breast: Secondary | ICD-10-CM

## 2011-12-28 DIAGNOSIS — I5022 Chronic systolic (congestive) heart failure: Secondary | ICD-10-CM

## 2011-12-28 DIAGNOSIS — I6529 Occlusion and stenosis of unspecified carotid artery: Secondary | ICD-10-CM

## 2011-12-28 LAB — LIPID PANEL
Cholesterol: 114 mg/dL (ref 0–200)
LDL Cholesterol: 50 mg/dL (ref 0–99)
Total CHOL/HDL Ratio: 2

## 2011-12-28 LAB — BASIC METABOLIC PANEL
CO2: 21 mEq/L (ref 19–32)
Chloride: 107 mEq/L (ref 96–112)
Potassium: 4.4 mEq/L (ref 3.5–5.1)

## 2011-12-28 MED ORDER — HYDRALAZINE HCL 25 MG PO TABS: 37.5000 mg | ORAL_TABLET | Freq: Three times a day (TID) | ORAL | Status: DC

## 2011-12-28 MED ORDER — ISOSORBIDE MONONITRATE ER 60 MG PO TB24: 60.0000 mg | ORAL_TABLET | Freq: Every day | ORAL | Status: DC

## 2011-12-28 NOTE — Assessment & Plan Note (Signed)
Plan for lumpectomy followed by radiation.  Stable from a cardiac standpoint, no stress test needed before procedure.  Should continue cardiac meds peri-operatively.

## 2011-12-28 NOTE — Assessment & Plan Note (Signed)
Doing well symptomatically, NYHA class II.  She is not volume overloaded on exam.  - Stop Lasix and KCl.  Hopefully will prevent development of dehydration this summer (she also now has air conditioning).   - Continue current enalapril, Coreg, and spironolactone.  I will not increase enalapril further at this time, has had ARF in the past with ACEI increase (in association with dehydration).  - Increase hydralazine to 37.5 mg tid and Imdur to 60 mg daily.  - Continue exercising at [facility].  - BMET today.

## 2011-12-28 NOTE — Assessment & Plan Note (Signed)
Stable with no ischemic symptoms.  Continue ASA 81, beta blocker, ACEI, statin.  

## 2011-12-28 NOTE — Assessment & Plan Note (Signed)
Moderate LICA stenosis, repeat carotids in 11/13. 

## 2011-12-28 NOTE — Patient Instructions (Addendum)
Please stop Lasix and Potassium.  Increase Imdur (Isosorbide) to 60 mg a day and Hydralazine to 37.5 mg (1 and 1/2 tablet) three times a day. Continue all other medications as listed.  Please have blood work today  Follow up with Dr Shirlee Latch in 2 months.

## 2011-12-28 NOTE — Progress Notes (Signed)
Returned patients called today, left message for patient to call back when available. Patient was wanting to discuss about her financial status and surgery.

## 2011-12-28 NOTE — Assessment & Plan Note (Signed)
Check lipids today.  Goal LDL < 70.

## 2011-12-28 NOTE — Progress Notes (Signed)
Patient ID: Dorothy Li, female   DOB: 11-Dec-1945, 66 y.o.   MRN: [id number] PCP: Dr. Clelia Croft at [facility]  66 yo with history of cardiomyopathy s/p ICD, and CAD s/p CABG presents for followup.   Patient had a left heart cath in 4/11 which showed a 75% ostial left main stenosis. She had CABG x 2.  She has moderate to severe LV systolic dysfunction that I think is a mixed picture, due to both CAD and probably ETOH abuse.  Since her CABG, she has quit smoking and quit drinking ETOH. She had syncope and episodes of VT on monitor, so Medtronic ICD was placed in 7/11.  Last summer, patient had an episode of profound dehydration with renal failure (house not air conditioned, poor po intake with use of Lasix).  This resolved with rehydration.   Dorothy Li has been doing well symptomatically since I last saw her.  No exertional dyspnea or chest pain.  No lightheadedness.  No orthopnea or PND.  She has been doing an exercise class at the [facility] several days a week.  Unfortunately, she has been diagnosed with breast cancer.  She is going to have a lumpectomy in July followed by radiation.    Of note, she now has an air conditioning at home so hopefully will not develop profound dehydration this summer.   Labs (8/11): BNP 181 Labs (9/11): K 4.4, creatinine 0.6 Labs (10/11): BNP 95 Labs (11/11): BNP 65, creatinine 0.8, K 4.7, LDL 86, HDL 55 Labs (12/11): K 4.5, creatinine 0.7 Labs (1/12): LDL 69, HDL 41 Labs (6/12): K 4.9, creatinine 0.97, LDL 82, HDL 45 Labs (7/12): creatinine 3.36 => 2.57 Labs (9/12): K 3.7, creatinine 0.9, BNP 65 Labs (10/12): K 4.4, creatinine 0.8 Labs (2/13): K 4.3, creatinine 0.82, LDL 77, HDL 45 Labs (5/13): K 4.1, creatinine 1.0  Allergies:  1)  ! Pcn  Past Medical History: 1. HTN 2. ETOH abuse: Elevated AST, mildly elevated INR (? component of cirrhosis). Has now quit ETOH.  3. History of ARF in setting of dehydration/ACEI in the summer of 2011 and again in 2012.  4.  Hyperlipidemia 5. Nephrolithiasis 6. Cardiomyopathy: Echo (4/11) with EF 25%, diffuse hypokinesis, moderate LV dilation, decreased RV systolic function but normal RV size, PA systolic pressure 35 mmHg. Lexiscan myoview (4/11): EF 29%, diffuse hypokinesis, basal anteroseptal and apical scar, no ischemia.  The cardiomyopathy is likely due to a combination of CAD (LM stenosis) and ETOH abuse.  ANA and SPEP were negative.  Echo (6/11): EF 25%, mild diastolic dysfunction, normal RV, no pulmonary HTN 7. VT: Dual chamber Medtronic ICD placed 7/11 8. CAD: LHC (4/11) showed 75% ostial LM stenosis with significant damping upon catheter engagement, 60-70% mLAD, 60% ostial D1, EF 30%.  Patient had CABG with LIMA-LAD and SVG-OM2.  9. carotid stenosis: carotid dopplers (4/11) with 60-79% LICA stenosis.  Carotid dopplers (10/11): 60-79% LICA stenosis.  Carotids (4/12): 60-79% LICA.  Carotids (11/12): 60-79% LICA.   Carotids (5/13): 60-79% LICA.  10.  Syncope 11. Breast cancer  Family History: Sister with heart surgery, she is not sure why.   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 Married, lives in [place] Quit smoking 4/11 Prior moderate ETOH use (2 liquor drinks daily) but quit in 3/11.  No drug use and negative urine drug screen in 2/11  Review of Systems        All systems reviewed and negative except as per HPI.   Current Outpatient Prescriptions  Medication Sig Dispense Refill  . acetaminophen (TYLENOL) 325 MG tablet Use two tablets as needed for shoulder pain      . aspirin EC 81 MG tablet Take 1 tablet (81 mg total) by mouth daily.      . Calcium Carbonate-Vitamin D (CALCIUM 600-D) 600-400 MG-UNIT per tablet Take by mouth daily.       . carvedilol (COREG) 12.5 MG tablet Take 1 tablet (12.5 mg total) by mouth 2 (two) times daily.  60 tablet  6  . enalapril (VASOTEC) 2.5 MG tablet Take 1 tablet (2.5 mg total) by mouth 2 (two) times daily.  60 tablet  6  . hydrALAZINE  (APRESOLINE) 25 MG tablet Take 1.5 tablets (37.5 mg total) by mouth 3 (three) times daily.  135 tablet  6  . isosorbide mononitrate (IMDUR) 60 MG 24 hr tablet Take 1 tablet (60 mg total) by mouth daily.  30 tablet  6  . rosuvastatin (CRESTOR) 40 MG tablet Take 1 tablet (40 mg total) by mouth daily.  90 tablet  1  . spironolactone (ALDACTONE) 25 MG tablet Take 1 tablet (25 mg total) by mouth daily.  30 tablet  6  . DISCONTD: hydrALAZINE (APRESOLINE) 25 MG tablet Take 1 tablet (25 mg total) by mouth 3 (three) times daily.  90 tablet  6  . DISCONTD: isosorbide mononitrate (IMDUR) 30 MG 24 hr tablet Take 1 tablet (30 mg total) by mouth daily.  30 tablet  6    BP 112/62  Pulse 94  Ht 5' 8.5" (1.74 m)  Wt 77.111 kg (170 lb)  BMI 25.47 kg/m2  SpO2 99% General:  Well developed, well nourished, in no acute distress. Neck:  Neck supple, no JVD. No masses, thyromegaly or abnormal cervical nodes. Lungs:  Clear bilaterally to auscultation and percussion. Heart:  Non-displaced PMI, chest non-tender; regular rate and rhythm, S1, S2 without murmurs, rubs. +S4. Carotid upstroke normal, no bruit.  Pedals normal pulses. No edema, no varicosities. Abdomen:  Bowel sounds positive; abdomen soft and non-tender without masses, organomegaly, or hernias noted. No hepatosplenomegaly. Extremities:  No clubbing or cyanosis. Neurologic:  Alert and oriented x 3. Psych:  Normal affect.

## 2011-12-29 ENCOUNTER — Encounter: Payer: Self-pay | Admitting: *Deleted

## 2011-12-29 NOTE — Progress Notes (Signed)
Mailed after appt letter to pt. 

## 2011-12-31 ENCOUNTER — Telehealth: Payer: Self-pay | Admitting: Oncology

## 2011-12-31 NOTE — Telephone Encounter (Signed)
Returned patients call spoke with patient, patient was inquiring about some financial assistance for surgery. Explained to patient the process of the EPP application for qualification. Patient will be coming by on today to pick up application.

## 2012-01-05 ENCOUNTER — Encounter: Payer: Self-pay | Admitting: Oncology

## 2012-01-05 ENCOUNTER — Encounter (HOSPITAL_COMMUNITY): Payer: Self-pay | Admitting: Pharmacy Technician

## 2012-01-05 NOTE — Progress Notes (Signed)
Patient approved for 65% discount for family of 2 income 24120.00

## 2012-01-12 ENCOUNTER — Encounter (HOSPITAL_COMMUNITY): Payer: Self-pay

## 2012-01-12 ENCOUNTER — Encounter (HOSPITAL_COMMUNITY)
Admission: RE | Admit: 2012-01-12 | Discharge: 2012-01-12 | Disposition: A | Discharge status: Home / Self Care | Payer: Medicare Other | Source: Ambulatory Visit | Attending: Surgery | Admitting: Surgery

## 2012-01-12 HISTORY — DX: Unspecified osteoarthritis, unspecified site: M19.90

## 2012-01-12 LAB — DIFFERENTIAL
Basophils Absolute: 0.1 10*3/uL (ref 0.0–0.1)
Eosinophils Absolute: 0.5 10*3/uL (ref 0.0–0.7)
Eosinophils Relative: 6 % — ABNORMAL HIGH (ref 0–5)
Lymphocytes Relative: 43 % (ref 12–46)
Monocytes Absolute: 0.6 10*3/uL (ref 0.1–1.0)

## 2012-01-12 LAB — URINALYSIS, ROUTINE W REFLEX MICROSCOPIC
Bilirubin Urine: NEGATIVE
Glucose, UA: NEGATIVE mg/dL
Hgb urine dipstick: NEGATIVE
Ketones, ur: NEGATIVE mg/dL
Nitrite: NEGATIVE
Specific Gravity, Urine: 1.019 (ref 1.005–1.030)
pH: 5.5 (ref 5.0–8.0)

## 2012-01-12 LAB — COMPREHENSIVE METABOLIC PANEL
ALT: 11 U/L (ref 0–35)
AST: 14 U/L (ref 0–37)
Alkaline Phosphatase: 76 U/L (ref 39–117)
CO2: 23 mEq/L (ref 19–32)
Chloride: 103 mEq/L (ref 96–112)
GFR calc non Af Amer: 86 mL/min — ABNORMAL LOW (ref >90)
Glucose, Bld: 89 mg/dL (ref 70–99)
Potassium: 3.9 mEq/L (ref 3.5–5.1)
Sodium: 138 mEq/L (ref 135–145)
Total Bilirubin: 0.2 mg/dL — ABNORMAL LOW (ref 0.3–1.2)

## 2012-01-12 LAB — CBC
Hemoglobin: 11.7 g/dL — ABNORMAL LOW (ref 12.0–15.0)
Platelets: 268 10*3/uL (ref 150–400)
RBC: 3.89 MIL/uL (ref 3.87–5.11)
WBC: 8 10*3/uL (ref 4.0–10.5)

## 2012-01-12 LAB — SURGICAL PCR SCREEN
MRSA, PCR: NEGATIVE
Staphylococcus aureus: NEGATIVE

## 2012-01-12 MED ORDER — CHLORHEXIDINE GLUCONATE 4 % EX LIQD: 1.0000 "application " | Freq: Once | CUTANEOUS | Status: DC

## 2012-01-12 NOTE — Progress Notes (Addendum)
Note from dr Graciela Husbands 5/13, echo 6/11, ekg 1/13, dr Shirlee Latch note 6/13 cath 4/11 medtronic rep called Richarda Osmond [phone]. Please call when faxed paper returned. Faxed 01/12/12 marcea called with results of fax to dr Graciela Husbands. Magnet req'd to be used . No interrogation needed afterward per dr Graciela Husbands

## 2012-01-12 NOTE — Pre-Procedure Instructions (Addendum)
20 Dorothy Li  01/12/2012   Your procedure is scheduled on:  01/19/12  Report to [facility] at 845 AM.  Call this number if you have problems the morning of surgery: [phone]   Remember:   Do not eat foodor drink:After Midnight.  .  Take these medicines the morning of surgery with A SIP OF WATER: apresoline, imdur STOP aspirin, nsaids, herbal meds, blood thinners today   Do not wear jewelry, make-up or nail polish.  Do not wear lotions, powders, or perfumes. You may wear deodorant.  Do not shave 48 hours prior to surgery. Men may shave face and neck.  Do not bring valuables to the hospital.  Contacts, dentures or bridgework may not be worn into surgery.  Leave suitcase in the car. After surgery it may be brought to your room.  For patients admitted to the hospital, checkout time is 11:00 AM the day of discharge.   Patients discharged the day of surgery will not be allowed to drive home.  Name and phone number of your driver:charles  [phone]   Special Instructions: CHG Shower Use Special Wash: 1/2 bottle night before surgery and 1/2 bottle morning of surgery.   Please read over the following fact sheets that you were given: Pain Booklet, Coughing and Deep Breathing, MRSA Information and Surgical Site Infection Prevention

## 2012-01-17 DIAGNOSIS — M84374A Stress fracture, right foot, initial encounter for fracture: Secondary | ICD-10-CM

## 2012-01-17 HISTORY — DX: Stress fracture, right foot, initial encounter for fracture: M84.374A

## 2012-01-18 MED ORDER — CIPROFLOXACIN IN D5W 400 MG/200ML IV SOLN
400.0000 mg | INTRAVENOUS | Status: AC
  Administered 2012-01-19: 400 mg via INTRAVENOUS
  Filled 2012-01-18: qty 200

## 2012-01-18 NOTE — Progress Notes (Signed)
I notified [id number] re: removing the EKG from 07/20/11 from this patient's chart, as the patient identifiers do not match.  Shonna Chock, PA-C

## 2012-01-18 NOTE — Consult Note (Signed)
Anesthesia Chart Review:  Dorothy Li is a 66 year old female scheduled for wire localized right lumpectomy and SN biopsy on 01/19/12 by Dr. Jamey Ripa.  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  PCP is Dr. Clelia Croft at [facility].  Her primary Cardiologist is Dr. Shirlee Latch.  Her EP Cardiologist is Dr. Graciela Husbands.  Dr. Shirlee Latch did not recommend any pre-operative testing prior to her lumpectomy, but feels she is higher risk due to her decreased EF.  (See Media tab.)    Her last EKG on 01/27/11 showed NSR, nonspecific T wave abnormality.  Echo on 12/29/09 showed: - Left ventricle: The cavity size was mildly dilated. Wall thickness was normal. Systolic function was severely reduced. The estimated ejection fraction was 25%. Severe global hypokinesis. Doppler parameters are consistent with abnormal left ventricular relaxation (grade 1 diastolic dysfunction). - Aortic valve: There was no stenosis. - Mitral valve: Mildly calcified annulus. No significant regurgitation. - Left atrium: The atrium was mildly dilated. - Right ventricle: The cavity size was normal. Systolic function was normal. - Pulmonary arteries: PA peak pressure: 30mm Hg (S). - Inferior vena cava: The vessel was normal in size; the respirophasic diameter changes were in the normal range (= 50%); findings are consistent with normal central venous pressure.  Her pre-CABG cath on 10/15/09 showed 70% left main, 60-70 % mid LAD, 60% DIAG1.  She is s/p LIMA to LAD and SVG to OM2 on 10/17/11.    CXR on 01/12/12 showed the lungs remain clear and somewhat hyperaerated. Cardiomegaly is stable and a permanent pacemaker remains with AICD lead. Median sternotomy sutures are noted. No bony abnormality is  seen.  Labs noted.  Plan to proceed if no new CV  symptoms.  Shonna Chock, PA-C

## 2012-01-19 ENCOUNTER — Encounter (HOSPITAL_COMMUNITY): Payer: Self-pay | Admitting: Surgery

## 2012-01-19 ENCOUNTER — Ambulatory Visit (HOSPITAL_COMMUNITY)
Admission: RE | Admit: 2012-01-19 | Discharge: 2012-01-19 | Disposition: A | Discharge status: Home / Self Care | Payer: Medicare Other | Source: Ambulatory Visit | Attending: Surgery | Admitting: Surgery

## 2012-01-19 ENCOUNTER — Ambulatory Visit
Admission: RE | Admit: 2012-01-19 | Discharge: 2012-01-19 | Disposition: A | Discharge status: Home / Self Care | Payer: Medicare Other | Source: Ambulatory Visit | Attending: Surgery | Admitting: Surgery

## 2012-01-19 ENCOUNTER — Encounter (HOSPITAL_COMMUNITY): Payer: Self-pay | Admitting: Vascular Surgery

## 2012-01-19 ENCOUNTER — Encounter (HOSPITAL_COMMUNITY)
Admission: RE | Disposition: A | Discharge status: Home / Self Care | Payer: Self-pay | Source: Ambulatory Visit | Attending: Surgery

## 2012-01-19 ENCOUNTER — Ambulatory Visit (HOSPITAL_COMMUNITY)
Admission: RE | Admit: 2012-01-19 | Discharge: 2012-01-20 | Disposition: A | Discharge status: Home / Self Care | Payer: Medicare Other | Source: Ambulatory Visit | Attending: Surgery | Admitting: Surgery

## 2012-01-19 ENCOUNTER — Other Ambulatory Visit (HOSPITAL_COMMUNITY): Payer: Medicare Other

## 2012-01-19 ENCOUNTER — Ambulatory Visit (HOSPITAL_COMMUNITY): Payer: Medicare Other | Admitting: Vascular Surgery

## 2012-01-19 DIAGNOSIS — C50911 Malignant neoplasm of unspecified site of right female breast: Secondary | ICD-10-CM | POA: Diagnosis present

## 2012-01-19 DIAGNOSIS — I5032 Chronic diastolic (congestive) heart failure: Secondary | ICD-10-CM

## 2012-01-19 DIAGNOSIS — E785 Hyperlipidemia, unspecified: Secondary | ICD-10-CM | POA: Insufficient documentation

## 2012-01-19 DIAGNOSIS — I2510 Atherosclerotic heart disease of native coronary artery without angina pectoris: Secondary | ICD-10-CM | POA: Insufficient documentation

## 2012-01-19 DIAGNOSIS — I428 Other cardiomyopathies: Secondary | ICD-10-CM | POA: Insufficient documentation

## 2012-01-19 DIAGNOSIS — F1011 Alcohol abuse, in remission: Secondary | ICD-10-CM | POA: Insufficient documentation

## 2012-01-19 DIAGNOSIS — Z01818 Encounter for other preprocedural examination: Secondary | ICD-10-CM | POA: Insufficient documentation

## 2012-01-19 DIAGNOSIS — Z01812 Encounter for preprocedural laboratory examination: Secondary | ICD-10-CM | POA: Insufficient documentation

## 2012-01-19 DIAGNOSIS — I10 Essential (primary) hypertension: Secondary | ICD-10-CM | POA: Insufficient documentation

## 2012-01-19 DIAGNOSIS — I25810 Atherosclerosis of coronary artery bypass graft(s) without angina pectoris: Secondary | ICD-10-CM

## 2012-01-19 DIAGNOSIS — C50419 Malignant neoplasm of upper-outer quadrant of unspecified female breast: Secondary | ICD-10-CM | POA: Insufficient documentation

## 2012-01-19 DIAGNOSIS — D0590 Unspecified type of carcinoma in situ of unspecified breast: Secondary | ICD-10-CM

## 2012-01-19 DIAGNOSIS — I5022 Chronic systolic (congestive) heart failure: Secondary | ICD-10-CM

## 2012-01-19 HISTORY — PX: BREAST LUMPECTOMY: SHX2

## 2012-01-19 HISTORY — DX: Stress fracture, right foot, initial encounter for fracture: M84.374A

## 2012-01-19 SURGERY — BREAST LUMPECTOMY WITH NEEDLE LOCALIZATION AND AXILLARY SENTINEL LYMPH NODE BX
Anesthesia: General | Site: Breast | Laterality: Right | Wound class: Clean

## 2012-01-19 MED ORDER — HYDROCODONE-ACETAMINOPHEN 5-325 MG PO TABS
1.0000 | ORAL_TABLET | ORAL | Status: DC | PRN
  Administered 2012-01-20: 1 via ORAL
  Filled 2012-01-19: qty 1

## 2012-01-19 MED ORDER — SPIRONOLACTONE 25 MG PO TABS
25.0000 mg | ORAL_TABLET | Freq: Every day | ORAL | Status: DC
  Administered 2012-01-19 – 2012-01-20 (×2): 25 mg via ORAL
  Filled 2012-01-19 (×2): qty 1

## 2012-01-19 MED ORDER — PROPOFOL 10 MG/ML IV EMUL
INTRAVENOUS | Status: DC | PRN
  Administered 2012-01-19: 120 mg via INTRAVENOUS

## 2012-01-19 MED ORDER — FENTANYL CITRATE 0.05 MG/ML IJ SOLN
INTRAMUSCULAR | Status: AC
  Filled 2012-01-19: qty 2

## 2012-01-19 MED ORDER — DEXAMETHASONE SODIUM PHOSPHATE 4 MG/ML IJ SOLN
INTRAMUSCULAR | Status: DC | PRN
  Administered 2012-01-19: 8 mg via INTRAVENOUS

## 2012-01-19 MED ORDER — FENTANYL CITRATE 0.05 MG/ML IJ SOLN
50.0000 ug | INTRAMUSCULAR | Status: DC | PRN
  Administered 2012-01-19: 100 ug via INTRAVENOUS

## 2012-01-19 MED ORDER — TECHNETIUM TC 99M SULFUR COLLOID FILTERED
1.0000 | Freq: Once | INTRAVENOUS | Status: AC | PRN
  Administered 2012-01-19: 1 via INTRADERMAL

## 2012-01-19 MED ORDER — 0.9 % SODIUM CHLORIDE (POUR BTL) OPTIME
TOPICAL | Status: DC | PRN
  Administered 2012-01-19: 1000 mL

## 2012-01-19 MED ORDER — ACETAMINOPHEN 10 MG/ML IV SOLN
INTRAVENOUS | Status: AC
  Filled 2012-01-19: qty 100

## 2012-01-19 MED ORDER — SODIUM CHLORIDE 0.9 % IV SOLN: 250.0000 mL | INTRAVENOUS | Status: DC | PRN

## 2012-01-19 MED ORDER — MIDAZOLAM HCL 2 MG/2ML IJ SOLN
INTRAMUSCULAR | Status: AC
  Filled 2012-01-19: qty 2

## 2012-01-19 MED ORDER — MORPHINE SULFATE 2 MG/ML IJ SOLN
2.0000 mg | INTRAMUSCULAR | Status: DC | PRN
  Administered 2012-01-19: 2 mg via INTRAVENOUS
  Filled 2012-01-19: qty 1

## 2012-01-19 MED ORDER — HYDRALAZINE HCL 25 MG PO TABS
37.5000 mg | ORAL_TABLET | Freq: Three times a day (TID) | ORAL | Status: DC
  Administered 2012-01-19 – 2012-01-20 (×3): 37.5 mg via ORAL
  Filled 2012-01-19 (×5): qty 1.5

## 2012-01-19 MED ORDER — LACTATED RINGERS IV SOLN
INTRAVENOUS | Status: DC
  Administered 2012-01-19 – 2012-01-20 (×2): via INTRAVENOUS

## 2012-01-19 MED ORDER — METHYLENE BLUE 1 % INJ SOLN
INTRAMUSCULAR | Status: AC
  Filled 2012-01-19: qty 10

## 2012-01-19 MED ORDER — MIDAZOLAM HCL 2 MG/2ML IJ SOLN
0.5000 mg | INTRAMUSCULAR | Status: DC | PRN
  Administered 2012-01-19: 2 mg via INTRAVENOUS

## 2012-01-19 MED ORDER — SODIUM CHLORIDE 0.9 % IJ SOLN
INTRAMUSCULAR | Status: DC | PRN
  Administered 2012-01-19: 3 mL

## 2012-01-19 MED ORDER — CARVEDILOL 12.5 MG PO TABS
12.5000 mg | ORAL_TABLET | Freq: Two times a day (BID) | ORAL | Status: DC
  Administered 2012-01-19 – 2012-01-20 (×2): 12.5 mg via ORAL
  Filled 2012-01-19 (×4): qty 1

## 2012-01-19 MED ORDER — FENTANYL CITRATE 0.05 MG/ML IJ SOLN
INTRAMUSCULAR | Status: DC | PRN
  Administered 2012-01-19: 100 ug via INTRAVENOUS
  Administered 2012-01-19: 25 ug via INTRAVENOUS

## 2012-01-19 MED ORDER — ONDANSETRON HCL 4 MG/2ML IJ SOLN
INTRAMUSCULAR | Status: DC | PRN
  Administered 2012-01-19: 4 mg via INTRAVENOUS

## 2012-01-19 MED ORDER — ISOSORBIDE MONONITRATE ER 60 MG PO TB24
60.0000 mg | ORAL_TABLET | Freq: Every day | ORAL | Status: DC
  Administered 2012-01-20: 60 mg via ORAL
  Filled 2012-01-19 (×2): qty 1

## 2012-01-19 MED ORDER — METHYLENE BLUE 1 % INJ SOLN
INTRAMUSCULAR | Status: DC | PRN
  Administered 2012-01-19: 2 mL

## 2012-01-19 MED ORDER — HYDROMORPHONE HCL PF 1 MG/ML IJ SOLN
0.2500 mg | INTRAMUSCULAR | Status: DC | PRN
  Administered 2012-01-19 (×2): 0.5 mg via INTRAVENOUS

## 2012-01-19 MED ORDER — ENALAPRIL MALEATE 2.5 MG PO TABS
2.5000 mg | ORAL_TABLET | Freq: Two times a day (BID) | ORAL | Status: DC
  Administered 2012-01-19 – 2012-01-20 (×3): 2.5 mg via ORAL
  Filled 2012-01-19 (×4): qty 1

## 2012-01-19 MED ORDER — ONDANSETRON HCL 4 MG/2ML IJ SOLN: 4.0000 mg | Freq: Four times a day (QID) | INTRAMUSCULAR | Status: DC | PRN

## 2012-01-19 MED ORDER — LACTATED RINGERS IV SOLN
INTRAVENOUS | Status: DC | PRN
  Administered 2012-01-19 (×2): via INTRAVENOUS

## 2012-01-19 MED ORDER — SODIUM CHLORIDE 0.9 % IJ SOLN: 3.0000 mL | Freq: Two times a day (BID) | INTRAMUSCULAR | Status: DC

## 2012-01-19 MED ORDER — LIDOCAINE HCL (CARDIAC) 20 MG/ML IV SOLN
INTRAVENOUS | Status: DC | PRN
  Administered 2012-01-19: 60 mg via INTRAVENOUS

## 2012-01-19 MED ORDER — BUPIVACAINE HCL (PF) 0.25 % IJ SOLN
INTRAMUSCULAR | Status: AC
  Filled 2012-01-19: qty 30

## 2012-01-19 MED ORDER — BUPIVACAINE HCL (PF) 0.25 % IJ SOLN
INTRAMUSCULAR | Status: DC | PRN
  Administered 2012-01-19: 30 mL

## 2012-01-19 MED ORDER — HYDROMORPHONE HCL PF 1 MG/ML IJ SOLN
INTRAMUSCULAR | Status: AC
  Administered 2012-01-19: 0.5 mg via INTRAVENOUS
  Filled 2012-01-19: qty 1

## 2012-01-19 MED ORDER — ACETAMINOPHEN 10 MG/ML IV SOLN
1000.0000 mg | Freq: Once | INTRAVENOUS | Status: AC
  Administered 2012-01-19: 1000 mg via INTRAVENOUS

## 2012-01-19 MED ORDER — SODIUM CHLORIDE 0.9 % IJ SOLN: 3.0000 mL | INTRAMUSCULAR | Status: DC | PRN

## 2012-01-19 MED ORDER — CARVEDILOL 12.5 MG PO TABS
12.5000 mg | ORAL_TABLET | ORAL | Status: AC
  Administered 2012-01-19: 12.5 mg via ORAL
  Filled 2012-01-19 (×2): qty 1

## 2012-01-19 SURGICAL SUPPLY — 61 items
ADH SKN CLS APL DERMABOND .7 (GAUZE/BANDAGES/DRESSINGS)
ADH SKN CLS LQ APL DERMABOND (GAUZE/BANDAGES/DRESSINGS) ×1
APPLIER CLIP 9.375 MED OPEN (MISCELLANEOUS)
APPLIER CLIP 9.375 SM OPEN (CLIP) ×2
APR CLP MED 9.3 20 MLT OPN (MISCELLANEOUS)
APR CLP SM 9.3 20 MLT OPN (CLIP) ×1
BINDER BREAST LRG (GAUZE/BANDAGES/DRESSINGS) IMPLANT
BINDER BREAST XLRG (GAUZE/BANDAGES/DRESSINGS) ×1 IMPLANT
BLADE SURG 10 STRL SS (BLADE) ×2 IMPLANT
BLADE SURG 15 STRL LF DISP TIS (BLADE) ×1 IMPLANT
BLADE SURG 15 STRL SS (BLADE) ×2
CHLORAPREP W/TINT 26ML (MISCELLANEOUS) ×2 IMPLANT
CLIP APPLIE 9.375 MED OPEN (MISCELLANEOUS) IMPLANT
CLIP APPLIE 9.375 SM OPEN (CLIP) IMPLANT
CLOTH BEACON ORANGE TIMEOUT ST (SAFETY) ×2 IMPLANT
CONT SPEC 4OZ CLIKSEAL STRL BL (MISCELLANEOUS) ×6 IMPLANT
COVER PROBE W GEL 5X96 (DRAPES) ×2 IMPLANT
COVER SURGICAL LIGHT HANDLE (MISCELLANEOUS) ×2 IMPLANT
DECANTER SPIKE VIAL GLASS SM (MISCELLANEOUS) ×2 IMPLANT
DERMABOND ADHESIVE PROPEN (GAUZE/BANDAGES/DRESSINGS) ×1
DERMABOND ADVANCED (GAUZE/BANDAGES/DRESSINGS)
DERMABOND ADVANCED .7 DNX12 (GAUZE/BANDAGES/DRESSINGS) ×1 IMPLANT
DERMABOND ADVANCED .7 DNX6 (GAUZE/BANDAGES/DRESSINGS) IMPLANT
DEVICE DUBIN SPECIMEN MAMMOGRA (MISCELLANEOUS) ×2 IMPLANT
DRAPE CHEST BREAST 15X10 FENES (DRAPES) ×2 IMPLANT
DRAPE UTILITY 15X26 W/TAPE STR (DRAPE) ×4 IMPLANT
ELECT CAUTERY BLADE 6.4 (BLADE) ×2 IMPLANT
ELECT REM PT RETURN 9FT ADLT (ELECTROSURGICAL) ×2
ELECTRODE REM PT RTRN 9FT ADLT (ELECTROSURGICAL) ×1 IMPLANT
GLOVE BIO SURGEON STRL SZ7 (GLOVE) ×1 IMPLANT
GLOVE BIOGEL PI IND STRL 7.0 (GLOVE) IMPLANT
GLOVE BIOGEL PI IND STRL 7.5 (GLOVE) IMPLANT
GLOVE BIOGEL PI INDICATOR 7.0 (GLOVE) ×1
GLOVE BIOGEL PI INDICATOR 7.5 (GLOVE) ×1
GLOVE ECLIPSE 7.0 STRL STRAW (GLOVE) ×1 IMPLANT
GLOVE EUDERMIC 7 POWDERFREE (GLOVE) ×2 IMPLANT
GOWN PREVENTION PLUS XLARGE (GOWN DISPOSABLE) ×2 IMPLANT
GOWN STRL NON-REIN LRG LVL3 (GOWN DISPOSABLE) ×3 IMPLANT
KIT BASIN OR (CUSTOM PROCEDURE TRAY) ×2 IMPLANT
KIT MARKER MARGIN INK (KITS) ×2 IMPLANT
KIT ROOM TURNOVER OR (KITS) ×2 IMPLANT
NDL 18GX1X1/2 (RX/OR ONLY) (NEEDLE) ×1 IMPLANT
NDL HYPO 25GX1X1/2 BEV (NEEDLE) ×2 IMPLANT
NEEDLE 18GX1X1/2 (RX/OR ONLY) (NEEDLE) ×2 IMPLANT
NEEDLE HYPO 25GX1X1/2 BEV (NEEDLE) ×4 IMPLANT
NS IRRIG 1000ML POUR BTL (IV SOLUTION) ×2 IMPLANT
PACK SURGICAL SETUP 50X90 (CUSTOM PROCEDURE TRAY) ×2 IMPLANT
PAD ARMBOARD 7.5X6 YLW CONV (MISCELLANEOUS) ×2 IMPLANT
PENCIL BUTTON HOLSTER BLD 10FT (ELECTRODE) ×2 IMPLANT
SPONGE LAP 18X18 X RAY DECT (DISPOSABLE) ×1 IMPLANT
SPONGE LAP 4X18 X RAY DECT (DISPOSABLE) ×3 IMPLANT
STAPLER VISISTAT 35W (STAPLE) ×1 IMPLANT
SUT MON AB 4-0 PC3 18 (SUTURE) ×2 IMPLANT
SUT SILK 3 0 (SUTURE)
SUT SILK 3-0 18XBRD TIE 12 (SUTURE) IMPLANT
SUT VIC AB 3-0 SH 18 (SUTURE) ×2 IMPLANT
SYR CONTROL 10ML LL (SYRINGE) ×4 IMPLANT
TOWEL OR 17X24 6PK STRL BLUE (TOWEL DISPOSABLE) ×2 IMPLANT
TOWEL OR 17X26 10 PK STRL BLUE (TOWEL DISPOSABLE) ×2 IMPLANT
TUBE CONNECTING 12X1/4 (SUCTIONS) ×2 IMPLANT
YANKAUER SUCT BULB TIP NO VENT (SUCTIONS) ×2 IMPLANT

## 2012-01-19 NOTE — Progress Notes (Signed)
Ok to give beta blocker per Dr. Sampson Goon with BP and heart rate.

## 2012-01-19 NOTE — Op Note (Signed)
Dorothy Li  01-Mar-1946  [id number]  01/19/2012   Preoperative diagnosis: Clinical stage I right breast cancer upper outer quadrant  Postoperative diagnosis: Same  Procedure: Wire localized excision right breast cancer with blue dye injection and axillary sentinel lymph node biopsy (2 nodes removed)  Surgeon: Currie Paris, MD, FACS  Anesthesia: General  Clinical History and Indications: this patient presents for a guidewire localized excision of a Right breast Cancer clinical stage I.  Description of procedure: The patient was seen in the holding area and the plans for the procedure reviewed. The Right breast was marked as the operative side. The wire localizing films were reviewed.  The patient was taken to the operating room and after satisfactory general anesthesia had been obtained the Right breast was prepped and draped and the timeout was performed.5 cc of dilute methylene blue was injected around the areola and massaged in.  The incision was made over the presumed area of the mass. Skin flaps were raised and using cautery the area was completely excised. Bleeders were controlled with either cautery or sutures as needed.Upon review of the specimen mammogram the clip appeared to be in the middle of the specimen. However I thought the margins might be a little small so I took additional margin tissue from superior medial and inferior. As I did this I also took additional deep margin as I was doing each of the 3 others.I then made sure everything was dry. I put 20 cc of 0.25% plain Marcaine in. Another irrigation checked for hemostasis was made. I put clips in the mark the margins of the lumpectomy cavity. After achieving hemostasis, the incision was closed with 3-0 Vicryl, 4-0 Monocryl subcuticular, and Dermabond.  Attention was then turned to the right axilla. The neoprobe identified a hot area. A transverse incision was made. The subcutaneous tissues were divided with the  cautery. The gastric fat pad was entered. A blue lymphatic was found but immediately to 2 blue lymph nodes both of which appeared to be hot. They're both excised. They both had counts of between three hundred and six a hundred. With these removed there was no other blue lymphatics or blue lymph nodes, no palpably abnormal lymph nodes, and her counts are back to baseline of around 0-5.  Additional local was placed using 10 cc of 0.25% plain Marcaine. The incision was closed in layers with 3-0 Vicryl, 40 model subcuticular, and Dermabond. The patient tolerated the procedure well. There were no operative complications. All counts were correct.   EBL: Minimal  Currie Paris, MD, FACS 01/19/2012 1:25 PM

## 2012-01-19 NOTE — H&P (Signed)
HPI  Dorothy Li is a 66 y.o. female. She presents with a newly diagnosed right breast cancer found as an abnormality on routine mammogram. She was asymptomatic, no prior breast problems or symptoms, FH + for some cousins with breast cancer. After evaluation she has elected to proceed to lyumpectomy and SLN HPI  Past Medical History   Diagnosis  Date   .  HTN (hypertension)    .  ETOH abuse      Elevated AST, mildly elevated INR (? component of cirrhosis). Has now quit ETOH.   [name]  Hyperlipemia    .  Nephrolithiasis    .  Cardiomyopathy      Echo (4/11) with EF 25%, diffuse hypokinesis, moderate LV dilation, decreased RV systolic function but normal RV size, PA systolic pressure 35 mmHg. Lexiscan myoview (4/11): EF 29%, diffuse hypokinesis, basal anteroseptal and apical scar, no ischemia. The cardiomyopathy is likely due to a combination of CAD (LM stenosis) and ETOH abuse.   .  VT (ventricular tachycardia)      Dual chamber Medtronic ICD placed 7/11   .  CAD (coronary artery disease)      LHC (4/11) showed 75% ostial LM stenosis with significant damping upon catheter engagement, 60-70% mLAD, 60% ostial D1, EF 30%. Patient had CABG with LIMA-LAD and SVG-OM2.   .  Carotid stenosis      carotid dopplers (4/11) with 60-79% LICA stenosis. Carotid dopplers (10/11): 60-79% LICA stenosis.   .  Syncope    .  ARF (acute renal failure)      in setting dehydration with ACEI use   .  Cancer      right breast    Past Surgical History   Procedure  Date   .  Icd implantation      ICD-Medtronic Remote - No   .  Coronary artery bypass graft    .  Abdominal hysterectomy     Family History   Problem  Relation  Age of Onset   .  Heart disease  Sister    Social History  History   Substance Use Topics   .  Smoking status:  Former Smoker -- 1.0 packs/day for 40 years     Types:  Cigarettes     Quit date:  10/08/2009   .  Smokeless tobacco:  Not on file   .  Alcohol Use:  No    Allergies     Allergen  Reactions   .  Penicillins     Current Outpatient Prescriptions   Medication  Sig  Dispense  Refill   .  acetaminophen (TYLENOL) 325 MG tablet  Use two tablets as needed for shoulder pain     .  aspirin EC 81 MG tablet  Take 1 tablet (81 mg total) by mouth daily.     .  Calcium Carbonate-Vitamin D (CALCIUM 600-D) 600-400 MG-UNIT per tablet  Take by mouth daily.     .  carvedilol (COREG) 12.5 MG tablet  Take 1 tablet (12.5 mg total) by mouth 2 (two) times daily.  60 tablet  6   .  enalapril (VASOTEC) 2.5 MG tablet  Take 1 tablet (2.5 mg total) by mouth 2 (two) times daily.  60 tablet  6   .  furosemide (LASIX) 20 MG tablet  every other day. Take one every other day     .  hydrALAZINE (APRESOLINE) 25 MG tablet  Take 1 tablet (25 mg total) by mouth 3 (three) times  daily.  90 tablet  6   .  isosorbide mononitrate (IMDUR) 30 MG 24 hr tablet  Take 1 tablet (30 mg total) by mouth daily.  30 tablet  6   .  potassium chloride (KLOR-CON) 10 MEQ CR tablet  every other day. Take one every other day     .  rosuvastatin (CRESTOR) 40 MG tablet  Take 1 tablet (40 mg total) by mouth daily.  30 tablet  6   .  spironolactone (ALDACTONE) 25 MG tablet  Take 1 tablet (25 mg total) by mouth daily.  30 tablet  6   Review of Systems  Review of Systems  Respiratory: Positive for cough.  Skin: Positive for rash.  Rash at site of biopsy from tape irritation  Blood pressure 102/60, pulse 86, temperature 97.4 F (36.3 C), temperature source Temporal, resp. rate 16, height 5' 8.5" (1.74 m), weight 173 lb (78.472 kg).  Physical Exam  Physical Exam  Vitals reviewed.  Constitutional: She is oriented to person, place, and time. She appears well-developed and well-nourished. No distress.  HENT:  Head: Normocephalic and atraumatic.  Mouth/Throat: Oropharynx is clear and moist.  Eyes: Conjunctivae and EOM are normal. Pupils are equal, round, and reactive to light. No scleral icterus.  Neck: Normal range of  motion. Neck supple. No tracheal deviation present. No thyromegaly present.  Cardiovascular: Normal rate, regular rhythm, normal heart sounds and intact distal pulses. Exam reveals no gallop and no friction rub.  No murmur heard.  Pulmonary/Chest: Effort normal and breath sounds normal. No respiratory distress. She has no wheezes. She has no rales.   Breast No palpable mass, guuidewire in situ Scars from CABG and defibrillator. No breast mass or other abnormality on either side  Abdominal: Soft. Bowel sounds are normal. She exhibits no distension and no mass. There is no tenderness. There is no rebound and no guarding.  Musculoskeletal: Normal range of motion. She exhibits no edema and no tenderness.  Lymphadenopathy:  She has no axillary adenopathy.  Neurological: She is alert and oriented to person, place, and time.  Skin: Skin is warm and dry. No rash noted. She is not diaphoretic. No erythema.  Psychiatric: She has a normal mood and affect. Her behavior is normal. Judgment and thought content normal.  Data Reviewed  Mammogram and sono reports and path report  Assessment  Clinical Stahe I right breast cancer, UOQ, prognostic panel pending  CAD, S/P bypass  Defibrillator  Systolic Heart failure  Plan: Wire localized lumpectomy and SLN on right

## 2012-01-19 NOTE — Anesthesia Postprocedure Evaluation (Signed)
  Anesthesia Post-op Note  Patient: Dorothy Li  Procedure(s) Performed: Procedure(s) (LRB): BREAST LUMPECTOMY WITH NEEDLE LOCALIZATION AND AXILLARY SENTINEL LYMPH NODE BX (Right)  Patient Location: PACU  Anesthesia Type: General  Level of Consciousness: awake and alert   Airway and Oxygen Therapy: Patient Spontanous Breathing and Patient connected to nasal cannula oxygen  Post-op Pain: mild  Post-op Assessment: Post-op Vital signs reviewed, Patient's Cardiovascular Status Stable, Respiratory Function Stable, Patent Airway and No signs of Nausea or vomiting  Post-op Vital Signs: Reviewed and stable  Complications: No apparent anesthesia complications

## 2012-01-19 NOTE — Transfer of Care (Signed)
Immediate Anesthesia Transfer of Care Note  Patient: Dorothy Li  Procedure(s) Performed: Procedure(s) (LRB): BREAST LUMPECTOMY WITH NEEDLE LOCALIZATION AND AXILLARY SENTINEL LYMPH NODE BX (Right)  Patient Location: PACU  Anesthesia Type: General  Level of Consciousness: awake, alert  and oriented  Airway & Oxygen Therapy: Patient Spontanous Breathing and Patient connected to nasal cannula oxygen  Post-op Assessment: Report given to PACU RN, Post -op Vital signs reviewed and stable and Patient moving all extremities X 4  Post vital signs: Reviewed and stable  Complications: No apparent anesthesia complications

## 2012-01-19 NOTE — Anesthesia Preprocedure Evaluation (Signed)
Anesthesia Evaluation  Patient identified by MRN, date of birth, ID band Patient awake    Reviewed: Allergy & Precautions, H&P , NPO status , Patient's Chart, lab work & pertinent test results, reviewed documented beta blocker date and time   History of Anesthesia Complications (+) AWARENESS UNDER ANESTHESIA  Airway Mallampati: II TM Distance: >3 FB Neck ROM: Full    Dental No notable dental hx. (+) Teeth Intact and Dental Advisory Given   Pulmonary former smoker,  breath sounds clear to auscultation  Pulmonary exam normal       Cardiovascular hypertension, On Home Beta Blockers + CAD and + CABG + dysrhythmias Ventricular Tachycardia + Cardiac Defibrillator Rhythm:Regular Rate:Normal     Neuro/Psych negative neurological ROS  negative psych ROS   GI/Hepatic negative GI ROS, Neg liver ROS,   Endo/Other  negative endocrine ROS  Renal/GU negative Renal ROS  negative genitourinary   Musculoskeletal   Abdominal   Peds  Hematology negative hematology ROS (+)   Anesthesia Other Findings   Reproductive/Obstetrics negative OB ROS                           Anesthesia Physical  Anesthesia Plan  ASA: III  Anesthesia Plan: General   Post-op Pain Management:    Induction: Intravenous  Airway Management Planned: LMA  Additional Equipment:   Intra-op Plan:   Post-operative Plan: Extubation in OR  Informed Consent: I have reviewed the patients History and Physical, chart, labs and discussed the procedure including the risks, benefits and alternatives for the proposed anesthesia with the patient or authorized representative who has indicated his/her understanding and acceptance.   Dental advisory given  Plan Discussed with: CRNA  Anesthesia Plan Comments:         Anesthesia Quick Evaluation  

## 2012-01-20 ENCOUNTER — Encounter (HOSPITAL_COMMUNITY): Payer: Self-pay | Admitting: *Deleted

## 2012-01-20 DIAGNOSIS — I10 Essential (primary) hypertension: Secondary | ICD-10-CM | POA: Insufficient documentation

## 2012-01-20 DIAGNOSIS — Z951 Presence of aortocoronary bypass graft: Secondary | ICD-10-CM | POA: Insufficient documentation

## 2012-01-20 DIAGNOSIS — E785 Hyperlipidemia, unspecified: Secondary | ICD-10-CM | POA: Insufficient documentation

## 2012-01-20 DIAGNOSIS — I2510 Atherosclerotic heart disease of native coronary artery without angina pectoris: Secondary | ICD-10-CM | POA: Insufficient documentation

## 2012-01-20 DIAGNOSIS — Y838 Other surgical procedures as the cause of abnormal reaction of the patient, or of later complication, without mention of misadventure at the time of the procedure: Secondary | ICD-10-CM | POA: Insufficient documentation

## 2012-01-20 DIAGNOSIS — C50919 Malignant neoplasm of unspecified site of unspecified female breast: Secondary | ICD-10-CM | POA: Insufficient documentation

## 2012-01-20 DIAGNOSIS — Z87891 Personal history of nicotine dependence: Secondary | ICD-10-CM | POA: Insufficient documentation

## 2012-01-20 DIAGNOSIS — Z880 Allergy status to penicillin: Secondary | ICD-10-CM | POA: Insufficient documentation

## 2012-01-20 DIAGNOSIS — T8131XA Disruption of external operation (surgical) wound, not elsewhere classified, initial encounter: Secondary | ICD-10-CM | POA: Insufficient documentation

## 2012-01-20 MED ORDER — HYDROCODONE-ACETAMINOPHEN 5-325 MG PO TABS: 1.0000 | ORAL_TABLET | ORAL | Status: DC | PRN

## 2012-01-20 NOTE — Progress Notes (Signed)
1 Day Post-Op  Subjective: Feels good and able to go home. Minimal pain  Objective: Vital signs in last 24 hours: Temp:  [97.3 F (36.3 C)-98.5 F (36.9 C)] 98.4 F (36.9 C) (07/18 0547) Pulse Rate:  [67-84] 71  (07/18 0547) Resp:  [10-20] 20  (07/18 0547) BP: (99-138)/(56-77) 108/59 mmHg (07/18 0547) SpO2:  [98 %-100 %] 100 % (07/18 0547)   Intake/Output from previous day: 07/17 0701 - 07/18 0700 In: 1000 [I.V.:1000] Out: 1550 [Urine:1550] Intake/Output this shift:     General appearance: alert, cooperative and no distress Resp: clear to auscultation bilaterally  Incision: healing well, no significant drainage  Lab Results:  No results found for this basename: WBC:2,HGB:2,HCT:2,PLT:2 in the last 72 hours BMET No results found for this basename: NA:2,K:2,CL:2,CO2:2,GLUCOSE:2,BUN:2,CREATININE:2,CALCIUM:2 in the last 72 hours PT/INR No results found for this basename: LABPROT:2,INR:2 in the last 72 hours ABG No results found for this basename: PHART:2,PCO2:2,PO2:2,HCO3:2 in the last 72 hours  MEDS, Scheduled    . acetaminophen  1,000 mg Intravenous Once  . carvedilol  12.5 mg Oral To 282  . carvedilol  12.5 mg Oral BID  . ciprofloxacin  400 mg Intravenous 120 min pre-op  . enalapril  2.5 mg Oral BID  . hydrALAZINE  37.5 mg Oral TID  . isosorbide mononitrate  60 mg Oral Daily  . sodium chloride  3 mL Intravenous Q12H  . spironolactone  25 mg Oral Daily    Studies/Results: Nm Sentinel Node Inj-no Rpt (breast)  01/19/2012  CLINICAL DATA: Right breast cancer   Sulfur colloid was injected intradermally by the nuclear medicine  technologist for breast cancer sentinel node localization.     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  I met with the patient and we discussed the procedure of needle localization including risks.  Specifically, we discussed the risks of bleeding and infection.  Informed written consent was given.  Using mammographic guidance, sterile technique, local anesthesia and a 11 cm localization needle, the recently biopsied mass and biopsy marker clip were localized using lateral approach.  The images were marked for Dr. Jamey Ripa.  Specimen radiograph was performed at the [facility] main operating room, and demonstrates the mass and localization clip present in the tissue sample.  The wire tip is not visualized and reportedly fell out during surgery.  The specimen was marked for pathology.  IMPRESSION: Needle localization right breast.  No apparent complications.  Original Report Authenticated By: Darrol Angel, M.D.   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  I met with the patient and we discussed the procedure of needle localization including risks.  Specifically, we discussed the risks of bleeding and infection.  Informed written consent was given.  Using mammographic guidance, sterile technique, local anesthesia and a 11 cm localization needle, the recently biopsied mass and biopsy marker clip were localized using lateral approach.  The images were marked for Dr. Jamey Ripa.  Specimen radiograph was performed at the [facility] main operating room, and demonstrates the mass and localization clip present in the tissue sample.  The wire tip is not visualized and reportedly fell out during surgery.  The specimen was marked for pathology.  IMPRESSION: Needle localization right breast.  No apparent complications.  Original Report Authenticated By: Darrol Angel,  M.D.    Assessment: s/p Procedure(s): BREAST LUMPECTOMY WITH NEEDLE LOCALIZATION AND AXILLARY SENTINEL LYMPH NODE BX Ready to go home  Plan: Discharge   LOS: 1 day     Currie Paris, MD, [facility], [place] [phone]   01/20/2012 7:06 AM

## 2012-01-20 NOTE — ED Notes (Signed)
Patient had a lumpectomy today and short stay and was sent home today.  She noticed that her incision on her left breast has opened up and oozing blood.  Incision was closed with dermabond.  No active bleeding at this time

## 2012-01-20 NOTE — Progress Notes (Signed)
D/c instructions reviewed with pt and husband. Education provided, both verbalized understanding of all instructions. Copy of instructions and script given to pt. Pt will d/c via wheelchair with belongings when dressed and volunteer arrives with wheelchair.

## 2012-01-21 ENCOUNTER — Telehealth (INDEPENDENT_AMBULATORY_CARE_PROVIDER_SITE_OTHER): Payer: Self-pay

## 2012-01-21 ENCOUNTER — Emergency Department (HOSPITAL_COMMUNITY)
Admission: EM | Admit: 2012-01-21 | Discharge: 2012-01-21 | Disposition: A | Discharge status: Home / Self Care | Payer: Medicare Other | Attending: Emergency Medicine | Admitting: Emergency Medicine

## 2012-01-21 DIAGNOSIS — T8130XA Disruption of wound, unspecified, initial encounter: Secondary | ICD-10-CM

## 2012-01-21 NOTE — ED Provider Notes (Signed)
History     CSN: [id number]  Arrival date & time 01/20/12  2227   None     Chief Complaint  Patient presents with  . Post-op Problem    (Consider location/radiation/quality/duration/timing/severity/associated sxs/prior treatment) HPI Comments: Patient breast biopsy, and lumpectomy.  Yesterday Dermabond was placed, but she noticed earlier in the evening at the wound has started to open and bleed.  She states she was instructed by her surgeon to followup with him in 2 weeks, and if she is any problem before that to come to the emergency department for treatment  The history is provided by the patient.    Past Medical History  Diagnosis Date  . HTN (hypertension)   . ETOH abuse      Elevated AST, mildly elevated INR (? component of cirrhosis). Has now quit ETOH.   [name] Hyperlipemia   . Cardiomyopathy     Echo (4/11) with EF 25%, diffuse hypokinesis, moderate LV dilation, decreased RV  systolic function but normal RV size, PA systolic pressure 35 mmHg. Lexiscan myoview (4/11): EF 29%,  diffuse hypokinesis, basal anteroseptal and apical scar, no ischemia.  The cardiomyopathy is likely due to a combination of CAD (LM stenosis) and ETOH abuse.   . VT (ventricular tachycardia)     Dual chamber Medtronic ICD placed 7/11  . CAD (coronary artery disease)     LHC (4/11) showed 75% ostial LM stenosis with significant damping upon catheter engagement, 60-70% mLAD, 60% ostial D1, EF 30%.  Patient had CABG with LIMA-LAD and SVG-OM2.   . Carotid stenosis     carotid dopplers (4/11) with 60-79% LICA stenosis.  Carotid dopplers (10/11): 60-79%  LICA stenosis.  . Syncope   . ICD (implantable cardiac defibrillator) in place Medtronic    Interrogated on 11/18/11  . Nephrolithiasis   . ARF (acute renal failure)     in setting dehydration with ACEI use   . Arthritis   . Cancer     right breast  . Stress fracture of right foot 01/17/12    Dr. Loralee Pacas    Past Surgical History  Procedure Date  . Icd  implantation     ICD-Medtronic    Remote - No /  Hx of VT  . Abdominal hysterectomy   . Breast biopsy 12/08/11    Right Breast, Upper Outer Quadrant: Invasive Ductal:DCIS  . Coronary artery bypass graft 4/11     x 2  . Cardiac catheterization     Family History  Problem Relation Age of Onset  . Heart disease Sister     Had surgery  . Breast cancer Cousin     History  Substance Use Topics  . Smoking status: Former Smoker -- 1.0 packs/day for 40 years    Types: Cigarettes    Quit date: 10/08/2009  . Smokeless tobacco: Not on file  . Alcohol Use: No     Prio hx 2 drinks daily.  quit 09/2009    OB History    Grav Para Term Preterm Abortions TAB SAB Ect Mult Living                  Review of Systems  Constitutional: Negative for fever and chills.  Skin: Positive for wound.    Allergies  Penicillins and Tape  Home Medications   Current Outpatient Rx  Name Route Sig Dispense Refill  . ASPIRIN EC 81 MG PO TBEC Oral Take 1 tablet (81 mg total) by mouth daily.    [name] CALCIUM  CARBONATE-VITAMIN D 600-400 MG-UNIT PO TABS Oral Take 1 tablet by mouth 2 (two) times daily.     [name] CARVEDILOL 12.5 MG PO TABS Oral Take 1 tablet (12.5 mg total) by mouth 2 (two) times daily. 60 tablet 6  . ENALAPRIL MALEATE 2.5 MG PO TABS Oral Take 1 tablet (2.5 mg total) by mouth 2 (two) times daily. 60 tablet 6  . HYDRALAZINE HCL 25 MG PO TABS Oral Take 1.5 tablets (37.5 mg total) by mouth 3 (three) times daily. 135 tablet 6  . HYDROCODONE-ACETAMINOPHEN 5-325 MG PO TABS Oral Take 1 tablet by mouth every 4 (four) hours as needed. For pain    . ISOSORBIDE MONONITRATE ER 60 MG PO TB24 Oral Take 1 tablet (60 mg total) by mouth daily. 30 tablet 6  . ROSUVASTATIN CALCIUM 40 MG PO TABS Oral Take 1 tablet (40 mg total) by mouth daily. 90 tablet 1  . SPIRONOLACTONE 25 MG PO TABS Oral Take 1 tablet (25 mg total) by mouth daily. 30 tablet 6    BP 123/74  Pulse 74  Temp 97.6 F (36.4 C) (Oral)  Resp 16  SpO2  97%  Physical Exam  Constitutional: She appears well-developed and well-nourished.  HENT:  Head: Normocephalic.  Eyes: Pupils are equal, round, and reactive to light.  Neck: Normal range of motion.  Cardiovascular: Normal rate.   Pulmonary/Chest: Effort normal.  Skin:       Surgical wound on the lateral aspect of the right breast with wound dehiscence.  Small amount of bleeding noted on gauze.  No erythema or purulent discharge    ED Course  Procedures (including critical care time)  Labs Reviewed - No data to display Nm Sentinel Node Inj-no Rpt (breast)  01/19/2012  CLINICAL DATA: Right breast cancer   Sulfur colloid was injected intradermally by the nuclear medicine  technologist for breast cancer sentinel node localization.     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  I met with the patient and we discussed the procedure of needle localization including risks.  Specifically, we discussed the risks of bleeding and infection.  Informed written consent was given.  Using mammographic guidance, sterile technique, local anesthesia and a 11 cm localization needle, the recently biopsied mass and biopsy marker clip were localized using lateral approach.  The images were marked for Dr. Jamey Ripa.  Specimen radiograph was performed at the [facility] main operating room, and demonstrates the mass and localization clip present in the tissue sample.  The wire tip is not visualized and reportedly fell out during surgery.  The specimen was marked for pathology.  IMPRESSION: Needle localization right breast.  No apparent complications.  Original Report Authenticated By: Darrol Angel, M.D.   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  I met with the patient and we discussed the procedure of needle localization including risks.  Specifically, we discussed the risks of bleeding and infection.  Informed written consent was given.  Using mammographic guidance, sterile technique, local anesthesia and a 11 cm localization needle, the recently biopsied mass and  biopsy marker clip were localized using lateral approach.  The images were marked for Dr. Jamey Ripa.  Specimen radiograph was performed at the [facility] main operating room, and demonstrates the mass and localization clip present in the tissue sample.  The wire tip is not visualized and reportedly fell out during surgery.  The specimen was marked for pathology.  IMPRESSION: Needle localization right breast.  No apparent complications.  Original Report Authenticated By: Darrol Angel, M.D.     1. Wound dehiscence       MDM   Dr. Verl Bangs to bedside for evaluation he placed steri strips I then instructed patient to followup with her surgeon first thing in the morning        Arman Filter, NP 01/21/12 1610  Arman Filter, NP 01/21/12 [phone]

## 2012-01-21 NOTE — ED Notes (Signed)
No rx given, pt voiced understanding to f/u with surgeon in the morning and return for worsening in condition.

## 2012-01-21 NOTE — Telephone Encounter (Signed)
The patient had her breast bx on 07/17.  She had to go the ER with wound dehiscence.  They put steri strips on and right now it looks ok.  She is asking if she should be seen sooner.  She has an appointment 7/30.  Do you want to work her in next week?  She is seeing Dr Farris Has Monday 7/22 at 0845 and Dr Welton Flakes Tuesday 7/23 at 0900.  You can call her late morning or early afternoon.

## 2012-01-24 NOTE — Telephone Encounter (Signed)
Do you want to see her sooner?

## 2012-01-24 NOTE — Telephone Encounter (Signed)
Patient called and offered appt for today at 2:00pm with Dr Jamey Ripa. She can not make it because her husband has an appt this pm. She has appt with Dr Welton Flakes tomorrow. Per Dr Jamey Ripa she can be checked by Dr Welton Flakes and if she feels he needs to see it prior to her follow up with him she can let us know. Patient okay with this plan.

## 2012-01-25 ENCOUNTER — Telehealth: Payer: Self-pay | Admitting: *Deleted

## 2012-01-25 ENCOUNTER — Encounter: Payer: Self-pay | Admitting: Oncology

## 2012-01-25 ENCOUNTER — Ambulatory Visit (HOSPITAL_BASED_OUTPATIENT_CLINIC_OR_DEPARTMENT_OTHER): Payer: Medicare Other | Admitting: Oncology

## 2012-01-25 VITALS — BP 97/63 | HR 98 | Temp 98.7°F | Ht 68.0 in | Wt 172.2 lb

## 2012-01-25 DIAGNOSIS — C50919 Malignant neoplasm of unspecified site of unspecified female breast: Secondary | ICD-10-CM

## 2012-01-25 DIAGNOSIS — C50911 Malignant neoplasm of unspecified site of right female breast: Secondary | ICD-10-CM

## 2012-01-25 NOTE — Telephone Encounter (Signed)
Gave patient appointment for 02-09-2012 with dr. Basilio Cairo starting at 10:30am gave patient appointment for 03-09-2012 starting at 9:30am with dr.khan printed out calendar and gave to the patient

## 2012-01-25 NOTE — Patient Instructions (Addendum)
1. Refer to Radiation Oncology for post-lumpectomy Radiation  2. I will see you back in 2 months for follow up

## 2012-01-25 NOTE — Progress Notes (Signed)
OFFICE PROGRESS NOTE  CC  [name],[name], MD [address] [place] [address] Dr. Lonie Peak Dr. 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  #2 patient is referred to radiation oncology for post lumpectomy radiation. Once she completes that she will receive adjuvant antiestrogen therapy consisting of Arimidex 1 mg daily. A total of 5 years of therapy is planned.  CURRENT THERAPY:Referral to radiation oncology  INTERVAL HISTORY: Dorothy Li 66 y.o. female returns for Followup visit today. She had her lumpectomy on the 17th of this month. Overall she tolerated it well without any significant complaints. Today she does have some local tenderness she also has noticed some serosanguineous fluid from the incision site. She had there is no redness. She otherwise denies any fevers chills night sweats headaches no shortness of breath chest pains no abdominal pain diarrhea or constipation no bruising remainder of the 10 point review of systems is unremarkable.  MEDICAL HISTORY: Past Medical History  Diagnosis Date  . HTN (hypertension)   . ETOH abuse      Elevated AST, mildly elevated INR (? component of cirrhosis). Has now quit ETOH.   [name] Hyperlipemia   . Cardiomyopathy     Echo (4/11) with EF 25%, diffuse hypokinesis, moderate LV dilation, decreased RV  systolic function but normal RV size, PA systolic pressure 35 mmHg. Lexiscan myoview (4/11): EF 29%,  diffuse hypokinesis, basal anteroseptal and apical scar, no ischemia.  The cardiomyopathy is likely due to a combination of CAD (LM stenosis) and ETOH abuse.   . VT  (ventricular tachycardia)     Dual chamber Medtronic ICD placed 7/11  . CAD (coronary artery disease)     LHC (4/11) showed 75% ostial LM stenosis with significant damping upon catheter engagement, 60-70% mLAD, 60% ostial D1, EF 30%.  Patient had CABG with LIMA-LAD and SVG-OM2.   . Carotid stenosis     carotid dopplers (4/11) with 60-79% LICA stenosis.  Carotid dopplers (10/11): 60-79%  LICA stenosis.  . Syncope   . ICD (implantable cardiac defibrillator) in place Medtronic    Interrogated on 11/18/11  . Nephrolithiasis   . ARF (acute renal failure)     in setting dehydration with ACEI use   . Arthritis   . Cancer     right breast  . Stress fracture of right foot 01/17/12    Dr. Loralee Pacas    ALLERGIES:  is allergic to penicillins and tape.  MEDICATIONS:  Current Outpatient Prescriptions  Medication Sig Dispense Refill  . aspirin EC 81 MG tablet Take 1 tablet (81 mg total) by mouth daily.      . Calcium Carbonate-Vitamin D (CALCIUM 600-D) 600-400 MG-UNIT per tablet Take 1 tablet by mouth 2 (two) times daily.       . carvedilol (COREG) 12.5 MG tablet Take 1 tablet (12.5 mg total) by mouth 2 (two) times daily.  60 tablet  6  . enalapril (VASOTEC) 2.5 MG tablet Take 1 tablet (2.5 mg total) by mouth 2 (two) times daily.  60 tablet  6  . hydrALAZINE (APRESOLINE) 25 MG tablet Take 1.5 tablets (37.5 mg total) by mouth 3 (three) times daily.  135  tablet  6  . HYDROcodone-acetaminophen (NORCO/VICODIN) 5-325 MG per tablet Take 1 tablet by mouth every 4 (four) hours as needed. For pain      . isosorbide mononitrate (IMDUR) 60 MG 24 hr tablet Take 1 tablet (60 mg total) by mouth daily.  30 tablet  6  . rosuvastatin (CRESTOR) 40 MG tablet Take 1 tablet (40 mg total) by mouth daily.  90 tablet  1  . spironolactone (ALDACTONE) 25 MG tablet Take 1 tablet (25 mg total) by mouth daily.  30 tablet  6    SURGICAL HISTORY:  Past Surgical History  Procedure Date  . Icd implantation     ICD-Medtronic     Remote - No /  Hx of VT  . Abdominal hysterectomy   . Breast biopsy 12/08/11    Right Breast, Upper Outer Quadrant: Invasive Ductal:DCIS  . Coronary artery bypass graft 4/11     x 2  . Cardiac catheterization     REVIEW OF SYSTEMS:  Pertinent items are noted in HPI.   PHYSICAL EXAMINATION: General appearance: alert, cooperative and appears stated age Resp: clear to auscultation bilaterally and normal percussion bilaterally Cardio: regular rate and rhythm, S1, S2 normal, no murmur, click, rub or gallop GI: soft, non-tender; bowel sounds normal; no masses,  no organomegaly Extremities: extremities normal, atraumatic, no cyanosis or edema Neurologic: Grossly normal Right breast: Incisional scar appears to be healing adequately presently there is no discharge minimally tender the right breast is slightly more swollen than the left breast.. ECOG PERFORMANCE STATUS: 1 - Symptomatic but completely ambulatory  Blood pressure 97/63, pulse 98, temperature 98.7 F (37.1 C), temperature source Oral, height 5\' 8"  (1.727 m), weight 172 lb 3.2 oz (78.109 kg).  LABORATORY DATA: Lab Results  Component Value Date   WBC 8.0 01/12/2012   HGB 11.7* 01/12/2012   HCT 35.9* 01/12/2012   MCV 92.3 01/12/2012   PLT 268 01/12/2012      Chemistry      Component Value Date/Time   NA 138 01/12/2012 0842   K 3.9 01/12/2012 0842   CL 103 01/12/2012 0842   CO2 23 01/12/2012 0842   BUN 20 01/12/2012 0842   CREATININE 0.78 01/12/2012 0842      Component Value Date/Time   CALCIUM 10.2 01/12/2012 0842   ALKPHOS 76 01/12/2012 0842   AST 14 01/12/2012 0842   ALT 11 01/12/2012 0842   BILITOT 0.2* 01/12/2012 0842     FINAL DIAGNOSIS Diagnosis 1. Breast, lumpectomy, Right - WELL DIFFERENTIATED INVASIVE DUCTAL CARCINOMA, GRADE I (1.0 CM). - INVASIVE TUMOR IS FOCALLY PRESENT AT ANTERIOR MARGIN. - NO LYMPHOVASCULAR INVASION IDENTIFIED. - DUCTAL CARCINOMA IN SITU, GRADE I-II WITH COMEDO NECROSIS. - SEE TUMOR SYNOPTIC  TEMPLATE BELOW. 2. Breast, excision, additional superior margin, right - BENIGN BREAST PARENCHYMA SEE COMMENT. - NO ATYPIA OR MALIGNANCY PRESENT. 3. Breast, excision, additional inferior margin, right - BENIGN BREAST WITH FIBROCYSTIC CHANGE AND USUAL DUCTAL HYPERPLASIA SEE COMMENT. - MICROCALCIFICATIONS PRESENT IN BENIGN DUCTS AND LOBULES. - NO ATYPIA OR MALIGNANCY PRESENT. 4. Breast, excision, additional medial margin, right - BENIGN BREAST PARENCHYMA WITH FIBROCYSTIC CHANGE AND USUAL DUCT HYPERPLASIA. SEE COMMENT. - NO ATYPIA OR MALIGNANCY PRESENT. 5. Lymph node, sentinel, biopsy, Right axillary#1 - ONE LYMPH NODE, NEGATIVE FOR TUMOR (0/1). 6. Lymph node, sentinel, biopsy, Right axillary #2 - ONE LYMPH NODE, NEGATIVE FOR TUMOR (0/1). Microscopic Comment 1. BREAST, INVASIVE TUMOR, WITH LYMPH NODE SAMPLING Specimen, including laterality: Right breast. Procedure: Lumpectomy. Grade: I of  III Tubule formation: 1 Nuclear pleomorphism: 1 Mitotic:1 1 of 4 FINAL for Dorothy Li, Dorothy Li [id number]) Microscopic Comment(continued) Tumor size (gross measurement): 1.0 cm Margins: Invasive, distance to closest margin: anterior margin. In-situ, distance to closest margin: less than 0.1 mm (anterior) If margin positive, focally or broadly: Focally Lymphovascular invasion: Absent. Ductal carcinoma in situ: Present. Grade: I-II of III Extensive intraductal component: Absent. Lobular neoplasia: Absent. Tumor focality: Unifocal Treatment effect: None If present, treatment effect in breast tissue, lymph nodes or both: N/A Extent of tumor: Skin: N/A Nipple: N/A Skeletal muscle: N/A Lymph nodes: # examined: 2 Lymph nodes with metastasis: 0 Breast prognostic profile: Estrogen receptor: Not repeated, previous study demonstrated 100% positivity ([id number]) Progesterone receptor: Not repeated, previous study demonstrated 98% positivity ([id number]) Her 2 neu: Repeated, previous study  demonstrated no amplification ([id number]) Ki-67: Not repeated, previous study demonstrated 11% proliferation rate ([id number]) Non-neoplastic breast: Intraductal papilloma, sclerosing adenosis with calcifications, and previous biopsy site. TNM: pT1b, pN0, pMX (CRR:gt, 01/21/12) 2. The surgical resection margin(s) of the specimen were inked and microscopically evaluated. 3. The surgical resection margin(s) of the specimen were inked and microscopically evaluated. 4. The surgical resection margin(s) of the specimen were inked and microscopically evaluated.  RADIOGRAPHIC STUDIES:  Chest 2 View  01/12/2012  *RADIOLOGY REPORT*  Clinical Data: The right breast carcinoma, rule lumpectomy and sentinel node  CHEST - 2 VIEW  Comparison: Portable chest x-ray of 01/26/2011  Findings: The lungs remain clear and somewhat hyperaerated. Cardiomegaly is stable and a permanent pacemaker remains with AICD lead.  Median sternotomy sutures are noted.  No bony abnormality is seen.  IMPRESSION: No active lung disease.  No change in cardiomegaly with permanent pacemaker.  Original Report Authenticated By: Juline Patch, M.D.   Nm Sentinel Node Inj-no Rpt (breast)  01/19/2012  CLINICAL DATA: Right breast cancer   Sulfur colloid was injected intradermally by the nuclear medicine  technologist for breast cancer sentinel node localization.     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  I met with the patient and we discussed the procedure of needle localization including risks.  Specifically, we discussed the risks of bleeding and infection.  Informed written consent was given.  Using mammographic guidance, sterile technique, local anesthesia and a 11 cm localization needle, the  recently biopsied mass and biopsy marker clip were localized using lateral approach.  The images were marked for Dr. Jamey Ripa.  Specimen radiograph was performed at the [facility] main operating room, and demonstrates the mass and localization clip present in the tissue sample.  The wire tip is not visualized and reportedly fell out during surgery.  The specimen was marked for pathology.  IMPRESSION: Needle localization right breast.  No apparent complications.  Original Report Authenticated By: Darrol Angel, M.D.   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  I met with the patient and we discussed the procedure of needle localization including risks.  Specifically, we discussed the risks of bleeding and infection.  Informed written consent was given.  Using mammographic guidance, sterile technique, local anesthesia and a  11 cm localization needle, the recently biopsied mass and biopsy marker clip were localized using lateral approach.  The images were marked for Dr. Jamey Ripa.  Specimen radiograph was performed at the [facility] main operating room, and demonstrates the mass and localization clip present in the tissue sample.  The wire tip is not visualized and reportedly fell out during surgery.  The specimen was marked for pathology.  IMPRESSION: Needle localization right breast.  No apparent complications.  Original Report Authenticated By: Darrol Angel, M.D.    ASSESSMENT: 66 year old female with:  #1 new diagnosis of stage I well-differentiated invasive ductal carcinoma of the right breast status post right lumpectomy with septal node biopsy. The final pathology revealed a 1.0 cm invasive ductal carcinoma ER +100% PR +98% HER-2/neu negative Ki-67 11% 2  sentinel nodes were negative for metastatic disease. Postoperatively patient is doing well. Patient's overall prognosis is extremely excellent.   PLAN:   #1 we will refer the patient to radiation oncology for post lumpectomy radiation. Number  #2 after completion of radiation therapy she will receive adjuvant antiestrogen therapy with Arimidex 1 mg daily. Patient understands the risks and benefits of Arimidex as we have discussed these on her previous visit and she was given literature.Patient will not gain any benefit from addition of adjuvant chemotherapy based on her prognostic factors.  #3 I will plan on seeing her back in 2 months time for followup   All questions were answered. The patient knows to call the clinic with any problems, questions or concerns. We can certainly see the patient much sooner if necessary.  I spent 25 minutes counseling the patient face to face. The total time spent in the appointment was 30 minutes.    Drue Second, MD Medical/Oncology [facility] [phone] (beeper) [phone] (Office)  01/25/2012, 9:14 AM

## 2012-02-01 ENCOUNTER — Encounter (INDEPENDENT_AMBULATORY_CARE_PROVIDER_SITE_OTHER): Payer: Self-pay | Admitting: Surgery

## 2012-02-01 ENCOUNTER — Ambulatory Visit (INDEPENDENT_AMBULATORY_CARE_PROVIDER_SITE_OTHER): Payer: Medicare Other | Admitting: Surgery

## 2012-02-01 VITALS — BP 106/56 | HR 80 | Temp 98.1°F | Resp 16 | Ht 68.5 in | Wt 173.4 lb

## 2012-02-01 DIAGNOSIS — Z09 Encounter for follow-up examination after completed treatment for conditions other than malignant neoplasm: Secondary | ICD-10-CM

## 2012-02-01 NOTE — Progress Notes (Signed)
GARIMA CHRONIS    [id number] 02/01/2012    September 24, 1945   CC: Post op Right wire localized lumpectomy and sentinel node evaluation  HPI: The patient returns for post op follow-up. She underwent a Lumpectomy with sentinel node.. Over all she feels that she is doing well. Apparently she had some separation of the skin at the lumpectomy site which was Steri-Stripped in the ED.  PE: VITAL SIGNS: BP 106/56  Pulse 80  Temp 98.1 F (36.7 C) (Temporal)  Resp 16  Ht 5' 8.5" (1.74 m)  Wt 173 lb 6.4 oz (78.654 kg)  BMI 25.98 kg/m2  The Incisions are healing nicely and there is no evidence of infection or hematoma.   DATA REVIEWED: Pathology report showed 1 cm grade 1 invasive ductal carcinoma receptor positive. Unfortunately, the anterior margin was focally positive for invasive ductal carcinoma. All the other margins were widely negative.  IMPRESSION: Patient doing well. Focally involved anterior margin  PLAN: Her next visit will be in 3 months for long-term followup. However we will review her situation with the medical and radiation oncologist. She will may well benefit from a reexcision of the skin and subcutaneous tissue to achieve a better anterior margin. However she may also possibly just be treated with radiation followed by antiestrogen. I reviewed this with the patient.[name]

## 2012-02-01 NOTE — Patient Instructions (Signed)
I will discuss the issue about the margin of the tumor with the radiation oncologist, Dr. Basilio Cairo. We may need to remove a little bit more tissue from the lumpectomy area.

## 2012-02-02 ENCOUNTER — Other Ambulatory Visit: Payer: Self-pay | Admitting: Cardiology

## 2012-02-02 ENCOUNTER — Encounter: Payer: Self-pay | Admitting: Oncology

## 2012-02-02 ENCOUNTER — Encounter: Payer: Self-pay | Admitting: *Deleted

## 2012-02-02 DIAGNOSIS — I5032 Chronic diastolic (congestive) heart failure: Secondary | ICD-10-CM

## 2012-02-02 DIAGNOSIS — I25810 Atherosclerosis of coronary artery bypass graft(s) without angina pectoris: Secondary | ICD-10-CM

## 2012-02-03 ENCOUNTER — Other Ambulatory Visit: Payer: Self-pay | Admitting: Cardiology

## 2012-02-03 ENCOUNTER — Telehealth: Payer: Self-pay | Admitting: Cardiology

## 2012-02-03 DIAGNOSIS — I5032 Chronic diastolic (congestive) heart failure: Secondary | ICD-10-CM

## 2012-02-03 DIAGNOSIS — I25810 Atherosclerosis of coronary artery bypass graft(s) without angina pectoris: Secondary | ICD-10-CM

## 2012-02-03 MED ORDER — CARVEDILOL 12.5 MG PO TABS: 12.5000 mg | ORAL_TABLET | Freq: Two times a day (BID) | ORAL | Status: DC

## 2012-02-03 NOTE — Telephone Encounter (Signed)
Pt aware.

## 2012-02-08 ENCOUNTER — Encounter: Payer: Self-pay | Admitting: *Deleted

## 2012-02-09 ENCOUNTER — Ambulatory Visit
Admission: RE | Admit: 2012-02-09 | Discharge: 2012-02-09 | Disposition: A | Discharge status: Home / Self Care | Payer: Medicare Other | Source: Ambulatory Visit | Attending: Radiation Oncology | Admitting: Radiation Oncology

## 2012-02-09 ENCOUNTER — Telehealth: Payer: Self-pay | Admitting: *Deleted

## 2012-02-09 ENCOUNTER — Encounter: Payer: Self-pay | Admitting: Radiation Oncology

## 2012-02-09 VITALS — BP 103/67 | HR 78 | Temp 98.1°F | Resp 20 | Ht 68.5 in | Wt 173.0 lb

## 2012-02-09 DIAGNOSIS — C50911 Malignant neoplasm of unspecified site of right female breast: Secondary | ICD-10-CM

## 2012-02-09 DIAGNOSIS — C50419 Malignant neoplasm of upper-outer quadrant of unspecified female breast: Secondary | ICD-10-CM

## 2012-02-09 NOTE — Progress Notes (Signed)
Radiation Oncology         [phone] ________________________________  Name: Dorothy Li MRN: [id number]  Date: 02/09/2012  DOB: June 14, 1946  Follow-Up Visit Note  CC: Norberto Sorenson, MD  Victorino December, MD  Diagnosis:   Pathologic stage T1bN0 clinical M0 right breast cancer, grade 1 invasive ductal carcinoma, ER/PR positive HER-2/neu negative  Narrative:  The patient returns today for routine follow-up.  She underwent lumpectomy and sentinel lymph node biopsy on 01/19/2012. This revealed a 1.0 cm tumor with pathology as described above. She did have a positive anterior margin.          The patient met with medical oncology with eventual plans to start Arimidex. No plans for chemotherapy.         She is otherwise doing well in her usual state of health.             ALLERGIES:  is allergic to penicillins and tape.  Meds: Current Outpatient Prescriptions  Medication Sig Dispense Refill  . aspirin EC 81 MG tablet Take 1 tablet (81 mg total) by mouth daily.      . Calcium Carbonate-Vitamin D (CALCIUM 600-D) 600-400 MG-UNIT per tablet Take 1 tablet by mouth 2 (two) times daily.       . carvedilol (COREG) 12.5 MG tablet Take 1 tablet (12.5 mg total) by mouth 2 (two) times daily.  60 tablet  6  . enalapril (VASOTEC) 2.5 MG tablet Take 1 tablet (2.5 mg total) by mouth 2 (two) times daily.  60 tablet  6  . hydrALAZINE (APRESOLINE) 25 MG tablet Take 1.5 tablets (37.5 mg total) by mouth 3 (three) times daily.  135 tablet  6  . HYDROcodone-acetaminophen (NORCO/VICODIN) 5-325 MG per tablet Take 1 tablet by mouth every 4 (four) hours as needed. For pain      . isosorbide mononitrate (IMDUR) 60 MG 24 hr tablet Take 1 tablet (60 mg total) by mouth daily.  30 tablet  6  . rosuvastatin (CRESTOR) 40 MG tablet Take 1 tablet (40 mg total) by mouth daily.  90 tablet  1  . spironolactone (ALDACTONE) 25 MG tablet Take 1 tablet (25 mg total) by mouth daily.  30 tablet  6    Physical Findings: The patient is  in no acute distress. Patient is alert and oriented.  height is 5' 8.5" (1.74 m) and weight is 173 lb (78.472 kg). Her oral temperature is 98.1 F (36.7 C). Her blood pressure is 103/67 and her pulse is 78. Her respiration is 20. .   The patient's right lumpectomy scar appears noninfected, with Steri-Strips in place.  Lab Findings: Lab Results  Component Value Date   WBC 8.0 01/12/2012   HGB 11.7* 01/12/2012   HCT 35.9* 01/12/2012   MCV 92.3 01/12/2012   PLT 268 01/12/2012    CMP     Component Value Date/Time   NA 138 01/12/2012 0842   K 3.9 01/12/2012 0842   CL 103 01/12/2012 0842   CO2 23 01/12/2012 0842   GLUCOSE 89 01/12/2012 0842   BUN 20 01/12/2012 0842   CREATININE 0.78 01/12/2012 0842   CALCIUM 10.2 01/12/2012 0842   PROT 8.0 01/12/2012 0842   ALBUMIN 4.2 01/12/2012 0842   AST 14 01/12/2012 0842   ALT 11 01/12/2012 0842   ALKPHOS 76 01/12/2012 0842   BILITOT 0.2* 01/12/2012 0842   GFRNONAA 86* 01/12/2012 0842   GFRAA >90 01/12/2012 0842     Impression/Plan:  I explained to the  patient that according to national guidelines, it would be ideal for her to have clear margins. With an anterior positive margin, I would recommend reexcision before we proceed with adjuvant therapy. She is agreeable to this. She looks forward to a call from Dr. Tenna Child office to schedule reexcision. I have also talked personally with Dr. Jamey Ripa about these plans.  I look forward to seeing the patient back once she is status post reexcision, to proceed with treatment planning. We spoke today, again, about the treatment planning process which I know the patient is anxious but enthusiastic to undergo.  _____________________________________   Lonie Peak, MD

## 2012-02-09 NOTE — Progress Notes (Signed)
Please see the Nurse Progress Note in the MD Initial Consult Encounter for this patient. 

## 2012-02-09 NOTE — Progress Notes (Signed)
Pt denies pain today, does have occasional tenderness of right breast/axilla. Pt's next appt for ICD interrogation is 02/23/12.

## 2012-02-09 NOTE — Telephone Encounter (Signed)
error 

## 2012-02-10 NOTE — Addendum Note (Signed)
Encounter addended by: Glennie Hawk, RN on: 02/10/2012 10:50 AM<BR>     Documentation filed: Charges VN

## 2012-02-11 ENCOUNTER — Other Ambulatory Visit (INDEPENDENT_AMBULATORY_CARE_PROVIDER_SITE_OTHER): Payer: Self-pay | Admitting: Surgery

## 2012-02-11 ENCOUNTER — Telehealth (INDEPENDENT_AMBULATORY_CARE_PROVIDER_SITE_OTHER): Payer: Self-pay | Admitting: Surgery

## 2012-02-11 ENCOUNTER — Encounter (HOSPITAL_COMMUNITY): Payer: Self-pay | Admitting: Pharmacy Technician

## 2012-02-11 NOTE — Telephone Encounter (Signed)
I spoke with her by telephone today and reviewed again the need for reexcision of her right lumpectomy site. She is aware that need and have spoken with the radiation oncologist. She would like to go ahead and have her schedule that and we will try to do this next week.

## 2012-02-14 ENCOUNTER — Encounter: Payer: Self-pay | Admitting: Family

## 2012-02-14 ENCOUNTER — Ambulatory Visit (INDEPENDENT_AMBULATORY_CARE_PROVIDER_SITE_OTHER): Payer: Medicare Other | Admitting: Family

## 2012-02-14 VITALS — BP 94/56 | HR 97 | Temp 98.7°F | Ht 68.0 in | Wt 172.0 lb

## 2012-02-14 DIAGNOSIS — C50911 Malignant neoplasm of unspecified site of right female breast: Secondary | ICD-10-CM

## 2012-02-14 DIAGNOSIS — C50919 Malignant neoplasm of unspecified site of unspecified female breast: Secondary | ICD-10-CM

## 2012-02-14 DIAGNOSIS — E7800 Pure hypercholesterolemia, unspecified: Secondary | ICD-10-CM

## 2012-02-14 DIAGNOSIS — Z1211 Encounter for screening for malignant neoplasm of colon: Secondary | ICD-10-CM

## 2012-02-14 DIAGNOSIS — I10 Essential (primary) hypertension: Secondary | ICD-10-CM

## 2012-02-14 LAB — BASIC METABOLIC PANEL
BUN: 23 mg/dL (ref 6–23)
CO2: 22 mEq/L (ref 19–32)
Chloride: 108 mEq/L (ref 96–112)
Creatinine, Ser: 0.9 mg/dL (ref 0.4–1.2)

## 2012-02-14 LAB — LIPID PANEL
LDL Cholesterol: 51 mg/dL (ref 0–99)
Total CHOL/HDL Ratio: 2

## 2012-02-14 LAB — CBC WITH DIFFERENTIAL/PLATELET
Basophils Absolute: 0 10*3/uL (ref 0.0–0.1)
Basophils Relative: 0.8 % (ref 0.0–3.0)
Hemoglobin: 11 g/dL — ABNORMAL LOW (ref 12.0–15.0)
Lymphocytes Relative: 41.4 % (ref 12.0–46.0)
Monocytes Relative: 4.8 % (ref 3.0–12.0)
Neutro Abs: 2.7 10*3/uL (ref 1.4–7.7)
RBC: 3.6 Mil/uL — ABNORMAL LOW (ref 3.87–5.11)
RDW: 14.8 % — ABNORMAL HIGH (ref 11.5–14.6)

## 2012-02-14 NOTE — Progress Notes (Signed)
Subjective:    Patient ID: Dorothy Li, female    DOB: 09/06/1945, 66 y.o.   MRN: [id number]  HPI 66 year old [place] American female, nonsmoker, new patient to the practice is in to be established. She has a history of systolic heart failure, hypertension, hyperlipidemia, and right breast cancer. She had been in remission after lumpectomy but recently found cancer sales on a mammogram from July 2013. Currently seeing cardiology for management of her heart. She had a double bypass and 2011. She is married, with no children.   Review of Systems  Constitutional: Negative.   HENT: Negative.   Eyes: Negative.   Respiratory: Negative.   Cardiovascular: Negative.   Gastrointestinal: Negative.   Genitourinary: Negative.   Musculoskeletal: Negative.   Skin: Negative.   Neurological: Negative.   Hematological: Negative.   Psychiatric/Behavioral: Negative.    Past Medical History  Diagnosis Date  . HTN (hypertension)   . ETOH abuse      Elevated AST, mildly elevated INR (? component of cirrhosis). Has now quit ETOH.   [name] Hyperlipemia   . Cardiomyopathy     Echo (4/11) with EF 25%, diffuse hypokinesis, moderate LV dilation, decreased RV  systolic function but normal RV size, PA systolic pressure 35 mmHg. Lexiscan myoview (4/11): EF 29%,  diffuse hypokinesis, basal anteroseptal and apical scar, no ischemia.  The cardiomyopathy is likely due to a combination of CAD (LM stenosis) and ETOH abuse.   . VT (ventricular tachycardia)     Dual chamber Medtronic ICD placed 7/11  . CAD (coronary artery disease)     LHC (4/11) showed 75% ostial LM stenosis with significant damping upon catheter engagement, 60-70% mLAD, 60% ostial D1, EF 30%.  Patient had CABG with LIMA-LAD and SVG-OM2.   . Carotid stenosis     carotid dopplers (4/11) with 60-79% LICA stenosis.  Carotid dopplers (10/11): 60-79%  LICA stenosis.  . Syncope   . ICD (implantable cardiac defibrillator) in place Medtronic    Interrogated on  11/18/11  . Nephrolithiasis   . ARF (acute renal failure)     in setting dehydration with ACEI use   . Arthritis   . Stress fracture of right foot 01/17/12    Dr. Loralee Pacas  . Breast cancer 12/08/11    ER/PR +, breast 10 o'clock dx=invasive Ductal Ca,,dcis    History   Social History  . Marital Status: Married    Spouse Name: N/A    Number of Children: N/A  . Years of Education: N/A   Occupational History  . Not on file.   Social History Main Topics  . Smoking status: Former Smoker -- 1.0 packs/day for 40 years    Types: Cigarettes    Quit date: 10/08/2009  . Smokeless tobacco: Not on file  . Alcohol Use: No     Prior hx 2 drinks daily.  quit 09/2009  . Drug Use: No  . Sexually Active: Not Currently     menses age [age], G76, 3 miscarriages,no HRT   Other Topics Concern  . 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 Married, lives in [place] smoking 4/11Prior moderate ETOH use (2 liquor drinks daily) but quit in 3/11. No drug use and negative urine drug screen in 2/11    Past Surgical History  Procedure Date  . Icd implantation     ICD-Medtronic    Remote - No /  Hx of VT, LU chest  . Breast biopsy  12/08/11    Right Breast, Upper Outer Quadrant: Invasive Ductal:DCIS  . Coronary artery bypass graft 4/11     x 2  . Cardiac catheterization   . Breast lumpectomy 01-19-12    right lumpectomy/  . Abdominal hysterectomy     TAH-BSO, fibroids, age 66    Family History  Problem Relation Age of Onset  . Heart disease Sister     Had surgery  . Heart failure Sister   . Breast cancer Cousin   . Diabetes Mother     Allergies  Allergen Reactions  . Penicillins Other (See Comments)    "blacks out" per pt   . Tape Itching and Rash    Bandage on breast from bx caused rash    Current Outpatient Prescriptions on File Prior to Visit  Medication Sig Dispense Refill  . aspirin EC 81 MG tablet Take 1 tablet (81 mg total) by mouth  daily.      . Calcium Carbonate-Vitamin D (CALCIUM 600-D) 600-400 MG-UNIT per tablet Take 1 tablet by mouth 2 (two) times daily.       . carvedilol (COREG) 12.5 MG tablet Take 12.5 mg by mouth 2 (two) times daily with a meal.      . enalapril (VASOTEC) 2.5 MG tablet Take 2.5 mg by mouth daily.      . hydrALAZINE (APRESOLINE) 25 MG tablet Take 37.5 mg by mouth 3 (three) times daily. Takes 1 and 1/2 tablet      . HYDROcodone-acetaminophen (NORCO/VICODIN) 5-325 MG per tablet Take 1 tablet by mouth every 4 (four) hours as needed. For pain      . isosorbide mononitrate (IMDUR) 30 MG 24 hr tablet Take 30 mg by mouth daily before breakfast.      . rosuvastatin (CRESTOR) 40 MG tablet Take 1 tablet (40 mg total) by mouth daily.  90 tablet  1  . spironolactone (ALDACTONE) 25 MG tablet Take 25 mg by mouth daily before breakfast.        BP 94/56  Pulse 97  Temp 98.7 F (37.1 C) (Oral)  Ht 5\' 8"  (1.727 m)  Wt 172 lb (78.019 kg)  BMI 26.15 kg/m2  SpO2 99%chart    Objective:   Physical Exam  Constitutional: She is oriented to person, place, and time. She appears well-developed and well-nourished.  HENT:  Head: Normocephalic and atraumatic.  Right Ear: External ear normal.  Left Ear: External ear normal.  Nose: Nose normal.  Mouth/Throat: Oropharynx is clear and moist.  Eyes: Conjunctivae and EOM are normal. Pupils are equal, round, and reactive to light.  Neck: Normal range of motion. Neck supple.  Cardiovascular: Normal rate, regular rhythm, normal heart sounds and intact distal pulses.   Pulmonary/Chest: Effort normal and breath sounds normal.  Abdominal: Soft. Bowel sounds are normal.  Musculoskeletal: Normal range of motion.  Neurological: She is alert and oriented to person, place, and time.  Skin: Skin is warm and dry.  Psychiatric: She has a normal mood and affect.          Assessment & Plan:  Assessment: Hypertension, breast cancer, right breast, systolic heart failure  Plan:  Lab sent to include BMP, CBC, lipids notify patient pending results. we'll schedule patient for a welcome to Medicare complete physical exam. Encouraged healthy diet and exercise. We'll followup with patient in about 6 weeks and sooner when necessary. Schedule colonoscopy.

## 2012-02-15 ENCOUNTER — Encounter (HOSPITAL_COMMUNITY): Payer: Self-pay

## 2012-02-15 ENCOUNTER — Encounter (HOSPITAL_COMMUNITY)
Admission: RE | Admit: 2012-02-15 | Discharge: 2012-02-15 | Disposition: A | Discharge status: Home / Self Care | Payer: Medicare Other | Source: Ambulatory Visit | Attending: Surgery | Admitting: Surgery

## 2012-02-15 ENCOUNTER — Other Ambulatory Visit: Payer: Self-pay

## 2012-02-15 ENCOUNTER — Encounter: Payer: Self-pay | Admitting: *Deleted

## 2012-02-15 HISTORY — DX: Rash and other nonspecific skin eruption: R21

## 2012-02-15 LAB — SURGICAL PCR SCREEN: MRSA, PCR: NEGATIVE

## 2012-02-15 MED ORDER — CHLORHEXIDINE GLUCONATE 4 % EX LIQD
1.0000 "application " | Freq: Once | CUTANEOUS | Status: DC
  Filled 2012-02-15: qty 15

## 2012-02-15 NOTE — Progress Notes (Signed)
I spoke with Leta Jungling , the medtronics Rep , she will be here for pt' surg

## 2012-02-15 NOTE — Patient Instructions (Addendum)
20 Dorothy Li  02/15/2012   Your procedure is scheduled on:  02/16/12 AT 9:00 AM  Report to SHORT STAY DEPT  at 7:00 AM.  Call this number if you have problems the morning of surgery: [phone]   Remember:   Do not eat food or drink liquids AFTER MIDNIGHT    Take these medicines the morning of surgery with A SIP OF WATER: CARVEDILOL / HYDRALAZINE / ISOSORBIDE / HYDROCODONE IF NEEDED   Do not wear jewelry, make-up or nail polish.  Do not wear lotions, powders, or perfumes.   Do not shave legs or underarms 48 hrs. before surgery (men may shave face)  Do not bring valuables to the hospital.  Contacts, dentures or bridgework may not be worn into surgery.  Leave suitcase in the car. After surgery it may be brought to your room.  For patients admitted to the hospital, checkout time is 11:00 AM the day of discharge.   Patients discharged the day of surgery will not be allowed to drive home.    Special Instructions:   Please read over the following fact sheets that you were given: MRSA  Information               SHOWER WITH BETASEPT THE NIGHT BEFORE SURGERY AND THE MORNING OF SURGERY

## 2012-02-16 ENCOUNTER — Encounter (HOSPITAL_COMMUNITY): Payer: Self-pay | Admitting: Anesthesiology

## 2012-02-16 ENCOUNTER — Ambulatory Visit (HOSPITAL_COMMUNITY)
Admission: RE | Admit: 2012-02-16 | Discharge: 2012-02-16 | Disposition: A | Discharge status: Home / Self Care | Payer: Medicare Other | Source: Ambulatory Visit | Attending: Surgery | Admitting: Surgery

## 2012-02-16 ENCOUNTER — Ambulatory Visit (HOSPITAL_COMMUNITY): Payer: Medicare Other | Admitting: Anesthesiology

## 2012-02-16 ENCOUNTER — Encounter (HOSPITAL_COMMUNITY): Payer: Self-pay | Admitting: *Deleted

## 2012-02-16 ENCOUNTER — Encounter (HOSPITAL_COMMUNITY)
Admission: RE | Disposition: A | Discharge status: Home / Self Care | Payer: Self-pay | Source: Ambulatory Visit | Attending: Surgery

## 2012-02-16 DIAGNOSIS — E785 Hyperlipidemia, unspecified: Secondary | ICD-10-CM | POA: Insufficient documentation

## 2012-02-16 DIAGNOSIS — Z01812 Encounter for preprocedural laboratory examination: Secondary | ICD-10-CM | POA: Insufficient documentation

## 2012-02-16 DIAGNOSIS — Z95810 Presence of automatic (implantable) cardiac defibrillator: Secondary | ICD-10-CM | POA: Insufficient documentation

## 2012-02-16 DIAGNOSIS — Z951 Presence of aortocoronary bypass graft: Secondary | ICD-10-CM | POA: Insufficient documentation

## 2012-02-16 DIAGNOSIS — Z79899 Other long term (current) drug therapy: Secondary | ICD-10-CM | POA: Insufficient documentation

## 2012-02-16 DIAGNOSIS — I5020 Unspecified systolic (congestive) heart failure: Secondary | ICD-10-CM | POA: Insufficient documentation

## 2012-02-16 DIAGNOSIS — C50919 Malignant neoplasm of unspecified site of unspecified female breast: Secondary | ICD-10-CM | POA: Insufficient documentation

## 2012-02-16 DIAGNOSIS — I10 Essential (primary) hypertension: Secondary | ICD-10-CM | POA: Insufficient documentation

## 2012-02-16 DIAGNOSIS — C50911 Malignant neoplasm of unspecified site of right female breast: Secondary | ICD-10-CM

## 2012-02-16 DIAGNOSIS — I2510 Atherosclerotic heart disease of native coronary artery without angina pectoris: Secondary | ICD-10-CM | POA: Insufficient documentation

## 2012-02-16 SURGERY — EXCISION, LESION, BREAST
Anesthesia: General | Site: Breast | Laterality: Right | Wound class: Clean

## 2012-02-16 MED ORDER — PROMETHAZINE HCL 25 MG/ML IJ SOLN: 6.2500 mg | INTRAMUSCULAR | Status: DC | PRN

## 2012-02-16 MED ORDER — LACTATED RINGERS IV SOLN: INTRAVENOUS | Status: DC

## 2012-02-16 MED ORDER — FENTANYL CITRATE 0.05 MG/ML IJ SOLN
INTRAMUSCULAR | Status: DC | PRN
  Administered 2012-02-16: 50 ug via INTRAVENOUS
  Administered 2012-02-16: 100 ug via INTRAVENOUS
  Administered 2012-02-16: 50 ug via INTRAVENOUS

## 2012-02-16 MED ORDER — CIPROFLOXACIN IN D5W 400 MG/200ML IV SOLN
400.0000 mg | INTRAVENOUS | Status: AC
  Administered 2012-02-16: 400 mg via INTRAVENOUS

## 2012-02-16 MED ORDER — LACTATED RINGERS IV SOLN
INTRAVENOUS | Status: DC | PRN
  Administered 2012-02-16: 09:00:00 via INTRAVENOUS

## 2012-02-16 MED ORDER — PROPOFOL 10 MG/ML IV BOLUS
INTRAVENOUS | Status: DC | PRN
  Administered 2012-02-16: 200 mg via INTRAVENOUS

## 2012-02-16 MED ORDER — CIPROFLOXACIN IN D5W 400 MG/200ML IV SOLN
INTRAVENOUS | Status: AC
  Filled 2012-02-16: qty 200

## 2012-02-16 MED ORDER — BUPIVACAINE HCL 0.25 % IJ SOLN
INTRAMUSCULAR | Status: DC | PRN
  Administered 2012-02-16: 30 mL

## 2012-02-16 MED ORDER — BUPIVACAINE HCL (PF) 0.25 % IJ SOLN
INTRAMUSCULAR | Status: AC
  Filled 2012-02-16: qty 30

## 2012-02-16 MED ORDER — FENTANYL CITRATE 0.05 MG/ML IJ SOLN: 25.0000 ug | INTRAMUSCULAR | Status: DC | PRN

## 2012-02-16 MED ORDER — LIDOCAINE HCL (CARDIAC) 20 MG/ML IV SOLN
INTRAVENOUS | Status: DC | PRN
  Administered 2012-02-16: 50 mg via INTRAVENOUS

## 2012-02-16 MED ORDER — ONDANSETRON HCL 4 MG/2ML IJ SOLN
INTRAMUSCULAR | Status: DC | PRN
  Administered 2012-02-16: 4 mg via INTRAVENOUS

## 2012-02-16 MED ORDER — MEPERIDINE HCL 50 MG/ML IJ SOLN: 6.2500 mg | INTRAMUSCULAR | Status: DC | PRN

## 2012-02-16 SURGICAL SUPPLY — 40 items
ADH SKN CLS APL DERMABOND .7 (GAUZE/BANDAGES/DRESSINGS) ×1
APL SKNCLS STERI-STRIP NONHPOA (GAUZE/BANDAGES/DRESSINGS)
BENZOIN TINCTURE PRP APPL 2/3 (GAUZE/BANDAGES/DRESSINGS) ×1 IMPLANT
BINDER BREAST LRG (GAUZE/BANDAGES/DRESSINGS) ×1 IMPLANT
BLADE HEX COATED 2.75 (ELECTRODE) ×2 IMPLANT
CANISTER SUCTION 2500CC (MISCELLANEOUS) ×2 IMPLANT
CLOTH BEACON ORANGE TIMEOUT ST (SAFETY) ×2 IMPLANT
DERMABOND ADVANCED (GAUZE/BANDAGES/DRESSINGS) ×1
DERMABOND ADVANCED .7 DNX12 (GAUZE/BANDAGES/DRESSINGS) IMPLANT
DRAIN CHANNEL 10F 3/8 F FF (DRAIN) ×2 IMPLANT
DRAPE LAPAROSCOPIC ABDOMINAL (DRAPES) ×2 IMPLANT
DRAPE LG THREE QUARTER DISP (DRAPES) ×2 IMPLANT
DRSG PAD ABDOMINAL 8X10 ST (GAUZE/BANDAGES/DRESSINGS) ×2 IMPLANT
ELECT REM PT RETURN 9FT ADLT (ELECTROSURGICAL) ×2
ELECTRODE REM PT RTRN 9FT ADLT (ELECTROSURGICAL) ×1 IMPLANT
EVACUATOR DRAINAGE 10X20 100CC (DRAIN) ×2 IMPLANT
EVACUATOR SILICONE 100CC (DRAIN) ×2 IMPLANT
GLOVE BIO SURGEON STRL SZ7.5 (GLOVE) ×4 IMPLANT
GLOVE BIOGEL PI IND STRL 7.0 (GLOVE) ×1 IMPLANT
GLOVE BIOGEL PI INDICATOR 7.0 (GLOVE) ×1
GOWN PREVENTION PLUS XLARGE (GOWN DISPOSABLE) ×1 IMPLANT
GOWN STRL NON-REIN LRG LVL3 (GOWN DISPOSABLE) ×2 IMPLANT
GOWN STRL REIN XL XLG (GOWN DISPOSABLE) ×2 IMPLANT
IV SET HUBERPLUS 22X1 SAFETY (NEEDLE) ×1 IMPLANT
KIT BASIN OR (CUSTOM PROCEDURE TRAY) ×2 IMPLANT
NS IRRIG 1000ML POUR BTL (IV SOLUTION) ×2 IMPLANT
PACK GENERAL/GYN (CUSTOM PROCEDURE TRAY) ×2 IMPLANT
PEN SKIN MARKING BROAD (MISCELLANEOUS) ×2 IMPLANT
SPONGE GAUZE 4X4 12PLY (GAUZE/BANDAGES/DRESSINGS) ×2 IMPLANT
SPONGE LAP 18X18 X RAY DECT (DISPOSABLE) ×4 IMPLANT
SPONGE LAP 4X18 X RAY DECT (DISPOSABLE) ×1 IMPLANT
STAPLER VISISTAT 35W (STAPLE) ×1 IMPLANT
STRIP CLOSURE SKIN 1/2X4 (GAUZE/BANDAGES/DRESSINGS) ×1 IMPLANT
SUT ETHILON 3 0 PS 1 (SUTURE) ×4 IMPLANT
SUT SILK 3 0 (SUTURE) ×2
SUT SILK 3-0 18XBRD TIE 12 (SUTURE) ×1 IMPLANT
SUT VIC AB 3-0 SH 18 (SUTURE) ×2 IMPLANT
SYR 20CC LL (SYRINGE) ×1 IMPLANT
TOWEL OR 17X26 10 PK STRL BLUE (TOWEL DISPOSABLE) ×4 IMPLANT
WATER STERILE IRR 1500ML POUR (IV SOLUTION) ×1 IMPLANT

## 2012-02-16 NOTE — Progress Notes (Signed)
medtronic representative notified of patient in pacu.  Device detection needs to be turned back on .

## 2012-02-16 NOTE — Transfer of Care (Signed)
Immediate Anesthesia Transfer of Care Note  Patient: Dorothy Li  Procedure(s) Performed: Procedure(s) (LRB): RE-EXCISION OF BREAST LUMPECTOMY (Right)  Patient Location: PACU  Anesthesia Type: General  Level of Consciousness: awake, alert , oriented and patient cooperative  Airway & Oxygen Therapy: Patient Spontanous Breathing, Patient connected to face mask oxygen and c/o pain.  Post-op Assessment: Report given to PACU RN, Post -op Vital signs reviewed and stable and PACU nurse notified of need for ICD to be reprogrammed.  Post vital signs: Reviewed and stable  Complications: No apparent anesthesia complications

## 2012-02-16 NOTE — H&P (View-Only) (Signed)
HPI  Dorothy Li is a 66 y.o. female. She presents with a newly diagnosed right breast cancer found as an abnormality on routine mammogram. She was asymptomatic, no prior breast problems or symptoms, FH + for some cousins with breast cancer. After evaluation she has elected to proceed to lyumpectomy and SLN HPI  Past Medical History   Diagnosis  Date   .  HTN (hypertension)    .  ETOH abuse      Elevated AST, mildly elevated INR (? component of cirrhosis). Has now quit ETOH.   .  Hyperlipemia    .  Nephrolithiasis    .  Cardiomyopathy      Echo (4/11) with EF 25%, diffuse hypokinesis, moderate LV dilation, decreased RV systolic function but normal RV size, PA systolic pressure 35 mmHg. Lexiscan myoview (4/11): EF 29%, diffuse hypokinesis, basal anteroseptal and apical scar, no ischemia. The cardiomyopathy is likely due to a combination of CAD (LM stenosis) and ETOH abuse.   .  VT (ventricular tachycardia)      Dual chamber Medtronic ICD placed 7/11   .  CAD (coronary artery disease)      LHC (4/11) showed 75% ostial LM stenosis with significant damping upon catheter engagement, 60-70% mLAD, 60% ostial D1, EF 30%. Patient had CABG with LIMA-LAD and SVG-OM2.   .  Carotid stenosis      carotid dopplers (4/11) with 60-79% LICA stenosis. Carotid dopplers (10/11): 60-79% LICA stenosis.   .  Syncope    .  ARF (acute renal failure)      in setting dehydration with ACEI use   .  Cancer      right breast    Past Surgical History   Procedure  Date   .  Icd implantation      ICD-Medtronic Remote - No   .  Coronary artery bypass graft    .  Abdominal hysterectomy     Family History   Problem  Relation  Age of Onset   .  Heart disease  Sister    Social History  History   Substance Use Topics   .  Smoking status:  Former Smoker -- 1.0 packs/day for 40 years     Types:  Cigarettes     Quit date:  10/08/2009   .  Smokeless tobacco:  Not on file   .  Alcohol Use:  No    Allergies     Allergen  Reactions   .  Penicillins     Current Outpatient Prescriptions   Medication  Sig  Dispense  Refill   .  acetaminophen (TYLENOL) 325 MG tablet  Use two tablets as needed for shoulder pain     .  aspirin EC 81 MG tablet  Take 1 tablet (81 mg total) by mouth daily.     .  Calcium Carbonate-Vitamin D (CALCIUM 600-D) 600-400 MG-UNIT per tablet  Take by mouth daily.     .  carvedilol (COREG) 12.5 MG tablet  Take 1 tablet (12.5 mg total) by mouth 2 (two) times daily.  60 tablet  6   .  enalapril (VASOTEC) 2.5 MG tablet  Take 1 tablet (2.5 mg total) by mouth 2 (two) times daily.  60 tablet  6   .  furosemide (LASIX) 20 MG tablet  every other day. Take one every other day     .  hydrALAZINE (APRESOLINE) 25 MG tablet  Take 1 tablet (25 mg total) by mouth 3 (three) times   daily.  90 tablet  6   .  isosorbide mononitrate (IMDUR) 30 MG 24 hr tablet  Take 1 tablet (30 mg total) by mouth daily.  30 tablet  6   .  potassium chloride (KLOR-CON) 10 MEQ CR tablet  every other day. Take one every other day     .  rosuvastatin (CRESTOR) 40 MG tablet  Take 1 tablet (40 mg total) by mouth daily.  30 tablet  6   .  spironolactone (ALDACTONE) 25 MG tablet  Take 1 tablet (25 mg total) by mouth daily.  30 tablet  6   Review of Systems  Review of Systems  Respiratory: Positive for cough.  Skin: Positive for rash.  Rash at site of biopsy from tape irritation  Blood pressure 102/60, pulse 86, temperature 97.4 F (36.3 C), temperature source Temporal, resp. rate 16, height 5' 8.5" (1.74 m), weight 173 lb (78.472 kg).  Physical Exam  Physical Exam  Vitals reviewed.  Constitutional: She is oriented to person, place, and time. She appears well-developed and well-nourished. No distress.  HENT:  Head: Normocephalic and atraumatic.  Mouth/Throat: Oropharynx is clear and moist.  Eyes: Conjunctivae and EOM are normal. Pupils are equal, round, and reactive to light. No scleral icterus.  Neck: Normal range of  motion. Neck supple. No tracheal deviation present. No thyromegaly present.  Cardiovascular: Normal rate, regular rhythm, normal heart sounds and intact distal pulses. Exam reveals no gallop and no friction rub.  No murmur heard.  Pulmonary/Chest: Effort normal and breath sounds normal. No respiratory distress. She has no wheezes. She has no rales.   Breast No palpable mass, guuidewire in situ Scars from CABG and defibrillator. No breast mass or other abnormality on either side  Abdominal: Soft. Bowel sounds are normal. She exhibits no distension and no mass. There is no tenderness. There is no rebound and no guarding.  Musculoskeletal: Normal range of motion. She exhibits no edema and no tenderness.  Lymphadenopathy:  She has no axillary adenopathy.  Neurological: She is alert and oriented to person, place, and time.  Skin: Skin is warm and dry. No rash noted. She is not diaphoretic. No erythema.  Psychiatric: She has a normal mood and affect. Her behavior is normal. Judgment and thought content normal.  Data Reviewed  Mammogram and sono reports and path report  Assessment  Clinical Stahe I right breast cancer, UOQ, prognostic panel pending  CAD, S/P bypass  Defibrillator  Systolic Heart failure  Plan: Wire localized lumpectomy and SLN on right  

## 2012-02-16 NOTE — Op Note (Signed)
Dorothy Li Nov 07, 1945 [id number] 02/11/2012  Preoperative diagnosis: Invasive ductal carcinoma, right breast, status post lumpectomy with focally positive anterior margin  Postoperative diagnosis: Same  Procedure: Reexcision of right breast lumpectomy site  Surgeon: Currie Paris, MD, FACS    Anesthesia: General   Clinical History and Indications: This patient was recently diagnosed with a right breast cancer, invasive ductal. After wire localized lumpectomy for anterior margin was focally positive for cancer. After discussion with the patient, and her medical and radiation oncologist, she elected to proceed to a reexcision to try to achieve a positive margin. We plan to excise the skin and subcutaneous tissue down to the lumpectomy cavity.    Description of Procedure: I saw the patient and preoperative area and confirmed the plans. The right breast as marked as the operative site.  The patient was taken to the operating room and after satisfactory general anesthesia had been obtained the right breast was prepped and draped. A timeout was performed.  I outlined an elliptical incision in the skin around the prior scar which was transversely oriented in about the 9:30 position of the right breast. I marked a 5 mm margin on either side. The incision was made in the skin subjacent breast tissues excised all the way down to the residual lumpectomy cavity. The specimen was marked with sutures for pathology orientation purposes.  I injected 30 cc of 0.25% plain Marcaine to help with postop anesthesia. I made sure everything was dry. I irrigated. I closed in layers with 3-0 Vicryl, 4-0 Monocryl subcuticular, and Dermabond.  The patient tolerated the procedure well. There no operative complications. Counts were correct.  Currie Paris, MD, FACS 02/16/2012 9:43 AM

## 2012-02-16 NOTE — Anesthesia Postprocedure Evaluation (Signed)
  Anesthesia Post-op Note  Patient: Dorothy Li  Procedure(s) Performed: Procedure(s) (LRB): RE-EXCISION OF BREAST LUMPECTOMY (Right)  Patient Location: PACU  Anesthesia Type: General  Level of Consciousness: awake and alert   Airway and Oxygen Therapy: Patient Spontanous Breathing  Post-op Pain: mild  Post-op Assessment: Post-op Vital signs reviewed, Patient's Cardiovascular Status Stable, Respiratory Function Stable, Patent Airway and No signs of Nausea or vomiting  Post-op Vital Signs: stable  Complications: No apparent anesthesia complications

## 2012-02-16 NOTE — Progress Notes (Signed)
Rep returned call, will be to 1 hour before he can return to Home pacu to have device detection turned back on

## 2012-02-16 NOTE — Interval H&P Note (Signed)
History and Physical Interval Note:  02/16/2012 8:58 AM  Dorothy Li  has presented today for surgery, with the diagnosis of right breast cancer  The various methods of treatment have been discussed with the patient and family. After consideration of risks, benefits and other options for treatment, the patient has consented to  Procedure(s) (LRB): RE-EXCISION OF BREAST LUMPECTOMY (Right) as a surgical intervention .  The patient's history has been reviewed, patient examined, no change in status, stable for surgery.  I have reviewed the patient's chart and labs.  Questions were answered to the patient's satisfaction.    Patient had lumpectomy with close anterior margin, and has elected to proceed to re-excision, including some skin. The patient's exam is basically unchanged except for healing surgical wound now present.  I marked the right breast as the operative side.   Ruvim Risko J

## 2012-02-16 NOTE — Progress Notes (Signed)
medtronic rep in.  Device back on.

## 2012-02-16 NOTE — Preoperative (Signed)
Beta Blockers   Reason not to administer Beta Blockers:Took Coreg this am. 

## 2012-02-16 NOTE — Anesthesia Preprocedure Evaluation (Signed)
Anesthesia Evaluation  Patient identified by MRN, date of birth, ID band Patient awake    Reviewed: Allergy & Precautions, H&P , NPO status , Patient's Chart, lab work & pertinent test results, reviewed documented beta blocker date and time   History of Anesthesia Complications (+) AWARENESS UNDER ANESTHESIA  Airway Mallampati: II TM Distance: >3 FB Neck ROM: Full    Dental No notable dental hx. (+) Teeth Intact and Dental Advisory Given   Pulmonary former smoker,  breath sounds clear to auscultation  Pulmonary exam normal       Cardiovascular hypertension, On Home Beta Blockers + CAD and + CABG + dysrhythmias Ventricular Tachycardia + Cardiac Defibrillator Rhythm:Regular Rate:Normal     Neuro/Psych negative neurological ROS  negative psych ROS   GI/Hepatic negative GI ROS, Neg liver ROS,   Endo/Other  negative endocrine ROS  Renal/GU negative Renal ROS  negative genitourinary   Musculoskeletal   Abdominal   Peds  Hematology negative hematology ROS (+)   Anesthesia Other Findings   Reproductive/Obstetrics negative OB ROS                           Anesthesia Physical  Anesthesia Plan  ASA: III  Anesthesia Plan: General   Post-op Pain Management:    Induction: Intravenous  Airway Management Planned: LMA  Additional Equipment:   Intra-op Plan:   Post-operative Plan: Extubation in OR  Informed Consent: I have reviewed the patients History and Physical, chart, labs and discussed the procedure including the risks, benefits and alternatives for the proposed anesthesia with the patient or authorized representative who has indicated his/her understanding and acceptance.   Dental advisory given  Plan Discussed with: CRNA  Anesthesia Plan Comments:         Anesthesia Quick Evaluation

## 2012-02-17 ENCOUNTER — Telehealth (INDEPENDENT_AMBULATORY_CARE_PROVIDER_SITE_OTHER): Payer: Self-pay | Admitting: General Surgery

## 2012-02-17 NOTE — Telephone Encounter (Signed)
Message copied by Liliana Cline on Thu Feb 17, 2012  4:40 PM ------      Message from: Currie Paris      Created: Thu Feb 17, 2012  4:29 PM       Tell the patient that her margins are OK a. I will discuss in detail in the office.

## 2012-02-17 NOTE — Telephone Encounter (Signed)
Left message on machine for patient to call back.

## 2012-02-23 ENCOUNTER — Encounter: Payer: Self-pay | Admitting: Internal Medicine

## 2012-02-23 ENCOUNTER — Ambulatory Visit (INDEPENDENT_AMBULATORY_CARE_PROVIDER_SITE_OTHER): Payer: Medicare Other | Admitting: *Deleted

## 2012-02-23 DIAGNOSIS — I5022 Chronic systolic (congestive) heart failure: Secondary | ICD-10-CM

## 2012-02-23 DIAGNOSIS — I428 Other cardiomyopathies: Secondary | ICD-10-CM

## 2012-02-23 NOTE — Progress Notes (Signed)
ICD check with ICM 

## 2012-02-25 ENCOUNTER — Encounter: Payer: Self-pay | Admitting: Cardiology

## 2012-02-25 ENCOUNTER — Ambulatory Visit (INDEPENDENT_AMBULATORY_CARE_PROVIDER_SITE_OTHER): Payer: Medicare Other | Admitting: Cardiology

## 2012-02-25 ENCOUNTER — Telehealth: Payer: Self-pay | Admitting: Cardiology

## 2012-02-25 VITALS — BP 116/60 | HR 100 | Ht 69.0 in | Wt 172.0 lb

## 2012-02-25 DIAGNOSIS — E7800 Pure hypercholesterolemia, unspecified: Secondary | ICD-10-CM

## 2012-02-25 DIAGNOSIS — I25810 Atherosclerosis of coronary artery bypass graft(s) without angina pectoris: Secondary | ICD-10-CM

## 2012-02-25 DIAGNOSIS — I6529 Occlusion and stenosis of unspecified carotid artery: Secondary | ICD-10-CM

## 2012-02-25 DIAGNOSIS — I5022 Chronic systolic (congestive) heart failure: Secondary | ICD-10-CM

## 2012-02-25 LAB — ICD DEVICE OBSERVATION
AL AMPLITUDE: 4.1 mv
ATRIAL PACING ICD: 0.6 pct
BATTERY VOLTAGE: 3.3 V
RV LEAD AMPLITUDE: 4.3 mv
TZAT-0001ATACH: 3
TZAT-0001FASTVT: 1
TZAT-0002ATACH: NEGATIVE
TZAT-0002FASTVT: NEGATIVE
TZAT-0011SLOWVT: 10 ms
TZAT-0012ATACH: 150 ms
TZAT-0012ATACH: 150 ms
TZAT-0012FASTVT: 200 ms
TZAT-0012SLOWVT: 200 ms
TZAT-0012SLOWVT: 200 ms
TZAT-0018ATACH: NEGATIVE
TZAT-0018SLOWVT: NEGATIVE
TZAT-0018SLOWVT: NEGATIVE
TZAT-0019ATACH: 6 V
TZAT-0019ATACH: 6 V
TZAT-0019SLOWVT: 8 V
TZAT-0019SLOWVT: 8 V
TZAT-0020ATACH: 1.5 ms
TZAT-0020ATACH: 1.5 ms
TZAT-0020ATACH: 1.5 ms
TZAT-0020SLOWVT: 1.5 ms
TZAT-0020SLOWVT: 1.5 ms
TZON-0003VSLOWVT: 450 ms
TZON-0005SLOWVT: 12
TZST-0001ATACH: 5
TZST-0001ATACH: 6
TZST-0001FASTVT: 3
TZST-0001FASTVT: 4
TZST-0001FASTVT: 5
TZST-0001SLOWVT: 4
TZST-0001SLOWVT: 5
TZST-0002ATACH: NEGATIVE
TZST-0002FASTVT: NEGATIVE
TZST-0002FASTVT: NEGATIVE
TZST-0003SLOWVT: 25 J
TZST-0003SLOWVT: 35 J
TZST-0003SLOWVT: 35 J
VENTRICULAR PACING ICD: 0.2 pct

## 2012-02-25 MED ORDER — HYDRALAZINE HCL 50 MG PO TABS: 50.0000 mg | ORAL_TABLET | Freq: Three times a day (TID) | ORAL | Status: DC

## 2012-02-25 MED ORDER — ISOSORBIDE MONONITRATE ER 60 MG PO TB24: 60.0000 mg | ORAL_TABLET | Freq: Every day | ORAL | Status: DC

## 2012-02-25 NOTE — Patient Instructions (Addendum)
Increase Hydralazine to 50mg  three times a day. You can take two 25mg  tablets three times a day and use your current supply.  Increase Imdur (isosorbide) to 60mg  daily. You can take two 30mg  tablets daily at the same time and use your current supply.  Your physician recommends that you return for lab work in: 3 months --BMET.  Your physician recommends that you schedule a follow-up appointment in: 4 months with Dr Shirlee Latch.

## 2012-02-25 NOTE — Telephone Encounter (Signed)
Spoke with pt. Pt states she has been on isosorbide 60mg  daily since 12/28/11 not isosorbide 30mg  daily. I will forward to Dr Shirlee Latch.

## 2012-02-25 NOTE — Telephone Encounter (Signed)
New msg Pt wants to discuss isosorbide dosage. Please call

## 2012-02-27 NOTE — Assessment & Plan Note (Signed)
Doing well symptomatically, NYHA class II.  She is not volume overloaded on exam and seems to be doing fine off Lasix.  - Continue current enalapril, Coreg, and spironolactone.  I will not increase enalapril further at this time, has had ARF in the past with ACEI increase (in association with dehydration).  - Increase hydralazine to 50 mg tid and Imdur to 60 mg daily.  - Continue exercising at [facility].  - BMET at followup in 3 months.

## 2012-02-27 NOTE — Assessment & Plan Note (Signed)
LDL is at goal (< 70).  

## 2012-02-27 NOTE — Assessment & Plan Note (Signed)
Stable with no ischemic symptoms.  Continue ASA 81, beta blocker, ACEI, statin.  

## 2012-02-27 NOTE — Assessment & Plan Note (Signed)
Moderate LICA stenosis, repeat carotids in 11/13.

## 2012-02-27 NOTE — Telephone Encounter (Signed)
Continue current Imdur dose

## 2012-02-27 NOTE — Progress Notes (Signed)
Patient ID: Dorothy Li, female   DOB: 1946/02/16, 66 y.o.   MRN: [id number] PCP: Adline Mango  66 yo with history of cardiomyopathy s/p ICD, and CAD s/p CABG presents for followup.   Patient had a left heart cath in 4/11 which showed a 75% ostial left main stenosis. She had CABG x 2.  She has moderate to severe LV systolic dysfunction that I think is a mixed picture, due to both CAD and probably ETOH abuse.  Since her CABG, she has quit smoking and quit drinking ETOH. She had syncope and episodes of VT on monitor, so Medtronic ICD was placed in 7/11.  Last summer, patient had an episode of profound dehydration with renal failure (house not air conditioned, poor po intake with use of Lasix).  This resolved with rehydration.  This summer, she had an air conditioner installed and has not had any problem with dehydration.  Dorothy Li has been doing well symptomatically since I last saw her.  No exertional dyspnea or chest pain.  No lightheadedness.  No orthopnea or PND.  She developed breast cancer and had a lumpectomy in July.  She is going to be starting radiation.  Weight is up 2 lbs compared to prior appointment.   Labs (8/11): BNP 181 Labs (9/11): K 4.4, creatinine 0.6 Labs (10/11): BNP 95 Labs (11/11): BNP 65, creatinine 0.8, K 4.7, LDL 86, HDL 55 Labs (12/11): K 4.5, creatinine 0.7 Labs (1/12): LDL 69, HDL 41 Labs (6/12): K 4.9, creatinine 0.97, LDL 82, HDL 45 Labs (7/12): creatinine 3.36 => 2.57 Labs (9/12): K 3.7, creatinine 0.9, BNP 65 Labs (10/12): K 4.4, creatinine 0.8 Labs (2/13): K 4.3, creatinine 0.82, LDL 77, HDL 45 Labs (5/13): K 4.1, creatinine 1.0 Labs (8/13): K 4.7, creatinine 0.9, LDL 51, HDL 60  Allergies:  1)  ! Pcn  Past Medical History: 1. HTN 2. ETOH abuse: Elevated AST, mildly elevated INR (? component of cirrhosis). Has now quit ETOH.  3. History of ARF in setting of dehydration/ACEI in the summer of 2011 and again in 2012.  4. Hyperlipidemia 5.  Nephrolithiasis 6. Cardiomyopathy: Echo (4/11) with EF 25%, diffuse hypokinesis, moderate LV dilation, decreased RV systolic function but normal RV size, PA systolic pressure 35 mmHg. Lexiscan myoview (4/11): EF 29%, diffuse hypokinesis, basal anteroseptal and apical scar, no ischemia.  The cardiomyopathy is likely due to a combination of CAD (LM stenosis) and ETOH abuse.  ANA and SPEP were negative.  Echo (6/11): EF 25%, mild diastolic dysfunction, normal RV, no pulmonary HTN 7. VT: Dual chamber Medtronic ICD placed 7/11 8. CAD: LHC (4/11) showed 75% ostial LM stenosis with significant damping upon catheter engagement, 60-70% mLAD, 60% ostial D1, EF 30%.  Patient had CABG with LIMA-LAD and SVG-OM2.  9. carotid stenosis: carotid dopplers (4/11) with 60-79% LICA stenosis.  Carotid dopplers (10/11): 60-79% LICA stenosis.  Carotids (4/12): 60-79% LICA.  Carotids (11/12): 60-79% LICA.   Carotids (5/13): 60-79% LICA.  10.  Syncope 11. Breast cancer: Right lumpectomy in 7/13, plan for radiation.   Family History: Sister with heart surgery, she is not sure why.   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 Married, lives in [place] Quit smoking 4/11 Prior moderate ETOH use (2 liquor drinks daily) but quit in 3/11.  No drug use and negative urine drug screen in 2/11  Review of Systems        All systems reviewed and negative except as per HPI.   Current Outpatient  Prescriptions  Medication Sig Dispense Refill  . aspirin EC 81 MG tablet Take 1 tablet (81 mg total) by mouth daily.      . Calcium Carbonate-Vitamin D (CALCIUM 600-D) 600-400 MG-UNIT per tablet Take 1 tablet by mouth 2 (two) times daily.       . carvedilol (COREG) 12.5 MG tablet Take 12.5 mg by mouth 2 (two) times daily with a meal.      . enalapril (VASOTEC) 2.5 MG tablet Take 2.5 mg by mouth 2 (two) times daily.       [name] HYDROcodone-acetaminophen (NORCO/VICODIN) 5-325 MG per tablet Take 1 tablet by mouth every 4  (four) hours as needed. For pain      . rosuvastatin (CRESTOR) 40 MG tablet Take 1 tablet (40 mg total) by mouth daily.  90 tablet  1  . spironolactone (ALDACTONE) 25 MG tablet Take 25 mg by mouth daily before breakfast.      . hydrALAZINE (APRESOLINE) 50 MG tablet Take 1 tablet (50 mg total) by mouth 3 (three) times daily.  90 tablet  6  . isosorbide mononitrate (IMDUR) 60 MG 24 hr tablet Take 1 tablet (60 mg total) by mouth daily.  30 tablet  6    BP 116/60  Pulse 100  Ht 5\' 9"  (1.753 m)  Wt 172 lb (78.019 kg)  BMI 25.40 kg/m2 General:  Well developed, well nourished, in no acute distress. Neck:  Neck supple, no JVD. No masses, thyromegaly or abnormal cervical nodes. Lungs:  Clear bilaterally to auscultation and percussion. Heart:  Non-displaced PMI, chest non-tender; regular rate and rhythm, S1, S2 without murmurs, rubs. +S4. Carotid upstroke normal, no bruit.  Pedals normal pulses. No edema, no varicosities. Abdomen:  Bowel sounds positive; abdomen soft and non-tender without masses, organomegaly, or hernias noted. No hepatosplenomegaly. Extremities:  No clubbing or cyanosis. Neurologic:  Alert and oriented x 3. Psych:  Normal affect.

## 2012-02-28 NOTE — Telephone Encounter (Signed)
Spoke with pt. She is aware she should continue Imdur 60mg  daily.

## 2012-03-10 ENCOUNTER — Encounter (INDEPENDENT_AMBULATORY_CARE_PROVIDER_SITE_OTHER): Payer: Self-pay | Admitting: Surgery

## 2012-03-10 ENCOUNTER — Ambulatory Visit (INDEPENDENT_AMBULATORY_CARE_PROVIDER_SITE_OTHER): Payer: Medicare Other | Admitting: Surgery

## 2012-03-10 VITALS — BP 122/80 | HR 90 | Temp 97.2°F | Resp 18 | Ht 68.0 in | Wt 173.4 lb

## 2012-03-10 DIAGNOSIS — C50919 Malignant neoplasm of unspecified site of unspecified female breast: Secondary | ICD-10-CM

## 2012-03-10 DIAGNOSIS — C50911 Malignant neoplasm of unspecified site of right female breast: Secondary | ICD-10-CM

## 2012-03-10 NOTE — Progress Notes (Signed)
Chief complaint: Postop reexcision lumpectomy site  History of present illness: This patient underwent an initial lumpectomy for a breast cancer. She had margin that was thought to be involved there reexcision was done. She is come back because of this a feeling that she is doing well.  Sam: Vital signs: BP 122/80  Pulse 90  Temp 97.2 F (36.2 C) (Temporal)  Resp 18  Ht 5\' 8"  (1.727 m)  Wt 173 lb 6.4 oz (78.654 kg)  BMI 26.37 kg/m2  SpO2 100% General: The patient alert oriented healthy-appearing Breasts: Incision is healing nicely with no evidence of infection or problem  Data reviewed: Pathology of the reexcision showed no residual cancer so he had negative margins.  Pressure: Doing well  Plan: I will see her in 2 months. I notified radiation therapy that she can start radiation when they're ready.

## 2012-03-10 NOTE — Patient Instructions (Signed)
See me again in about 2 months, after you have finished the radiation therapy

## 2012-03-15 ENCOUNTER — Ambulatory Visit
Admission: RE | Admit: 2012-03-15 | Discharge: 2012-03-15 | Disposition: A | Discharge status: Home / Self Care | Payer: Medicare Other | Source: Ambulatory Visit | Attending: Radiation Oncology | Admitting: Radiation Oncology

## 2012-03-15 DIAGNOSIS — C50419 Malignant neoplasm of upper-outer quadrant of unspecified female breast: Secondary | ICD-10-CM | POA: Insufficient documentation

## 2012-03-15 NOTE — Progress Notes (Signed)
Complex Simulation / treatment planning note  Diagnosis: right breast cancer  She is status post lumpectomy. She will receive whole breast radiotherapy. The patient was laid in the supine position on the treatment table with her arms over her head. Her head was in an Accuform device. I placed adhesive wiring over her lumpectomy scar and around the borders of her breast tissue . High-resolution CT axial imaging was obtained of the patient's chest. An isocenter was placed in her anterior right lung. Skin markings were made and she tolerated the procedure well without any complications.  Treatment planning note: the patient will be treated with opposed tangential fields using MLCs and wedges as needed for custom blocks and dose homogeneity respectively. I plan to prescribe 50 Gray in 25 fractions the right breast. The patient's cardiologist is aware of her treatment; we will measure the dose to her ICD on the [facility]. This has been requested from dosimetry. I anticipate that this treatment to the whole breast will be followed by a 10 Gray in 5 fraction boost to the lumpectomy cavity with photon fields.  -----------------------------------  Lonie Peak, MD

## 2012-03-17 ENCOUNTER — Encounter: Payer: Self-pay | Admitting: Internal Medicine

## 2012-03-17 ENCOUNTER — Ambulatory Visit (AMBULATORY_SURGERY_CENTER): Payer: Medicare Other

## 2012-03-17 VITALS — Ht 68.5 in | Wt 173.1 lb

## 2012-03-17 DIAGNOSIS — Z1211 Encounter for screening for malignant neoplasm of colon: Secondary | ICD-10-CM

## 2012-03-21 ENCOUNTER — Ambulatory Visit
Admission: RE | Admit: 2012-03-21 | Discharge: 2012-03-21 | Disposition: A | Discharge status: Home / Self Care | Payer: Medicare Other | Source: Ambulatory Visit | Attending: Radiation Oncology | Admitting: Radiation Oncology

## 2012-03-21 DIAGNOSIS — C50419 Malignant neoplasm of upper-outer quadrant of unspecified female breast: Secondary | ICD-10-CM

## 2012-03-22 ENCOUNTER — Ambulatory Visit
Admission: RE | Admit: 2012-03-22 | Discharge: 2012-03-22 | Disposition: A | Discharge status: Home / Self Care | Payer: Medicare Other | Source: Ambulatory Visit | Attending: Radiation Oncology | Admitting: Radiation Oncology

## 2012-03-22 NOTE — Progress Notes (Signed)
VERIFICATION SIMULATION NOTE  The patient was laid in the correct position on the treatment table for simulation verification. Portal imaging was obtained and I verified the fields and MLCs to be accurate for her right breast treatments. The patient tolerated the procedure well.  -----------------------------------------------------  Lonie Peak, MD

## 2012-03-23 ENCOUNTER — Ambulatory Visit
Admission: RE | Admit: 2012-03-23 | Discharge: 2012-03-23 | Disposition: A | Discharge status: Home / Self Care | Payer: Medicare Other | Source: Ambulatory Visit | Attending: Radiation Oncology | Admitting: Radiation Oncology

## 2012-03-24 ENCOUNTER — Ambulatory Visit
Admission: RE | Admit: 2012-03-24 | Discharge: 2012-03-24 | Disposition: A | Discharge status: Home / Self Care | Payer: Medicare Other | Source: Ambulatory Visit | Attending: Radiation Oncology | Admitting: Radiation Oncology

## 2012-03-27 ENCOUNTER — Other Ambulatory Visit: Payer: Self-pay | Admitting: *Deleted

## 2012-03-27 ENCOUNTER — Telehealth: Payer: Self-pay | Admitting: *Deleted

## 2012-03-27 ENCOUNTER — Ambulatory Visit
Admission: RE | Admit: 2012-03-27 | Discharge: 2012-03-27 | Disposition: A | Discharge status: Home / Self Care | Payer: Medicare Other | Source: Ambulatory Visit | Attending: Radiation Oncology | Admitting: Radiation Oncology

## 2012-03-27 DIAGNOSIS — Z1211 Encounter for screening for malignant neoplasm of colon: Secondary | ICD-10-CM

## 2012-03-27 NOTE — Telephone Encounter (Signed)
Message copied by Florene Glen on Mon Mar 27, 2012  8:34 AM ------      Message from: Beverley Fiedler      Created: Mon Mar 27, 2012  8:13 AM      Regarding: FW: ASA IV patient       We need to reschedule this patient at the hospital.      Anesthesia here says too sick, ASA IV.      He was direct.  We will have to look for hospital time.      Vonna Kotyk      ----- Message -----         From: Cathlyn Parsons, CRNA         Sent: 03/26/2012   5:40 PM           To: Jannifer Franklin, RN, Darrel Hoover, RN, #      Subject: ASA IV patient                                           Dr.  Rhea Belton,            [facility] you are well and had a great weekend.            The above patient was seen in pre-screening and scheduled for a colonoscopy on 03/31/12.  Unfortunately they suffer from severe CHF with an EF of 25% and an AICD.  Please review and consider provided this patient's care in the hospital setting.            Best regards,            Creta Levin, MS

## 2012-03-27 NOTE — Telephone Encounter (Signed)
Pt left me a message and then I called her and left a message to call back.

## 2012-03-27 NOTE — Telephone Encounter (Signed)
lmom for pt to call back. We need to change her COLON w/ Propofol to a hospital procedure d/t her health hx.

## 2012-03-27 NOTE — Telephone Encounter (Signed)
Scheduled pt for COLON with Propofol at [facility] on 10/8 13 at 0730am. Pt has a pre op on 10/4/13at 1pm; BOOKING # [id number]. Pt stated understanding with the dates.

## 2012-03-28 ENCOUNTER — Ambulatory Visit
Admission: RE | Admit: 2012-03-28 | Discharge: 2012-03-28 | Disposition: A | Discharge status: Home / Self Care | Payer: Medicare Other | Source: Ambulatory Visit | Attending: Radiation Oncology | Admitting: Radiation Oncology

## 2012-03-28 ENCOUNTER — Other Ambulatory Visit: Payer: Self-pay | Admitting: Medical Oncology

## 2012-03-28 DIAGNOSIS — C50919 Malignant neoplasm of unspecified site of unspecified female breast: Secondary | ICD-10-CM

## 2012-03-29 ENCOUNTER — Ambulatory Visit (HOSPITAL_BASED_OUTPATIENT_CLINIC_OR_DEPARTMENT_OTHER): Payer: Medicare Other | Admitting: Oncology

## 2012-03-29 ENCOUNTER — Other Ambulatory Visit (HOSPITAL_BASED_OUTPATIENT_CLINIC_OR_DEPARTMENT_OTHER): Payer: Medicare Other | Admitting: Lab

## 2012-03-29 ENCOUNTER — Encounter: Payer: Self-pay | Admitting: Oncology

## 2012-03-29 ENCOUNTER — Ambulatory Visit
Admission: RE | Admit: 2012-03-29 | Discharge: 2012-03-29 | Disposition: A | Discharge status: Home / Self Care | Payer: Medicare Other | Source: Ambulatory Visit | Attending: Radiation Oncology | Admitting: Radiation Oncology

## 2012-03-29 ENCOUNTER — Telehealth: Payer: Self-pay | Admitting: *Deleted

## 2012-03-29 VITALS — BP 85/51 | HR 91 | Temp 98.8°F | Resp 20 | Ht 68.5 in | Wt 175.9 lb

## 2012-03-29 DIAGNOSIS — C50419 Malignant neoplasm of upper-outer quadrant of unspecified female breast: Secondary | ICD-10-CM

## 2012-03-29 DIAGNOSIS — C50919 Malignant neoplasm of unspecified site of unspecified female breast: Secondary | ICD-10-CM

## 2012-03-29 DIAGNOSIS — C50911 Malignant neoplasm of unspecified site of right female breast: Secondary | ICD-10-CM

## 2012-03-29 DIAGNOSIS — Z170 Estrogen receptor positive status [ER+]: Secondary | ICD-10-CM

## 2012-03-29 LAB — CBC WITH DIFFERENTIAL/PLATELET
BASO%: 0.2 % (ref 0.0–2.0)
LYMPH%: 38.8 % (ref 14.0–49.7)
MCHC: 33.5 g/dL (ref 31.5–36.0)
MCV: 94.2 fL (ref 79.5–101.0)
MONO#: 0.4 10*3/uL (ref 0.1–0.9)
MONO%: 6.7 % (ref 0.0–14.0)
Platelets: 222 10*3/uL (ref 145–400)
RBC: 3.49 10*6/uL — ABNORMAL LOW (ref 3.70–5.45)
RDW: 14.5 % (ref 11.2–14.5)
WBC: 6.5 10*3/uL (ref 3.9–10.3)

## 2012-03-29 LAB — COMPREHENSIVE METABOLIC PANEL (CC13)
ALT: 11 U/L (ref 0–55)
AST: 14 U/L (ref 5–34)
Alkaline Phosphatase: 80 U/L (ref 40–150)
Potassium: 4.1 mEq/L (ref 3.5–5.1)
Sodium: 138 mEq/L (ref 136–145)
Total Bilirubin: 0.4 mg/dL (ref 0.20–1.20)
Total Protein: 6.9 g/dL (ref 6.4–8.3)

## 2012-03-29 NOTE — Patient Instructions (Addendum)
Continue with radiation therapy.  I will you back in 05/12/12.  Please call me if you have any questions and concerns

## 2012-03-29 NOTE — Progress Notes (Signed)
OFFICE PROGRESS NOTE  CC  [name], [name] Needs, FNP [address] Are @ [place] [address] Dr. Lonie Peak Dr. 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  #2 patient is referred to radiation oncology for post lumpectomy radiation. Once she completes that she will receive adjuvant antiestrogen therapy consisting of Arimidex 1 mg daily. A total of 5 years of therapy is planned.  CURRENT THERAPY: Patient is continuing radiation  INTERVAL HISTORY: Dorothy Li 66 y.o. female returns for Followup visit today. Overall she's doing well she is tolerating radiation without any significant problems. She denies any fevers chills night sweats headaches no shortness of breath or chest pains or palpitations no myalgias or arthralgias no bleeding problems. She does have some redness in the breast from the radiation. Remainder of the 10 point review of systems is negative. MEDICAL HISTORY: Past Medical History  Diagnosis Date  . HTN (hypertension)   . ETOH abuse      Elevated AST, mildly elevated INR (? component of cirrhosis). Has now quit ETOH.   [name] Hyperlipemia   . Cardiomyopathy     Echo (4/11) with EF 25%, diffuse hypokinesis, moderate LV dilation, decreased RV  systolic function but normal RV size, PA systolic pressure 35 mmHg. Lexiscan myoview (4/11): EF 29%,  diffuse hypokinesis, basal anteroseptal and apical scar, no ischemia.  The cardiomyopathy is likely due to a combination of CAD (LM stenosis) and ETOH abuse.   . VT (ventricular tachycardia)     Dual chamber Medtronic ICD placed 7/11  .  CAD (coronary artery disease)     LHC (4/11) showed 75% ostial LM stenosis with significant damping upon catheter engagement, 60-70% mLAD, 60% ostial D1, EF 30%.  Patient had CABG with LIMA-LAD and SVG-OM2.   . Carotid stenosis     carotid dopplers (4/11) with 60-79% LICA stenosis.  Carotid dopplers (10/11): 60-79%  LICA stenosis.  . Syncope     none since ICD placement  . ICD (implantable cardiac defibrillator) in place Medtronic    Interrogated on 11/18/11  . Arthritis   . Stress fracture of right foot 01/17/12    Dr. Loralee Pacas  . 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 Rash     from tape   . Nephrolithiasis   . ARF (acute renal failure)     in setting dehydration with ACE I use     ALLERGIES:  is allergic to penicillins and tape.  MEDICATIONS:  Current Outpatient Prescriptions  Medication Sig Dispense Refill  . aspirin EC 81 MG tablet Take 1 tablet (81 mg total) by mouth daily.      . Calcium Carbonate-Vitamin D (CALCIUM 600-D) 600-400 MG-UNIT per tablet Take 1 tablet by mouth 2 (two) times daily.       . carvedilol (COREG) 12.5 MG tablet Take 12.5 mg by mouth 2 (two) times daily with a meal.      . enalapril (VASOTEC) 2.5 MG tablet Take 2.5 mg by mouth 2 (two) times daily.       . hydrALAZINE (APRESOLINE) 50 MG tablet Take 1 tablet (50 mg total) by  mouth 3 (three) times daily.  90 tablet  6  . HYDROcodone-acetaminophen (NORCO/VICODIN) 5-325 MG per tablet Take 1 tablet by mouth every 4 (four) hours as needed. For pain      . isosorbide mononitrate (IMDUR) 60 MG 24 hr tablet Take 1 tablet (60 mg total) by mouth daily.  30 tablet  6  . rosuvastatin (CRESTOR) 40 MG tablet Take 1 tablet (40 mg total) by mouth daily.  90 tablet  1  . spironolactone (ALDACTONE) 25 MG tablet Take 25 mg by mouth daily before breakfast.        SURGICAL HISTORY:  Past Surgical History  Procedure Date  . Icd implantation 2011    ICD-Medtronic    Remote - No /  Hx  of VT, LU chest  . Breast biopsy 12/08/11    Right Breast, Upper Outer Quadrant: Invasive Ductal:DCIS  . Coronary artery bypass graft 4/11     x 2  . Cardiac catheterization   . Breast lumpectomy 01-19-12    right lumpectomy/  . Abdominal hysterectomy     TAH-BSO, fibroids, age 42    REVIEW OF SYSTEMS:  Pertinent items are noted in HPI.   PHYSICAL EXAMINATION: General appearance: alert, cooperative and appears stated age Resp: clear to auscultation bilaterally and normal percussion bilaterally Cardio: regular rate and rhythm, S1, S2 normal, no murmur, click, rub or gallop GI: soft, non-tender; bowel sounds normal; no masses,  no organomegaly Extremities: extremities normal, atraumatic, no cyanosis or edema Neurologic: Grossly normal Right breast: Incisional scar appears to be healing adequately presently there is no discharge minimally tender the right breast is slightly more swollen than the left breast.. ECOG PERFORMANCE STATUS: 1 - Symptomatic but completely ambulatory  Blood pressure 85/51, pulse 91, temperature 98.8 F (37.1 C), temperature source Oral, resp. rate 20, height 5' 8.5" (1.74 m), weight 175 lb 14.4 oz (79.788 kg).  LABORATORY DATA: Lab Results  Component Value Date   WBC 6.5 03/29/2012   HGB 11.0* 03/29/2012   HCT 32.9* 03/29/2012   MCV 94.2 03/29/2012   PLT 222 03/29/2012      Chemistry      Component Value Date/Time   NA 138 03/29/2012 0905   NA 137 02/14/2012 0903   K 4.1 03/29/2012 0905   K 4.7 02/14/2012 0903   CL 107 03/29/2012 0905   CL 108 02/14/2012 0903   CO2 21* 03/29/2012 0905   CO2 22 02/14/2012 0903   BUN 17.0 03/29/2012 0905   BUN 23 02/14/2012 0903   CREATININE 0.8 03/29/2012 0905   CREATININE 0.9 02/14/2012 0903      Component Value Date/Time   CALCIUM 9.4 03/29/2012 0905   CALCIUM 9.8 02/14/2012 0903   ALKPHOS 80 03/29/2012 0905   ALKPHOS 76 01/12/2012 0842   AST 14 03/29/2012 0905   AST 14 01/12/2012 0842   ALT 11 03/29/2012 0905   ALT 11  01/12/2012 0842   BILITOT 0.40 03/29/2012 0905   BILITOT 0.2* 01/12/2012 0842     FINAL DIAGNOSIS Diagnosis 1. Breast, lumpectomy, Right - WELL DIFFERENTIATED INVASIVE DUCTAL CARCINOMA, GRADE I (1.0 CM). - INVASIVE TUMOR IS FOCALLY PRESENT AT ANTERIOR MARGIN. - NO LYMPHOVASCULAR INVASION IDENTIFIED. - DUCTAL CARCINOMA IN SITU, GRADE I-II WITH COMEDO NECROSIS. - SEE TUMOR SYNOPTIC TEMPLATE BELOW. 2. Breast, excision, additional superior margin, right - BENIGN BREAST PARENCHYMA SEE COMMENT. - NO ATYPIA OR MALIGNANCY PRESENT. 3. Breast, excision, additional inferior margin, right - BENIGN BREAST WITH FIBROCYSTIC CHANGE AND USUAL DUCTAL HYPERPLASIA  SEE COMMENT. - MICROCALCIFICATIONS PRESENT IN BENIGN DUCTS AND LOBULES. - NO ATYPIA OR MALIGNANCY PRESENT. 4. Breast, excision, additional medial margin, right - BENIGN BREAST PARENCHYMA WITH FIBROCYSTIC CHANGE AND USUAL DUCT HYPERPLASIA. SEE COMMENT. - NO ATYPIA OR MALIGNANCY PRESENT. 5. Lymph node, sentinel, biopsy, Right axillary#1 - ONE LYMPH NODE, NEGATIVE FOR TUMOR (0/1). 6. Lymph node, sentinel, biopsy, Right axillary #2 - ONE LYMPH NODE, NEGATIVE FOR TUMOR (0/1). Microscopic Comment 1. BREAST, INVASIVE TUMOR, WITH LYMPH NODE SAMPLING Specimen, including laterality: Right breast. Procedure: Lumpectomy. Grade: I of III Tubule formation: 1 Nuclear pleomorphism: 1 Mitotic:1 1 of 4 FINAL for MARIJEAN, MONTANYE [phone]) Microscopic Comment(continued) Tumor size (gross measurement): 1.0 cm Margins: Invasive, distance to closest margin: anterior margin. In-situ, distance to closest margin: less than 0.1 mm (anterior) If margin positive, focally or broadly: Focally Lymphovascular invasion: Absent. Ductal carcinoma in situ: Present. Grade: I-II of III Extensive intraductal component: Absent. Lobular neoplasia: Absent. Tumor focality: Unifocal Treatment effect: None If present, treatment effect in breast tissue, lymph nodes or  both: N/A Extent of tumor: Skin: N/A Nipple: N/A Skeletal muscle: N/A Lymph nodes: # examined: 2 Lymph nodes with metastasis: 0 Breast prognostic profile: Estrogen receptor: Not repeated, previous study demonstrated 100% positivity ([id number]) Progesterone receptor: Not repeated, previous study demonstrated 98% positivity ([id number]) Her 2 neu: Repeated, previous study demonstrated no amplification ([id number]) Ki-67: Not repeated, previous study demonstrated 11% proliferation rate ([id number]) Non-neoplastic breast: Intraductal papilloma, sclerosing adenosis with calcifications, and previous biopsy site. TNM: pT1b, pN0, pMX (CRR:gt, 01/21/12) 2. The surgical resection margin(s) of the specimen were inked and microscopically evaluated. 3. The surgical resection margin(s) of the specimen were inked and microscopically evaluated. 4. The surgical resection margin(s) of the specimen were inked and microscopically evaluated.  RADIOGRAPHIC STUDIES:   ASSESSMENT: 66 year old female with:  #1 new diagnosis of stage I well-differentiated invasive ductal carcinoma of the right breast status post right lumpectomy with septal node biopsy. The final pathology revealed a 1.0 cm invasive ductal carcinoma ER +100% PR +98% HER-2/neu negative Ki-67 11% 2 sentinel nodes were negative for metastatic disease.   #2 patient is continuing radiation therapy at this time. She will complete radiation on 04/03/2012.  #3 she will then Arimidex 1 mg daily. Total of 5 years of therapy is planned.   PLAN:  #1 patient will complete radiation therapy.  #2 I will see her back on 05/12/2012 in followup and at that time she will be given a prescription for the Arimidex. Risks and benefits of treatment were discussed with the patient.  All questions were answered. The patient knows to call the clinic with any problems, questions or concerns. We can certainly see the patient much sooner if necessary.  I spent  15 minutes counseling the patient face to face. The total time spent in the appointment was 30 minutes.    Drue Second, MD Medical/Oncology [facility] [phone] (beeper) [phone] (Office)  03/29/2012, 10:17 AM

## 2012-03-29 NOTE — Telephone Encounter (Signed)
GAVE PATIENT APPOINTMENT 05-12-2012 STARTING AT 1:00PM

## 2012-03-30 ENCOUNTER — Ambulatory Visit
Admission: RE | Admit: 2012-03-30 | Discharge: 2012-03-30 | Disposition: A | Discharge status: Home / Self Care | Payer: Medicare Other | Source: Ambulatory Visit | Attending: Radiation Oncology | Admitting: Radiation Oncology

## 2012-03-30 ENCOUNTER — Encounter: Payer: Self-pay | Admitting: Radiation Oncology

## 2012-03-30 ENCOUNTER — Telehealth: Payer: Self-pay | Admitting: Cardiology

## 2012-03-30 VITALS — BP 95/55 | HR 87 | Resp 18 | Wt 175.9 lb

## 2012-03-30 DIAGNOSIS — I5022 Chronic systolic (congestive) heart failure: Secondary | ICD-10-CM

## 2012-03-30 DIAGNOSIS — C50419 Malignant neoplasm of upper-outer quadrant of unspecified female breast: Secondary | ICD-10-CM

## 2012-03-30 MED ORDER — HYDRALAZINE HCL 50 MG PO TABS: 50.0000 mg | ORAL_TABLET | Freq: Three times a day (TID) | ORAL | Status: DC

## 2012-03-30 NOTE — Progress Notes (Signed)
   Department of Radiation Oncology  Phone:  [phone] Fax:        [phone]  Weekly Treatment Note    Name: Dorothy Li Date: 03/30/2012 MRN: [id number] DOB: [birth year]   Current dose: 14 Gy  Current fraction: 7   MEDICATIONS: Current Outpatient Prescriptions  Medication Sig Dispense Refill  . aspirin EC 81 MG tablet Take 1 tablet (81 mg total) by mouth daily.      . Calcium Carbonate-Vitamin D (CALCIUM 600-D) 600-400 MG-UNIT per tablet Take 1 tablet by mouth 2 (two) times daily.       . carvedilol (COREG) 12.5 MG tablet Take 12.5 mg by mouth 2 (two) times daily with a meal.      . enalapril (VASOTEC) 2.5 MG tablet Take 2.5 mg by mouth 2 (two) times daily.       . hydrALAZINE (APRESOLINE) 50 MG tablet Take 1 tablet (50 mg total) by mouth 3 (three) times daily.  90 tablet  6  . HYDROcodone-acetaminophen (NORCO/VICODIN) 5-325 MG per tablet Take 1 tablet by mouth every 4 (four) hours as needed. For pain      . isosorbide mononitrate (IMDUR) 60 MG 24 hr tablet Take 1 tablet (60 mg total) by mouth daily.  30 tablet  6  . rosuvastatin (CRESTOR) 40 MG tablet Take 1 tablet (40 mg total) by mouth daily.  90 tablet  1  . spironolactone (ALDACTONE) 25 MG tablet Take 25 mg by mouth daily before breakfast.         ALLERGIES: Penicillins and Tape   LABORATORY DATA:  Lab Results  Component Value Date   WBC 6.5 03/29/2012   HGB 11.0* 03/29/2012   HCT 32.9* 03/29/2012   MCV 94.2 03/29/2012   PLT 222 03/29/2012   Lab Results  Component Value Date   NA 138 03/29/2012   K 4.1 03/29/2012   CL 107 03/29/2012   CO2 21* 03/29/2012   Lab Results  Component Value Date   ALT 11 03/29/2012   AST 14 03/29/2012   ALKPHOS 80 03/29/2012   BILITOT 0.40 03/29/2012     NARRATIVE: Dorothy Li was seen today for weekly treatment management. The chart was checked and the patient's films were reviewed. The patient is doing well. Energy level a little bit down but remains active. No significant skin  irritation currently.  PHYSICAL EXAMINATION: weight is 175 lb 14.4 oz (79.788 kg). Her blood pressure is 95/55 and her pulse is 87. Her respiration is 18.      mild hyperpigmentation emerging in the treatment area. Overall her skin looks good.  ASSESSMENT: The patient is doing satisfactorily with treatment.  PLAN: We will continue with the patient's radiation treatment as planned.

## 2012-03-30 NOTE — Telephone Encounter (Signed)
Pt aware prescription has been sent in to [facility].

## 2012-03-30 NOTE — Telephone Encounter (Signed)
Pt needs hydralazine refill needed for 3 month supply, said pharmacy, [facility] will not honor new prescription, pls call [phone]

## 2012-03-30 NOTE — Progress Notes (Signed)
Patient presents to the clinic today unaccompanied for PUT with Dr. Mitzi Hansen. Patient is alert and oriented to person, place, and time. No distress noted. Steady gait noted. Pleasant affect noted. Patient denies pain at this time. However, patient reports an occasional "twinge" in her right breast. Patient denies skin changes of her right/treated breast. Patient has no complaints. Reported all findings to Dr. Mitzi Hansen.

## 2012-03-31 ENCOUNTER — Ambulatory Visit
Admission: RE | Admit: 2012-03-31 | Discharge: 2012-03-31 | Disposition: A | Discharge status: Home / Self Care | Payer: Medicare Other | Source: Ambulatory Visit | Attending: Radiation Oncology | Admitting: Radiation Oncology

## 2012-03-31 ENCOUNTER — Encounter: Payer: Medicare Other | Admitting: Internal Medicine

## 2012-03-31 NOTE — Progress Notes (Signed)
Patient presented to the clinic today unaccompanied for post sim education with Sam, Charity fundraiser. Oriented patient to staff and routine of the clinic. Provided patient with RADIATION THERAPY AND YOU handbook then, reviewed pertinent information. Educated patient on potential side effects and management such as, skin changes and fatigue. Provided patient with Radiaplex gel and Alra then, directed upon use. Provided patient with Sonda Rumble, RN business card and encouraged to contact staff with needs. All questions answered. Patient verbalized understanding of all reviewed.

## 2012-04-03 ENCOUNTER — Ambulatory Visit
Admission: RE | Admit: 2012-04-03 | Discharge: 2012-04-03 | Disposition: A | Discharge status: Home / Self Care | Payer: Medicare Other | Source: Ambulatory Visit | Attending: Radiation Oncology | Admitting: Radiation Oncology

## 2012-04-03 ENCOUNTER — Encounter: Payer: Self-pay | Admitting: Radiation Oncology

## 2012-04-03 VITALS — BP 85/56 | HR 97 | Resp 18 | Wt 177.0 lb

## 2012-04-03 DIAGNOSIS — C50419 Malignant neoplasm of upper-outer quadrant of unspecified female breast: Secondary | ICD-10-CM

## 2012-04-03 NOTE — Progress Notes (Signed)
   Weekly Management Note Current Dose: 18  Gy  Projected Dose: 60 Gy   Narrative:  The patient presents for routine under treatment assessment.  CBCT/MVCT images/Port film x-rays were reviewed.  The chart was checked. Doing well. No fatigue or noted skin irritation. Uses radioplex.  Physical Findings:  weight is 177 lb (80.287 kg). Her blood pressure is 85/56 and her pulse is 97. Her respiration is 18.  Mild hyperpigmentation over the right breast  Impression:  The patient is tolerating radiotherapy.  Plan:  Continue radiotherapy as planned.  She reports that she has a colonoscopy scheduled for October 8. She is going to try to reschedule her radiotherapy to take place several hours after the colonoscopy that day. I spoke with her about the importance of trying to make every scheduled appointment for maximal efficacy of radiotherapy and she understands this. ________________________________   Lonie Peak, M.D.

## 2012-04-03 NOTE — Progress Notes (Signed)
Patient presented to the clinic today for PUT with Dr. Basilio Cairo. Patient is alert and oriented to person, place, and time. No distress noted. Steady gait noted. Pleasant affect noted. Patient denies pain at this time. Patient reports that her right breast feel a little swollen in certain areas. Patient denies skin changes. Patient reports that she continues to use Radiaplex as directed. Weight stable. Reported all findings to Dr. Basilio Cairo.

## 2012-04-04 ENCOUNTER — Ambulatory Visit
Admission: RE | Admit: 2012-04-04 | Discharge: 2012-04-04 | Disposition: A | Discharge status: Home / Self Care | Payer: Medicare Other | Source: Ambulatory Visit | Attending: Radiation Oncology | Admitting: Radiation Oncology

## 2012-04-05 ENCOUNTER — Ambulatory Visit
Admission: RE | Admit: 2012-04-05 | Discharge: 2012-04-05 | Disposition: A | Discharge status: Home / Self Care | Payer: Medicare Other | Source: Ambulatory Visit | Attending: Radiation Oncology | Admitting: Radiation Oncology

## 2012-04-06 ENCOUNTER — Ambulatory Visit
Admission: RE | Admit: 2012-04-06 | Discharge: 2012-04-06 | Disposition: A | Discharge status: Home / Self Care | Payer: Medicare Other | Source: Ambulatory Visit | Attending: Radiation Oncology | Admitting: Radiation Oncology

## 2012-04-07 ENCOUNTER — Encounter (HOSPITAL_COMMUNITY): Payer: Self-pay

## 2012-04-07 ENCOUNTER — Ambulatory Visit (HOSPITAL_COMMUNITY)
Admission: RE | Admit: 2012-04-07 | Discharge: 2012-04-07 | Disposition: A | Discharge status: Home / Self Care | Payer: Medicare Other | Source: Ambulatory Visit | Attending: Internal Medicine | Admitting: Internal Medicine

## 2012-04-07 ENCOUNTER — Ambulatory Visit
Admission: RE | Admit: 2012-04-07 | Discharge: 2012-04-07 | Disposition: A | Discharge status: Home / Self Care | Payer: Medicare Other | Source: Ambulatory Visit | Attending: Radiation Oncology | Admitting: Radiation Oncology

## 2012-04-07 NOTE — Anesthesia Preprocedure Evaluation (Addendum)
Anesthesia Evaluation  Patient identified by MRN, date of birth, ID band Patient awake    Reviewed: Allergy & Precautions, H&P , NPO status , Patient's Chart, lab work & pertinent test results  Airway Mallampati: II TM Distance: >3 FB Neck ROM: Full    Dental  (+) Missing,    Pulmonary neg pulmonary ROS,  breath sounds clear to auscultation        Cardiovascular hypertension, + CAD and + CABG negative cardio ROS  + dysrhythmias + Cardiac Defibrillator Rhythm:Regular Rate:Normal  Nonischemic cardiomyopathy   Neuro/Psych negative neurological ROS  negative psych ROS   GI/Hepatic negative GI ROS, Neg liver ROS, (+)     substance abuse (ETOH abuse in past; denies use since 2011)  alcohol use,   Endo/Other  negative endocrine ROS  Renal/GU ARFRenal disease  negative genitourinary   Musculoskeletal negative musculoskeletal ROS (+)   Abdominal   Peds  Hematology negative hematology ROS (+)   Anesthesia Other Findings   Reproductive/Obstetrics negative OB ROS                          Anesthesia Physical Anesthesia Plan  ASA: III  Anesthesia Plan: MAC   Post-op Pain Management:    Induction: Intravenous  Airway Management Planned: Mask  Additional Equipment:   Intra-op Plan:   Post-operative Plan:   Informed Consent: I have reviewed the patients History and Physical, chart, labs and discussed the procedure including the risks, benefits and alternatives for the proposed anesthesia with the patient or authorized representative who has indicated his/her understanding and acceptance.   Dental advisory given  Plan Discussed with: CRNA  Anesthesia Plan Comments:         Anesthesia Quick Evaluation

## 2012-04-10 ENCOUNTER — Ambulatory Visit
Admission: RE | Admit: 2012-04-10 | Discharge: 2012-04-10 | Disposition: A | Discharge status: Home / Self Care | Payer: Medicare Other | Source: Ambulatory Visit | Attending: Radiation Oncology | Admitting: Radiation Oncology

## 2012-04-10 ENCOUNTER — Encounter (HOSPITAL_COMMUNITY): Payer: Self-pay

## 2012-04-10 ENCOUNTER — Encounter: Payer: Self-pay | Admitting: Radiation Oncology

## 2012-04-10 VITALS — BP 127/90 | HR 92 | Temp 98.6°F | Resp 20 | Wt 176.6 lb

## 2012-04-10 DIAGNOSIS — C50419 Malignant neoplasm of upper-outer quadrant of unspecified female breast: Secondary | ICD-10-CM

## 2012-04-10 NOTE — Progress Notes (Signed)
Patient here weekly rad txs, 14/30 right breast completed hyperpigmentation only, skin intact, sometimes feel heaviness in brerast, and twinges there, no c/o pain 8:22 AM

## 2012-04-10 NOTE — Progress Notes (Signed)
   Weekly Management Note Current Dose:  28 Gy  Projected Dose: 60 Gy   Narrative:  The patient presents for routine under treatment assessment.  CBCT/MVCT images/Port film x-rays were reviewed.  The chart was checked. She is doing well. She reports a little bit of heaviness in her right breast with occasional twinges. No pain. Using radiaplex twice a day.  Physical Findings:  weight is 176 lb 9.6 oz (80.105 kg). Her oral temperature is 98.6 F (37 C). Her blood pressure is 127/90 and her pulse is 92. Her respiration is 20.  mild hyperpigmentation over the right breast. Skin is intact  Impression:  The patient is tolerating radiotherapy.  Plan:  Continue radiotherapy as planned.  ________________________________   Lonie Peak, M.D.

## 2012-04-11 ENCOUNTER — Ambulatory Visit (HOSPITAL_COMMUNITY): Payer: Medicare Other | Admitting: Anesthesiology

## 2012-04-11 ENCOUNTER — Ambulatory Visit (HOSPITAL_COMMUNITY)
Admission: RE | Admit: 2012-04-11 | Discharge: 2012-04-11 | Disposition: A | Discharge status: Home / Self Care | Payer: Medicare Other | Source: Ambulatory Visit | Attending: Internal Medicine | Admitting: Internal Medicine

## 2012-04-11 ENCOUNTER — Encounter (HOSPITAL_COMMUNITY): Payer: Self-pay | Admitting: Anesthesiology

## 2012-04-11 ENCOUNTER — Encounter (HOSPITAL_COMMUNITY): Payer: Self-pay | Admitting: Internal Medicine

## 2012-04-11 ENCOUNTER — Ambulatory Visit
Admission: RE | Admit: 2012-04-11 | Discharge: 2012-04-11 | Disposition: A | Discharge status: Home / Self Care | Payer: Medicare Other | Source: Ambulatory Visit | Attending: Radiation Oncology | Admitting: Radiation Oncology

## 2012-04-11 ENCOUNTER — Encounter (HOSPITAL_COMMUNITY): Payer: Self-pay | Admitting: *Deleted

## 2012-04-11 ENCOUNTER — Encounter (HOSPITAL_COMMUNITY)
Admission: RE | Disposition: A | Discharge status: Home / Self Care | Payer: Self-pay | Source: Ambulatory Visit | Attending: Internal Medicine

## 2012-04-11 ENCOUNTER — Encounter: Payer: Self-pay | Admitting: Radiation Oncology

## 2012-04-11 DIAGNOSIS — I10 Essential (primary) hypertension: Secondary | ICD-10-CM | POA: Insufficient documentation

## 2012-04-11 DIAGNOSIS — I428 Other cardiomyopathies: Secondary | ICD-10-CM | POA: Insufficient documentation

## 2012-04-11 DIAGNOSIS — Z95810 Presence of automatic (implantable) cardiac defibrillator: Secondary | ICD-10-CM | POA: Insufficient documentation

## 2012-04-11 DIAGNOSIS — K635 Polyp of colon: Secondary | ICD-10-CM

## 2012-04-11 DIAGNOSIS — E785 Hyperlipidemia, unspecified: Secondary | ICD-10-CM | POA: Insufficient documentation

## 2012-04-11 DIAGNOSIS — D126 Benign neoplasm of colon, unspecified: Secondary | ICD-10-CM | POA: Insufficient documentation

## 2012-04-11 DIAGNOSIS — I2510 Atherosclerotic heart disease of native coronary artery without angina pectoris: Secondary | ICD-10-CM | POA: Insufficient documentation

## 2012-04-11 DIAGNOSIS — Z853 Personal history of malignant neoplasm of breast: Secondary | ICD-10-CM | POA: Insufficient documentation

## 2012-04-11 DIAGNOSIS — K5730 Diverticulosis of large intestine without perforation or abscess without bleeding: Secondary | ICD-10-CM | POA: Insufficient documentation

## 2012-04-11 DIAGNOSIS — Z1211 Encounter for screening for malignant neoplasm of colon: Secondary | ICD-10-CM | POA: Insufficient documentation

## 2012-04-11 HISTORY — PX: COLONOSCOPY: SHX5424

## 2012-04-11 SURGERY — COLONOSCOPY
Anesthesia: Monitor Anesthesia Care

## 2012-04-11 MED ORDER — PROPOFOL 10 MG/ML IV BOLUS
INTRAVENOUS | Status: DC | PRN
  Administered 2012-04-11: 15 mg via INTRAVENOUS
  Administered 2012-04-11: 25 mg via INTRAVENOUS
  Administered 2012-04-11: 50 mg via INTRAVENOUS
  Administered 2012-04-11: 40 mg via INTRAVENOUS
  Administered 2012-04-11: 15 mg via INTRAVENOUS
  Administered 2012-04-11: 30 mg via INTRAVENOUS
  Administered 2012-04-11: 20 mg via INTRAVENOUS
  Administered 2012-04-11: 25 mg via INTRAVENOUS
  Administered 2012-04-11 (×2): 30 mg via INTRAVENOUS

## 2012-04-11 MED ORDER — MIDAZOLAM HCL 5 MG/5ML IJ SOLN
INTRAMUSCULAR | Status: DC | PRN
  Administered 2012-04-11: 2 mg via INTRAVENOUS

## 2012-04-11 MED ORDER — SODIUM CHLORIDE 0.9 % IV SOLN
INTRAVENOUS | Status: DC
  Administered 2012-04-11: 07:00:00 via INTRAVENOUS

## 2012-04-11 MED ORDER — FENTANYL CITRATE 0.05 MG/ML IJ SOLN
INTRAMUSCULAR | Status: DC | PRN
  Administered 2012-04-11: 50 ug via INTRAVENOUS

## 2012-04-11 NOTE — Transfer of Care (Signed)
Immediate Anesthesia Transfer of Care Note  Patient: Dorothy Li  Procedure(s) Performed: Procedure(s) (LRB) with comments: COLONOSCOPY (N/A)  Patient Location: PACU  Anesthesia Type: MAC  Level of Consciousness: awake, sedated and patient cooperative  Airway & Oxygen Therapy: Patient Spontanous Breathing and Patient connected to face mask oxygen  Post-op Assessment: Report given to PACU RN and Post -op Vital signs reviewed and stable  Post vital signs: Reviewed and stable  Complications: No apparent anesthesia complications

## 2012-04-11 NOTE — Op Note (Signed)
[facility] [address] [place], [address]   COLONOSCOPY PROCEDURE REPORT  PATIENT: Dorothy Li, Dorothy Li  MR#: [id number] BIRTHDATE: 02/20/1946 , 65  yrs. old GENDER: Female ENDOSCOPIST: Beverley Fiedler, MD REFERRED [id number], Padonda PROCEDURE DATE:  04/11/2012 PROCEDURE:   Colonoscopy with snare polypectomy ASA CLASS:   Class IV INDICATIONS:average risk screening and last colonoscopy performed greater than 10 yrs ago. MEDICATIONS: MAC sedation, administered by CRNA and See Anesthesia Report.  DESCRIPTION OF PROCEDURE:   After the risks benefits and alternatives of the procedure were thoroughly explained, informed consent was obtained.  A digital rectal exam revealed no rectal mass.   The     endoscope was introduced through the anus and advanced to the terminal ileum which was intubated for a short distance. No adverse events experienced.   The quality of the prep was Suprep good  The instrument was then slowly withdrawn as the colon was fully examined.   COLON FINDINGS: The mucosa appeared normal in the terminal ileum. A sessile polyp measuring 6 mm in size was found in the descending colon.  A polypectomy was performed using snare cautery.  The resection was complete and the polyp tissue was completely retrieved.   A pedunculated polyp measuring 1 cm in size was found in the sigmoid colon.  A polypectomy was performed using snare cautery.  The resection was complete and the polyp tissue was completely retrieved.   Moderate diverticulosis was noted in the sigmoid colon.  Retroflexed views revealed no abnormalities. The scope was withdrawn and the procedure completed. COMPLICATIONS: There were no complications.  ENDOSCOPIC IMPRESSION: 1.   Normal mucosa in the terminal ileum 2.   Sessile polyp measuring 6 mm in size was found in the descending colon; polypectomy was performed using snare cautery 3.   Pedunculated polyp measuring 1 cm in size was found in  the sigmoid colon; polypectomy was performed using snare cautery 4.   Moderate diverticulosis was noted in the sigmoid colon  RECOMMENDATIONS: 1.  Hold aspirin, aspirin products, and anti-inflammatory medication for 2 weeks. 2.  await pathology results 3.  Timing of repeat colonoscopy will be determined by pathology findings. 4.  You will receive a letter within 1-2 weeks with the results of your biopsy as well as final recommendations.  Please call my office if you have not received a letter after 3 weeks.  eSigned:  Beverley Fiedler, MD 04/11/2012 8:38 AM cc: Adline Mango FNP and The Patient

## 2012-04-11 NOTE — H&P (Signed)
SUBJECTIVE: HPI Dorothy Li is 66 year old female with a past medical history of CAD, CHF status post ICD placement, carotid stenosis, breast cancer in remission and hypertension who presents to [facility] for outpatient. This is for screening and the patient is without symptoms. She reports a remote colonoscopy greater than 10 years ago. She denies abdominal pain. No change in bowel habits. No blood in her stool or melena. No fevers or chills. She does have an ICD in place, but is not currently on anticoagulation or antiplatelet therapy.  Review of Systems  As per history of present illness, otherwise negative   Past Medical History  Diagnosis Date  . ETOH abuse      Elevated AST, mildly elevated INR (? component of cirrhosis). Has now quit ETOH.   [name] Hyperlipemia   . Cardiomyopathy     Echo (4/11) with EF 25%, diffuse hypokinesis, moderate LV dilation, decreased RV  systolic function but normal RV size, PA systolic pressure 35 mmHg. Lexiscan myoview (4/11): EF 29%,  diffuse hypokinesis, basal anteroseptal and apical scar, no ischemia.  The cardiomyopathy is likely due to a combination of CAD (LM stenosis) and ETOH abuse.   [name] CAD (coronary artery disease)     LHC (4/11) showed 75% ostial LM stenosis with significant damping upon catheter engagement, 60-70% mLAD, 60% ostial D1, EF 30%.  Patient had CABG with LIMA-LAD and SVG-OM2.   . Syncope     none since ICD placement  . Arthritis   . Stress fracture of right foot 01/17/12    Dr. Loralee Pacas  . Rash     from tape   . VT (ventricular tachycardia)     Dual chamber Medtronic ICD placed 7/11  . ICD (implantable cardiac defibrillator) in place Medtronic    Interrogated on 11/18/11  . Carotid stenosis     carotid dopplers (4/11) with 60-79% LICA stenosis.  Carotid dopplers (10/11): 60-79%  LICA stenosis.  . Nephrolithiasis   . ARF (acute renal failure)     in setting dehydration with ACE I use   . 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 HTN (hypertension)     Current Facility-Administered Medications  Medication Dose Route Frequency Provider Last Rate Last Dose  . 0.9 %  sodium chloride infusion   Intravenous Continuous Beverley Fiedler, MD        Allergies  Allergen Reactions  . Penicillins Other (See Comments)    "blacks out" per pt   . Tape Itching and Rash    Bandage on breast from bx caused rash    Family History  Problem Relation Age of Onset  . Heart disease Sister     Had surgery  . Heart failure Sister   . Breast cancer Cousin   . Diabetes Mother   . Colon cancer Neg Hx     History  Substance Use Topics  . Smoking status: Former Smoker -- 1.0 packs/day for 40 years    Types: Cigarettes    Quit date: 10/08/2009  . Smokeless tobacco: Not on file  . Alcohol Use: No     Prior hx 2 drinks daily.  quit 09/2009    OBJECTIVE: BP 91/61  Temp 96.5 F (35.8 C) (Temporal)  Resp 16  Ht 5' 8.5" (1.74 m)  Wt 176 lb 6 oz (80.003 kg)  BMI 26.43 kg/m2  SpO2 99% Constitutional: Well-developed and well-nourished. No distress. HEENT: Normocephalic and atraumatic. Oropharynx is clear  and moist. No oropharyngeal exudate. Conjunctivae are normal. No scleral icterus. Cardiovascular: Normal rate, regular rhythm and intact distal pulses. No M/R/G Pulmonary/chest: Effort normal and breath sounds normal. No wheezing, rales or rhonchi. Abdominal: Soft, nontender, nondistended. Bowel sounds active throughout.  Extremities: no clubbing, cyanosis, or edema Neurological: Alert and oriented to person place and time. Skin: Skin is warm and dry. No rashes noted. Psychiatric: Normal mood and affect. Behavior is normal.  Labs  CBC    Component Value Date/Time   WBC 6.5 03/29/2012 0905   WBC 6.1 02/14/2012 0903   RBC 3.49* 03/29/2012 0905   RBC 3.60* 02/14/2012 0903   HGB 11.0* 03/29/2012 0905   HGB 11.0* 02/14/2012 0903   HCT 32.9* 03/29/2012 0905   HCT 33.4* 02/14/2012 0903   PLT 222  03/29/2012 0905   PLT 230.0 02/14/2012 0903   MCV 94.2 03/29/2012 0905   MCV 92.9 02/14/2012 0903   MCH 31.6 03/29/2012 0905   MCH 30.1 01/12/2012 0842   MCHC 33.5 03/29/2012 0905   MCHC 33.0 02/14/2012 0903   RDW 14.5 03/29/2012 0905   RDW 14.8* 02/14/2012 0903   LYMPHSABS 2.5 03/29/2012 0905   LYMPHSABS 2.5 02/14/2012 0903   MONOABS 0.4 03/29/2012 0905   MONOABS 0.3 02/14/2012 0903   EOSABS 0.4 03/29/2012 0905   EOSABS 0.5 02/14/2012 0903   BASOSABS 0.0 03/29/2012 0905   BASOSABS 0.0 02/14/2012 0903    CMP     Component Value Date/Time   NA 138 03/29/2012 0905   NA 137 02/14/2012 0903   K 4.1 03/29/2012 0905   K 4.7 02/14/2012 0903   CL 107 03/29/2012 0905   CL 108 02/14/2012 0903   CO2 21* 03/29/2012 0905   CO2 22 02/14/2012 0903   GLUCOSE 88 03/29/2012 0905   GLUCOSE 98 02/14/2012 0903   BUN 17.0 03/29/2012 0905   BUN 23 02/14/2012 0903   CREATININE 0.8 03/29/2012 0905   CREATININE 0.9 02/14/2012 0903   CALCIUM 9.4 03/29/2012 0905   CALCIUM 9.8 02/14/2012 0903   PROT 6.9 03/29/2012 0905   PROT 8.0 01/12/2012 0842   ALBUMIN 3.8 03/29/2012 0905   ALBUMIN 4.2 01/12/2012 0842   AST 14 03/29/2012 0905   AST 14 01/12/2012 0842   ALT 11 03/29/2012 0905   ALT 11 01/12/2012 0842   ALKPHOS 80 03/29/2012 0905   ALKPHOS 76 01/12/2012 0842   BILITOT 0.40 03/29/2012 0905   BILITOT 0.2* 01/12/2012 0842   GFRNONAA 86* 01/12/2012 0842   GFRAA >90 01/12/2012 0842    ASSESSMENT AND PLAN: 66 year old female with multiple medical comorbidities presenting for screening colonoscopy  1.  CRC screening -- the patient is average risk, but due to her medical comorbidities the decision was made to perform her screening colonoscopy in the hospital setting as an outpatient. The procedure will be performed with MAC sedation.  The nature of the procedure, as well as the risks, benefits, and alternatives were carefully and thoroughly reviewed with the patient. Ample time for discussion and questions allowed. The patient understood,  was satisfied, and agreed to proceed.

## 2012-04-11 NOTE — Anesthesia Postprocedure Evaluation (Signed)
  Anesthesia Post-op Note  Patient: Dorothy Li  Procedure(s) Performed: Procedure(s) (LRB): COLONOSCOPY (N/A)  Patient Location: PACU  Anesthesia Type: MAC  Level of Consciousness: awake and alert   Airway and Oxygen Therapy: Patient Spontanous Breathing  Post-op Pain: mild  Post-op Assessment: Post-op Vital signs reviewed, Patient's Cardiovascular Status Stable, Respiratory Function Stable, Patent Airway and No signs of Nausea or vomiting  Post-op Vital Signs: stable  Complications: No apparent anesthesia complications

## 2012-04-12 ENCOUNTER — Encounter (HOSPITAL_COMMUNITY): Payer: Self-pay | Admitting: Internal Medicine

## 2012-04-12 ENCOUNTER — Ambulatory Visit
Admission: RE | Admit: 2012-04-12 | Discharge: 2012-04-12 | Disposition: A | Discharge status: Home / Self Care | Payer: Medicare Other | Source: Ambulatory Visit | Attending: Radiation Oncology | Admitting: Radiation Oncology

## 2012-04-13 ENCOUNTER — Encounter: Payer: Self-pay | Admitting: Internal Medicine

## 2012-04-13 ENCOUNTER — Ambulatory Visit
Admission: RE | Admit: 2012-04-13 | Discharge: 2012-04-13 | Disposition: A | Discharge status: Home / Self Care | Payer: Medicare Other | Source: Ambulatory Visit | Attending: Radiation Oncology | Admitting: Radiation Oncology

## 2012-04-14 ENCOUNTER — Ambulatory Visit
Admission: RE | Admit: 2012-04-14 | Discharge: 2012-04-14 | Disposition: A | Discharge status: Home / Self Care | Payer: Medicare Other | Source: Ambulatory Visit | Attending: Radiation Oncology | Admitting: Radiation Oncology

## 2012-04-14 DIAGNOSIS — C50419 Malignant neoplasm of upper-outer quadrant of unspecified female breast: Secondary | ICD-10-CM

## 2012-04-14 MED ORDER — RADIAPLEXRX EX GEL
Freq: Once | CUTANEOUS | Status: AC
  Administered 2012-04-14: 1 via TOPICAL

## 2012-04-17 ENCOUNTER — Encounter: Payer: Medicare Other | Admitting: Family

## 2012-04-17 ENCOUNTER — Ambulatory Visit
Admission: RE | Admit: 2012-04-17 | Discharge: 2012-04-17 | Disposition: A | Discharge status: Home / Self Care | Payer: Medicare Other | Source: Ambulatory Visit | Attending: Radiation Oncology | Admitting: Radiation Oncology

## 2012-04-17 ENCOUNTER — Encounter: Payer: Self-pay | Admitting: Radiation Oncology

## 2012-04-17 VITALS — BP 87/52 | HR 89 | Temp 98.5°F | Wt 175.8 lb

## 2012-04-17 DIAGNOSIS — C50419 Malignant neoplasm of upper-outer quadrant of unspecified female breast: Secondary | ICD-10-CM

## 2012-04-17 NOTE — Patient Instructions (Signed)
Please discuss your blood pressure with your primary doctor. It is running low.

## 2012-04-17 NOTE — Progress Notes (Signed)
   Weekly Management Note Current Dose:   38Gy  Projected Dose: 60 Gy   Narrative:  The patient presents for routine under treatment assessment.  CBCT/MVCT images/Port film x-rays were reviewed.  The chart was checked. A little tired.  Skin darker.  Physical Findings:  weight is 175 lb 12.8 oz (79.742 kg). Her temperature is 98.5 F (36.9 C). Her blood pressure is 87/52 and her pulse is 89.   Diffuse hyperpigmentation over right breast. Skin intact.  Impression:  The patient is tolerating radiotherapy.  Plan:  Continue radiotherapy as planned.  ________________________________   Lonie Peak, M.D.

## 2012-04-17 NOTE — Progress Notes (Signed)
Ms Boehler for assess of right breast following her 32 th fraction of radiation.  Denies any pain but states she has an ocassional twinge.  Hyperpigmentation noted but skin intact throughout her treatment field.   States her energy level has changed "some" and she has to take a break during her exercise class.

## 2012-04-18 ENCOUNTER — Ambulatory Visit
Admission: RE | Admit: 2012-04-18 | Discharge: 2012-04-18 | Disposition: A | Discharge status: Home / Self Care | Payer: Medicare Other | Source: Ambulatory Visit | Attending: Radiation Oncology | Admitting: Radiation Oncology

## 2012-04-18 VITALS — BP 107/69 | HR 102 | Temp 98.4°F | Resp 16

## 2012-04-18 DIAGNOSIS — C50419 Malignant neoplasm of upper-outer quadrant of unspecified female breast: Secondary | ICD-10-CM

## 2012-04-18 NOTE — Progress Notes (Signed)
Patient came around  To nursing, after rad tx right breast, took ortho vitals, still low but asymptomatic, stated patient, "I've always had low b/p, no c/o pin

## 2012-04-19 ENCOUNTER — Ambulatory Visit (INDEPENDENT_AMBULATORY_CARE_PROVIDER_SITE_OTHER): Payer: Medicare Other | Admitting: Family

## 2012-04-19 ENCOUNTER — Encounter: Payer: Self-pay | Admitting: Family

## 2012-04-19 ENCOUNTER — Ambulatory Visit
Admission: RE | Admit: 2012-04-19 | Discharge: 2012-04-19 | Disposition: A | Discharge status: Home / Self Care | Payer: Medicare Other | Source: Ambulatory Visit | Attending: Radiation Oncology | Admitting: Radiation Oncology

## 2012-04-19 VITALS — BP 98/60 | Temp 98.3°F | Ht 68.0 in | Wt 174.0 lb

## 2012-04-19 DIAGNOSIS — E7800 Pure hypercholesterolemia, unspecified: Secondary | ICD-10-CM

## 2012-04-19 DIAGNOSIS — Z0000 Encounter for general adult medical examination without abnormal findings: Secondary | ICD-10-CM

## 2012-04-19 DIAGNOSIS — I10 Essential (primary) hypertension: Secondary | ICD-10-CM

## 2012-04-19 DIAGNOSIS — Z23 Encounter for immunization: Secondary | ICD-10-CM

## 2012-04-19 LAB — CBC WITH DIFFERENTIAL/PLATELET
Basophils Relative: 0.9 % (ref 0.0–3.0)
Eosinophils Absolute: 0.3 10*3/uL (ref 0.0–0.7)
Eosinophils Relative: 6 % — ABNORMAL HIGH (ref 0.0–5.0)
Lymphocytes Relative: 31.5 % (ref 12.0–46.0)
Neutrophils Relative %: 53.7 % (ref 43.0–77.0)
Platelets: 219 10*3/uL (ref 150.0–400.0)
RBC: 3.9 Mil/uL (ref 3.87–5.11)
WBC: 5.3 10*3/uL (ref 4.5–10.5)

## 2012-04-19 LAB — LIPID PANEL
HDL: 53.2 mg/dL (ref >39.00)
Triglycerides: 72 mg/dL (ref 0.0–149.0)
VLDL: 14.4 mg/dL (ref 0.0–40.0)

## 2012-04-19 LAB — BASIC METABOLIC PANEL
BUN: 17 mg/dL (ref 6–23)
Chloride: 108 mEq/L (ref 96–112)
GFR: 93.65 mL/min (ref >60.00)
Glucose, Bld: 86 mg/dL (ref 70–99)
Potassium: 4.7 mEq/L (ref 3.5–5.1)
Sodium: 139 mEq/L (ref 135–145)

## 2012-04-19 LAB — TSH: TSH: 0.7 u[IU]/mL (ref 0.35–5.50)

## 2012-04-19 NOTE — Progress Notes (Signed)
Subjective:    Patient ID: Dorothy Li, female    DOB: 01/14/1946, 66 y.o.   MRN: [id number]  HPI 66 year old African American female, nonsmoker is in for her initial welcome to Medicare complete physical exam. Denies any concerns. Patient's undergone therapy for breast carcinoma. She has had a colonoscopy screening did show precancerous cells she is to have an repeat colonoscopy in 3 years.  This is a routine physical examination for this healthy  Female. Reviewed all health maintenance protocols including mammography colonoscopy bone density and reviewed appropriate screening labs. Her immunization history was reviewed as well as her current medications and allergies refills of her chronic medications were given and the plan for yearly health maintenance was discussed all orders and referrals were made as appropriate.  Review of Systems  Constitutional: Negative.   HENT: Negative.   Eyes: Negative.   Respiratory: Negative.   Cardiovascular: Negative.   Gastrointestinal: Negative.   Genitourinary: Negative.   Musculoskeletal: Negative.   Skin: Negative.   Neurological: Negative.   Hematological: Negative.   Psychiatric/Behavioral: Negative.    Past Medical History  Diagnosis Date  . ETOH abuse      Elevated AST, mildly elevated INR (? component of cirrhosis). Has now quit ETOH.   [name] Hyperlipemia   . Cardiomyopathy     Echo (4/11) with EF 25%, diffuse hypokinesis, moderate LV dilation, decreased RV  systolic function but normal RV size, PA systolic pressure 35 mmHg. Lexiscan myoview (4/11): EF 29%,  diffuse hypokinesis, basal anteroseptal and apical scar, no ischemia.  The cardiomyopathy is likely due to a combination of CAD (LM stenosis) and ETOH abuse.   [name] CAD (coronary artery disease)     LHC (4/11) showed 75% ostial LM stenosis with significant damping upon catheter engagement, 60-70% mLAD, 60% ostial D1, EF 30%.  Patient had CABG with LIMA-LAD and SVG-OM2.   . Syncope     none  since ICD placement  . Arthritis   . Stress fracture of right foot 01/17/12    Dr. Loralee Pacas  . Rash     from tape   . VT (ventricular tachycardia)     Dual chamber Medtronic ICD placed 7/11  . ICD (implantable cardiac defibrillator) in place Medtronic    Interrogated on 11/18/11  . Carotid stenosis     carotid dopplers (4/11) with 60-79% LICA stenosis.  Carotid dopplers (10/11): 60-79%  LICA stenosis.  . Nephrolithiasis   . ARF (acute renal failure)     in setting dehydration with ACE I use   . 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 HTN (hypertension)     History   Social History  . Marital Status: Married    Spouse Name: N/A    Number of Children: N/A  . Years of Education: N/A   Occupational History  . Not on file.   Social History Main Topics  . Smoking status: Former Smoker -- 1.0 packs/day for 40 years    Types: Cigarettes    Quit date: 10/08/2009  . Smokeless tobacco: Not on file  . Alcohol Use: No     Prior hx 2 drinks daily.  quit 09/2009  . Drug Use: No  . Sexually Active: Not Currently     menses age [age], G78, 3 miscarriages,no HRT   Other Topics Concern  . 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 Married, lives in [place]  smoking 4/11Prior moderate ETOH use (2 liquor drinks daily) but quit in 3/11. No drug use and negative urine drug screen in 2/11    Past Surgical History  Procedure Date  . Icd implantation 2011    ICD-Medtronic    Remote - No /  Hx of VT, LU chest  . Breast biopsy 12/08/11    Right Breast, Upper Outer Quadrant: Invasive Ductal:DCIS  . Coronary artery bypass graft 4/11     x 2  . Cardiac catheterization   . Breast lumpectomy 01-19-12    right lumpectomy/  . Abdominal hysterectomy     TAH-BSO, fibroids, age 45  . Colonoscopy 04/11/2012    Procedure: COLONOSCOPY;  Surgeon: Beverley Fiedler, MD;  Location: [facility] ENDOSCOPY;  Service: Gastroenterology;   Laterality: N/A;    Family History  Problem Relation Age of Onset  . Heart disease Sister     Had surgery  . Heart failure Sister   . Breast cancer Cousin   . Diabetes Mother   . Colon cancer Neg Hx     Allergies  Allergen Reactions  . Penicillins Other (See Comments)    "blacks out" per pt   . Tape Itching and Rash    Bandage on breast from bx caused rash    Current Outpatient Prescriptions on File Prior to Visit  Medication Sig Dispense Refill  . aspirin EC 81 MG tablet Take 1 tablet (81 mg total) by mouth daily.      . Calcium Carbonate-Vitamin D (CALCIUM 600-D) 600-400 MG-UNIT per tablet Take 1 tablet by mouth 2 (two) times daily.       . carvedilol (COREG) 12.5 MG tablet Take 12.5 mg by mouth 2 (two) times daily with a meal.      . enalapril (VASOTEC) 2.5 MG tablet Take 2.5 mg by mouth 2 (two) times daily.       . hydrALAZINE (APRESOLINE) 50 MG tablet Take 1 tablet (50 mg total) by mouth 3 (three) times daily.  270 tablet  3  . HYDROcodone-acetaminophen (NORCO/VICODIN) 5-325 MG per tablet Take 1 tablet by mouth every 4 (four) hours as needed. For pain      . isosorbide mononitrate (IMDUR) 60 MG 24 hr tablet Take 1 tablet (60 mg total) by mouth daily.  30 tablet  6  . non-metallic deodorant (ALRA) MISC Apply 1 application topically daily as needed.      . rosuvastatin (CRESTOR) 40 MG tablet Take 1 tablet (40 mg total) by mouth daily.  90 tablet  1  . spironolactone (ALDACTONE) 25 MG tablet Take 25 mg by mouth daily before breakfast.      . Wound Cleansers (RADIAPLEX EX) Apply topically.        BP 98/60  Temp 98.3 F (36.8 C) (Oral)  Ht 5\' 8"  (1.727 m)  Wt 174 lb (78.926 kg)  BMI 26.46 kg/m2chart    Objective:   Physical Exam  Constitutional: She is oriented to person, place, and time. She appears well-developed and well-nourished.  HENT:  Head: Normocephalic and atraumatic.  Right Ear: External ear normal.  Left Ear: External ear normal.  Nose: Nose normal.    Mouth/Throat: Oropharynx is clear and moist.  Eyes: Conjunctivae normal and EOM are normal. Pupils are equal, round, and reactive to light.  Neck: Normal range of motion. Neck supple. No thyromegaly present.  Cardiovascular: Normal rate, regular rhythm, normal heart sounds and intact distal pulses.   No murmur heard. Pulmonary/Chest: Effort normal and breath sounds normal.  She has no wheezes.  Abdominal: Soft. Bowel sounds are normal. There is no tenderness. There is no rebound and no guarding.  Genitourinary:       Deferred patient had a complete hysterectomy  Musculoskeletal: Normal range of motion. She exhibits no edema.  Neurological: She is alert and oriented to person, place, and time. She has normal reflexes. No cranial nerve deficit. Coordination normal.  Skin: Skin is warm and dry.  Psychiatric: She has a normal mood and affect.      EKG: Normal sinus rhythm nonspecific ST changes.    Visual acuity noted. Assessment & Plan:  Assessment: Welcome to Medicare complete physical exam, hypertension, breast carcinoma  Plan: Lab sent to include BMP, lipids, CBC, TSH notify patient pending results. Administered Pneumovax, tetanus, and flu shot today.

## 2012-04-20 ENCOUNTER — Ambulatory Visit
Admission: RE | Admit: 2012-04-20 | Discharge: 2012-04-20 | Disposition: A | Discharge status: Home / Self Care | Payer: Medicare Other | Source: Ambulatory Visit | Attending: Radiation Oncology | Admitting: Radiation Oncology

## 2012-04-21 ENCOUNTER — Ambulatory Visit
Admission: RE | Admit: 2012-04-21 | Discharge: 2012-04-21 | Disposition: A | Discharge status: Home / Self Care | Payer: Medicare Other | Source: Ambulatory Visit | Attending: Radiation Oncology | Admitting: Radiation Oncology

## 2012-04-21 NOTE — Progress Notes (Signed)
Pt in nursing for BP check. BP 85/58. She states her BP "has been running low since Aug 2012". Pt states she will contact her cardiologist, Dr Shirlee Latch and inform him of BP today. Pt denies dizziness.

## 2012-04-24 ENCOUNTER — Encounter: Payer: Self-pay | Admitting: Radiation Oncology

## 2012-04-24 ENCOUNTER — Ambulatory Visit
Admission: RE | Admit: 2012-04-24 | Discharge: 2012-04-24 | Disposition: A | Discharge status: Home / Self Care | Payer: Medicare Other | Source: Ambulatory Visit | Attending: Radiation Oncology | Admitting: Radiation Oncology

## 2012-04-24 VITALS — BP 83/51 | HR 86 | Temp 98.6°F | Resp 18

## 2012-04-24 DIAGNOSIS — C50911 Malignant neoplasm of unspecified site of right female breast: Secondary | ICD-10-CM

## 2012-04-24 NOTE — Progress Notes (Signed)
Weekly Management Note:  Site: Right breast Current Dose:  4800  cGy Projected Dose: 6000  cGy  Narrative: The patient is seen today for routine under treatment assessment. CBCT/MVCT images/port films were reviewed. The chart was reviewed.   No new complaints today. She uses Radioplex gel.  Physical Examination:  Filed Vitals:   04/24/12 0837  BP: 83/51  Pulse: 86  Temp: 98.6 F (37 C)  Resp: 18  .  Weight:  . There is moderate to marked hyperpigmentation the skin with patchy dry desquamation along the lower axilla and inframammary region. No areas of moist desquamation.  Impression: Tolerating radiation therapy well.  Plan: Continue radiation therapy as planned.

## 2012-04-24 NOTE — Progress Notes (Signed)
Patient presents to the clinic today for PUT with Dr. Dayton Scrape. Patient alert and oriented to person, place, and time. No distress noted. Steady gait noted. Pleasant affect noted. Patient denies pain at this time. Patient reports her energy level is ok but, she has noticed that she has to stop and sit down to take a rest break during her exercise program. Hyperpigmentation of right breast without desquamation noted. Patient reports using Radiaplex tid as directed by Dr. Basilio Cairo. Reported all findings to Dr. Dayton Scrape.

## 2012-04-25 ENCOUNTER — Ambulatory Visit
Admission: RE | Admit: 2012-04-25 | Discharge: 2012-04-25 | Disposition: A | Discharge status: Home / Self Care | Payer: Medicare Other | Source: Ambulatory Visit | Attending: Radiation Oncology | Admitting: Radiation Oncology

## 2012-04-26 ENCOUNTER — Ambulatory Visit: Payer: Medicare Other

## 2012-04-26 ENCOUNTER — Ambulatory Visit
Admission: RE | Admit: 2012-04-26 | Discharge: 2012-04-26 | Disposition: A | Discharge status: Home / Self Care | Payer: Medicare Other | Source: Ambulatory Visit | Attending: Radiation Oncology | Admitting: Radiation Oncology

## 2012-04-26 DIAGNOSIS — C50419 Malignant neoplasm of upper-outer quadrant of unspecified female breast: Secondary | ICD-10-CM

## 2012-04-26 MED ORDER — RADIAPLEXRX EX GEL
Freq: Once | CUTANEOUS | Status: AC
  Administered 2012-04-26: 1 via TOPICAL

## 2012-04-27 ENCOUNTER — Ambulatory Visit: Payer: Medicare Other

## 2012-04-27 ENCOUNTER — Encounter: Payer: Self-pay | Admitting: Radiation Oncology

## 2012-04-27 ENCOUNTER — Ambulatory Visit
Admission: RE | Admit: 2012-04-27 | Discharge: 2012-04-27 | Disposition: A | Discharge status: Home / Self Care | Payer: Medicare Other | Source: Ambulatory Visit | Attending: Radiation Oncology | Admitting: Radiation Oncology

## 2012-04-27 NOTE — Progress Notes (Signed)
Simulation verification note: The patient underwent simulation verification for her right breast boost on 04/25/2012. Her isocenter was in good position and the multileaf collimators contoured the treatment volume appropriately.

## 2012-04-28 ENCOUNTER — Ambulatory Visit
Admission: RE | Admit: 2012-04-28 | Discharge: 2012-04-28 | Disposition: A | Discharge status: Home / Self Care | Payer: Medicare Other | Source: Ambulatory Visit | Attending: Radiation Oncology | Admitting: Radiation Oncology

## 2012-04-28 ENCOUNTER — Ambulatory Visit: Payer: Medicare Other

## 2012-05-01 ENCOUNTER — Ambulatory Visit
Admission: RE | Admit: 2012-05-01 | Discharge: 2012-05-01 | Disposition: A | Discharge status: Home / Self Care | Payer: Medicare Other | Source: Ambulatory Visit | Attending: Radiation Oncology | Admitting: Radiation Oncology

## 2012-05-01 ENCOUNTER — Ambulatory Visit: Payer: Medicare Other

## 2012-05-01 ENCOUNTER — Encounter: Payer: Self-pay | Admitting: Radiation Oncology

## 2012-05-01 VITALS — BP 93/50 | HR 86 | Temp 99.0°F | Wt 173.7 lb

## 2012-05-01 DIAGNOSIS — C50419 Malignant neoplasm of upper-outer quadrant of unspecified female breast: Secondary | ICD-10-CM

## 2012-05-01 MED ORDER — BIAFINE EX EMUL
CUTANEOUS | Status: DC | PRN
  Administered 2012-05-01: 1 via TOPICAL

## 2012-05-01 NOTE — Progress Notes (Signed)
After assessment by Dr. Basilio Cairo a Hydrogel pad was applied to the inframmary fold with instructions on how to apply and given a box to take home and  Explained that the hydrogel pad protects the area and promotes moist wound healing .  Stretch netting to hold pad in place since Dorothy Li is not wearing a bra.    Changed Radiaplex gel to Biafine as ordered by Dr. Basilio Cairo with instructions to apply twice daily after treatment and at bedtime.

## 2012-05-01 NOTE — Progress Notes (Signed)
Intermittent burning sensation in inframmary fold on right breast with tenderness in the axilla.   Note start of desquamation in the inframmary fold and Hyperpigmentation of entire treatment field..  Grades discomfort as a level 2 on a scale of 0-10.   She notes some fatigue presently

## 2012-05-01 NOTE — Progress Notes (Signed)
   Weekly Management Note Current Dose:   58Gy  Projected Dose:  60Gy   Narrative:  The patient presents for routine under treatment assessment.  CBCT/MVCT images/Port film x-rays were reviewed.  The chart was checked. Reports skin irritation over her right breast. Also has some fatigue.  Physical Findings:  weight is 173 lb 11.2 oz (78.79 kg). Her temperature is 99 F (37.2 C). Her blood pressure is 93/50 and her pulse is 86.  right breast is diffusely hyperpigmented. She has some small patches dry desquamation in the inframammary fold  Impression:  The patient is tolerating radiotherapy.  Plan:  Continue radiotherapy as planned. We will start Biafine over her skin /hydrogel pads as needed. Followup in one month  ________________________________   Lonie Peak, M.D.

## 2012-05-02 ENCOUNTER — Encounter: Payer: Self-pay | Admitting: Radiation Oncology

## 2012-05-02 ENCOUNTER — Ambulatory Visit
Admission: RE | Admit: 2012-05-02 | Discharge: 2012-05-02 | Disposition: A | Discharge status: Home / Self Care | Payer: Medicare Other | Source: Ambulatory Visit | Attending: Radiation Oncology | Admitting: Radiation Oncology

## 2012-05-02 ENCOUNTER — Encounter (INDEPENDENT_AMBULATORY_CARE_PROVIDER_SITE_OTHER): Payer: Medicare Other | Admitting: Surgery

## 2012-05-02 ENCOUNTER — Ambulatory Visit: Payer: Medicare Other

## 2012-05-03 ENCOUNTER — Ambulatory Visit: Payer: Medicare Other

## 2012-05-04 ENCOUNTER — Ambulatory Visit: Payer: Medicare Other

## 2012-05-05 ENCOUNTER — Ambulatory Visit: Payer: Medicare Other

## 2012-05-11 ENCOUNTER — Telehealth: Payer: Self-pay | Admitting: *Deleted

## 2012-05-11 NOTE — Telephone Encounter (Signed)
Returned patient's call.  She is requesting More Biafine for her dry skin on the right breast.  She denies any moist desquamation or draining areas and also reports that her inframmary fold is intact.  She continues to use hydrogel pads in the inframmary fold for comfort. Will send Aquaphor via mail for her to use.  In the meantime suggested she use lotion without fragrance and or light layer of Vaseline on top of breast not underneath her breast.  She stated understanding.

## 2012-05-12 ENCOUNTER — Encounter (INDEPENDENT_AMBULATORY_CARE_PROVIDER_SITE_OTHER): Payer: Self-pay | Admitting: Surgery

## 2012-05-12 ENCOUNTER — Ambulatory Visit (INDEPENDENT_AMBULATORY_CARE_PROVIDER_SITE_OTHER): Payer: Medicare Other | Admitting: Surgery

## 2012-05-12 ENCOUNTER — Ambulatory Visit (HOSPITAL_BASED_OUTPATIENT_CLINIC_OR_DEPARTMENT_OTHER): Payer: Medicare Other | Admitting: Oncology

## 2012-05-12 ENCOUNTER — Telehealth: Payer: Self-pay | Admitting: *Deleted

## 2012-05-12 ENCOUNTER — Encounter: Payer: Self-pay | Admitting: Oncology

## 2012-05-12 ENCOUNTER — Other Ambulatory Visit (HOSPITAL_BASED_OUTPATIENT_CLINIC_OR_DEPARTMENT_OTHER): Payer: Medicare Other | Admitting: Lab

## 2012-05-12 VITALS — BP 80/52 | HR 78 | Temp 97.8°F | Resp 20 | Ht 68.0 in | Wt 173.0 lb

## 2012-05-12 VITALS — BP 106/63 | HR 105 | Temp 98.7°F | Resp 20 | Ht 68.0 in | Wt 176.8 lb

## 2012-05-12 DIAGNOSIS — C50919 Malignant neoplasm of unspecified site of unspecified female breast: Secondary | ICD-10-CM

## 2012-05-12 DIAGNOSIS — Z170 Estrogen receptor positive status [ER+]: Secondary | ICD-10-CM

## 2012-05-12 DIAGNOSIS — C50419 Malignant neoplasm of upper-outer quadrant of unspecified female breast: Secondary | ICD-10-CM

## 2012-05-12 DIAGNOSIS — Z79811 Long term (current) use of aromatase inhibitors: Secondary | ICD-10-CM

## 2012-05-12 DIAGNOSIS — C50911 Malignant neoplasm of unspecified site of right female breast: Secondary | ICD-10-CM

## 2012-05-12 HISTORY — DX: Long term (current) use of aromatase inhibitors: Z79.811

## 2012-05-12 LAB — COMPREHENSIVE METABOLIC PANEL (CC13)
ALT: 12 U/L (ref 0–55)
AST: 14 U/L (ref 5–34)
Alkaline Phosphatase: 72 U/L (ref 40–150)
Creatinine: 1 mg/dL (ref 0.6–1.1)
Sodium: 138 mEq/L (ref 136–145)
Total Bilirubin: 0.26 mg/dL (ref 0.20–1.20)
Total Protein: 7.1 g/dL (ref 6.4–8.3)

## 2012-05-12 LAB — CBC WITH DIFFERENTIAL/PLATELET
BASO%: 2.1 % — ABNORMAL HIGH (ref 0.0–2.0)
EOS%: 5.6 % (ref 0.0–7.0)
HCT: 33.2 % — ABNORMAL LOW (ref 34.8–46.6)
LYMPH%: 29.4 % (ref 14.0–49.7)
MCH: 31.3 pg (ref 25.1–34.0)
MCHC: 33 g/dL (ref 31.5–36.0)
MONO#: 0.5 10*3/uL (ref 0.1–0.9)
MONO%: 8.3 % (ref 0.0–14.0)
NEUT%: 54.6 % (ref 38.4–76.8)
Platelets: 196 10*3/uL (ref 145–400)
RBC: 3.49 10*6/uL — ABNORMAL LOW (ref 3.70–5.45)
WBC: 5.7 10*3/uL (ref 3.9–10.3)

## 2012-05-12 MED ORDER — ANASTROZOLE 1 MG PO TABS: 1.0000 mg | ORAL_TABLET | Freq: Every day | ORAL | Status: AC

## 2012-05-12 NOTE — Patient Instructions (Signed)
See me again in six months 

## 2012-05-12 NOTE — Progress Notes (Signed)
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 She has no problems or concerns on either side.She just completed radiation.She sees Dr Welton Flakes today to discuss anti-estrogen  PFSH: She has had no significant changes since the last visit here.  ROS: There have been no significant changes since the last visit here  EXAM:  VS: BP 80/52  Pulse 78  Temp 97.8 F (36.6 C)  Resp 20  Ht 5\' 8"  (1.727 m)  Wt 173 lb (78.472 kg)  BMI 26.30 kg/m2  General: The patient is alert, oriented, generally healthy appearing, NAD. Mood and affect are normal.  Breasts:  Right breast shows acute radiation change with hyperpigmentation and some edema. The lumpectomy site is slightly firm, otherwise no problems. Left breast is normal  Lymphatics: She has no axillary or supraclavicular adenopathy on either side.  Extremities: Full ROM of the surgical side with no lymphedema noted.  Data Reviewed: Epic notes  Impression: Doing well  Plan: RTC six months

## 2012-05-12 NOTE — Telephone Encounter (Signed)
Gave patient appointment for 07-2012 

## 2012-05-12 NOTE — Progress Notes (Signed)
OFFICE PROGRESS NOTE  CC  [name], [name] Needs, FNP [address] [place] @ [place] [address] Dr. Lonie Peak Dr. 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  #2 patient has now completed radiation therapy on 05/02/2012. Overall she's tolerated it well.  #3 she will now begin adjuvant antiestrogen therapy with Arimidex 1 mg daily starting 05/12/2012. Total of 5 years of therapy is planned.  CURRENT THERAPY: Arimidex 1 mg daily  INTERVAL HISTORY: LUWANNA BROSSMAN 66 y.o. female returns for Followup visit today. She has now completed radiation she did develop some moist desquamation and skin changes. This is now healing quite nicely. She does have some fatigue. But she denies any nausea vomiting fevers chills night sweats headaches no shortness of breath no chest pains no palpitations she has not noticed any masses in her breasts. Remainder of the 10 point review of systems is negative.   MEDICAL HISTORY: Past Medical History  Diagnosis Date  . ETOH abuse      Elevated AST, mildly elevated INR (? component of cirrhosis). Has now quit ETOH.   [name] Hyperlipemia   . Cardiomyopathy     Echo (4/11) with EF 25%, diffuse hypokinesis, moderate LV dilation, decreased RV  systolic function but normal RV size, PA systolic pressure 35 mmHg. Lexiscan myoview (4/11): EF 29%,  diffuse hypokinesis, basal anteroseptal and apical scar, no ischemia.  The cardiomyopathy is likely due to a combination of CAD (LM stenosis) and ETOH abuse.   [name] CAD (coronary artery disease)     LHC (4/11) showed 75% ostial LM stenosis  with significant damping upon catheter engagement, 60-70% mLAD, 60% ostial D1, EF 30%.  Patient had CABG with LIMA-LAD and SVG-OM2.   . Syncope     none since ICD placement  . Arthritis   . Stress fracture of right foot 01/17/12    Dr. Loralee Pacas  . Rash     from tape   . VT (ventricular tachycardia)     Dual chamber Medtronic ICD placed 7/11  . ICD (implantable cardiac defibrillator) in place Medtronic    Interrogated on 11/18/11  . Carotid stenosis     carotid dopplers (4/11) with 60-79% LICA stenosis.  Carotid dopplers (10/11): 60-79%  LICA stenosis.  . Nephrolithiasis   . ARF (acute renal failure)     in setting dehydration with ACE I use   . 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 HTN (hypertension)     ALLERGIES:  is allergic to penicillins and tape.  MEDICATIONS:  Current Outpatient Prescriptions  Medication Sig Dispense Refill  . aspirin EC 81 MG tablet Take 1 tablet (81 mg total) by mouth daily.      . Calcium Carbonate-Vitamin D (CALCIUM 600-D) 600-400 MG-UNIT per tablet Take 1 tablet by mouth 2 (two) times daily.       . carvedilol (COREG) 12.5 MG tablet Take 12.5 mg by mouth 2 (two) times daily with a meal.      . enalapril (VASOTEC) 2.5 MG tablet Take 2.5 mg by mouth 2 (two) times daily.       . hydrALAZINE (  APRESOLINE) 50 MG tablet Take 1 tablet (50 mg total) by mouth 3 (three) times daily.  270 tablet  3  . HYDROcodone-acetaminophen (NORCO/VICODIN) 5-325 MG per tablet Take 1 tablet by mouth every 4 (four) hours as needed. For pain      . isosorbide mononitrate (IMDUR) 60 MG 24 hr tablet Take 1 tablet (60 mg total) by mouth daily.  30 tablet  6  . non-metallic deodorant (ALRA) MISC Apply 1 application topically daily as needed.      . rosuvastatin (CRESTOR) 40 MG tablet Take 1 tablet (40 mg total) by mouth daily.  90 tablet  1  . spironolactone (ALDACTONE) 25 MG tablet Take 25 mg by mouth daily before breakfast.      . Wound  Cleansers (RADIAPLEX EX) Apply topically.        SURGICAL HISTORY:  Past Surgical History  Procedure Date  . Icd implantation 2011    ICD-Medtronic    Remote - No /  Hx of VT, LU chest  . Breast biopsy 12/08/11    Right Breast, Upper Outer Quadrant: Invasive Ductal:DCIS  . Coronary artery bypass graft 4/11     x 2  . Cardiac catheterization   . Breast lumpectomy 01-19-12    right lumpectomy/  . Abdominal hysterectomy     TAH-BSO, fibroids, age 59  . Colonoscopy 04/11/2012    Procedure: COLONOSCOPY;  Surgeon: Beverley Fiedler, MD;  Location: [facility];  Service: Gastroenterology;  Laterality: N/A;    REVIEW OF SYSTEMS:  Pertinent items are noted in HPI.   PHYSICAL EXAMINATION: General appearance: alert, cooperative and appears stated age Resp: clear to auscultation bilaterally and normal percussion bilaterally Cardio: regular rate and rhythm, S1, S2 normal, no murmur, click, rub or gallop GI: soft, non-tender; bowel sounds normal; no masses,  no organomegaly Extremities: extremities normal, atraumatic, no cyanosis or edema Neurologic: Grossly normal Right breast: Incisional scar appears to be healing adequately presently there is no discharge minimally tender she is noted to have darkening of the skin with some lighter areas do to skin desquamation. the right breast is slightly more swollen than the left breast.. ECOG PERFORMANCE STATUS: 1 - Symptomatic but completely ambulatory  Blood pressure 106/63, pulse 105, temperature 98.7 F (37.1 C), resp. rate 20, height 5\' 8"  (1.727 m), weight 176 lb 12.8 oz (80.196 kg).  LABORATORY DATA: Lab Results  Component Value Date   WBC 5.7 05/12/2012   HGB 10.9* 05/12/2012   HCT 33.2* 05/12/2012   MCV 95.1 05/12/2012   PLT 196 05/12/2012      Chemistry      Component Value Date/Time   NA 139 04/19/2012 0954   NA 138 03/29/2012 0905   K 4.7 04/19/2012 0954   K 4.1 03/29/2012 0905   CL 108 04/19/2012 0954   CL 107 03/29/2012 0905   CO2 26  04/19/2012 0954   CO2 21* 03/29/2012 0905   BUN 17 04/19/2012 0954   BUN 17.0 03/29/2012 0905   CREATININE 0.8 04/19/2012 0954   CREATININE 0.8 03/29/2012 0905      Component Value Date/Time   CALCIUM 9.4 04/19/2012 0954   CALCIUM 9.4 03/29/2012 0905   ALKPHOS 80 03/29/2012 0905   ALKPHOS 76 01/12/2012 0842   AST 14 03/29/2012 0905   AST 14 01/12/2012 0842   ALT 11 03/29/2012 0905   ALT 11 01/12/2012 0842   BILITOT 0.40 03/29/2012 0905   BILITOT 0.2* 01/12/2012 0842     FINAL DIAGNOSIS Diagnosis 1.  Breast, lumpectomy, Right - WELL DIFFERENTIATED INVASIVE DUCTAL CARCINOMA, GRADE I (1.0 CM). - INVASIVE TUMOR IS FOCALLY PRESENT AT ANTERIOR MARGIN. - NO LYMPHOVASCULAR INVASION IDENTIFIED. - DUCTAL CARCINOMA IN SITU, GRADE I-II WITH COMEDO NECROSIS. - SEE TUMOR SYNOPTIC TEMPLATE BELOW. 2. Breast, excision, additional superior margin, right - BENIGN BREAST PARENCHYMA SEE COMMENT. - NO ATYPIA OR MALIGNANCY PRESENT. 3. Breast, excision, additional inferior margin, right - BENIGN BREAST WITH FIBROCYSTIC CHANGE AND USUAL DUCTAL HYPERPLASIA SEE COMMENT. - MICROCALCIFICATIONS PRESENT IN BENIGN DUCTS AND LOBULES. - NO ATYPIA OR MALIGNANCY PRESENT. 4. Breast, excision, additional medial margin, right - BENIGN BREAST PARENCHYMA WITH FIBROCYSTIC CHANGE AND USUAL DUCT HYPERPLASIA. SEE COMMENT. - NO ATYPIA OR MALIGNANCY PRESENT. 5. Lymph node, sentinel, biopsy, Right axillary#1 - ONE LYMPH NODE, NEGATIVE FOR TUMOR (0/1). 6. Lymph node, sentinel, biopsy, Right axillary #2 - ONE LYMPH NODE, NEGATIVE FOR TUMOR (0/1). Microscopic Comment 1. BREAST, INVASIVE TUMOR, WITH LYMPH NODE SAMPLING Specimen, including laterality: Right breast. Procedure: Lumpectomy. Grade: I of III Tubule formation: 1 Nuclear pleomorphism: 1 Mitotic:1 1 of 4 FINAL for VERONDA, GABOR [phone]) Microscopic Comment(continued) Tumor size (gross measurement): 1.0 cm Margins: Invasive, distance to closest margin: anterior  margin. In-situ, distance to closest margin: less than 0.1 mm (anterior) If margin positive, focally or broadly: Focally Lymphovascular invasion: Absent. Ductal carcinoma in situ: Present. Grade: I-II of III Extensive intraductal component: Absent. Lobular neoplasia: Absent. Tumor focality: Unifocal Treatment effect: None If present, treatment effect in breast tissue, lymph nodes or both: N/A Extent of tumor: Skin: N/A Nipple: N/A Skeletal muscle: N/A Lymph nodes: # examined: 2 Lymph nodes with metastasis: 0 Breast prognostic profile: Estrogen receptor: Not repeated, previous study demonstrated 100% positivity ([id number]) Progesterone receptor: Not repeated, previous study demonstrated 98% positivity ([id number]) Her 2 neu: Repeated, previous study demonstrated no amplification ([id number]) Ki-67: Not repeated, previous study demonstrated 11% proliferation rate ([id number]) Non-neoplastic breast: Intraductal papilloma, sclerosing adenosis with calcifications, and previous biopsy site. TNM: pT1b, pN0, pMX (CRR:gt, 01/21/12) 2. The surgical resection margin(s) of the specimen were inked and microscopically evaluated. 3. The surgical resection margin(s) of the specimen were inked and microscopically evaluated. 4. The surgical resection margin(s) of the specimen were inked and microscopically evaluated.  RADIOGRAPHIC STUDIES:   ASSESSMENT: 66 year old female with:  #1 new diagnosis of stage I well-differentiated invasive ductal carcinoma of the right breast status post right lumpectomy with septal node biopsy. The final pathology revealed a 1.0 cm invasive ductal carcinoma ER +100% PR +98% HER-2/neu negative Ki-67 11% 2 sentinel nodes were negative for metastatic disease.   #2 patient is s/p radiation therapy at this time. She  completed radiation on 04/03/2012.  #3 she will begin Arimidex 1 mg daily. Total of 5 years of therapy is planned.   PLAN:  #1 patient will proceed  with Arimidex 1 mg daily risks and benefits of treatment were discussed with the patient. She understands that she will be taking this on a daily basis for the next 5 years. Literature was given to her.  #2 I will plan on seeing her back in 3 months time or sooner if need arises.  All questions were answered. The patient knows to call the clinic with any problems, questions or concerns. We can certainly see the patient much sooner if necessary.  I spent 25 minutes counseling the patient face to face. The total time spent in the appointment was 30 minutes.    Drue Second, MD Medical/Oncology [facility] [phone] (beeper) [phone] (Office)  05/12/2012, 1:46 PM

## 2012-05-12 NOTE — Patient Instructions (Addendum)
Proceed with arimidex 1 mg daily  I will see you back in 3 months  Anastrozole tablets What is this medicine? ANASTROZOLE (an AS troe zole) is used to treat breast cancer in women who have gone through menopause. Some types of breast cancer depend on estrogen to grow, and this medicine can stop tumor growth by blocking estrogen production. This medicine may be used for other purposes; ask your health care provider or pharmacist if you have questions. What should I tell my health care provider before I take this medicine? They need to know if you have any of these conditions: -liver disease -an unusual or allergic reaction to anastrozole, other medicines, foods, dyes, or preservatives -pregnant or trying to get pregnant -breast-feeding How should I use this medicine? Take this medicine by mouth with a glass of water. Follow the directions on the prescription label. You can take this medicine with or without food. Take your doses at regular intervals. Do not take your medicine more often than directed. Do not stop taking except on the advice of your doctor or health care professional. Talk to your pediatrician regarding the use of this medicine in children. Special care may be needed. Overdosage: If you think you have taken too much of this medicine contact a poison control center or emergency room at once. NOTE: This medicine is only for you. Do not share this medicine with others. What if I miss a dose? If you miss a dose, take it as soon as you can. If it is almost time for your next dose, take only that dose. Do not take double or extra doses. What may interact with this medicine? Do not take this medicine with any of the following medications: -female hormones, like estrogens or progestins and birth control pills This medicine may also interact with the following medications: -tamoxifen This list may not describe all possible interactions. Give your health care provider a list of all the  medicines, herbs, non-prescription drugs, or dietary supplements you use. Also tell them if you smoke, drink alcohol, or use illegal drugs. Some items may interact with your medicine. What should I watch for while using this medicine? Visit your doctor or health care professional for regular checks on your progress. Let your doctor or health care professional know about any unusual vaginal bleeding. Do not treat yourself for diarrhea, nausea, vomiting or other side effects. Ask your doctor or health care professional for advice. What side effects may I notice from receiving this medicine? Side effects that you should report to your doctor or health care professional as soon as possible: -allergic reactions like skin rash, itching or hives, swelling of the face, lips, or tongue -any new or unusual symptoms -breathing problems -chest pain -leg pain or swelling -vomiting Side effects that usually do not require medical attention (report to your doctor or health care professional if they continue or are bothersome): -back or bone pain -cough, or throat infection -diarrhea or constipation -dizziness -headache -hot flashes -loss of appetite -nausea -sweating -weakness and tiredness -weight gain This list may not describe all possible side effects. Call your doctor for medical advice about side effects. You may report side effects to FDA at 1-800-FDA-1088. Where should I keep my medicine? Keep out of the reach of children. Store at room temperature between 20 and 25 degrees C (68 and 77 degrees F). Throw away any unused medicine after the expiration date. NOTE: This sheet is a summary. It may not cover all possible information.  If you have questions about this medicine, talk to your doctor, pharmacist, or health care provider.  2012, [employer]vier/Gold Standard. (09/01/2007 4:31:52 PM2

## 2012-05-22 NOTE — Progress Notes (Signed)
Photon Boost Treatment Planning Note  Diagnosis: Breast Cancer  The patient's CT images from her simulation were reviewed to plan her boost treatment to her right breast  lumpectomy cavity.  The boost to the lumpectomy cavity will be delivered with 2 photon fields using MLCs for custom blocks with 6 MV and 10 MV photon energy. 10 Gy in 5 fractions prescribed.  -----------------------------------  Dorothy Peak, MD

## 2012-05-22 NOTE — Progress Notes (Signed)
[facility] Radiation Oncology End of Treatment Note  Name:Dorothy Li  Date: 05/02/2012 WUJ:[id number] DOB:1945/10/08   Status:outpatient    DIAGNOSIS: Pathologic stage T1bN0 clinical M0 right breast cancer, grade 1 invasive ductal carcinoma, ER/PR positive HER-2/neu negative    INDICATION FOR TREATMENT: Curative   TREATMENT DATES: 03/22/2012 through 05/02/2012                         SITE/DOSE:        1) right breast/ 50 gray in 25 fractions 2) right breast boost/ 10 gray in 5 fraction                     BEAMS/ENERGY:          1) opposed tangents / 10 MV photons        2) 2 field photon boost  /6 and 10 MV photons  NARRATIVE:  She tolerated treatment well. She developed some patches of dry desquamation as well as diffuse hyperpigmentation over her breast.                         PLAN: Routine followup in one month. Patient instructed to call if questions or worsening complaints in interim.

## 2012-05-26 ENCOUNTER — Encounter: Payer: Self-pay | Admitting: Radiation Oncology

## 2012-05-26 ENCOUNTER — Ambulatory Visit
Admission: RE | Admit: 2012-05-26 | Discharge: 2012-05-26 | Disposition: A | Discharge status: Home / Self Care | Payer: Medicare Other | Source: Ambulatory Visit | Attending: Radiation Oncology | Admitting: Radiation Oncology

## 2012-05-26 VITALS — BP 101/59 | Temp 98.1°F | Wt 178.0 lb

## 2012-05-26 DIAGNOSIS — C50419 Malignant neoplasm of upper-outer quadrant of unspecified female breast: Secondary | ICD-10-CM

## 2012-05-26 HISTORY — DX: Personal history of irradiation: Z92.3

## 2012-05-26 HISTORY — DX: Long term (current) use of aromatase inhibitors: Z79.811

## 2012-05-26 NOTE — Progress Notes (Signed)
  Radiation Oncology         [phone] ________________________________  Name: Dorothy Li MRN: [id number]  Date: 05/26/2012  DOB: 1945/11/17  Follow-Up Visit Note  CC: [name], PADONDA BOYD, FNP  Victorino December, MD  Diagnosis:  Pathologic stage T1bN0 clinical M0 right breast cancer, grade 1 invasive ductal carcinoma, ER/PR positive HER-2/neu negative  Interval Since Last Radiation:  Completed 60 Gy in 30 fractions on 05-02-12  Narrative:  The patient returns today for routine follow-up.  Doing well. She has no complaints other than some persistent tenderness of the right nipple.                       She is taking vitamin D and Arimidex as prescribed by Dr. Welton Flakes.  ALLERGIES:  is allergic to penicillins and tape.  Meds: Current Outpatient Prescriptions  Medication Sig Dispense Refill  . anastrozole (ARIMIDEX) 1 MG tablet Take 1 tablet (1 mg total) by mouth daily.  90 tablet  12  . aspirin EC 81 MG tablet Take 1 tablet (81 mg total) by mouth daily.      . Calcium Carbonate-Vitamin D (CALCIUM 600-D) 600-400 MG-UNIT per tablet Take 1 tablet by mouth 2 (two) times daily.       . carvedilol (COREG) 12.5 MG tablet Take 12.5 mg by mouth 2 (two) times daily with a meal.      . Cholecalciferol (VITAMIN D3) 3000 UNITS TABS Take by mouth. Taking Vitamin D3 2000 units once daily      . enalapril (VASOTEC) 2.5 MG tablet Take 2.5 mg by mouth 2 (two) times daily.       . hydrALAZINE (APRESOLINE) 50 MG tablet Take 1 tablet (50 mg total) by mouth 3 (three) times daily.  270 tablet  3  . isosorbide mononitrate (IMDUR) 60 MG 24 hr tablet Take 1 tablet (60 mg total) by mouth daily.  30 tablet  6  . rosuvastatin (CRESTOR) 40 MG tablet Take 1 tablet (40 mg total) by mouth daily.  90 tablet  1  . spironolactone (ALDACTONE) 25 MG tablet Take 25 mg by mouth daily before breakfast.        Physical Findings: The patient is in no acute distress. Patient is alert and oriented.  weight is 178 lb (80.74  kg). Her temperature is 98.1 F (36.7 C). Her blood pressure is 101/59. .  No significant changes. Right breast has healed remarkably well. There is a little residual edema. Minimal hyperpigmentation. Right nipple is tender to touch  Lab Findings: Lab Results  Component Value Date   WBC 5.7 05/12/2012   HGB 10.9* 05/12/2012   HCT 33.2* 05/12/2012   MCV 95.1 05/12/2012   PLT 196 05/12/2012     Radiographic Findings: No results found.  Impression/Plan: She has recovered well from radiotherapy to her right breast. I told her she can start using vitamin E lotion or vitamin E. oil over her breast for the next couple months to continue the healing process. I told her it may take several months for the nipple to no longer be sore. I encouraged her to continue with yearly mammography and followup with medical oncology. I will see her back on an as-needed basis. I have encouraged her to call if she has any issues or concerns in the future. I wished her the very best.  _____________________________________   Lonie Peak, MD

## 2012-05-26 NOTE — Progress Notes (Signed)
FU today.  No voiced complaints of pain but does report tenderness of nipple on the right breast.  Skin tact and soft with mild hyperpigmentation of right breast.

## 2012-05-29 ENCOUNTER — Ambulatory Visit (INDEPENDENT_AMBULATORY_CARE_PROVIDER_SITE_OTHER): Payer: Medicare Other | Admitting: *Deleted

## 2012-05-29 ENCOUNTER — Other Ambulatory Visit (INDEPENDENT_AMBULATORY_CARE_PROVIDER_SITE_OTHER): Payer: Medicare Other

## 2012-05-29 ENCOUNTER — Encounter: Payer: Self-pay | Admitting: Internal Medicine

## 2012-05-29 DIAGNOSIS — I428 Other cardiomyopathies: Secondary | ICD-10-CM

## 2012-05-29 DIAGNOSIS — I5022 Chronic systolic (congestive) heart failure: Secondary | ICD-10-CM

## 2012-05-29 DIAGNOSIS — C50911 Malignant neoplasm of unspecified site of right female breast: Secondary | ICD-10-CM

## 2012-05-29 LAB — ICD DEVICE OBSERVATION
AL IMPEDENCE ICD: 475 Ohm
CHARGE TIME: 9.499 s
PACEART VT: 0
RV LEAD AMPLITUDE: 4.25 mv
TOT-0001: 1
TOT-0002: 0
TOT-0006: 20110701000000
TZAT-0001ATACH: 1
TZAT-0001ATACH: 3
TZAT-0001FASTVT: 1
TZAT-0002ATACH: NEGATIVE
TZAT-0002FASTVT: NEGATIVE
TZAT-0004SLOWVT: 8
TZAT-0012ATACH: 150 ms
TZAT-0012ATACH: 150 ms
TZAT-0012SLOWVT: 200 ms
TZAT-0012SLOWVT: 200 ms
TZAT-0013SLOWVT: 2
TZAT-0013SLOWVT: 2
TZAT-0018ATACH: NEGATIVE
TZAT-0018SLOWVT: NEGATIVE
TZAT-0019ATACH: 6 V
TZAT-0019SLOWVT: 8 V
TZAT-0019SLOWVT: 8 V
TZAT-0020ATACH: 1.5 ms
TZAT-0020SLOWVT: 1.5 ms
TZAT-0020SLOWVT: 1.5 ms
TZON-0003ATACH: 350 ms
TZON-0003FASTVT: 240 ms
TZON-0003SLOWVT: 320 ms
TZON-0003VSLOWVT: 450 ms
TZON-0004SLOWVT: 24
TZON-0005SLOWVT: 12
TZST-0001ATACH: 4
TZST-0001ATACH: 6
TZST-0001FASTVT: 2
TZST-0001FASTVT: 4
TZST-0001SLOWVT: 5
TZST-0002ATACH: NEGATIVE
TZST-0002ATACH: NEGATIVE
TZST-0002FASTVT: NEGATIVE
TZST-0002FASTVT: NEGATIVE
TZST-0003SLOWVT: 35 J
TZST-0003SLOWVT: 35 J
VENTRICULAR PACING ICD: 0.01 pct

## 2012-05-29 LAB — BASIC METABOLIC PANEL
BUN: 27 mg/dL — ABNORMAL HIGH (ref 6–23)
CO2: 20 mEq/L (ref 19–32)
Calcium: 9.4 mg/dL (ref 8.4–10.5)
Creatinine, Ser: 1 mg/dL (ref 0.4–1.2)
GFR: 73.87 mL/min (ref >60.00)
Glucose, Bld: 112 mg/dL — ABNORMAL HIGH (ref 70–99)
Sodium: 135 mEq/L (ref 135–145)

## 2012-05-29 NOTE — Patient Instructions (Addendum)
Return office visit 08/21/12@9 :00am with the device clinic.

## 2012-05-29 NOTE — Progress Notes (Signed)
ICD check with ICM 

## 2012-06-05 ENCOUNTER — Ambulatory Visit (INDEPENDENT_AMBULATORY_CARE_PROVIDER_SITE_OTHER): Payer: Medicare Other | Admitting: Cardiology

## 2012-06-05 ENCOUNTER — Encounter: Payer: Self-pay | Admitting: Cardiology

## 2012-06-05 VITALS — BP 110/58 | HR 114 | Ht 68.0 in | Wt 174.0 lb

## 2012-06-05 DIAGNOSIS — E7800 Pure hypercholesterolemia, unspecified: Secondary | ICD-10-CM

## 2012-06-05 DIAGNOSIS — I6529 Occlusion and stenosis of unspecified carotid artery: Secondary | ICD-10-CM

## 2012-06-05 DIAGNOSIS — I5022 Chronic systolic (congestive) heart failure: Secondary | ICD-10-CM

## 2012-06-05 DIAGNOSIS — I25810 Atherosclerosis of coronary artery bypass graft(s) without angina pectoris: Secondary | ICD-10-CM

## 2012-06-05 MED ORDER — SPIRONOLACTONE 25 MG PO TABS: 25.0000 mg | ORAL_TABLET | Freq: Every day | ORAL | Status: DC

## 2012-06-05 MED ORDER — CARVEDILOL 12.5 MG PO TABS: ORAL_TABLET | ORAL | Status: DC

## 2012-06-05 NOTE — Patient Instructions (Addendum)
Increase coreg(carvedilol) to 18.75 mg two times a day. This will be 1 and 1/2 tablets of your 12.5mg  (total 18.75mg ) two times a day.  Your physician recommends that you schedule a follow-up appointment in: 3 months with Dr Shirlee Latch.  Your physician recommends that you return for lab work in: 3 months when you see Dr Frutoso Chase.

## 2012-06-05 NOTE — Progress Notes (Signed)
Patient ID: Dorothy Li, female   DOB: 1946/03/03, 66 y.o.   MRN: [id number] PCP: Adline Mango  66 yo with history of cardiomyopathy s/p ICD, and CAD s/p CABG presents for followup.   Patient had a left heart cath in 4/11 which showed a 75% ostial left main stenosis. She had CABG x 2.  She has moderate to severe LV systolic dysfunction that I think is a mixed picture, due to both CAD and probably ETOH abuse.  Since her CABG, she has quit smoking and quit drinking ETOH. She had syncope and episodes of VT on monitor, so Medtronic ICD was placed in 7/11.  Last summer, patient had an episode of profound dehydration with renal failure (house not air conditioned, poor po intake with use of Lasix).  This resolved with rehydration.  This summer, she had an air conditioner installed and has not had any problem with dehydration.  She was diagnosed with breast cancer this year and had a lumpectomy followed by radiation.   Dorothy Li has been doing well symptomatically since I last saw her.  No exertional dyspnea or chest pain.  No lightheadedness.  No orthopnea or PND.  Weight is stable.  She has been off Lasix.  HR is slightly high today but rhythm is regular so appears to be in NSR.   Optivol checked today.  Fluid index below threshold and impedance trending up.   Labs (8/11): BNP 181 Labs (9/11): K 4.4, creatinine 0.6 Labs (10/11): BNP 95 Labs (11/11): BNP 65, creatinine 0.8, K 4.7, LDL 86, HDL 55 Labs (12/11): K 4.5, creatinine 0.7 Labs (1/12): LDL 69, HDL 41 Labs (6/12): K 4.9, creatinine 0.97, LDL 82, HDL 45 Labs (7/12): creatinine 3.36 => 2.57 Labs (9/12): K 3.7, creatinine 0.9, BNP 65 Labs (10/12): K 4.4, creatinine 0.8 Labs (2/13): K 4.3, creatinine 0.82, LDL 77, HDL 45 Labs (5/13): K 4.1, creatinine 1.0 Labs (8/13): K 4.7, creatinine 0.9, LDL 51, HDL 60 Labs (11/13): K 4.6, creatinine 1.0  Allergies:  1)  ! Pcn  Past Medical History: 1. HTN 2. ETOH abuse: Elevated AST, mildly elevated  INR (? component of cirrhosis). Has now quit ETOH.  3. History of ARF in setting of dehydration/ACEI in the summer of 2011 and again in 2012.  4. Hyperlipidemia 5. Nephrolithiasis 6. Cardiomyopathy: Echo (4/11) with EF 25%, diffuse hypokinesis, moderate LV dilation, decreased RV systolic function but normal RV size, PA systolic pressure 35 mmHg. Lexiscan myoview (4/11): EF 29%, diffuse hypokinesis, basal anteroseptal and apical scar, no ischemia.  The cardiomyopathy is likely due to a combination of CAD (LM stenosis) and ETOH abuse.  ANA and SPEP were negative.  Echo (6/11): EF 25%, mild diastolic dysfunction, normal RV, no pulmonary HTN 7. VT: Dual chamber Medtronic ICD placed 7/11 8. CAD: LHC (4/11) showed 75% ostial LM stenosis with significant damping upon catheter engagement, 60-70% mLAD, 60% ostial D1, EF 30%.  Patient had CABG with LIMA-LAD and SVG-OM2.  9. carotid stenosis: carotid dopplers (4/11) with 60-79% LICA stenosis.  Carotid dopplers (10/11): 60-79% LICA stenosis.  Carotids (4/12): 60-79% LICA.  Carotids (11/12): 60-79% LICA.   Carotids (5/13): 60-79% LICA.  10.  Syncope 11. Breast cancer: Right lumpectomy in 7/13 followed by radiation.    Family History: Sister with heart surgery, she is not sure why.   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 Married, lives in [place] Quit smoking 4/11 Prior moderate ETOH use (2 liquor drinks daily) but quit in 3/11.  No drug use and negative urine drug screen in 2/11  Review of Systems        All systems reviewed and negative except as per HPI.   Current Outpatient Prescriptions  Medication Sig Dispense Refill  . anastrozole (ARIMIDEX) 1 MG tablet Take 1 tablet (1 mg total) by mouth daily.  90 tablet  12  . aspirin EC 81 MG tablet Take 1 tablet (81 mg total) by mouth daily.      . Calcium Carbonate-Vitamin D (CALCIUM 600-D) 600-400 MG-UNIT per tablet Take 1 tablet by mouth 2 (two) times daily.       . carvedilol  (COREG) 12.5 MG tablet Take 12.5 mg by mouth 2 (two) times daily with a meal.      . Cholecalciferol (VITAMIN D3) 3000 UNITS TABS Take 2,000 Units by mouth. Taking Vitamin D3 2000 units once daily      . enalapril (VASOTEC) 2.5 MG tablet Take 2.5 mg by mouth 2 (two) times daily.       . hydrALAZINE (APRESOLINE) 50 MG tablet Take 1 tablet (50 mg total) by mouth 3 (three) times daily.  270 tablet  3  . isosorbide mononitrate (IMDUR) 60 MG 24 hr tablet Take 1 tablet (60 mg total) by mouth daily.  30 tablet  6  . rosuvastatin (CRESTOR) 40 MG tablet Take 1 tablet (40 mg total) by mouth daily.  90 tablet  1  . spironolactone (ALDACTONE) 25 MG tablet Take 25 mg by mouth daily before breakfast.        BP 110/58  Pulse 114  Ht 5\' 8"  (1.727 m)  Wt 174 lb (78.926 kg)  BMI 26.46 kg/m2  SpO2 98% General:  Well developed, well nourished, in no acute distress. Neck:  Neck supple, no JVD. No masses, thyromegaly or abnormal cervical nodes. Lungs:  Clear bilaterally to auscultation and percussion. Heart:  Non-displaced PMI, chest non-tender; regular rate and rhythm, S1, S2 without murmurs, rubs. +S4. Carotid upstroke normal, no bruit.  Pedals normal pulses. No edema, no varicosities. Abdomen:  Bowel sounds positive; abdomen soft and non-tender without masses, organomegaly, or hernias noted. No hepatosplenomegaly. Extremities:  No clubbing or cyanosis. Neurologic:  Alert and oriented x 3. Psych:  Normal affect.  Assessment/Plan:  CHRONIC SYSTOLIC HEART FAILURE  Doing well symptomatically, NYHA class II. She is not volume overloaded on exam and seems to be doing fine off Lasix.  Optivol looks ok, impedance trending up.  - Continue current enalapril, hydralazine/Imdur, and spironolactone. I will not increase enalapril further at this time, has had ARF in the past with ACEI increase (in association with dehydration).  - Increase Coreg to 18.75 mg bid.  - Continue exercising at [facility].  - BMET at followup in 3  months.  CORONARY ATHEROSLERO UNSPEC TYPE BYPASS GRAFT  Stable with no ischemic symptoms. Continue ASA 81, beta blocker, ACEI, statin. CAROTID ARTERY STENOSIS Moderate LICA stenosis, repeat carotids in 5/14. PURE HYPERCHOLESTEROLEMIA Good LDL on Crestor.   Marca Ancona 06/05/2012 8:57 AM

## 2012-06-22 ENCOUNTER — Other Ambulatory Visit: Payer: Self-pay | Admitting: Cardiology

## 2012-06-26 ENCOUNTER — Encounter: Payer: Self-pay | Admitting: Cardiology

## 2012-07-24 ENCOUNTER — Telehealth: Payer: Self-pay | Admitting: Oncology

## 2012-07-24 ENCOUNTER — Encounter: Payer: Self-pay | Admitting: Oncology

## 2012-07-24 ENCOUNTER — Ambulatory Visit: Payer: Medicare Other | Admitting: Lab

## 2012-07-24 ENCOUNTER — Ambulatory Visit (HOSPITAL_BASED_OUTPATIENT_CLINIC_OR_DEPARTMENT_OTHER): Payer: Medicare Other | Admitting: Oncology

## 2012-07-24 ENCOUNTER — Other Ambulatory Visit (HOSPITAL_BASED_OUTPATIENT_CLINIC_OR_DEPARTMENT_OTHER): Payer: Medicare Other | Admitting: Lab

## 2012-07-24 ENCOUNTER — Other Ambulatory Visit: Payer: Self-pay | Admitting: Medical Oncology

## 2012-07-24 VITALS — BP 91/62 | HR 115 | Temp 98.4°F | Resp 20 | Wt 171.8 lb

## 2012-07-24 DIAGNOSIS — C50919 Malignant neoplasm of unspecified site of unspecified female breast: Secondary | ICD-10-CM

## 2012-07-24 DIAGNOSIS — C50911 Malignant neoplasm of unspecified site of right female breast: Secondary | ICD-10-CM

## 2012-07-24 DIAGNOSIS — C50419 Malignant neoplasm of upper-outer quadrant of unspecified female breast: Secondary | ICD-10-CM

## 2012-07-24 DIAGNOSIS — Z170 Estrogen receptor positive status [ER+]: Secondary | ICD-10-CM

## 2012-07-24 LAB — COMPREHENSIVE METABOLIC PANEL (CC13)
AST: 13 U/L (ref 5–34)
Albumin: 3.8 g/dL (ref 3.5–5.0)
Alkaline Phosphatase: 74 U/L (ref 40–150)
BUN: 19 mg/dL (ref 7.0–26.0)
Potassium: 4.3 mEq/L (ref 3.5–5.1)
Sodium: 139 mEq/L (ref 136–145)
Total Bilirubin: 0.39 mg/dL (ref 0.20–1.20)

## 2012-07-24 LAB — CBC WITH DIFFERENTIAL/PLATELET
Basophils Absolute: 0.1 10*3/uL (ref 0.0–0.1)
EOS%: 3.5 % (ref 0.0–7.0)
LYMPH%: 29.1 % (ref 14.0–49.7)
MCH: 30.4 pg (ref 25.1–34.0)
MCV: 92.5 fL (ref 79.5–101.0)
MONO%: 5.5 % (ref 0.0–14.0)
RBC: 3.62 10*6/uL — ABNORMAL LOW (ref 3.70–5.45)
RDW: 13.5 % (ref 11.2–14.5)

## 2012-07-24 NOTE — Patient Instructions (Addendum)
Doing well continue the arimidex daily   We will see you back in 6 months

## 2012-07-24 NOTE — Telephone Encounter (Signed)
gv pt appt schedule for July and sent pt back to lab. Per pt Arimidex does not show up on her med list print out. Message given to nurse.

## 2012-07-24 NOTE — Progress Notes (Signed)
OFFICE PROGRESS NOTE  CC  [name], [name] Needs, FNP [address] [place] @ [place] [address] Dr. Lonie [address] Dr. 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  #2 patient has now completed radiation therapy on 05/02/2012. Overall she's tolerated it well.  #3 she will now begin adjuvant antiestrogen therapy with Arimidex 1 mg daily starting 05/12/2012. Total of 5 years of therapy is planned.  CURRENT THERAPY: Arimidex 1 mg daily  INTERVAL HISTORY: Dorothy Li 67 y.o. female returns for Followup visit today.She does have some fatigue with some pain in the right breast. But she denies any nausea vomiting fevers chills night sweats headaches no shortness of breath no chest pains no palpitations she has not noticed any masses in her breasts. Left sided defibrillator present. Remainder of the 10 point review of systems is negative.   MEDICAL HISTORY: Past Medical History  Diagnosis Date  . ETOH abuse      Elevated AST, mildly elevated INR (? component of cirrhosis). Has now quit ETOH.   [name] Hyperlipemia   . Cardiomyopathy     Echo (4/11) with EF 25%, diffuse hypokinesis, moderate LV dilation, decreased RV  systolic function but normal RV size, PA systolic pressure 35 mmHg. Lexiscan myoview (4/11): EF 29%,  diffuse hypokinesis, basal anteroseptal and apical scar, no ischemia.  The cardiomyopathy is likely due to a combination of CAD (LM stenosis) and ETOH abuse.   [name] CAD (coronary artery disease)     LHC (4/11) showed 75% ostial LM stenosis with significant damping upon catheter engagement,  60-70% mLAD, 60% ostial D1, EF 30%.  Patient had CABG with LIMA-LAD and SVG-OM2.   . Syncope     none since ICD placement  . Arthritis   . Stress fracture of right foot 01/17/12    Dr. Loralee Pacas  . Rash     from tape   . VT (ventricular tachycardia)     Dual chamber Medtronic ICD placed 7/11  . ICD (implantable cardiac defibrillator) in place Medtronic    Interrogated on 11/18/11  . Carotid stenosis     carotid dopplers (4/11) with 60-79% LICA stenosis.  Carotid dopplers (10/11): 60-79%  LICA stenosis.  . Nephrolithiasis   . ARF (acute renal failure)     in setting dehydration with ACE I use   . 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 HTN (hypertension)   . S/P radiation therapy 03/22/12 - 05/02/12    Right Breast: 50 gray/25Fractions with Boost of 10gray/5 Fractions  . Use of anastrozole (Arimidex) 05/12/12    ALLERGIES:  is allergic to penicillins and tape.  MEDICATIONS:  Current Outpatient Prescriptions  Medication Sig Dispense Refill  . aspirin EC 81 MG tablet Take 1 tablet (81 mg total) by mouth daily.      . Calcium Carbonate-Vitamin D (CALCIUM 600-D) 600-400 MG-UNIT per tablet Take 1 tablet by mouth 2 (two) times daily.       . carvedilol (COREG) 12.5 MG tablet 1 and 1/2 tablets (total 18.75mg ) two times a day  90 tablet  6  . Cholecalciferol (VITAMIN D3) 3000 UNITS TABS  Take 2,000 Units by mouth. Taking Vitamin D3 2000 units once daily      . CRESTOR 40 MG tablet TAKE ONE TABLET BY MOUTH EVERY DAY  90 tablet  3  . enalapril (VASOTEC) 2.5 MG tablet Take 2.5 mg by mouth 2 (two) times daily.       . hydrALAZINE (APRESOLINE) 50 MG tablet Take 1 tablet (50 mg total) by mouth 3 (three) times daily.  270 tablet  3  . isosorbide mononitrate (IMDUR) 60 MG 24 hr tablet Take 1 tablet (60 mg total) by mouth daily.  30 tablet  6  . spironolactone (ALDACTONE) 25 MG tablet Take 1 tablet (25 mg total) by mouth daily before breakfast.  30 tablet  6     SURGICAL HISTORY:  Past Surgical History  Procedure Date  . Icd implantation 2011    ICD-Medtronic    Remote - No /  Hx of VT, LU chest  . Breast biopsy 12/08/11    Right Breast, Upper Outer Quadrant: Invasive Ductal:DCIS  . Coronary artery bypass graft 4/11     x 2  . Cardiac catheterization   . Breast lumpectomy 01-19-12    right lumpectomy/  . Abdominal hysterectomy     TAH-BSO, fibroids, age 78  . Colonoscopy 04/11/2012    Procedure: COLONOSCOPY;  Surgeon: Beverley Fiedler, MD;  Location: [facility];  Service: Gastroenterology;  Laterality: N/A;    REVIEW OF SYSTEMS:  Pertinent items are noted in HPI.   PHYSICAL EXAMINATION: General appearance: alert, cooperative and appears stated age Resp: clear to auscultation bilaterally and normal percussion bilaterally Cardio: regular rate and rhythm, S1, S2 normal, no murmur, click, rub or gallop GI: soft, non-tender; bowel sounds normal; no masses,  no organomegaly Extremities: extremities normal, atraumatic, no cyanosis or edema Neurologic: Grossly normal Right breast: Incisional scar appears healed adequately presently there is no discharge minimally tender she is noted to have darkening of the skinthe right breast is slightly more swollen than the left breast..  ECOG PERFORMANCE STATUS: 1 - Symptomatic but completely ambulatory  Blood pressure 91/62, pulse 115, temperature 98.4 F (36.9 C), resp. rate 20, weight 171 lb 12.8 oz (77.928 kg).  LABORATORY DATA: Lab Results  Component Value Date   WBC 5.7 05/12/2012   HGB 10.9* 05/12/2012   HCT 33.2* 05/12/2012   MCV 95.1 05/12/2012   PLT 196 05/12/2012      Chemistry      Component Value Date/Time   NA 135 05/29/2012 0849   NA 138 05/12/2012 1259   K 4.6 05/29/2012 0849   K 4.2 05/12/2012 1259   CL 107 05/29/2012 0849   CL 110* 05/12/2012 1259   CO2 20 05/29/2012 0849   CO2 22 05/12/2012 1259   BUN 27* 05/29/2012 0849   BUN 22.0 05/12/2012 1259   CREATININE 1.0 05/29/2012 0849    CREATININE 1.0 05/12/2012 1259      Component Value Date/Time   CALCIUM 9.4 05/29/2012 0849   CALCIUM 9.3 05/12/2012 1259   ALKPHOS 72 05/12/2012 1259   ALKPHOS 76 01/12/2012 0842   AST 14 05/12/2012 1259   AST 14 01/12/2012 0842   ALT 12 05/12/2012 1259   ALT 11 01/12/2012 0842   BILITOT 0.26 05/12/2012 1259   BILITOT 0.2* 01/12/2012 0842     FINAL DIAGNOSIS Diagnosis 1. Breast, lumpectomy, Right - WELL DIFFERENTIATED INVASIVE DUCTAL CARCINOMA, GRADE I (1.0 CM). - INVASIVE TUMOR IS FOCALLY PRESENT AT ANTERIOR MARGIN. - NO LYMPHOVASCULAR INVASION IDENTIFIED. -  DUCTAL CARCINOMA IN SITU, GRADE I-II WITH COMEDO NECROSIS. - SEE TUMOR SYNOPTIC TEMPLATE BELOW. 2. Breast, excision, additional superior margin, right - BENIGN BREAST PARENCHYMA SEE COMMENT. - NO ATYPIA OR MALIGNANCY PRESENT. 3. Breast, excision, additional inferior margin, right - BENIGN BREAST WITH FIBROCYSTIC CHANGE AND USUAL DUCTAL HYPERPLASIA SEE COMMENT. - MICROCALCIFICATIONS PRESENT IN BENIGN DUCTS AND LOBULES. - NO ATYPIA OR MALIGNANCY PRESENT. 4. Breast, excision, additional medial margin, right - BENIGN BREAST PARENCHYMA WITH FIBROCYSTIC CHANGE AND USUAL DUCT HYPERPLASIA. SEE COMMENT. - NO ATYPIA OR MALIGNANCY PRESENT. 5. Lymph node, sentinel, biopsy, Right axillary#1 - ONE LYMPH NODE, NEGATIVE FOR TUMOR (0/1). 6. Lymph node, sentinel, biopsy, Right axillary #2 - ONE LYMPH NODE, NEGATIVE FOR TUMOR (0/1). Microscopic Comment 1. BREAST, INVASIVE TUMOR, WITH LYMPH NODE SAMPLING Specimen, including laterality: Right breast. Procedure: Lumpectomy. Grade: I of III Tubule formation: 1 Nuclear pleomorphism: 1 Mitotic:1 1 of 4 FINAL for NIJAH, TEJERA [phone]) Microscopic Comment(continued) Tumor size (gross measurement): 1.0 cm Margins: Invasive, distance to closest margin: anterior margin. In-situ, distance to closest margin: less than 0.1 mm (anterior) If margin positive, focally or broadly:  Focally Lymphovascular invasion: Absent. Ductal carcinoma in situ: Present. Grade: I-II of III Extensive intraductal component: Absent. Lobular neoplasia: Absent. Tumor focality: Unifocal Treatment effect: None If present, treatment effect in breast tissue, lymph nodes or both: N/A Extent of tumor: Skin: N/A Nipple: N/A Skeletal muscle: N/A Lymph nodes: # examined: 2 Lymph nodes with metastasis: 0 Breast prognostic profile: Estrogen receptor: Not repeated, previous study demonstrated 100% positivity ([id number]) Progesterone receptor: Not repeated, previous study demonstrated 98% positivity ([id number]) Her 2 neu: Repeated, previous study demonstrated no amplification ([id number]) Ki-67: Not repeated, previous study demonstrated 11% proliferation rate ([id number]) Non-neoplastic breast: Intraductal papilloma, sclerosing adenosis with calcifications, and previous biopsy site. TNM: pT1b, pN0, pMX (CRR:gt, 01/21/12) 2. The surgical resection margin(s) of the specimen were inked and microscopically evaluated. 3. The surgical resection margin(s) of the specimen were inked and microscopically evaluated. 4. The surgical resection margin(s) of the specimen were inked and microscopically evaluated.  RADIOGRAPHIC STUDIES:   ASSESSMENT: 68 year old female with:  #1 new diagnosis of stage I well-differentiated invasive ductal carcinoma of the right breast status post right lumpectomy with septal node biopsy. The final pathology revealed a 1.0 cm invasive ductal carcinoma ER +100% PR +98% HER-2/neu negative Ki-67 11% 2 sentinel nodes were negative for metastatic disease.   #2 patient is s/p radiation therapy at this time. She  completed radiation on 04/03/2012.  #3 she will begin Arimidex 1 mg daily. Total of 5 years of therapy is planned.   PLAN:  #1 Continue arimidex daily   #2 I will plan on seeing her back in 6 months time or sooner if need arises.  All questions were answered.  The patient knows to call the clinic with any problems, questions or concerns. We can certainly see the patient much sooner if necessary.  I spent 25 minutes counseling the patient face to face. The total time spent in the appointment was 30 minutes.    Drue Second, MD Medical/Oncology [facility] [phone] (beeper) [phone] (Office)  07/24/2012, 9:10 AM

## 2012-08-25 ENCOUNTER — Encounter: Payer: Self-pay | Admitting: Internal Medicine

## 2012-08-25 ENCOUNTER — Encounter: Payer: Self-pay | Admitting: Cardiology

## 2012-08-25 ENCOUNTER — Ambulatory Visit (INDEPENDENT_AMBULATORY_CARE_PROVIDER_SITE_OTHER): Payer: Medicare Other | Admitting: Cardiology

## 2012-08-25 DIAGNOSIS — I5022 Chronic systolic (congestive) heart failure: Secondary | ICD-10-CM

## 2012-08-25 DIAGNOSIS — I472 Ventricular tachycardia, unspecified: Secondary | ICD-10-CM

## 2012-08-25 DIAGNOSIS — I428 Other cardiomyopathies: Secondary | ICD-10-CM

## 2012-08-25 DIAGNOSIS — Z95810 Presence of automatic (implantable) cardiac defibrillator: Secondary | ICD-10-CM

## 2012-08-25 LAB — ICD DEVICE OBSERVATION
AL AMPLITUDE: 3.1 mv
AL IMPEDENCE ICD: 494 Ohm
AL THRESHOLD: 0.5 V
PACEART VT: 0
RV LEAD AMPLITUDE: 4 mv
TZAT-0001ATACH: 3
TZAT-0001FASTVT: 1
TZAT-0001SLOWVT: 1
TZAT-0012ATACH: 150 ms
TZAT-0012ATACH: 150 ms
TZAT-0012FASTVT: 200 ms
TZAT-0012SLOWVT: 200 ms
TZAT-0012SLOWVT: 200 ms
TZAT-0018ATACH: NEGATIVE
TZAT-0019ATACH: 6 V
TZAT-0019SLOWVT: 8 V
TZAT-0019SLOWVT: 8 V
TZAT-0020ATACH: 1.5 ms
TZAT-0020ATACH: 1.5 ms
TZAT-0020SLOWVT: 1.5 ms
TZAT-0020SLOWVT: 1.5 ms
TZON-0003VSLOWVT: 450 ms
TZST-0001ATACH: 5
TZST-0001ATACH: 6
TZST-0001FASTVT: 4
TZST-0001FASTVT: 5
TZST-0001SLOWVT: 5
TZST-0002ATACH: NEGATIVE
TZST-0002FASTVT: NEGATIVE
TZST-0002FASTVT: NEGATIVE
TZST-0002FASTVT: NEGATIVE
TZST-0003SLOWVT: 25 J
TZST-0003SLOWVT: 35 J
TZST-0003SLOWVT: 35 J
VENTRICULAR PACING ICD: 0 pct

## 2012-08-25 MED ORDER — HYDRALAZINE HCL 50 MG PO TABS: 50.0000 mg | ORAL_TABLET | Freq: Three times a day (TID) | ORAL | Status: DC

## 2012-08-25 MED ORDER — ROSUVASTATIN CALCIUM 40 MG PO TABS: 40.0000 mg | ORAL_TABLET | Freq: Every day | ORAL | Status: DC

## 2012-08-25 MED ORDER — ENALAPRIL MALEATE 2.5 MG PO TABS: 2.5000 mg | ORAL_TABLET | Freq: Two times a day (BID) | ORAL | Status: DC

## 2012-08-25 MED ORDER — ISOSORBIDE MONONITRATE ER 60 MG PO TB24: 60.0000 mg | ORAL_TABLET | Freq: Every day | ORAL | Status: DC

## 2012-08-25 MED ORDER — CARVEDILOL 12.5 MG PO TABS: ORAL_TABLET | ORAL | Status: DC

## 2012-08-25 MED ORDER — SPIRONOLACTONE 25 MG PO TABS: 25.0000 mg | ORAL_TABLET | Freq: Every day | ORAL | Status: DC

## 2012-08-25 NOTE — Progress Notes (Signed)
ICD check/device clinic only. See PaceArt report. Annual device follow-up scheduled with Dr. Graciela Husbands on 11/17/2012.

## 2012-09-04 ENCOUNTER — Other Ambulatory Visit: Payer: Medicare Other

## 2012-09-05 ENCOUNTER — Ambulatory Visit: Payer: Self-pay | Admitting: Cardiology

## 2012-09-05 ENCOUNTER — Other Ambulatory Visit (INDEPENDENT_AMBULATORY_CARE_PROVIDER_SITE_OTHER): Payer: Medicare Other

## 2012-09-05 ENCOUNTER — Ambulatory Visit: Payer: Medicare Other | Admitting: Cardiology

## 2012-09-05 DIAGNOSIS — I5022 Chronic systolic (congestive) heart failure: Secondary | ICD-10-CM

## 2012-09-05 LAB — BASIC METABOLIC PANEL
BUN: 28 mg/dL — ABNORMAL HIGH (ref 6–23)
Calcium: 9.9 mg/dL (ref 8.4–10.5)
GFR: 62.53 mL/min (ref >60.00)
Potassium: 4.7 mEq/L (ref 3.5–5.1)
Sodium: 139 mEq/L (ref 135–145)

## 2012-09-25 ENCOUNTER — Encounter: Payer: Self-pay | Admitting: Cardiology

## 2012-09-25 ENCOUNTER — Ambulatory Visit (INDEPENDENT_AMBULATORY_CARE_PROVIDER_SITE_OTHER): Payer: Medicare Other | Admitting: Cardiology

## 2012-09-25 ENCOUNTER — Telehealth: Payer: Self-pay | Admitting: Cardiology

## 2012-09-25 ENCOUNTER — Other Ambulatory Visit: Payer: Self-pay | Admitting: *Deleted

## 2012-09-25 VITALS — BP 118/58 | HR 93 | Ht 68.0 in | Wt 173.0 lb

## 2012-09-25 DIAGNOSIS — I5023 Acute on chronic systolic (congestive) heart failure: Secondary | ICD-10-CM

## 2012-09-25 DIAGNOSIS — R55 Syncope and collapse: Secondary | ICD-10-CM

## 2012-09-25 DIAGNOSIS — I25810 Atherosclerosis of coronary artery bypass graft(s) without angina pectoris: Secondary | ICD-10-CM

## 2012-09-25 DIAGNOSIS — I6529 Occlusion and stenosis of unspecified carotid artery: Secondary | ICD-10-CM

## 2012-09-25 DIAGNOSIS — I6522 Occlusion and stenosis of left carotid artery: Secondary | ICD-10-CM

## 2012-09-25 DIAGNOSIS — I5022 Chronic systolic (congestive) heart failure: Secondary | ICD-10-CM

## 2012-09-25 MED ORDER — CARVEDILOL 25 MG PO TABS: 25.0000 mg | ORAL_TABLET | Freq: Two times a day (BID) | ORAL | Status: DC

## 2012-09-25 NOTE — Telephone Encounter (Signed)
New Prob    Pt returning phone call. Would like to speak to nurse.

## 2012-09-25 NOTE — Progress Notes (Signed)
Patient ID: Dorothy Li, female   DOB: 10/25/1945, 67 y.o.   MRN: [id number] PCP: Adline Mango  67 yo with history of cardiomyopathy s/p ICD, and CAD s/p CABG presents for followup.   Patient had a left heart cath in 4/11 which showed a 75% ostial left main stenosis. She had CABG x 2.  She has moderate to severe LV systolic dysfunction that I think is a mixed picture, due to both CAD and probably ETOH abuse.  Since her CABG, she has quit smoking and quit drinking ETOH. She had syncope and episodes of VT on monitor, so Medtronic ICD was placed in 7/11.  She was diagnosed with breast cancer last year and had a lumpectomy followed by radiation.   Dorothy Li has been doing well symptomatically since I last saw her.  No exertional dyspnea or chest pain.  Mild lightheadedness if she stands too quickly.  No orthopnea or PND.  Weight is stable.  She has been off Lasix.  She has been going to exercise classes at the [facility].    Labs (8/11): BNP 181 Labs (9/11): K 4.4, creatinine 0.6 Labs (10/11): BNP 95 Labs (11/11): BNP 65, creatinine 0.8, K 4.7, LDL 86, HDL 55 Labs (12/11): K 4.5, creatinine 0.7 Labs (1/12): LDL 69, HDL 41 Labs (6/12): K 4.9, creatinine 0.97, LDL 82, HDL 45 Labs (7/12): creatinine 3.36 => 2.57 Labs (9/12): K 3.7, creatinine 0.9, BNP 65 Labs (10/12): K 4.4, creatinine 0.8 Labs (2/13): K 4.3, creatinine 0.82, LDL 77, HDL 45 Labs (5/13): K 4.1, creatinine 1.0 Labs (8/13): K 4.7, creatinine 0.9, LDL 51, HDL 60 Labs (10/13): LDL 62, HDL 53 Labs (11/13): K 4.6, creatinine 1.0 Labs (3/14): K 4.7, creatinine 1.1   Allergies:  1)  ! Pcn  Past Medical History: 1. HTN 2. ETOH abuse: Elevated AST, mildly elevated INR (? component of cirrhosis). Has now quit ETOH.  3. History of ARF in setting of dehydration/ACEI in the summer of 2011 and again in 2012.  4. Hyperlipidemia 5. Nephrolithiasis 6. Cardiomyopathy: Echo (4/11) with EF 25%, diffuse hypokinesis, moderate LV dilation, decreased  RV systolic function but normal RV size, PA systolic pressure 35 mmHg. Lexiscan myoview (4/11): EF 29%, diffuse hypokinesis, basal anteroseptal and apical scar, no ischemia.  The cardiomyopathy is likely due to a combination of CAD (LM stenosis) and ETOH abuse.  ANA and SPEP were negative.  Echo (6/11): EF 25%, mild diastolic dysfunction, normal RV, no pulmonary HTN 7. VT: Dual chamber Medtronic ICD placed 7/11 8. CAD: LHC (4/11) showed 75% ostial LM stenosis with significant damping upon catheter engagement, 60-70% mLAD, 60% ostial D1, EF 30%.  Patient had CABG with LIMA-LAD and SVG-OM2.  9. carotid stenosis: carotid dopplers (4/11) with 60-79% LICA stenosis.  Carotid dopplers (10/11): 60-79% LICA stenosis.  Carotids (4/12): 60-79% LICA.  Carotids (11/12): 60-79% LICA.   Carotids (5/13): 60-79% LICA.  10.  Syncope 11. Breast cancer: Right lumpectomy in 7/13 followed by radiation.    Family History: Sister with heart surgery, she is not sure why.   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  No drug use and negative urine drug screen in 2/11  Review of Systems        All systems reviewed and negative except as per HPI.   Current Outpatient Prescriptions  Medication Sig Dispense Refill  . anastrozole (ARIMIDEX) 1  MG tablet Take 1 mg by mouth daily.      [name] aspirin EC 81 MG tablet Take 1 tablet (81 mg total) by mouth daily.      . Calcium Carbonate-Vitamin D (CALCIUM 600-D) 600-400 MG-UNIT per tablet Take 1 tablet by mouth 2 (two) times daily.       . Cholecalciferol (VITAMIN D3) 3000 UNITS TABS Take 2,000 Units by mouth. Taking Vitamin D3 2000 units once daily      . enalapril (VASOTEC) 2.5 MG tablet Take 1 tablet (2.5 mg total) by mouth 2 (two) times daily.  60 tablet  11  . hydrALAZINE (APRESOLINE) 50 MG tablet Take 1 tablet (50 mg total) by mouth 3 (three) times  daily.  90 tablet  11  . isosorbide mononitrate (IMDUR) 60 MG 24 hr tablet Take 1 tablet (60 mg total) by mouth daily.  30 tablet  11  . rosuvastatin (CRESTOR) 40 MG tablet Take 1 tablet (40 mg total) by mouth daily.  30 tablet  11  . spironolactone (ALDACTONE) 25 MG tablet Take 1 tablet (25 mg total) by mouth daily before breakfast.  30 tablet  11  . carvedilol (COREG) 25 MG tablet Take 1 tablet (25 mg total) by mouth 2 (two) times daily.  60 tablet  6   No current facility-administered medications for this visit.    BP 118/58  Pulse 93  Ht 5\' 8"  (1.727 m)  Wt 173 lb (78.472 kg)  BMI 26.31 kg/m2  SpO2 98% General:  Well developed, well nourished, in no acute distress. Neck:  Neck supple, no JVD. No masses, thyromegaly or abnormal cervical nodes. Lungs:  Clear bilaterally to auscultation and percussion. Heart:  Non-displaced PMI, chest non-tender; regular rate and rhythm, S1, S2 without murmurs, rubs. +S4. Carotid upstroke normal, no bruit.  Pedals normal pulses. No edema, no varicosities. Abdomen:  Bowel sounds positive; abdomen soft and non-tender without masses, organomegaly, or hernias noted. No hepatosplenomegaly. Extremities:  No clubbing or cyanosis. Neurologic:  Alert and oriented x 3. Psych:  Normal affect.  Assessment/Plan:  CHRONIC SYSTOLIC HEART FAILURE  Doing well symptomatically, NYHA class II. She is not volume overloaded on exam and seems to be doing fine off Lasix.   - Continue current enalapril, hydralazine/Imdur, and spironolactone. I will not increase enalapril further at this time, has had ARF in the past with ACEI increase (in association with dehydration).  - Increase Coreg to  25 mg bid.  - Continue exercising at [facility].  - BMET at followup in 3 months.  CORONARY ATHEROSLERO UNSPEC TYPE BYPASS GRAFT  Stable with no ischemic symptoms. Continue ASA 81, beta blocker, ACEI, statin. CAROTID ARTERY STENOSIS Moderate LICA stenosis, repeat carotids in 5/14. PURE  HYPERCHOLESTEROLEMIA Good LDL on Crestor.   Dorothy Li 09/25/2012 1:38 PM

## 2012-09-25 NOTE — Telephone Encounter (Signed)
Spoke with patient. She is aware she  needs carotid doppler in May 2014.

## 2012-09-25 NOTE — Patient Instructions (Addendum)
Increase coreg(carvedilol) to 25mg  two times a day.  Your physician recommends that you schedule a follow-up appointment in: 3 months with Dr Shirlee Latch.  Your physician recommends that you return for lab work a few days before your appointment with Dr Shirlee Latch in 3 months. BMET.

## 2012-11-10 ENCOUNTER — Other Ambulatory Visit: Payer: Self-pay | Admitting: Family Medicine

## 2012-11-10 ENCOUNTER — Encounter (INDEPENDENT_AMBULATORY_CARE_PROVIDER_SITE_OTHER): Payer: Self-pay | Admitting: Surgery

## 2012-11-10 ENCOUNTER — Ambulatory Visit (INDEPENDENT_AMBULATORY_CARE_PROVIDER_SITE_OTHER): Payer: Medicare Other | Admitting: Surgery

## 2012-11-10 VITALS — BP 118/82 | HR 80 | Resp 14 | Ht 68.5 in | Wt 166.0 lb

## 2012-11-10 DIAGNOSIS — Z853 Personal history of malignant neoplasm of breast: Secondary | ICD-10-CM

## 2012-11-10 NOTE — Patient Instructions (Signed)
See me again in six months. You sshould have a mammogram soon

## 2012-11-10 NOTE — Progress Notes (Signed)
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 She has no problems or concerns on either side. She is now on anti-estrogen PFSH: She has had no significant changes since the last visit here.  ROS: There have been no significant changes since the last visit here  EXAM:  VS: BP 118/82  Pulse 80  Resp 14  Ht 5' 8.5" (1.74 m)  Wt 166 lb (75.297 kg)  BMI 24.87 kg/m2  General: The patient is alert, oriented, generally healthy appearing, NAD. Mood and affect are normal.  Breasts:  Right breast shows minimal radiation change with hyperpigmentation and some edema. The lumpectomy site is slightly firm, otherwise no problems. Left breast is normal  Lymphatics: She has no axillary or supraclavicular adenopathy on either side.  Extremities: Full ROM of the surgical side with no lymphedema noted.  Data Reviewed: Epic notes  Impression: Doing well  Plan: RTC six months

## 2012-11-17 ENCOUNTER — Ambulatory Visit (INDEPENDENT_AMBULATORY_CARE_PROVIDER_SITE_OTHER): Payer: Medicare Other | Admitting: Internal Medicine

## 2012-11-17 ENCOUNTER — Encounter: Payer: Self-pay | Admitting: Internal Medicine

## 2012-11-17 VITALS — BP 128/62 | HR 82 | Ht 68.0 in | Wt 168.0 lb

## 2012-11-17 DIAGNOSIS — I5022 Chronic systolic (congestive) heart failure: Secondary | ICD-10-CM

## 2012-11-17 DIAGNOSIS — I428 Other cardiomyopathies: Secondary | ICD-10-CM

## 2012-11-17 DIAGNOSIS — Z95810 Presence of automatic (implantable) cardiac defibrillator: Secondary | ICD-10-CM

## 2012-11-17 DIAGNOSIS — I472 Ventricular tachycardia: Secondary | ICD-10-CM

## 2012-11-17 LAB — ICD DEVICE OBSERVATION
AL AMPLITUDE: 2.875 mv
AL IMPEDENCE ICD: 437 Ohm
AL THRESHOLD: 0.25 V
BATTERY VOLTAGE: 3.1159 V
DEV-0020ICD: NEGATIVE
FVT: 0
RV LEAD IMPEDENCE ICD: 342 Ohm
RV LEAD THRESHOLD: 1 V
TOT-0006: 20110701000000
TZAT-0001ATACH: 2
TZAT-0001SLOWVT: 1
TZAT-0001SLOWVT: 2
TZAT-0002ATACH: NEGATIVE
TZAT-0002ATACH: NEGATIVE
TZAT-0002FASTVT: NEGATIVE
TZAT-0005SLOWVT: 88 pct
TZAT-0005SLOWVT: 91 pct
TZAT-0012FASTVT: 200 ms
TZAT-0018ATACH: NEGATIVE
TZAT-0018ATACH: NEGATIVE
TZAT-0018FASTVT: NEGATIVE
TZAT-0018SLOWVT: NEGATIVE
TZAT-0018SLOWVT: NEGATIVE
TZAT-0019ATACH: 6 V
TZAT-0019ATACH: 6 V
TZAT-0019FASTVT: 8 V
TZAT-0019SLOWVT: 8 V
TZON-0003VSLOWVT: 450 ms
TZON-0004VSLOWVT: 20
TZON-0005SLOWVT: 12
TZST-0001ATACH: 5
TZST-0001ATACH: 6
TZST-0001FASTVT: 5
TZST-0001FASTVT: 6
TZST-0001SLOWVT: 3
TZST-0002ATACH: NEGATIVE
TZST-0002FASTVT: NEGATIVE
TZST-0002FASTVT: NEGATIVE
TZST-0002FASTVT: NEGATIVE
TZST-0003SLOWVT: 35 J
VF: 0

## 2012-11-17 NOTE — Assessment & Plan Note (Signed)
The patient's device was interrogated.  The information was reviewed. No changes were made in the programming.    

## 2012-11-17 NOTE — Assessment & Plan Note (Signed)
No intercurrent VT 

## 2012-11-17 NOTE — Progress Notes (Signed)
Patient Care Team: Baker Pierini, FNP as PCP - General (Family Medicine) Laurey Morale, MD (Cardiology) Currie Paris, MD (General Surgery)   HPI  Dorothy Li is a 67 y.o. female  seen in followup for an ICD implanted 2011  for ventricular tachycardia with syncope. She has a history of coronary disease with prior bypass surgery also 2011  and left ventricular dysfunction felt to be somewhat out of proportion to her coronary disease and chronic systolic failure The patient denies chest pain, shortness of breath, nocturnal dyspnea, orthopnea or peripheral edema. There have been no palpitations, lightheadedness or syncope.     Past Medical History  Diagnosis Date  . ETOH abuse      Elevated AST, mildly elevated INR (? component of cirrhosis). Has now quit ETOH.   [name] Hyperlipemia   . Cardiomyopathy     Echo (4/11) with EF 25%, diffuse hypokinesis, moderate LV dilation, decreased RV  systolic function but normal RV size, PA systolic pressure 35 mmHg. Lexiscan myoview (4/11): EF 29%,  diffuse hypokinesis, basal anteroseptal and apical scar, no ischemia.  The cardiomyopathy is likely due to a combination of CAD (LM stenosis) and ETOH abuse.   [name] CAD (coronary artery disease)     LHC (4/11) showed 75% ostial LM stenosis with significant damping upon catheter engagement, 60-70% mLAD, 60% ostial D1, EF 30%.  Patient had CABG with LIMA-LAD and SVG-OM2.   . Syncope     none since ICD placement  . Arthritis   . Stress fracture of right foot 01/17/12    Dr. Loralee Pacas  . Rash     from tape   . VT (ventricular tachycardia)     Dual chamber Medtronic ICD placed 7/11  . ICD (implantable cardiac defibrillator) in place Medtronic    Interrogated on 11/18/11  . Carotid stenosis     carotid dopplers (4/11) with 60-79% LICA stenosis.  Carotid dopplers (10/11): 60-79%  LICA stenosis.  . Nephrolithiasis   . ARF (acute renal failure)     in setting dehydration with ACE I use   . Breast cancer  12/08/11    ER/PR +, breast 10 o'clock dx=invasive Ductal Ca,,dcis  . HTN (hypertension)   . S/P radiation therapy 03/22/12 - 05/02/12    Right Breast: 50 gray/25Fractions with Boost of 10gray/5 Fractions  . Use of anastrozole (Arimidex) 05/12/12    Past Surgical History  Procedure Laterality Date  . Icd implantation  2011    ICD-Medtronic    Remote - No /  Hx of VT, LU chest  . Breast biopsy  12/08/11    Right Breast, Upper Outer Quadrant: Invasive Ductal:DCIS  . Coronary artery bypass graft  4/11     x 2  . Cardiac catheterization    . Breast lumpectomy  01-19-12    right lumpectomy/  . Abdominal hysterectomy      TAH-BSO, fibroids, age 27  . Colonoscopy  04/11/2012    Procedure: COLONOSCOPY;  Surgeon: Beverley Fiedler, MD;  Location: [facility];  Service: Gastroenterology;  Laterality: N/A;    Current Outpatient Prescriptions  Medication Sig Dispense Refill  . anastrozole (ARIMIDEX) 1 MG tablet Take 1 mg by mouth daily.      [name] aspirin EC 81 MG tablet Take 1 tablet (81 mg total) by mouth daily.      . Calcium Carbonate-Vitamin D (CALCIUM 600-D) 600-400 MG-UNIT per tablet Take 1 tablet by mouth 2 (two) times daily.       [name]  carvedilol (COREG) 25 MG tablet Take 1 tablet (25 mg total) by mouth 2 (two) times daily.  60 tablet  6  . Cholecalciferol (VITAMIN D3) 3000 UNITS TABS Take 2,000 Units by mouth. Taking Vitamin D3 2000 units once daily      . enalapril (VASOTEC) 2.5 MG tablet Take 1 tablet (2.5 mg total) by mouth 2 (two) times daily.  60 tablet  11  . hydrALAZINE (APRESOLINE) 50 MG tablet Take 1 tablet (50 mg total) by mouth 3 (three) times daily.  90 tablet  11  . isosorbide mononitrate (IMDUR) 60 MG 24 hr tablet Take 1 tablet (60 mg total) by mouth daily.  30 tablet  11  . rosuvastatin (CRESTOR) 40 MG tablet Take 1 tablet (40 mg total) by mouth daily.  30 tablet  11  . spironolactone (ALDACTONE) 25 MG tablet Take 1 tablet (25 mg total) by mouth daily before breakfast.  30 tablet  11    No current facility-administered medications for this visit.    Allergies  Allergen Reactions  . Penicillins Other (See Comments)    "blacks out" per pt   . Tape Itching and Rash    Bandage on breast from bx caused rash    Review of Systems negative except from HPI and PMH  Physical Exam BP 128/62  Pulse 82  Ht 5\' 8"  (1.727 m)  Wt 168 lb (76.204 kg)  BMI 25.55 kg/m2  SpO2 100% Well developed and well nourished in no acute distress HENT normal E scleral and icterus clear Neck Supple JVP flat; carotids brisk and full Clear to ausculation  Regular rate and rhythm, no murmurs gallops or rub Soft with active bowel sounds No clubbing cyanosis none Edema Alert and oriented, grossly normal motor and sensory function Skin Warm and Dry    Assessment and  Plan

## 2012-11-17 NOTE — Patient Instructions (Addendum)
You will need a device check with Baxter Hire or Gunnar Fusi in 3 months Your physician recommends that you schedule a follow-up appointment in: 1year with Dr. Graciela Husbands  Your physician recommends that you continue on your current medications as directed. Please refer to the Current Medication list given to you today.

## 2012-11-17 NOTE — Assessment & Plan Note (Signed)
Stable post pacing 

## 2012-11-24 ENCOUNTER — Ambulatory Visit
Admission: RE | Admit: 2012-11-24 | Discharge: 2012-11-24 | Disposition: A | Discharge status: Home / Self Care | Payer: Medicare Other | Source: Ambulatory Visit | Attending: Family Medicine | Admitting: Family Medicine

## 2012-11-24 DIAGNOSIS — Z853 Personal history of malignant neoplasm of breast: Secondary | ICD-10-CM

## 2012-11-28 ENCOUNTER — Encounter (INDEPENDENT_AMBULATORY_CARE_PROVIDER_SITE_OTHER): Payer: Medicare Other

## 2012-11-28 DIAGNOSIS — I6529 Occlusion and stenosis of unspecified carotid artery: Secondary | ICD-10-CM

## 2012-11-28 DIAGNOSIS — I6522 Occlusion and stenosis of left carotid artery: Secondary | ICD-10-CM

## 2012-12-04 ENCOUNTER — Other Ambulatory Visit (INDEPENDENT_AMBULATORY_CARE_PROVIDER_SITE_OTHER): Payer: Medicare Other

## 2012-12-04 DIAGNOSIS — I5023 Acute on chronic systolic (congestive) heart failure: Secondary | ICD-10-CM

## 2012-12-04 LAB — BASIC METABOLIC PANEL
CO2: 23 mEq/L (ref 19–32)
Calcium: 9.4 mg/dL (ref 8.4–10.5)
Chloride: 107 mEq/L (ref 96–112)
Sodium: 137 mEq/L (ref 135–145)

## 2012-12-04 NOTE — Progress Notes (Signed)
Quick Note:  Patient returned call. Results provided to patient. She has appt this Wednesday with Dr. Shirlee Latch and will discuss results with him at visit, in addition to, scheduling Thyroid US. ______

## 2012-12-06 ENCOUNTER — Ambulatory Visit (INDEPENDENT_AMBULATORY_CARE_PROVIDER_SITE_OTHER): Payer: Medicare Other | Admitting: Cardiology

## 2012-12-06 ENCOUNTER — Encounter: Payer: Self-pay | Admitting: Cardiology

## 2012-12-06 ENCOUNTER — Encounter: Payer: Self-pay | Admitting: *Deleted

## 2012-12-06 VITALS — BP 114/56 | HR 73 | Ht 68.0 in | Wt 165.0 lb

## 2012-12-06 DIAGNOSIS — I25810 Atherosclerosis of coronary artery bypass graft(s) without angina pectoris: Secondary | ICD-10-CM

## 2012-12-06 DIAGNOSIS — I5022 Chronic systolic (congestive) heart failure: Secondary | ICD-10-CM

## 2012-12-06 DIAGNOSIS — E042 Nontoxic multinodular goiter: Secondary | ICD-10-CM

## 2012-12-06 DIAGNOSIS — R0989 Other specified symptoms and signs involving the circulatory and respiratory systems: Secondary | ICD-10-CM

## 2012-12-06 LAB — LIPID PANEL
Cholesterol: 113 mg/dL (ref 0–200)
LDL Cholesterol: 54 mg/dL (ref 0–99)
Triglycerides: 66 mg/dL (ref 0.0–149.0)

## 2012-12-06 LAB — TSH: TSH: 0.36 u[IU]/mL (ref 0.35–5.50)

## 2012-12-06 NOTE — Patient Instructions (Addendum)
Your physician recommends that you have lab work today--Lipid profile/TSH.  Schedule an appointment for an ultrasound of your thyroid.  Your physician recommends that you return for lab work in: 3 months --BMET.    Your physician recommends that you schedule a follow-up appointment in: 4 months with Dr Shirlee Latch.

## 2012-12-07 NOTE — Progress Notes (Signed)
Patient ID: THOMAS RHUDE, female   DOB: 06/28/1946, 67 y.o.   MRN: [id number] PCP: Adline Mango  67 yo with history of cardiomyopathy s/p ICD, and CAD s/p CABG presents for followup.   Patient had a left heart cath in 4/11 which showed a 75% ostial left main stenosis. She had CABG x 2.  She has moderate to severe LV systolic dysfunction that I think is a mixed picture, due to both CAD and probably ETOH abuse.  Since her CABG, she has quit smoking and quit drinking ETOH. She had syncope and episodes of VT on monitor, so Medtronic ICD was placed in 7/11.  She was diagnosed with breast cancer last year and had a lumpectomy followed by radiation.   Mrs Fulop has been doing well symptomatically since I last saw her.  No exertional dyspnea or chest pain.  No orthopnea or PND.  Weight is actually down about 8 lbs.  She has been off Lasix.  She has been going to exercise classes at the [facility] 5 days/week.    Labs (8/11): BNP 181 Labs (9/11): K 4.4, creatinine 0.6 Labs (10/11): BNP 95 Labs (11/11): BNP 65, creatinine 0.8, K 4.7, LDL 86, HDL 55 Labs (12/11): K 4.5, creatinine 0.7 Labs (1/12): LDL 69, HDL 41 Labs (6/12): K 4.9, creatinine 0.97, LDL 82, HDL 45 Labs (7/12): creatinine 3.36 => 2.57 Labs (9/12): K 3.7, creatinine 0.9, BNP 65 Labs (10/12): K 4.4, creatinine 0.8 Labs (2/13): K 4.3, creatinine 0.82, LDL 77, HDL 45 Labs (5/13): K 4.1, creatinine 1.0 Labs (8/13): K 4.7, creatinine 0.9, LDL 51, HDL 60 Labs (10/13): LDL 62, HDL 53 Labs (11/13): K 4.6, creatinine 1.0 Labs (3/14): K 4.7, creatinine 1.1 Labs (6/14): K 4.2, creatinine 0.8  ECG: NSR, anteroseptal T wave inversions  Allergies:  1)  ! Pcn  Past Medical History: 1. HTN 2. ETOH abuse: Elevated AST, mildly elevated INR (? component of cirrhosis). Has now quit ETOH.  3. History of ARF in setting of dehydration/ACEI in the summer of 2011 and again in 2012.  4. Hyperlipidemia 5. Nephrolithiasis 6. Cardiomyopathy: Echo (4/11) with EF  25%, diffuse hypokinesis, moderate LV dilation, decreased RV systolic function but normal RV size, PA systolic pressure 35 mmHg. Lexiscan myoview (4/11): EF 29%, diffuse hypokinesis, basal anteroseptal and apical scar, no ischemia.  The cardiomyopathy is likely due to a combination of CAD (LM stenosis) and ETOH abuse.  ANA and SPEP were negative.  Echo (6/11): EF 25%, mild diastolic dysfunction, normal RV, no pulmonary HTN 7. VT: Dual chamber Medtronic ICD placed 7/11 8. CAD: LHC (4/11) showed 75% ostial LM stenosis with significant damping upon catheter engagement, 60-70% mLAD, 60% ostial D1, EF 30%.  Patient had CABG with LIMA-LAD and SVG-OM2.  9. carotid stenosis: carotid dopplers (4/11) with 60-79% LICA stenosis.  Carotid dopplers (10/11): 60-79% LICA stenosis.  Carotids (4/12): 60-79% LICA.  Carotids (11/12): 60-79% LICA.   Carotids (5/13): 60-79% LICA.  Carotids (5/14): 60-79% LICA, thyroid nodule noted.  10.  Syncope 11. Breast cancer: Right lumpectomy in 7/13 followed by radiation.    Family History: Sister with heart surgery, she is not sure why.   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 Married, lives in [place] Quit smoking 4/11 Prior moderate ETOH use (2 liquor drinks daily) but quit in 3/11.  No drug use and negative urine drug screen in 2/11   Current Outpatient Prescriptions  Medication Sig Dispense Refill  . anastrozole (ARIMIDEX) 1 MG tablet  Take 1 mg by mouth daily.      [name] aspirin EC 81 MG tablet Take 1 tablet (81 mg total) by mouth daily.      . Calcium Carbonate-Vitamin D (CALCIUM 600-D) 600-400 MG-UNIT per tablet Take 1 tablet by mouth 2 (two) times daily.       . carvedilol (COREG) 25 MG tablet Take 1 tablet (25 mg total) by mouth 2 (two) times daily.  60 tablet  6  . Cholecalciferol (VITAMIN D3) 3000 UNITS TABS Take 2,000 Units by mouth. Taking Vitamin D3 2000 units once daily      . enalapril (VASOTEC) 2.5 MG tablet Take 1 tablet (2.5 mg total) by  mouth 2 (two) times daily.  60 tablet  11  . hydrALAZINE (APRESOLINE) 50 MG tablet Take 1 tablet (50 mg total) by mouth 3 (three) times daily.  90 tablet  11  . isosorbide mononitrate (IMDUR) 60 MG 24 hr tablet Take 1 tablet (60 mg total) by mouth daily.  30 tablet  11  . rosuvastatin (CRESTOR) 40 MG tablet Take 1 tablet (40 mg total) by mouth daily.  30 tablet  11  . spironolactone (ALDACTONE) 25 MG tablet Take 1 tablet (25 mg total) by mouth daily before breakfast.  30 tablet  11   No current facility-administered medications for this visit.    BP 114/56  Pulse 73  Ht 5\' 8"  (1.727 m)  Wt 165 lb (74.844 kg)  BMI 25.09 kg/m2 General:  Well developed, well nourished, in no acute distress. Neck:  Neck supple, no JVD. No masses, thyromegaly or abnormal cervical nodes. Lungs:  Clear bilaterally to auscultation and percussion. Heart:  Non-displaced PMI, chest non-tender; regular rate and rhythm, S1, S2 without murmurs, rubs. +S4. Carotid upstroke normal, no bruit.  Pedals normal pulses. No edema, no varicosities. Abdomen:  Bowel sounds positive; abdomen soft and non-tender without masses, organomegaly, or hernias noted. No hepatosplenomegaly. Extremities:  No clubbing or cyanosis. Neurologic:  Alert and oriented x 3. Psych:  Normal affect.  Assessment/Plan:  CHRONIC SYSTOLIC HEART FAILURE  Doing well symptomatically, NYHA class II. She is not volume overloaded on exam and seems to be doing fine off Lasix.   - Continue current Coreg, enalapril, hydralazine/Imdur, and spironolactone. I will not increase enalapril further at this time, has had ARF in the past with ACEI increase (in association with dehydration).  - Continue exercising at [facility].  - BMET in 3 months.  CORONARY ATHEROSLERO UNSPEC TYPE BYPASS GRAFT  Stable with no ischemic symptoms. Continue ASA 81, beta blocker, ACEI, statin. CAROTID ARTERY STENOSIS Moderate LICA stenosis, stable over time, repeat carotids in 1 year.  PURE  HYPERCHOLESTEROLEMIA Good LDL on Crestor.  Thyroid Nodule Noted on carotid dopplers.  Will check TSH and get dedicated thyroid US.   Marca Ancona 12/07/2012 5:44 AM

## 2012-12-13 ENCOUNTER — Ambulatory Visit (HOSPITAL_COMMUNITY)
Admission: RE | Admit: 2012-12-13 | Discharge: 2012-12-13 | Disposition: A | Discharge status: Home / Self Care | Payer: Medicare Other | Source: Ambulatory Visit | Attending: Cardiology | Admitting: Cardiology

## 2012-12-13 DIAGNOSIS — I25810 Atherosclerosis of coronary artery bypass graft(s) without angina pectoris: Secondary | ICD-10-CM

## 2012-12-13 DIAGNOSIS — E042 Nontoxic multinodular goiter: Secondary | ICD-10-CM

## 2012-12-13 DIAGNOSIS — E041 Nontoxic single thyroid nodule: Secondary | ICD-10-CM | POA: Insufficient documentation

## 2012-12-13 DIAGNOSIS — I5022 Chronic systolic (congestive) heart failure: Secondary | ICD-10-CM

## 2013-01-22 ENCOUNTER — Ambulatory Visit (HOSPITAL_BASED_OUTPATIENT_CLINIC_OR_DEPARTMENT_OTHER): Payer: Medicare Other | Admitting: Oncology

## 2013-01-22 ENCOUNTER — Other Ambulatory Visit (HOSPITAL_BASED_OUTPATIENT_CLINIC_OR_DEPARTMENT_OTHER): Payer: Medicare Other | Admitting: Lab

## 2013-01-22 ENCOUNTER — Encounter: Payer: Self-pay | Admitting: Oncology

## 2013-01-22 VITALS — BP 88/46 | HR 101 | Temp 97.5°F | Resp 20 | Ht 68.0 in | Wt 167.6 lb

## 2013-01-22 DIAGNOSIS — Z170 Estrogen receptor positive status [ER+]: Secondary | ICD-10-CM

## 2013-01-22 DIAGNOSIS — C50411 Malignant neoplasm of upper-outer quadrant of right female breast: Secondary | ICD-10-CM

## 2013-01-22 DIAGNOSIS — C50911 Malignant neoplasm of unspecified site of right female breast: Secondary | ICD-10-CM

## 2013-01-22 DIAGNOSIS — C50419 Malignant neoplasm of upper-outer quadrant of unspecified female breast: Secondary | ICD-10-CM

## 2013-01-22 LAB — COMPREHENSIVE METABOLIC PANEL (CC13)
ALT: 14 U/L (ref 0–55)
Alkaline Phosphatase: 66 U/L (ref 40–150)
CO2: 22 mEq/L (ref 22–29)
Creatinine: 0.9 mg/dL (ref 0.6–1.1)
Sodium: 139 mEq/L (ref 136–145)
Total Bilirubin: 0.27 mg/dL (ref 0.20–1.20)
Total Protein: 7.8 g/dL (ref 6.4–8.3)

## 2013-01-22 LAB — CBC WITH DIFFERENTIAL/PLATELET
EOS%: 4.8 % (ref 0.0–7.0)
Eosinophils Absolute: 0.3 10*3/uL (ref 0.0–0.5)
LYMPH%: 32.1 % (ref 14.0–49.7)
MCH: 31.4 pg (ref 25.1–34.0)
MCHC: 33.3 g/dL (ref 31.5–36.0)
MCV: 94.5 fL (ref 79.5–101.0)
MONO%: 5.8 % (ref 0.0–14.0)
NEUT#: 3.3 10*3/uL (ref 1.5–6.5)
Platelets: 216 10*3/uL (ref 145–400)
RBC: 3.57 10*6/uL — ABNORMAL LOW (ref 3.70–5.45)
RDW: 13.9 % (ref 11.2–14.5)

## 2013-01-22 LAB — VITAMIN D 25 HYDROXY (VIT D DEFICIENCY, FRACTURES): Vit D, 25-Hydroxy: 68 ng/mL (ref 30–89)

## 2013-01-22 NOTE — Progress Notes (Signed)
OFFICE PROGRESS NOTE  CC  [name], [name] Needs, FNP [address] [place] [address] Dr. Lonie Peak Dr. 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  #2 patient has now completed radiation therapy on 05/02/2012. Overall she's tolerated it well.  #3 she began adjuvant antiestrogen therapy with Arimidex 1 mg daily starting 05/12/2012. Total of 5 years of therapy is planned.  CURRENT THERAPY: Arimidex 1 mg daily  INTERVAL HISTORY: KAITHLYN TEAGLE 67 y.o. female returns for 6 followup. Overall she is doing well. She tells me that she had a mammogram performed which did not reveal any recurrent or new disease. I have reviewed the report of the mammogram with her as well. She is tolerating Arimidex well without any problem she denies any fevers chills night sweats headaches shortness of breath chest pains palpitations no myalgias and arthralgias. She does not have any hot flashes. No peripheral paresthesias no vaginal discharge. Remainder of the 10 point review of systems is negative.  MEDICAL HISTORY: Past Medical History  Diagnosis Date  . ETOH abuse      Elevated AST, mildly elevated INR (? component of cirrhosis). Has now quit ETOH.   [name] Hyperlipemia   . Cardiomyopathy     Echo (4/11) with EF 25%, diffuse hypokinesis, moderate LV dilation, decreased RV  systolic function but normal RV size, PA systolic pressure 35 mmHg. Lexiscan myoview (4/11): EF 29%,  diffuse hypokinesis, basal anteroseptal and apical scar, no ischemia.  The cardiomyopathy is likely due to a combination of CAD (LM stenosis) and ETOH  abuse.   [name] CAD (coronary artery disease)     LHC (4/11) showed 75% ostial LM stenosis with significant damping upon catheter engagement, 60-70% mLAD, 60% ostial D1, EF 30%.  Patient had CABG with LIMA-LAD and SVG-OM2.   . Syncope     none since ICD placement  . Arthritis   . Stress fracture of right foot 01/17/12    Dr. Loralee Pacas  . Rash     from tape   . VT (ventricular tachycardia)     Dual chamber Medtronic ICD placed 7/11  . ICD (implantable cardiac defibrillator) in place Medtronic    Interrogated on 11/18/11  . Carotid stenosis     carotid dopplers (4/11) with 60-79% LICA stenosis.  Carotid dopplers (10/11): 60-79%  LICA stenosis.  . Nephrolithiasis   . ARF (acute renal failure)     in setting dehydration with ACE I use   . 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 HTN (hypertension)   . S/P radiation therapy 03/22/12 - 05/02/12    Right Breast: 50 gray/25Fractions with Boost of 10gray/5 Fractions  . Use of anastrozole (Arimidex) 05/12/12    ALLERGIES:  is allergic to penicillins and tape.  MEDICATIONS:  Current Outpatient Prescriptions  Medication Sig Dispense Refill  . anastrozole (ARIMIDEX) 1 MG tablet Take 1 mg by mouth daily.      [name] aspirin EC 81 MG tablet Take 1 tablet (81 mg total) by mouth daily.      . Calcium Carbonate-Vitamin D (CALCIUM 600-D) 600-400 MG-UNIT per tablet Take 1 tablet by  mouth 2 (two) times daily.       . carvedilol (COREG) 25 MG tablet Take 1 tablet (25 mg total) by mouth 2 (two) times daily.  60 tablet  6  . Cholecalciferol (VITAMIN D3) 3000 UNITS TABS Take 2,000 Units by mouth. Taking Vitamin D3 2000 units once daily      . enalapril (VASOTEC) 2.5 MG tablet Take 1 tablet (2.5 mg total) by mouth 2 (two) times daily.  60 tablet  11  . hydrALAZINE (APRESOLINE) 50 MG tablet Take 1 tablet (50 mg total) by mouth 3 (three) times daily.  90 tablet  11  . isosorbide mononitrate (IMDUR) 60 MG 24 hr tablet Take 1 tablet  (60 mg total) by mouth daily.  30 tablet  11  . rosuvastatin (CRESTOR) 40 MG tablet Take 1 tablet (40 mg total) by mouth daily.  30 tablet  11  . spironolactone (ALDACTONE) 25 MG tablet Take 1 tablet (25 mg total) by mouth daily before breakfast.  30 tablet  11   No current facility-administered medications for this visit.    SURGICAL HISTORY:  Past Surgical History  Procedure Laterality Date  . Icd implantation  2011    ICD-Medtronic    Remote - No /  Hx of VT, LU chest  . Breast biopsy  12/08/11    Right Breast, Upper Outer Quadrant: Invasive Ductal:DCIS  . Coronary artery bypass graft  4/11     x 2  . Cardiac catheterization    . Breast lumpectomy  01-19-12    right lumpectomy/  . Abdominal hysterectomy      TAH-BSO, fibroids, age 68  . Colonoscopy  04/11/2012    Procedure: COLONOSCOPY;  Surgeon: Beverley Fiedler, MD;  Location: [facility];  Service: Gastroenterology;  Laterality: N/A;    REVIEW OF SYSTEMS:  Pertinent items are noted in HPI.   PHYSICAL EXAMINATION: General appearance: alert, cooperative and appears stated age Resp: clear to auscultation bilaterally and normal percussion bilaterally Cardio: regular rate and rhythm, S1, S2 normal, no murmur, click, rub or gallop GI: soft, non-tender; bowel sounds normal; no masses,  no organomegaly Extremities: extremities normal, atraumatic, no cyanosis or edema Neurologic: Grossly normal Right breast: Incisional scar appears healed adequately presently there is no discharge minimally tender she is noted to have darkening of the skinthe right breast is slightly more swollen than the left breast..  ECOG PERFORMANCE STATUS: 1 - Symptomatic but completely ambulatory  Blood pressure 88/46, pulse 101, temperature 97.5 F (36.4 C), temperature source Oral, resp. rate 20, height 5\' 8"  (1.727 m), weight 167 lb 9.6 oz (76.023 kg).  LABORATORY DATA: Lab Results  Component Value Date   WBC 6.0 07/24/2012   HGB 11.0* 07/24/2012   HCT 33.5*  07/24/2012   MCV 92.5 07/24/2012   PLT 262 07/24/2012      Chemistry      Component Value Date/Time   NA 137 12/04/2012 0822   NA 139 07/24/2012 0929   K 4.2 12/04/2012 0822   K 4.3 07/24/2012 0929   CL 107 12/04/2012 0822   CL 109* 07/24/2012 0929   CO2 23 12/04/2012 0822   CO2 19* 07/24/2012 0929   BUN 19 12/04/2012 0822   BUN 19.0 07/24/2012 0929   CREATININE 0.8 12/04/2012 0822   CREATININE 1.0 07/24/2012 0929      Component Value Date/Time   CALCIUM 9.4 12/04/2012 0822   CALCIUM 10.5* 07/24/2012 0929   ALKPHOS 74 07/24/2012 0929   ALKPHOS 76 01/12/2012  0842   AST 13 07/24/2012 0929   AST 14 01/12/2012 0842   ALT 10 07/24/2012 0929   ALT 11 01/12/2012 0842   BILITOT 0.39 07/24/2012 0929   BILITOT 0.2* 01/12/2012 0842     FINAL DIAGNOSIS Diagnosis 1. Breast, lumpectomy, Right - WELL DIFFERENTIATED INVASIVE DUCTAL CARCINOMA, GRADE I (1.0 CM). - INVASIVE TUMOR IS FOCALLY PRESENT AT ANTERIOR MARGIN. - NO LYMPHOVASCULAR INVASION IDENTIFIED. - DUCTAL CARCINOMA IN SITU, GRADE I-II WITH COMEDO NECROSIS. - SEE TUMOR SYNOPTIC TEMPLATE BELOW. 2. Breast, excision, additional superior margin, right - BENIGN BREAST PARENCHYMA SEE COMMENT. - NO ATYPIA OR MALIGNANCY PRESENT. 3. Breast, excision, additional inferior margin, right - BENIGN BREAST WITH FIBROCYSTIC CHANGE AND USUAL DUCTAL HYPERPLASIA SEE COMMENT. - MICROCALCIFICATIONS PRESENT IN BENIGN DUCTS AND LOBULES. - NO ATYPIA OR MALIGNANCY PRESENT. 4. Breast, excision, additional medial margin, right - BENIGN BREAST PARENCHYMA WITH FIBROCYSTIC CHANGE AND USUAL DUCT HYPERPLASIA. SEE COMMENT. - NO ATYPIA OR MALIGNANCY PRESENT. 5. Lymph node, sentinel, biopsy, Right axillary#1 - ONE LYMPH NODE, NEGATIVE FOR TUMOR (0/1). 6. Lymph node, sentinel, biopsy, Right axillary #2 - ONE LYMPH NODE, NEGATIVE FOR TUMOR (0/1). Microscopic Comment 1. BREAST, INVASIVE TUMOR, WITH LYMPH NODE SAMPLING Specimen, including laterality: Right breast. Procedure:  Lumpectomy. Grade: I of III Tubule formation: 1 Nuclear pleomorphism: 1 Mitotic:1 1 of 4 FINAL for XARENI, KELCH [id number]) Microscopic Comment(continued) Tumor size (gross measurement): 1.0 cm Margins: Invasive, distance to closest margin: anterior margin. In-situ, distance to closest margin: less than 0.1 mm (anterior) If margin positive, focally or broadly: Focally Lymphovascular invasion: Absent. Ductal carcinoma in situ: Present. Grade: I-II of III Extensive intraductal component: Absent. Lobular neoplasia: Absent. Tumor focality: Unifocal Treatment effect: None If present, treatment effect in breast tissue, lymph nodes or both: N/A Extent of tumor: Skin: N/A Nipple: N/A Skeletal muscle: N/A Lymph nodes: # examined: 2 Lymph nodes with metastasis: 0 Breast prognostic profile: Estrogen receptor: Not repeated, previous study demonstrated 100% positivity ([id number]) Progesterone receptor: Not repeated, previous study demonstrated 98% positivity ([id number]) Her 2 neu: Repeated, previous study demonstrated no amplification ([id number]) Ki-67: Not repeated, previous study demonstrated 11% proliferation rate ([id number]) Non-neoplastic breast: Intraductal papilloma, sclerosing adenosis with calcifications, and previous biopsy site. TNM: pT1b, pN0, pMX (CRR:gt, 01/21/12) 2. The surgical resection margin(s) of the specimen were inked and microscopically evaluated. 3. The surgical resection margin(s) of the specimen were inked and microscopically evaluated. 4. The surgical resection margin(s) of the specimen were inked and microscopically evaluated.  RADIOGRAPHIC STUDIES:   ASSESSMENT: 67 year old female with:  #1 new diagnosis of stage I well-differentiated invasive ductal carcinoma of the right breast status post right lumpectomy with septal node biopsy. The final pathology revealed a 1.0 cm invasive ductal carcinoma ER +100% PR +98% HER-2/neu negative Ki-67 11% 2  sentinel nodes were negative for metastatic disease.   #2 patient is s/p radiation therapy at this time. She  completed radiation on 04/03/2012.  #3 she is on Arimidex 1 mg daily. Total of 5 years of therapy is planned.   PLAN:  #1Doing well, no recurrence of breast cancer  #2 Your mammogram looked good  #3 We will see you baack in 6 months  #4 Please call with any problems or questions   All questions were answered. The patient knows to call the clinic with any problems, questions or concerns. We can certainly see the patient much sooner if necessary.  I spent 15 minutes counseling the patient face to face. The total time spent in the  appointment was 30 minutes.    Drue Second, MD Medical/Oncology [facility] [phone] (beeper) [phone] (Office)  01/22/2013, 9:01 AM

## 2013-01-24 ENCOUNTER — Telehealth: Payer: Self-pay | Admitting: *Deleted

## 2013-01-24 NOTE — Telephone Encounter (Signed)
sw pt gv appt for 07/27/13 for labs@ 8:45am and ov @ 9:15am. Pt is aware that i will mail her a letter/avs...td

## 2013-02-19 ENCOUNTER — Ambulatory Visit (INDEPENDENT_AMBULATORY_CARE_PROVIDER_SITE_OTHER): Payer: Medicare Other | Admitting: *Deleted

## 2013-02-19 ENCOUNTER — Encounter: Payer: Self-pay | Admitting: Internal Medicine

## 2013-02-19 DIAGNOSIS — I472 Ventricular tachycardia: Secondary | ICD-10-CM

## 2013-02-19 DIAGNOSIS — Z95810 Presence of automatic (implantable) cardiac defibrillator: Secondary | ICD-10-CM

## 2013-02-19 DIAGNOSIS — I428 Other cardiomyopathies: Secondary | ICD-10-CM

## 2013-02-19 DIAGNOSIS — I5022 Chronic systolic (congestive) heart failure: Secondary | ICD-10-CM

## 2013-02-19 LAB — ICD DEVICE OBSERVATION
AL IMPEDENCE ICD: 437 Ohm
ATRIAL PACING ICD: 1.32 pct
BAMS-0001: 170 {beats}/min
DEV-0020ICD: NEGATIVE
FVT: 0
RV LEAD THRESHOLD: 1 V
TOT-0001: 1
TZAT-0001ATACH: 1
TZAT-0002ATACH: NEGATIVE
TZAT-0002ATACH: NEGATIVE
TZAT-0005SLOWVT: 88 pct
TZAT-0005SLOWVT: 91 pct
TZAT-0011SLOWVT: 10 ms
TZAT-0011SLOWVT: 10 ms
TZAT-0012SLOWVT: 200 ms
TZAT-0018ATACH: NEGATIVE
TZAT-0018SLOWVT: NEGATIVE
TZAT-0018SLOWVT: NEGATIVE
TZAT-0019ATACH: 6 V
TZAT-0019ATACH: 6 V
TZAT-0019FASTVT: 8 V
TZAT-0020ATACH: 1.5 ms
TZAT-0020FASTVT: 1.5 ms
TZON-0003ATACH: 350 ms
TZON-0003FASTVT: 240 ms
TZON-0003SLOWVT: 320 ms
TZON-0004SLOWVT: 32
TZON-0005SLOWVT: 12
TZST-0001ATACH: 5
TZST-0001FASTVT: 2
TZST-0001FASTVT: 3
TZST-0001FASTVT: 5
TZST-0001SLOWVT: 4
TZST-0001SLOWVT: 6
TZST-0002ATACH: NEGATIVE
TZST-0002FASTVT: NEGATIVE
TZST-0002FASTVT: NEGATIVE
TZST-0003SLOWVT: 25 J
TZST-0003SLOWVT: 35 J
VENTRICULAR PACING ICD: 0.01 pct
VF: 0

## 2013-02-19 NOTE — Progress Notes (Signed)
Defib check in clinic. Changed VT NID from 24 to 32, no other changes made. All functions normal, full details in PaceArt.  ROV w/ device clinic 05/23/13 @ 9:00am

## 2013-03-06 ENCOUNTER — Encounter: Payer: Self-pay | Admitting: *Deleted

## 2013-03-07 ENCOUNTER — Telehealth: Payer: Self-pay | Admitting: *Deleted

## 2013-03-07 NOTE — Telephone Encounter (Signed)
Dorothy Li requesting home monitoring kit---ordered today from [employer]. She plans to apply for cell adapter.

## 2013-03-08 ENCOUNTER — Other Ambulatory Visit (INDEPENDENT_AMBULATORY_CARE_PROVIDER_SITE_OTHER): Payer: Medicare Other

## 2013-03-08 DIAGNOSIS — E042 Nontoxic multinodular goiter: Secondary | ICD-10-CM

## 2013-03-08 DIAGNOSIS — I25810 Atherosclerosis of coronary artery bypass graft(s) without angina pectoris: Secondary | ICD-10-CM

## 2013-03-08 DIAGNOSIS — I5022 Chronic systolic (congestive) heart failure: Secondary | ICD-10-CM

## 2013-03-08 LAB — BASIC METABOLIC PANEL
Chloride: 107 mEq/L (ref 96–112)
Creatinine, Ser: 1 mg/dL (ref 0.4–1.2)
Sodium: 137 mEq/L (ref 135–145)

## 2013-03-26 ENCOUNTER — Telehealth: Payer: Self-pay | Admitting: Cardiology

## 2013-03-26 NOTE — Telephone Encounter (Signed)
Per [facility], monitor ordered on 03-07-13 and shipped date 03-21-13. Pt aware that transmitter should be there in next week and to call if not received.

## 2013-03-26 NOTE — Telephone Encounter (Signed)
New Problem   Pt is calling wanting to know the status of her at home device monitor. She states she spoke with Leonette Most previously. She would like an update and her device that will enable her to do remote checks

## 2013-03-29 ENCOUNTER — Telehealth: Payer: Self-pay | Admitting: Cardiology

## 2013-03-29 NOTE — Telephone Encounter (Signed)
Patient has received cell adapter for [facility].

## 2013-03-29 NOTE — Telephone Encounter (Signed)
New problem  Pt has received her monitor and received the wire x// already transmitted one signal from defib.[name]

## 2013-05-18 ENCOUNTER — Ambulatory Visit (INDEPENDENT_AMBULATORY_CARE_PROVIDER_SITE_OTHER): Payer: Medicare Other | Admitting: Surgery

## 2013-05-18 ENCOUNTER — Encounter (INDEPENDENT_AMBULATORY_CARE_PROVIDER_SITE_OTHER): Payer: Self-pay | Admitting: Surgery

## 2013-05-18 VITALS — BP 126/74 | HR 70 | Temp 98.0°F | Resp 16 | Ht 68.0 in | Wt 165.6 lb

## 2013-05-18 DIAGNOSIS — Z853 Personal history of malignant neoplasm of breast: Secondary | ICD-10-CM

## 2013-05-18 NOTE — Patient Instructions (Signed)
continue annual mammograms and followups in our office

## 2013-05-18 NOTE — Progress Notes (Signed)
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 She has no problems or concerns on either side. She is now on anti-estrogen PFSH: She has had no significant changes since the last visit here.  ROS: There have been no significant changes since the last visit here  EXAM:  VS: BP 126/74  Pulse 70  Temp(Src) 98 F (36.7 C) (Temporal)  Resp 16  Ht 5\' 8"  (1.727 m)  Wt 165 lb 9.6 oz (75.116 kg)  BMI 25.19 kg/m2  General: The patient is alert, oriented, generally healthy appearing, NAD. Mood and affect are normal.  Breasts:  Right breast shows minimal radiation change.The lumpectomy site is slightly firm, otherwise no problems. Left breast is normal  Lymphatics: She has no axillary or supraclavicular adenopathy on either side.   Extremities: Full ROM of the surgical side with no lymphedema noted.  Data Reviewed: Mammogram done 11/24/2012: Clinical Data: Annual examination. Right breast cancer, diagnosed  in 2013. The patient has undergone lumpectomy and radiation  therapy.  DIGITAL DIAGNOSTIC BILATERAL MAMMOGRAM WITH CAD  Comparison: With priors  Findings:  ACR Breast Density Category 3: The breast tissue is heterogeneously  dense.  There are surgical clips at the lumpectomy site in the upper outer  right breast. Surgical clips are also seen in the right axilla.  Skin thickening of the right breast is consistent with radiation  change.  No mass, nonsurgical distortion, or suspicious microcalcification  is identified in either breast to suggest malignancy.  Mammographic images were processed with CAD.  IMPRESSION:  No evidence of malignancy in either breast. Lumpectomy and  radiation changes of the right breast.  RECOMMENDATION:   Bilateral diagnostic mammogram in 1 year.  I have discussed the findings and recommendations with the patient.  Results were also provided in writing at the conclusion of the  visit. If applicable, a reminder letter will be sent to the  patient regarding her next appointment.  BI-RADS CATEGORY 2: Benign finding(s).  Original Report Authenticated By: Britta Mccreedy, M.D.   Impression: Doing well  Plan: RTC six months

## 2013-05-23 ENCOUNTER — Encounter: Payer: Self-pay | Admitting: Internal Medicine

## 2013-05-23 ENCOUNTER — Ambulatory Visit (INDEPENDENT_AMBULATORY_CARE_PROVIDER_SITE_OTHER): Payer: Medicare Other | Admitting: *Deleted

## 2013-05-23 DIAGNOSIS — I5022 Chronic systolic (congestive) heart failure: Secondary | ICD-10-CM

## 2013-05-23 DIAGNOSIS — I428 Other cardiomyopathies: Secondary | ICD-10-CM

## 2013-05-23 DIAGNOSIS — I472 Ventricular tachycardia: Secondary | ICD-10-CM

## 2013-05-23 LAB — MDC_IDC_ENUM_SESS_TYPE_INCLINIC
Brady Statistic AP VP Percent: 0.1 % — CL
Brady Statistic AS VP Percent: 0.1 % — CL
Brady Statistic AS VS Percent: 98.2 %
Lead Channel Impedance Value: 342 Ohm
Lead Channel Impedance Value: 475 Ohm
Lead Channel Pacing Threshold Pulse Width: 0.4 ms
Lead Channel Sensing Intrinsic Amplitude: 2.9 mV
Lead Channel Setting Sensing Sensitivity: 0.3 mV
Zone Setting Detection Interval: 240 ms
Zone Setting Detection Interval: 270 ms
Zone Setting Detection Interval: 450 ms

## 2013-05-23 NOTE — Progress Notes (Signed)
ICD check in clinic. Normal device function. Thresholds and sensing consistent with previous device measurements. Impedance trends stable over time. 25 NSVT episodes---max dur. 2 sec, all episodes were on 10-14---episodes appear to be T wave oversensing---no true VT noted. No mode switches. Histogram distribution appropriate for patient and level of activity. Thoracic impedance is currently below baseline---pt without SOB/Edema/Bloating. No changes made this session. Device programmed at appropriate safety margins. Device programmed to optimize intrinsic conduction. Batt Voltage 3.10 (RRT=2.63). Pt enrolled in remote follow-up. Plan to remotely on 08-24-2013, and with SK in May 2015.

## 2013-06-08 ENCOUNTER — Encounter: Payer: Self-pay | Admitting: Internal Medicine

## 2013-06-08 ENCOUNTER — Encounter: Payer: Self-pay | Admitting: *Deleted

## 2013-06-08 LAB — MDC_IDC_ENUM_SESS_TYPE_INCLINIC
Brady Statistic AP VP Percent: 0 %
Brady Statistic AP VS Percent: 1.72 %
Brady Statistic AS VP Percent: 0 %
Brady Statistic RA Percent Paced: 1.72 %
HighPow Impedance: 45 Ohm
Lead Channel Impedance Value: 361 Ohm
Lead Channel Impedance Value: 475 Ohm
Lead Channel Pacing Threshold Amplitude: 1 V
Lead Channel Pacing Threshold Pulse Width: 0.4 ms
Lead Channel Sensing Intrinsic Amplitude: 3.25 mV
Lead Channel Setting Pacing Amplitude: 2 V
Lead Channel Setting Pacing Pulse Width: 0.4 ms
Zone Setting Detection Interval: 270 ms

## 2013-06-18 ENCOUNTER — Encounter: Payer: Self-pay | Admitting: Cardiovascular Disease

## 2013-06-18 ENCOUNTER — Ambulatory Visit (HOSPITAL_COMMUNITY): Payer: Medicare Other | Attending: Cardiovascular Disease

## 2013-06-18 DIAGNOSIS — I6529 Occlusion and stenosis of unspecified carotid artery: Secondary | ICD-10-CM

## 2013-06-18 DIAGNOSIS — I10 Essential (primary) hypertension: Secondary | ICD-10-CM | POA: Insufficient documentation

## 2013-06-18 DIAGNOSIS — I658 Occlusion and stenosis of other precerebral arteries: Secondary | ICD-10-CM | POA: Insufficient documentation

## 2013-06-18 DIAGNOSIS — I2510 Atherosclerotic heart disease of native coronary artery without angina pectoris: Secondary | ICD-10-CM | POA: Insufficient documentation

## 2013-06-18 DIAGNOSIS — Z87891 Personal history of nicotine dependence: Secondary | ICD-10-CM | POA: Insufficient documentation

## 2013-06-18 DIAGNOSIS — Z951 Presence of aortocoronary bypass graft: Secondary | ICD-10-CM | POA: Insufficient documentation

## 2013-06-18 DIAGNOSIS — E785 Hyperlipidemia, unspecified: Secondary | ICD-10-CM | POA: Insufficient documentation

## 2013-07-10 ENCOUNTER — Other Ambulatory Visit: Payer: Self-pay | Admitting: Cardiology

## 2013-07-11 ENCOUNTER — Other Ambulatory Visit: Payer: Self-pay

## 2013-07-11 MED ORDER — ROSUVASTATIN CALCIUM 40 MG PO TABS: 40.0000 mg | ORAL_TABLET | Freq: Every day | ORAL | Status: DC

## 2013-07-27 ENCOUNTER — Other Ambulatory Visit: Payer: Self-pay | Admitting: *Deleted

## 2013-07-27 ENCOUNTER — Ambulatory Visit (HOSPITAL_BASED_OUTPATIENT_CLINIC_OR_DEPARTMENT_OTHER): Payer: Medicare HMO | Admitting: Oncology

## 2013-07-27 ENCOUNTER — Other Ambulatory Visit (HOSPITAL_BASED_OUTPATIENT_CLINIC_OR_DEPARTMENT_OTHER): Payer: Medicare HMO

## 2013-07-27 ENCOUNTER — Telehealth: Payer: Self-pay | Admitting: Oncology

## 2013-07-27 VITALS — BP 110/71 | HR 92 | Temp 98.0°F | Resp 20 | Ht 68.0 in | Wt 167.9 lb

## 2013-07-27 DIAGNOSIS — C50419 Malignant neoplasm of upper-outer quadrant of unspecified female breast: Secondary | ICD-10-CM

## 2013-07-27 DIAGNOSIS — C50911 Malignant neoplasm of unspecified site of right female breast: Secondary | ICD-10-CM

## 2013-07-27 DIAGNOSIS — C50919 Malignant neoplasm of unspecified site of unspecified female breast: Secondary | ICD-10-CM

## 2013-07-27 DIAGNOSIS — Z170 Estrogen receptor positive status [ER+]: Secondary | ICD-10-CM

## 2013-07-27 LAB — COMPREHENSIVE METABOLIC PANEL (CC13)
ALBUMIN: 3.9 g/dL (ref 3.5–5.0)
ALT: 17 U/L (ref 0–55)
ANION GAP: 10 meq/L (ref 3–11)
AST: 18 U/L (ref 5–34)
Alkaline Phosphatase: 62 U/L (ref 40–150)
BUN: 20.3 mg/dL (ref 7.0–26.0)
CALCIUM: 9.8 mg/dL (ref 8.4–10.4)
CHLORIDE: 110 meq/L — AB (ref 98–109)
CO2: 23 meq/L (ref 22–29)
Creatinine: 0.9 mg/dL (ref 0.6–1.1)
Glucose: 85 mg/dl (ref 70–140)
Potassium: 4.3 mEq/L (ref 3.5–5.1)
Sodium: 144 mEq/L (ref 136–145)
Total Bilirubin: 0.25 mg/dL (ref 0.20–1.20)
Total Protein: 7.4 g/dL (ref 6.4–8.3)

## 2013-07-27 LAB — CBC WITH DIFFERENTIAL/PLATELET
BASO%: 1.9 % (ref 0.0–2.0)
BASOS ABS: 0.1 10*3/uL (ref 0.0–0.1)
EOS%: 5 % (ref 0.0–7.0)
Eosinophils Absolute: 0.3 10*3/uL (ref 0.0–0.5)
HEMATOCRIT: 32.5 % — AB (ref 34.8–46.6)
HEMOGLOBIN: 10.7 g/dL — AB (ref 11.6–15.9)
LYMPH#: 2 10*3/uL (ref 0.9–3.3)
LYMPH%: 34.9 % (ref 14.0–49.7)
MCH: 31.4 pg (ref 25.1–34.0)
MCHC: 32.9 g/dL (ref 31.5–36.0)
MCV: 95.7 fL (ref 79.5–101.0)
MONO#: 0.4 10*3/uL (ref 0.1–0.9)
MONO%: 7.7 % (ref 0.0–14.0)
NEUT#: 2.9 10*3/uL (ref 1.5–6.5)
NEUT%: 50.5 % (ref 38.4–76.8)
PLATELETS: 237 10*3/uL (ref 145–400)
RBC: 3.4 10*6/uL — ABNORMAL LOW (ref 3.70–5.45)
RDW: 13.4 % (ref 11.2–14.5)
WBC: 5.7 10*3/uL (ref 3.9–10.3)

## 2013-07-27 MED ORDER — ANASTROZOLE 1 MG PO TABS: 1.0000 mg | ORAL_TABLET | Freq: Every day | ORAL | Status: DC

## 2013-07-27 NOTE — Telephone Encounter (Signed)
, °

## 2013-07-27 NOTE — Patient Instructions (Signed)
Breast Cancer Survivor Follow-Up Breast cancer begins when cells in the breast divide too rapidly. The extra cells form a lump (tumor). When the cancer is treated, the goal is to get rid of all cancer cells. However, sometimes a few cells survive. These cancer cells can then grow. They become recurrent cancer. This means the cancer comes back after treatment.  Most cases of recurrent breast cancer develop 3 to 5 years after treatment. However, sometimes it comes back just a few months after treatment. Other times, it does not come back until years later. If the cancer comes back in the same area as the first breast cancer, it is called a local recurrence. If the cancer comes back somewhere else in the body, it is called regional recurrence if the site is fairly near the breast or distant recurrence if it is far from the breast. Your caregiver may also use the term metastasize to indicate a cancer that has gone to another part of your body. Treatment is still possible after either kind of recurrence. The cancer can still be controlled.  CAUSES OF RECURRENT CANCER No one knows exactly why breast cancer starts in the first place. Why the cancer comes back after treatment is also not clear. It is known that certain conditions, called risk factors, can make this more likely. They include:  Developing breast cancer for the first time before age 60.  Having breast cancer that involves the lymph nodes. These are small, round pieces of tissue found all over the body. Their job is to help fight infections.  Having a large tumor. Cancer is more apt to come back if the first tumor was bigger than 2 inches (5 cm).  Having certain types of breast cancer, such as:  Inflammatory breast cancer. This rare type grows rapidly and causes the breast to become red and swollen.  A high-grade tumor. The grade of a tumor indicates how fast it will grow and spread. High-grade tumors grow more quickly than other types.  HER2  cancer. This refers to the tumor's genetic makeup. Tumors that have this type of gene are more likely to come back after treatment.  Having close tumor margins. This refers to the space between the tumor and normal, noncancerous cells. If the space is small, the tumor has a greater chance of coming back.  Having treatment involving a surgery to remove the tumor but not the entire breast (lumpectomy) and no radiation therapy. CARE AFTER BREAST CANCER Home Monitoring Women who have had breast cancer should continue to examine their breasts every month. The goal is to catch the cancer quickly if it comes back. Many women find it helpful to do so on the same day each month and to mark the calendar as a reminder. Let your caregiver know immediately if you have any signs of recurrent breast cancer. Symptoms will vary, depending on where the cancer recurs. The original type of treatment can also make a difference. Symptoms of local recurrence after a lumpectomy or a recurrence in the opposite breast may include:  A new lump or thickening in the breast.  A change in the way the skin looks on the breast (such as a rash, dimpling, or wrinkling).  Redness or swelling of the breast.  Changes in the nipple (such as being red, puckered, swollen, or leaking fluid). Symptoms of a recurrence after a breast removal surgery (mastectomy) may include:  A lump or thickening under the skin.  A thickening around the mastectomy scar. Symptoms   of regional recurrence in the lymph nodes near the breast may include:  A lump under the arm or above the collarbone.  Swelling of the arm.  Pain in the arm, shoulder, or chest.  Numbness in the hand or arm. Symptoms of distant recurrence may include:  A cough that does not go away.  Trouble breathing or shortness of breath.  Pain in the bones or the chest. This is pain that lasts or does not respond to rest and medicine.  Headaches.  Sudden vision  problems.  Dizziness.  Nausea or vomiting.  Losing weight without trying to.  Persistent abdominal pain.  Changes in bowel movements or blood in the stool.  Yellowing of the skin or eyes (jaundice).  Blood in the urine or bloody vaginal discharge. Clinical Monitoring  It is helpful to keep a schedule of appointments for needed tests and exams. This includes physical exams, breast exams, exams of the lymph nodes, and general exams.  For the first 3 years after being treated for breast cancer, see your caregiver every 3 to 6 months.  For years 4 and 5 after breast cancer, see your caregiver every 6 to 12 months.  After 5 years, see your caregiver at least once a year.  Regular breast X-rays (mammograms) should continue even if you had a mastectomy.  A mammogram should be done 1 year after the mammogram that first detected breast cancer.  A mammogram should be done every 6 to 12 months after that. Follow your caregiver's advice.  A pelvic exam done by your caregiver checks whether female organs are the normal size and shape. The exam is usually done every year. Ask your caregiver if that schedule is right for you.  Women taking tamoxifen should report any vaginal bleeding immediately to their caregiver. Tamoxifen is often given to women with a certain type of breast cancer. It has been shown to help prevent recurrence.  You will need to decide who your primary caregiver will be.  Most people continue to see their cancer specialist (oncologist) every 3 to 6 months for the first year after cancer treatment.  At some point, you may want to go back to seeing your family caregiver. You would no longer see your oncologist for regular checkups. Many women do this about 1 year after their first diagnosis of breast cancer.  You will still need to be seen every so often by your oncologist. Ask how often that should be. Coordinate this with your family or primary caregiver.  Think about  having genetic counseling. This would provide information on traits that can be passed or inherited from one generation to the next. In some cases, breast cancer runs in families. Tell your caregiver if you:  Are of Ashkenazi Jewish heritage.  Have any family member who has had ovarian cancer.  Have a mother, sister, or daughter who had breast cancer before age 7.  Have 2 or more close relatives who have had breast cancer. This means a mother, sister, daughter, aunt, or grandmother.  Had breast cancer in both breasts.  Have a female relative who has had breast cancer.  Some tests are not recommended for routine screening. Someone recovering from breast cancer does not need to have these tests if there are no problems. The tests have risks, such as radiation exposure, and can be costly. The risks of these tests are thought to be greater than the benefits:  Blood tests.  Chest X-rays.  Bone scans.  Liver ultrasound.  Computed tomography (CT scan).  Positron emission tomography (PET scan).  Magnetic resonance imaging (MRI scan). DIAGNOSIS OF RECURRENT CANCER Recurrent breast cancer may be suspected for various reasons. A mammogram may not look normal. You might feel a lump or have other symptoms. Your caregiver may find something unusual during an exam. To be sure, your caregiver will probably order some tests. The tests are needed because there are symptoms or hints of a problem. They could include:  Blood tests, including a test to check how well the liver is working. The liver is a common site for a distant cancer recurrence.  Imaging tests that create pictures of the inside of the body. These tests include:  Chest X-rays to show if the cancer has come back in the lungs.  CT scans to create detailed pictures of various areas of the body and help find a distant recurrence.  MRI scans to find anything unusual in the breast, chest, or lymph nodes.  Breast ultrasound tests to  examine the breasts.  Bone scans to create a picture of your whole skeleton and find cancer in bony areas.  PET scans to create an image of the whole body. PET scans can be used together with CT scans to show more detail.  Biopsy. A small sample of tissue is taken and checked under a microscope. If cancer cells are found, they may be tested to see if they contain the HER2 gene or the hormones estrogen and progesterone. This will help your caregiver decide how to treat the recurrent cancer. TREATMENT  How recurrent breast cancer is treated depends on where the new cancer is found. The type of treatment that was used for the first breast cancer makes a difference, too. A combination of treatments may be used. Options include:  Surgery.  If the cancer comes back in the breast that was not treated before, you may need a lumpectomy or mastectomy.  If the cancer comes back in the breast that was treated before, you may need a mastectomy.  The lymph nodes under the arm may need to be removed.  Radiation therapy.  For a local recurrence, radiation may be used if it was not used during the first treatment.  For a distance recurrence, radiation is sometimes used.  Chemotherapy.  This may be used before surgery to treat recurrent breast cancer.  This may be used to treat recurrent cancer that cannot be treated with surgery.  This may be used to treat a distant recurrence.  Hormone therapy.  Women with the HER2 gene may be given hormone therapy to attack this gene. Document Released: 02/17/2011 Document Revised: 09/13/2011 Document Reviewed: 02/17/2011 [employer] Patient Information 2014 [employer].  

## 2013-08-05 ENCOUNTER — Encounter: Payer: Self-pay | Admitting: Oncology

## 2013-08-05 NOTE — Progress Notes (Signed)
OFFICE PROGRESS NOTE  CC  DEAN, ERIC, MD [place] [address] [address] E[address] Dr. 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  #2 patient has now completed radiation therapy on 05/02/2012. Overall she's tolerated it well.  #3 she began adjuvant antiestrogen therapy with Arimidex 1 mg daily starting 05/12/2012. Total of 5 years of therapy is planned.  CURRENT THERAPY: Arimidex 1 mg daily  INTERVAL HISTORY: Dorothy Li 68 y.o. female returns for 6 followup. Overall she is doing well.  She is tolerating Arimidex well without any problem she denies any fevers chills night sweats headaches shortness of breath chest pains palpitations no myalgias and arthralgias. She does not have any hot flashes. No peripheral paresthesias no vaginal discharge. Remainder of the 10 point review of systems is negative.  MEDICAL HISTORY: Past Medical History  Diagnosis Date  . ETOH abuse      Elevated AST, mildly elevated INR (? component of cirrhosis). Has now quit ETOH.   [name] Hyperlipemia   . Cardiomyopathy     Echo (4/11) with EF 25%, diffuse hypokinesis, moderate LV dilation, decreased RV  systolic function but normal RV size, PA systolic pressure 35 mmHg. Lexiscan myoview (4/11): EF 29%,  diffuse hypokinesis, basal anteroseptal and apical scar, no ischemia.  The cardiomyopathy is likely due to a combination of CAD (LM stenosis) and ETOH abuse.   [name] CAD (coronary artery disease)     LHC (4/11) showed 75% ostial LM stenosis with significant damping upon catheter engagement, 60-70% mLAD, 60% ostial D1, EF 30%.   Patient had CABG with LIMA-LAD and SVG-OM2.   . Syncope     none since ICD placement  . Arthritis   . Stress fracture of right foot 01/17/12    Dr. Vangie Bicker  . Rash     from tape   . VT (ventricular tachycardia)     Dual chamber [employer] ICD placed 7/11  . ICD (implantable cardiac defibrillator) in place [employer]    Interrogated on 11/18/11  . Carotid stenosis     carotid dopplers (5/72) with 62-03% LICA stenosis.  Carotid dopplers (55/97): 41-63%  LICA stenosis.  . Nephrolithiasis   . ARF (acute renal failure)     in setting dehydration with ACE I use   . 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 HTN (hypertension)   . S/P radiation therapy 03/22/12 - 05/02/12    Right Breast: 50 gray/25Fractions with Boost of 10gray/5 Fractions  . Use of anastrozole (Arimidex) 05/12/12    ALLERGIES:  is allergic to penicillins and tape.  MEDICATIONS:  Current Outpatient Prescriptions  Medication Sig Dispense Refill  . aspirin EC 81 MG tablet Take 1 tablet (81 mg total) by mouth daily.      . Calcium Carbonate-Vitamin D (CALCIUM 600-D) 600-400 MG-UNIT per tablet Take 1 tablet by mouth 2 (two) times daily.       . carvedilol (COREG) 25 MG tablet Take 1 tablet (25 mg total) by mouth 2 (two) times daily.  60 tablet  6  . Cholecalciferol (VITAMIN D3) 3000 UNITS TABS Take 2,000 Units by mouth.  Taking Vitamin D3 2000 units once daily      . enalapril (VASOTEC) 2.5 MG tablet Take 1 tablet (2.5 mg total) by mouth 2 (two) times daily.  60 tablet  11  . hydrALAZINE (APRESOLINE) 50 MG tablet Take 1 tablet (50 mg total) by mouth 3 (three) times daily.  90 tablet  11  . isosorbide mononitrate (IMDUR) 60 MG 24 hr tablet Take 1 tablet (60 mg total) by mouth daily.  30 tablet  11  . rosuvastatin (CRESTOR) 40 MG tablet Take 1 tablet (40 mg total) by mouth daily.  30 tablet  6  . spironolactone (ALDACTONE) 25 MG tablet Take 1 tablet (25 mg total) by mouth daily before  breakfast.  30 tablet  11  . anastrozole (ARIMIDEX) 1 MG tablet Take 1 tablet (1 mg total) by mouth daily.  90 tablet  8   No current facility-administered medications for this visit.    SURGICAL HISTORY:  Past Surgical History  Procedure Laterality Date  . Icd implantation  2011    ICD-[employer]    Remote - No /  Hx of VT, LU chest  . Breast biopsy  12/08/11    Right Breast, Upper Outer Quadrant: Invasive Ductal:DCIS  . Coronary artery bypass graft  4/11     x 2  . Cardiac catheterization    . Breast lumpectomy  01-19-12    right lumpectomy/  . Abdominal hysterectomy      TAH-BSO, fibroids, age 44  . Colonoscopy  04/11/2012    Procedure: COLONOSCOPY;  Surgeon: Jerene Bears, MD;  Location: [facility];  Service: Gastroenterology;  Laterality: N/A;    REVIEW OF SYSTEMS:  Pertinent items are noted in HPI.   PHYSICAL EXAMINATION: General appearance: alert, cooperative and appears stated age Resp: clear to auscultation bilaterally and normal percussion bilaterally Cardio: regular rate and rhythm, S1, S2 normal, no murmur, click, rub or gallop GI: soft, non-tender; bowel sounds normal; no masses,  no organomegaly Extremities: extremities normal, atraumatic, no cyanosis or edema Neurologic: Grossly normal Right breast: Incisional scar appears healed adequately presently there is no discharge   ECOG PERFORMANCE STATUS: 1 - Symptomatic but completely ambulatory  Blood pressure 110/71, pulse 92, temperature 98 F (36.7 C), temperature source Oral, resp. rate 20, height 5' 8"  (1.727 m), weight 167 lb 14.4 oz (76.159 kg).  LABORATORY DATA: Lab Results  Component Value Date   WBC 5.7 07/27/2013   HGB 10.7* 07/27/2013   HCT 32.5* 07/27/2013   MCV 95.7 07/27/2013   PLT 237 07/27/2013      Chemistry      Component Value Date/Time   NA 144 07/27/2013 0824   NA 137 03/08/2013 0803   K 4.3 07/27/2013 0824   K 4.3 03/08/2013 0803   CL 107 03/08/2013 0803   CL 109* 07/24/2012 0929   CO2 23  07/27/2013 0824   CO2 23 03/08/2013 0803   BUN 20.3 07/27/2013 0824   BUN 31* 03/08/2013 0803   CREATININE 0.9 07/27/2013 0824   CREATININE 1.0 03/08/2013 0803      Component Value Date/Time   CALCIUM 9.8 07/27/2013 0824   CALCIUM 9.4 03/08/2013 0803   ALKPHOS 62 07/27/2013 0824   ALKPHOS 76 01/12/2012 0842   AST 18 07/27/2013 0824   AST 14 01/12/2012 0842   ALT 17 07/27/2013 0824   ALT 11 01/12/2012 0842   BILITOT 0.25 07/27/2013 0824   BILITOT 0.2* 01/12/2012 0842     FINAL DIAGNOSIS Diagnosis 1. Breast,  lumpectomy, Right - WELL DIFFERENTIATED INVASIVE DUCTAL CARCINOMA, GRADE I (1.0 CM). - INVASIVE TUMOR IS FOCALLY PRESENT AT ANTERIOR MARGIN. - NO LYMPHOVASCULAR INVASION IDENTIFIED. - DUCTAL CARCINOMA IN SITU, GRADE I-II WITH COMEDO NECROSIS. - SEE TUMOR SYNOPTIC TEMPLATE BELOW. 2. Breast, excision, additional superior margin, right - BENIGN BREAST PARENCHYMA SEE COMMENT. - NO ATYPIA OR MALIGNANCY PRESENT. 3. Breast, excision, additional inferior margin, right - BENIGN BREAST WITH FIBROCYSTIC CHANGE AND USUAL DUCTAL HYPERPLASIA SEE COMMENT. - MICROCALCIFICATIONS PRESENT IN BENIGN DUCTS AND LOBULES. - NO ATYPIA OR MALIGNANCY PRESENT. 4. Breast, excision, additional medial margin, right - BENIGN BREAST PARENCHYMA WITH FIBROCYSTIC CHANGE AND USUAL DUCT HYPERPLASIA. SEE COMMENT. - NO ATYPIA OR MALIGNANCY PRESENT. 5. Lymph node, sentinel, biopsy, Right axillary#1 - ONE LYMPH NODE, NEGATIVE FOR TUMOR (0/1). 6. Lymph node, sentinel, biopsy, Right axillary #2 - ONE LYMPH NODE, NEGATIVE FOR TUMOR (0/1). Microscopic Comment 1. BREAST, INVASIVE TUMOR, WITH LYMPH NODE SAMPLING Specimen, including laterality: Right breast. Procedure: Lumpectomy. Grade: I of III Tubule formation: 1 Nuclear pleomorphism: 1 Mitotic:1 1 of 4 FINAL for Dorothy, Li [phone]) Microscopic Comment(continued) Tumor size (gross measurement): 1.0 cm Margins: Invasive, distance to closest margin: anterior  margin. In-situ, distance to closest margin: less than 0.1 mm (anterior) If margin positive, focally or broadly: Focally Lymphovascular invasion: Absent. Ductal carcinoma in situ: Present. Grade: I-II of III Extensive intraductal component: Absent. Lobular neoplasia: Absent. Tumor focality: Unifocal Treatment effect: None If present, treatment effect in breast tissue, lymph nodes or both: N/A Extent of tumor: Skin: N/A Nipple: N/A Skeletal muscle: N/A Lymph nodes: # examined: 2 Lymph nodes with metastasis: 0 Breast prognostic profile: Estrogen receptor: Not repeated, previous study demonstrated 100% positivity ([id number]) Progesterone receptor: Not repeated, previous study demonstrated 98% positivity ([id number]) Her 2 neu: Repeated, previous study demonstrated no amplification ([id number]) Ki-67: Not repeated, previous study demonstrated 11% proliferation rate ([id number]) Non-neoplastic breast: Intraductal papilloma, sclerosing adenosis with calcifications, and previous biopsy site. TNM: pT1b, pN0, pMX (CRR:gt, 01/21/12) 2. The surgical resection margin(s) of the specimen were inked and microscopically evaluated. 3. The surgical resection margin(s) of the specimen were inked and microscopically evaluated. 4. The surgical resection margin(s) of the specimen were inked and microscopically evaluated.  RADIOGRAPHIC STUDIES:   ASSESSMENT/PLAN: 68 year old female with:  #1 new diagnosis of stage I well-differentiated invasive ductal carcinoma of the right breast status post right lumpectomy with septal node biopsy. The final pathology revealed a 1.0 cm invasive ductal carcinoma ER +100% PR +98% HER-2/neu negative Ki-67 11% 2 sentinel nodes were negative for metastatic disease.   #2 patient is s/p radiation therapy at this time. She  completed radiation on 04/03/2012.  #3 she is on Arimidex 1 mg daily. Total of 5 years of therapy is planned.She has no evidence of recurrence of  disease.  #4 patient will return in 6 months for follow up   All questions were answered. The patient knows to call the clinic with any problems, questions or concerns. We can certainly see the patient much sooner if necessary.  I spent 20 minutes counseling the patient face to face. The total time spent in the appointment was 30 minutes.    Marcy Panning, MD Medical/Oncology [facility] [phone] (beeper) [phone] (Office)

## 2013-08-10 ENCOUNTER — Other Ambulatory Visit: Payer: Self-pay | Admitting: Cardiology

## 2013-08-23 ENCOUNTER — Other Ambulatory Visit: Payer: Self-pay

## 2013-08-23 DIAGNOSIS — I5022 Chronic systolic (congestive) heart failure: Secondary | ICD-10-CM

## 2013-08-23 MED ORDER — ISOSORBIDE MONONITRATE ER 60 MG PO TB24: 60.0000 mg | ORAL_TABLET | Freq: Every day | ORAL | Status: DC

## 2013-08-24 ENCOUNTER — Encounter: Payer: Medicare Other | Admitting: *Deleted

## 2013-08-27 ENCOUNTER — Encounter: Payer: Self-pay | Admitting: *Deleted

## 2013-08-29 ENCOUNTER — Other Ambulatory Visit: Payer: Self-pay

## 2013-08-29 DIAGNOSIS — I5022 Chronic systolic (congestive) heart failure: Secondary | ICD-10-CM

## 2013-08-29 MED ORDER — HYDRALAZINE HCL 50 MG PO TABS: 50.0000 mg | ORAL_TABLET | Freq: Three times a day (TID) | ORAL | Status: DC

## 2013-08-29 MED ORDER — ISOSORBIDE MONONITRATE ER 60 MG PO TB24: 60.0000 mg | ORAL_TABLET | Freq: Every day | ORAL | Status: DC

## 2013-08-29 MED ORDER — CARVEDILOL 25 MG PO TABS: 25.0000 mg | ORAL_TABLET | Freq: Two times a day (BID) | ORAL | Status: DC

## 2013-08-29 MED ORDER — SPIRONOLACTONE 25 MG PO TABS: 25.0000 mg | ORAL_TABLET | Freq: Every day | ORAL | Status: DC

## 2013-08-29 MED ORDER — ENALAPRIL MALEATE 2.5 MG PO TABS: 2.5000 mg | ORAL_TABLET | Freq: Two times a day (BID) | ORAL | Status: DC

## 2013-09-03 ENCOUNTER — Telehealth: Payer: Self-pay | Admitting: Cardiology

## 2013-09-03 ENCOUNTER — Ambulatory Visit (INDEPENDENT_AMBULATORY_CARE_PROVIDER_SITE_OTHER): Payer: Medicare HMO | Admitting: *Deleted

## 2013-09-03 DIAGNOSIS — I5022 Chronic systolic (congestive) heart failure: Secondary | ICD-10-CM

## 2013-09-03 DIAGNOSIS — I428 Other cardiomyopathies: Secondary | ICD-10-CM

## 2013-09-03 NOTE — Telephone Encounter (Signed)
New message         Pt would like to know how to send a transmission.

## 2013-09-03 NOTE — Telephone Encounter (Signed)
Instructed pt on how to send transmission and transmission successfully went thru/kwm

## 2013-09-08 LAB — MDC_IDC_ENUM_SESS_TYPE_REMOTE
Battery Voltage: 3.08 V
Brady Statistic AP VS Percent: 1.7 %
Brady Statistic AS VP Percent: 0.01 %
Brady Statistic RA Percent Paced: 1.7 %
Brady Statistic RV Percent Paced: 0.01 %
Date Time Interrogation Session: 20150302185438
HIGH POWER IMPEDANCE MEASURED VALUE: 152 Ohm
HIGH POWER IMPEDANCE MEASURED VALUE: 285 Ohm
HIGH POWER IMPEDANCE MEASURED VALUE: 46 Ohm
HIGH POWER IMPEDANCE MEASURED VALUE: 61 Ohm
Lead Channel Impedance Value: 361 Ohm
Lead Channel Pacing Threshold Amplitude: 0.875 V
Lead Channel Pacing Threshold Pulse Width: 0.4 ms
Lead Channel Pacing Threshold Pulse Width: 0.4 ms
Lead Channel Sensing Intrinsic Amplitude: 2.75 mV
Lead Channel Setting Sensing Sensitivity: 0.6 mV
MDC IDC MSMT LEADCHNL RA IMPEDANCE VALUE: 513 Ohm
MDC IDC MSMT LEADCHNL RA PACING THRESHOLD AMPLITUDE: 0.375 V
MDC IDC MSMT LEADCHNL RV SENSING INTR AMPL: 2.875 mV
MDC IDC SET LEADCHNL RA PACING AMPLITUDE: 2 V
MDC IDC SET LEADCHNL RV PACING AMPLITUDE: 2.5 V
MDC IDC SET LEADCHNL RV PACING PULSEWIDTH: 0.4 ms
MDC IDC SET ZONE DETECTION INTERVAL: 350 ms
MDC IDC STAT BRADY AP VP PERCENT: 0 %
MDC IDC STAT BRADY AS VS PERCENT: 98.29 %
Zone Setting Detection Interval: 240 ms
Zone Setting Detection Interval: 270 ms
Zone Setting Detection Interval: 320 ms
Zone Setting Detection Interval: 450 ms

## 2013-10-03 ENCOUNTER — Encounter: Payer: Self-pay | Admitting: *Deleted

## 2013-10-12 ENCOUNTER — Encounter: Payer: Self-pay | Admitting: Internal Medicine

## 2013-10-31 ENCOUNTER — Encounter: Payer: Self-pay | Admitting: Cardiology

## 2013-10-31 ENCOUNTER — Ambulatory Visit (INDEPENDENT_AMBULATORY_CARE_PROVIDER_SITE_OTHER): Payer: Medicare HMO | Admitting: Cardiology

## 2013-10-31 ENCOUNTER — Encounter: Payer: Self-pay | Admitting: *Deleted

## 2013-10-31 VITALS — BP 118/60 | HR 80 | Ht 68.0 in | Wt 171.0 lb

## 2013-10-31 DIAGNOSIS — E7800 Pure hypercholesterolemia, unspecified: Secondary | ICD-10-CM

## 2013-10-31 DIAGNOSIS — I472 Ventricular tachycardia: Secondary | ICD-10-CM

## 2013-10-31 DIAGNOSIS — I4729 Other ventricular tachycardia: Secondary | ICD-10-CM

## 2013-10-31 DIAGNOSIS — I25810 Atherosclerosis of coronary artery bypass graft(s) without angina pectoris: Secondary | ICD-10-CM

## 2013-10-31 DIAGNOSIS — I5022 Chronic systolic (congestive) heart failure: Secondary | ICD-10-CM

## 2013-10-31 LAB — LIPID PANEL
CHOLESTEROL: 146 mg/dL (ref 0–200)
HDL: 54.7 mg/dL (ref >39.00)
LDL Cholesterol: 78 mg/dL (ref 0–99)
Total CHOL/HDL Ratio: 3
Triglycerides: 69 mg/dL (ref 0.0–149.0)
VLDL: 13.8 mg/dL (ref 0.0–40.0)

## 2013-10-31 LAB — BASIC METABOLIC PANEL
BUN: 33 mg/dL — AB (ref 6–23)
CALCIUM: 9.6 mg/dL (ref 8.4–10.5)
CO2: 22 meq/L (ref 19–32)
Chloride: 111 mEq/L (ref 96–112)
Creatinine, Ser: 1 mg/dL (ref 0.4–1.2)
GFR: 71.84 mL/min (ref >60.00)
GLUCOSE: 87 mg/dL (ref 70–99)
Potassium: 5 mEq/L (ref 3.5–5.1)
SODIUM: 138 meq/L (ref 135–145)

## 2013-10-31 NOTE — Patient Instructions (Addendum)
Your physician recommends that you have lab work today--Lipid profile/BMET.  Your physician has requested that you have an echocardiogram. Echocardiography is a painless test that uses sound waves to create images of your heart. It provides your doctor with information about the size and shape of your heart and how well your heart's chambers and valves are working. This procedure takes approximately one hour. There are no restrictions for this procedure.  Your physician recommends that you schedule a follow-up appointment with Dr Aundra Dubin in 4 months in the [facility] at [facility].

## 2013-10-31 NOTE — Progress Notes (Signed)
Patient ID: Dorothy Li, female   DOB: 05/06/1946, 68 y.o.   MRN: [id number] PCP: Dr. Marlou Sa  68 yo with history of cardiomyopathy s/p ICD, and CAD s/p CABG presents for followup.   Patient had a left heart cath in 4/11 which showed a 75% ostial left main stenosis. She had CABG x 2.  She has moderate to severe LV systolic dysfunction that I think is a mixed picture, due to both CAD and probably ETOH abuse.  Since her CABG, she has quit smoking and quit drinking ETOH. She had syncope and episodes of VT on monitor, so Medtronic ICD was placed in 7/11.  She was diagnosed with breast cancer and had a lumpectomy followed by radiation.   Dorothy Li has been doing well symptomatically since I last saw her.  No exertional dyspnea or chest pain.  No orthopnea or PND.  Weight is actually down about 8 lbs.  She has not been taking Lasix.  She has been going to exercise classes at the [facility] 5 days/week.  She did have an episode back in 3/15 where she got lightheaded and passed out after standing.  She has had no further episodes of lightheadedness.  ICD was interrogated, show no significant arrhythmias.    ECG: NSR normal  Labs (8/11): BNP 181 Labs (9/11): K 4.4, creatinine 0.6 Labs (10/11): BNP 95 Labs (11/11): BNP 65, creatinine 0.8, K 4.7, LDL 86, HDL 55 Labs (12/11): K 4.5, creatinine 0.7 Labs (1/12): LDL 69, HDL 41 Labs (6/12): K 4.9, creatinine 0.97, LDL 82, HDL 45 Labs (7/12): creatinine 3.36 => 2.57 Labs (9/12): K 3.7, creatinine 0.9, BNP 65 Labs (10/12): K 4.4, creatinine 0.8 Labs (2/13): K 4.3, creatinine 0.82, LDL 77, HDL 45 Labs (5/13): K 4.1, creatinine 1.0 Labs (8/13): K 4.7, creatinine 0.9, LDL 51, HDL 60 Labs (10/13): LDL 62, HDL 53 Labs (11/13): K 4.6, creatinine 1.0 Labs (3/14): K 4.7, creatinine 1.1 Labs (6/14): K 4.2, creatinine 0.8 Labs (1/15): K 4.3, creatinine 0.9, HCT 32.5  Allergies:  1)  ! Pcn  Past Medical History: 1. HTN 2. ETOH abuse: Elevated AST, mildly elevated INR (?  component of cirrhosis). Has now quit ETOH.  3. History of ARF in setting of dehydration/ACEI in the summer of 2011 and again in 2012.  4. Hyperlipidemia 5. Nephrolithiasis 6. Cardiomyopathy: Echo (4/11) with EF 25%, diffuse hypokinesis, moderate LV dilation, decreased RV systolic function but normal RV size, PA systolic pressure 35 mmHg. Lexiscan myoview (4/11): EF 29%, diffuse hypokinesis, basal anteroseptal and apical scar, no ischemia.  The cardiomyopathy is likely due to a combination of CAD (LM stenosis) and ETOH abuse.  ANA and SPEP were negative.  Echo (6/11): EF 02%, mild diastolic dysfunction, normal RV, no pulmonary HTN 7. VT: Dual chamber Medtronic ICD placed 7/11 8. CAD: LHC (4/11) showed 75% ostial LM stenosis with significant damping upon catheter engagement, 60-70% mLAD, 60% ostial D1, EF 30%.  Patient had CABG with LIMA-LAD and SVG-OM2.  9. carotid stenosis: carotid dopplers (4/09) with 73-53% LICA stenosis.  Carotid dopplers (29/92): 42-68% LICA stenosis.  Carotids (3/41): 96-22% LICA.  Carotids (29/79): 89-21% LICA.   Carotids (1/94): 17-40% LICA.  Carotids (8/14): 48-18% LICA, thyroid nodule noted.  Carotid dopplers (56/31) with 49-70% LICA stenosis.  10.  Syncope 11. Breast cancer: Right lumpectomy in 7/13 followed by radiation.    Family History: Sister with heart surgery, she is not sure why.   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  Married, lives in [place] smoking 4/11 Prior moderate ETOH use (2 liquor drinks daily) but quit in 3/11.  No drug use and negative urine drug screen in 2/11  ROS: All systems reviewed and negative except as per HPI.    Current Outpatient Prescriptions  Medication Sig Dispense Refill  . anastrozole (ARIMIDEX) 1 MG tablet Take 1 tablet (1 mg total) by mouth daily.  90 tablet  8  . aspirin EC 81 MG tablet Take 1 tablet (81 mg total) by mouth daily.      . Calcium Carbonate-Vitamin D (CALCIUM 600-D) 600-400 MG-UNIT  per tablet Take 1 tablet by mouth 2 (two) times daily.       . carvedilol (COREG) 25 MG tablet Take 1 tablet (25 mg total) by mouth 2 (two) times daily.  60 tablet  2  . Cholecalciferol (VITAMIN D3) 3000 UNITS TABS Take 2,000 Units by mouth. Taking Vitamin D3 2000 units once daily      . enalapril (VASOTEC) 2.5 MG tablet Take 1 tablet (2.5 mg total) by mouth 2 (two) times daily.  60 tablet  2  . hydrALAZINE (APRESOLINE) 50 MG tablet Take 1 tablet (50 mg total) by mouth 3 (three) times daily.  90 tablet  2  . isosorbide mononitrate (IMDUR) 60 MG 24 hr tablet Take 1 tablet (60 mg total) by mouth daily.  30 tablet  2  . rosuvastatin (CRESTOR) 40 MG tablet Take 1 tablet (40 mg total) by mouth daily.  30 tablet  6  . spironolactone (ALDACTONE) 25 MG tablet Take 1 tablet (25 mg total) by mouth daily before breakfast.  30 tablet  2   No current facility-administered medications for this visit.    BP 118/60  Pulse 80  Ht 5\' 8"  (1.727 m)  Wt 77.565 kg (171 lb)  BMI 26.01 kg/m2 General:  Well developed, well nourished, in no acute distress. Neck:  Neck supple, no JVD. No masses, thyromegaly or abnormal cervical nodes. Lungs:  Clear bilaterally to auscultation and percussion. Heart:  Non-displaced PMI, chest non-tender; regular rate and rhythm, S1, S2 without murmurs, rubs. +S4. Carotid upstroke normal, no bruit.  Pedals normal pulses. No edema, no varicosities. Abdomen:  Bowel sounds positive; abdomen soft and non-tender without masses, organomegaly, or hernias noted. No hepatosplenomegaly. Extremities:  No clubbing or cyanosis. Neurologic:  Alert and oriented x 3. Psych:  Normal affect.  Assessment/Plan:  CHRONIC SYSTOLIC HEART FAILURE  Doing well symptomatically, NYHA class II. She is not volume overloaded on exam and seems to be doing fine off Lasix.  Mixed ischemic and nonischemic (ETOH) cardiomyopathy, EF 25% on last echo. - Continue current Coreg, enalapril, hydralazine/Imdur, and  spironolactone. I will not increase enalapril further at this time, has had ARF in the past with ACEI increase (in association with dehydration).  - Continue exercising at [facility].  - BMET today.  - I am going to get an echo to see if she has had any improvement in LV function.   CAD Stable s/p CABG with no ischemic symptoms. Continue ASA 81, beta blocker, ACEI, statin. CAROTID ARTERY STENOSIS Moderate LICA stenosis, stable over time, repeat carotids in 1 year in 12/15.  HYPERCHOLESTEROLEMIA Check lipids today.   Followup 4 months in CHF clinic.   Larey Dresser 10/31/2013

## 2013-11-20 ENCOUNTER — Other Ambulatory Visit: Payer: Self-pay | Admitting: Adult Health

## 2013-11-20 DIAGNOSIS — Z853 Personal history of malignant neoplasm of breast: Secondary | ICD-10-CM

## 2013-11-27 ENCOUNTER — Other Ambulatory Visit: Payer: Self-pay

## 2013-11-27 ENCOUNTER — Other Ambulatory Visit: Payer: Self-pay | Admitting: Adult Health

## 2013-11-27 DIAGNOSIS — Z853 Personal history of malignant neoplasm of breast: Secondary | ICD-10-CM

## 2013-12-03 ENCOUNTER — Ambulatory Visit
Admission: RE | Admit: 2013-12-03 | Discharge: 2013-12-03 | Disposition: A | Discharge status: Home / Self Care | Payer: Medicare HMO | Source: Ambulatory Visit | Attending: Adult Health | Admitting: Adult Health

## 2013-12-03 ENCOUNTER — Encounter (INDEPENDENT_AMBULATORY_CARE_PROVIDER_SITE_OTHER): Payer: Self-pay

## 2013-12-03 ENCOUNTER — Other Ambulatory Visit: Payer: Self-pay | Admitting: Cardiology

## 2013-12-03 DIAGNOSIS — Z853 Personal history of malignant neoplasm of breast: Secondary | ICD-10-CM

## 2013-12-11 ENCOUNTER — Other Ambulatory Visit: Payer: Self-pay

## 2013-12-11 DIAGNOSIS — I5022 Chronic systolic (congestive) heart failure: Secondary | ICD-10-CM

## 2013-12-11 MED ORDER — ISOSORBIDE MONONITRATE ER 60 MG PO TB24: 60.0000 mg | ORAL_TABLET | Freq: Every day | ORAL | Status: AC

## 2013-12-13 ENCOUNTER — Ambulatory Visit (INDEPENDENT_AMBULATORY_CARE_PROVIDER_SITE_OTHER): Payer: Medicare HMO | Admitting: Internal Medicine

## 2013-12-13 ENCOUNTER — Encounter: Payer: Self-pay | Admitting: Internal Medicine

## 2013-12-13 ENCOUNTER — Ambulatory Visit (HOSPITAL_COMMUNITY): Payer: Medicare HMO | Attending: Internal Medicine | Admitting: Radiology

## 2013-12-13 ENCOUNTER — Other Ambulatory Visit: Payer: Self-pay | Admitting: Cardiology

## 2013-12-13 VITALS — BP 81/52 | HR 82 | Ht 68.0 in | Wt 172.0 lb

## 2013-12-13 DIAGNOSIS — I5022 Chronic systolic (congestive) heart failure: Secondary | ICD-10-CM

## 2013-12-13 DIAGNOSIS — I472 Ventricular tachycardia: Secondary | ICD-10-CM

## 2013-12-13 DIAGNOSIS — Z95810 Presence of automatic (implantable) cardiac defibrillator: Secondary | ICD-10-CM

## 2013-12-13 DIAGNOSIS — I4729 Other ventricular tachycardia: Secondary | ICD-10-CM

## 2013-12-13 DIAGNOSIS — I428 Other cardiomyopathies: Secondary | ICD-10-CM

## 2013-12-13 DIAGNOSIS — I25810 Atherosclerosis of coronary artery bypass graft(s) without angina pectoris: Secondary | ICD-10-CM

## 2013-12-13 DIAGNOSIS — I509 Heart failure, unspecified: Secondary | ICD-10-CM | POA: Insufficient documentation

## 2013-12-13 LAB — MDC_IDC_ENUM_SESS_TYPE_INCLINIC
Battery Voltage: 3.08 V
Brady Statistic AP VP Percent: 0 %
Brady Statistic AS VP Percent: 0.01 %
Brady Statistic AS VS Percent: 98.92 %
Brady Statistic RA Percent Paced: 1.06 %
Brady Statistic RV Percent Paced: 0.01 %
HIGH POWER IMPEDANCE MEASURED VALUE: 190 Ohm
HIGH POWER IMPEDANCE MEASURED VALUE: 44 Ohm
HIGH POWER IMPEDANCE MEASURED VALUE: 59 Ohm
HighPow Impedance: 266 Ohm
Lead Channel Impedance Value: 342 Ohm
Lead Channel Pacing Threshold Amplitude: 0.5 V
Lead Channel Sensing Intrinsic Amplitude: 2.625 mV
Lead Channel Sensing Intrinsic Amplitude: 3.25 mV
Lead Channel Sensing Intrinsic Amplitude: 3.625 mV
Lead Channel Setting Pacing Amplitude: 2.5 V
Lead Channel Setting Pacing Pulse Width: 0.4 ms
Lead Channel Setting Sensing Sensitivity: 0.6 mV
MDC IDC MSMT LEADCHNL RA IMPEDANCE VALUE: 513 Ohm
MDC IDC MSMT LEADCHNL RA PACING THRESHOLD PULSEWIDTH: 0.4 ms
MDC IDC MSMT LEADCHNL RA SENSING INTR AMPL: 2.75 mV
MDC IDC MSMT LEADCHNL RV PACING THRESHOLD AMPLITUDE: 0.875 V
MDC IDC MSMT LEADCHNL RV PACING THRESHOLD PULSEWIDTH: 0.4 ms
MDC IDC SESS DTM: 20150611125912
MDC IDC SET LEADCHNL RA PACING AMPLITUDE: 2 V
MDC IDC SET ZONE DETECTION INTERVAL: 270 ms
MDC IDC STAT BRADY AP VS PERCENT: 1.06 %
Zone Setting Detection Interval: 240 ms
Zone Setting Detection Interval: 320 ms
Zone Setting Detection Interval: 350 ms
Zone Setting Detection Interval: 450 ms

## 2013-12-13 LAB — BASIC METABOLIC PANEL
BUN: 23 mg/dL (ref 6–23)
CALCIUM: 9.8 mg/dL (ref 8.4–10.5)
CO2: 23 mEq/L (ref 19–32)
Chloride: 110 mEq/L (ref 96–112)
Creatinine, Ser: 1.1 mg/dL (ref 0.4–1.2)
GFR: 63.6 mL/min (ref >60.00)
Glucose, Bld: 91 mg/dL (ref 70–99)
POTASSIUM: 4.5 meq/L (ref 3.5–5.1)
SODIUM: 141 meq/L (ref 135–145)

## 2013-12-13 NOTE — Progress Notes (Signed)
Patient Care Team: Rogers Blocker, MD as PCP - General (Internal Medicine) Larey Dresser, MD (Cardiology) Haywood Lasso, MD (General Surgery) Deboraha Sprang, MD as Consulting Physician (Cardiology)   HPI  Dorothy Li is a 68 y.o. female  seen in followup for an ICD implanted 2011  for ventricular tachycardia with syncope. She has a history of coronary disease with prior bypass surgery also 2011  and left ventricular dysfunction felt to be somewhat out of proportion to her coronary disease and chronic systolic failure The patient denies chest pain, shortness of breath, nocturnal dyspnea, orthopnea or peripheral edema. There have been no palpitations, lightheadedness or syncope  Potassium level checked 4/15 with a borderline elevated at 5.0  She has chronic low blood pressure but has no symptoms with a somewhat surprisingly..     Past Medical History  Diagnosis Date  . ETOH abuse      Elevated AST, mildly elevated INR (? component of cirrhosis). Has now quit ETOH.   [name] Hyperlipemia   . Cardiomyopathy     Echo (4/11) with EF 25%, diffuse hypokinesis, moderate LV dilation, decreased RV  systolic function but normal RV size, PA systolic pressure 35 mmHg. Lexiscan myoview (4/11): EF 29%,  diffuse hypokinesis, basal anteroseptal and apical scar, no ischemia.  The cardiomyopathy is likely due to a combination of CAD (LM stenosis) and ETOH abuse.   [name] CAD (coronary artery disease)     LHC (4/11) showed 75% ostial LM stenosis with significant damping upon catheter engagement, 60-70% mLAD, 60% ostial D1, EF 30%.  Patient had CABG with LIMA-LAD and SVG-OM2.   . Syncope     none since ICD placement  . Arthritis   . Stress fracture of right foot 01/17/12    Dr. Vangie Bicker  . Rash     from tape   . VT (ventricular tachycardia)     Dual chamber Medtronic ICD placed 7/11  . ICD (implantable cardiac defibrillator) in place Medtronic    Interrogated on 11/18/11  . Carotid stenosis     carotid  dopplers (8/11) with 91-47% LICA stenosis.  Carotid dopplers (82/95): 62-13%  LICA stenosis.  . Nephrolithiasis   . ARF (acute renal failure)     in setting dehydration with ACE I use   . 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 HTN (hypertension)   . S/P radiation therapy 03/22/12 - 05/02/12    Right Breast: 50 gray/25Fractions with Boost of 10gray/5 Fractions  . Use of anastrozole (Arimidex) 05/12/12    Past Surgical History  Procedure Laterality Date  . Icd implantation  2011    ICD-Medtronic    Remote - No /  Hx of VT, LU chest  . Breast biopsy  12/08/11    Right Breast, Upper Outer Quadrant: Invasive Ductal:DCIS  . Coronary artery bypass graft  4/11     x 2  . Cardiac catheterization    . Breast lumpectomy  01-19-12    right lumpectomy/  . Abdominal hysterectomy      TAH-BSO, fibroids, age 16  . Colonoscopy  04/11/2012    Procedure: COLONOSCOPY;  Surgeon: Jerene Bears, MD;  Location: [facility];  Service: Gastroenterology;  Laterality: N/A;    Current Outpatient Prescriptions  Medication Sig Dispense Refill  . anastrozole (ARIMIDEX) 1 MG tablet Take 1 tablet (1 mg total) by mouth daily.  90 tablet  8  . aspirin EC 81 MG tablet Take  1 tablet (81 mg total) by mouth daily.      . Calcium Carbonate-Vitamin D (CALCIUM 600-D) 600-400 MG-UNIT per tablet Take 1 tablet by mouth 2 (two) times daily.       . carvedilol (COREG) 25 MG tablet Take 1 tablet (25 mg total) by mouth 2 (two) times daily.  60 tablet  2  . Cholecalciferol (VITAMIN D3) 3000 UNITS TABS Take 2,000 Units by mouth. Taking Vitamin D3 2000 units once daily      . enalapril (VASOTEC) 2.5 MG tablet Take 1 tablet (2.5 mg total) by mouth 2 (two) times daily.  60 tablet  2  . hydrALAZINE (APRESOLINE) 50 MG tablet TAKE 1 TABLET BY MOUTH 3 (THREE) TIMES DAILY.  90 tablet  4  . isosorbide mononitrate (IMDUR) 60 MG 24 hr tablet Take 1 tablet (60 mg total) by mouth daily.  30 tablet  2  .  rosuvastatin (CRESTOR) 40 MG tablet Take 1 tablet (40 mg total) by mouth daily.  30 tablet  6  . spironolactone (ALDACTONE) 25 MG tablet Take 1 tablet (25 mg total) by mouth daily before breakfast.  30 tablet  2   No current facility-administered medications for this visit.    Allergies  Allergen Reactions  . Penicillins Other (See Comments)    "blacks out" per pt   . Tape Itching and Rash    Bandage on breast from bx caused rash    Review of Systems negative except from HPI and PMH  Physical Exam BP 81/52  Pulse 82  Ht 5\' 8"  (1.727 m)  Wt 172 lb (78.019 kg)  BMI 26.16 kg/m2 Well developed and well nourished in no acute distress HENT normal E scleral and icterus clear Neck Supple JVP flat; carotids brisk and full Clear to ausculation Device pocket well healed; without hematoma or erythema.  There is no tethering   Regular rate and rhythm, no murmurs gallops or rub Soft with active bowel sounds No clubbing cyanosis no  Edema Alert and oriented, grossly normal motor and sensory function Skin Warm and Dry    Assessment and  Plan  Ischemic/nonischemic cardiomyopathy  Implantable defibrillator-Medtronic The patient's device was interrogated.  The information was reviewed. No changes were made in the programming.    Hypotension-asymptomatic  Hyperkalemia-borderline  Patient is euvolemic. She is surprisingly asymptomatic her blood pressures of 80. I've asked her to followup with Dr. DM in the event that she has symptoms of some of her 5 medications could be down titrated.  Coronary disease is stable without chest pain exercise intolerance  Her potassium level was borderline elevated at her last sampling. Low potassium diet recommended. We'll recheck it

## 2013-12-13 NOTE — Patient Instructions (Addendum)

## 2013-12-13 NOTE — Progress Notes (Signed)
Echocardiogram performed.  

## 2014-01-10 ENCOUNTER — Telehealth: Payer: Self-pay | Admitting: Hematology and Oncology

## 2014-01-10 NOTE — Telephone Encounter (Signed)
, °

## 2014-01-25 ENCOUNTER — Other Ambulatory Visit: Payer: Medicare HMO

## 2014-01-25 ENCOUNTER — Ambulatory Visit: Payer: Medicare HMO | Admitting: Oncology

## 2014-03-12 ENCOUNTER — Telehealth: Payer: Self-pay | Admitting: Hematology and Oncology

## 2014-03-12 NOTE — Telephone Encounter (Signed)
, °

## 2014-03-18 ENCOUNTER — Ambulatory Visit (INDEPENDENT_AMBULATORY_CARE_PROVIDER_SITE_OTHER): Payer: Medicare HMO | Admitting: *Deleted

## 2014-03-18 DIAGNOSIS — I428 Other cardiomyopathies: Secondary | ICD-10-CM

## 2014-03-18 DIAGNOSIS — I5022 Chronic systolic (congestive) heart failure: Secondary | ICD-10-CM

## 2014-03-18 LAB — MDC_IDC_ENUM_SESS_TYPE_REMOTE
Battery Voltage: 3.06 V
Brady Statistic AP VP Percent: 0 %
Brady Statistic AS VP Percent: 0.02 %
Brady Statistic RA Percent Paced: 1.06 %
Brady Statistic RV Percent Paced: 0.02 %
HIGH POWER IMPEDANCE MEASURED VALUE: 152 Ohm
HIGH POWER IMPEDANCE MEASURED VALUE: 285 Ohm
HIGH POWER IMPEDANCE MEASURED VALUE: 47 Ohm
HIGH POWER IMPEDANCE MEASURED VALUE: 64 Ohm
Lead Channel Impedance Value: 361 Ohm
Lead Channel Pacing Threshold Amplitude: 0.5 V
Lead Channel Pacing Threshold Amplitude: 1.125 V
Lead Channel Pacing Threshold Pulse Width: 0.4 ms
Lead Channel Sensing Intrinsic Amplitude: 3.875 mV
Lead Channel Setting Pacing Amplitude: 2 V
Lead Channel Setting Sensing Sensitivity: 0.6 mV
MDC IDC MSMT LEADCHNL RA IMPEDANCE VALUE: 551 Ohm
MDC IDC MSMT LEADCHNL RA SENSING INTR AMPL: 3.75 mV
MDC IDC MSMT LEADCHNL RA SENSING INTR AMPL: 3.75 mV
MDC IDC MSMT LEADCHNL RV PACING THRESHOLD PULSEWIDTH: 0.4 ms
MDC IDC MSMT LEADCHNL RV SENSING INTR AMPL: 3.875 mV
MDC IDC SESS DTM: 20150914135151
MDC IDC SET LEADCHNL RV PACING AMPLITUDE: 2.5 V
MDC IDC SET LEADCHNL RV PACING PULSEWIDTH: 0.4 ms
MDC IDC SET ZONE DETECTION INTERVAL: 270 ms
MDC IDC SET ZONE DETECTION INTERVAL: 350 ms
MDC IDC STAT BRADY AP VS PERCENT: 1.06 %
MDC IDC STAT BRADY AS VS PERCENT: 98.92 %
Zone Setting Detection Interval: 240 ms
Zone Setting Detection Interval: 320 ms
Zone Setting Detection Interval: 450 ms

## 2014-03-18 NOTE — Progress Notes (Signed)
Remote ICD transmission.   

## 2014-03-26 ENCOUNTER — Other Ambulatory Visit: Payer: Self-pay

## 2014-03-26 MED ORDER — ROSUVASTATIN CALCIUM 40 MG PO TABS: 40.0000 mg | ORAL_TABLET | Freq: Every day | ORAL | Status: DC

## 2014-03-29 ENCOUNTER — Ambulatory Visit: Payer: Medicare HMO | Admitting: Cardiology

## 2014-04-08 ENCOUNTER — Encounter: Payer: Self-pay | Admitting: Cardiology

## 2014-04-18 ENCOUNTER — Encounter: Payer: Self-pay | Admitting: Internal Medicine

## 2014-04-26 ENCOUNTER — Ambulatory Visit: Payer: Medicare HMO | Admitting: Hematology and Oncology

## 2014-04-26 ENCOUNTER — Other Ambulatory Visit: Payer: Medicare HMO

## 2014-05-06 ENCOUNTER — Encounter: Payer: Self-pay | Admitting: Internal Medicine

## 2014-06-06 ENCOUNTER — Other Ambulatory Visit (HOSPITAL_COMMUNITY): Payer: Self-pay | Admitting: *Deleted

## 2014-06-06 DIAGNOSIS — I6523 Occlusion and stenosis of bilateral carotid arteries: Secondary | ICD-10-CM

## 2014-06-18 ENCOUNTER — Encounter: Payer: Self-pay | Admitting: Internal Medicine

## 2014-06-18 ENCOUNTER — Ambulatory Visit (INDEPENDENT_AMBULATORY_CARE_PROVIDER_SITE_OTHER): Payer: Medicare HMO | Admitting: *Deleted

## 2014-06-18 DIAGNOSIS — I5022 Chronic systolic (congestive) heart failure: Secondary | ICD-10-CM

## 2014-06-18 DIAGNOSIS — I429 Cardiomyopathy, unspecified: Secondary | ICD-10-CM

## 2014-06-18 DIAGNOSIS — I428 Other cardiomyopathies: Secondary | ICD-10-CM

## 2014-06-18 LAB — MDC_IDC_ENUM_SESS_TYPE_REMOTE
Battery Voltage: 3.04 V
Brady Statistic AP VP Percent: 0 %
Brady Statistic AS VS Percent: 99.07 %
Brady Statistic RA Percent Paced: 0.93 %
Brady Statistic RV Percent Paced: 0 %
HIGH POWER IMPEDANCE MEASURED VALUE: 285 Ohm
HighPow Impedance: 46 Ohm
HighPow Impedance: 62 Ohm
Lead Channel Impedance Value: 361 Ohm
Lead Channel Impedance Value: 494 Ohm
Lead Channel Pacing Threshold Amplitude: 0.375 V
Lead Channel Pacing Threshold Amplitude: 0.375 V
Lead Channel Pacing Threshold Pulse Width: 0.4 ms
Lead Channel Pacing Threshold Pulse Width: 0.4 ms
Lead Channel Sensing Intrinsic Amplitude: 2.625 mV
Lead Channel Sensing Intrinsic Amplitude: 2.625 mV
Lead Channel Sensing Intrinsic Amplitude: 3 mV
Lead Channel Setting Pacing Amplitude: 2 V
Lead Channel Setting Pacing Pulse Width: 0.4 ms
MDC IDC MSMT LEADCHNL RV SENSING INTR AMPL: 3 mV
MDC IDC SESS DTM: 20151215154043
MDC IDC SET LEADCHNL RV PACING AMPLITUDE: 2.5 V
MDC IDC SET LEADCHNL RV SENSING SENSITIVITY: 0.6 mV
MDC IDC SET ZONE DETECTION INTERVAL: 240 ms
MDC IDC SET ZONE DETECTION INTERVAL: 270 ms
MDC IDC SET ZONE DETECTION INTERVAL: 350 ms
MDC IDC SET ZONE DETECTION INTERVAL: 450 ms
MDC IDC STAT BRADY AP VS PERCENT: 0.93 %
MDC IDC STAT BRADY AS VP PERCENT: 0 %
Zone Setting Detection Interval: 320 ms

## 2014-06-18 NOTE — Progress Notes (Signed)
Remote ICD transmission.   

## 2014-06-19 ENCOUNTER — Ambulatory Visit (HOSPITAL_COMMUNITY): Payer: Medicare HMO | Attending: Cardiology | Admitting: *Deleted

## 2014-06-19 DIAGNOSIS — I6523 Occlusion and stenosis of bilateral carotid arteries: Secondary | ICD-10-CM | POA: Insufficient documentation

## 2014-06-19 DIAGNOSIS — E785 Hyperlipidemia, unspecified: Secondary | ICD-10-CM | POA: Insufficient documentation

## 2014-06-19 DIAGNOSIS — Z87891 Personal history of nicotine dependence: Secondary | ICD-10-CM | POA: Insufficient documentation

## 2014-06-19 DIAGNOSIS — Z951 Presence of aortocoronary bypass graft: Secondary | ICD-10-CM | POA: Diagnosis not present

## 2014-06-19 DIAGNOSIS — I10 Essential (primary) hypertension: Secondary | ICD-10-CM | POA: Insufficient documentation

## 2014-06-19 DIAGNOSIS — I2510 Atherosclerotic heart disease of native coronary artery without angina pectoris: Secondary | ICD-10-CM | POA: Insufficient documentation

## 2014-06-19 NOTE — Progress Notes (Signed)
Carotid duplex complete 

## 2014-07-11 ENCOUNTER — Encounter: Payer: Self-pay | Admitting: Cardiology

## 2014-09-09 ENCOUNTER — Telehealth: Payer: Self-pay | Admitting: Internal Medicine

## 2014-09-09 NOTE — Telephone Encounter (Signed)
Noted in paceart / all appts canceled.

## 2014-09-09 NOTE — Telephone Encounter (Signed)
New message      Pt is now seeing a doctor at the [facility] for her transmissions and appointments.  She will not be transmitting on the 17th or coming in for appts.  [facility]  Please update your records.

## 2014-09-19 ENCOUNTER — Ambulatory Visit (INDEPENDENT_AMBULATORY_CARE_PROVIDER_SITE_OTHER): Payer: Medicare HMO | Admitting: *Deleted

## 2014-09-19 DIAGNOSIS — I429 Cardiomyopathy, unspecified: Secondary | ICD-10-CM

## 2014-09-19 DIAGNOSIS — I428 Other cardiomyopathies: Secondary | ICD-10-CM

## 2014-09-19 DIAGNOSIS — I5022 Chronic systolic (congestive) heart failure: Secondary | ICD-10-CM

## 2014-09-20 NOTE — Progress Notes (Signed)
Remote ICD transmission.   

## 2014-09-22 LAB — MDC_IDC_ENUM_SESS_TYPE_REMOTE
Brady Statistic AP VP Percent: 0 %
Brady Statistic AS VP Percent: 0 %
Brady Statistic AS VS Percent: 99.36 %
Brady Statistic RA Percent Paced: 0.64 %
Brady Statistic RV Percent Paced: 0 %
HIGH POWER IMPEDANCE MEASURED VALUE: 285 Ohm
HIGH POWER IMPEDANCE MEASURED VALUE: 59 Ohm
HighPow Impedance: 43 Ohm
Lead Channel Impedance Value: 342 Ohm
Lead Channel Sensing Intrinsic Amplitude: 2.5 mV
Lead Channel Sensing Intrinsic Amplitude: 2.5 mV
Lead Channel Sensing Intrinsic Amplitude: 3.125 mV
Lead Channel Setting Sensing Sensitivity: 0.6 mV
MDC IDC MSMT BATTERY VOLTAGE: 3.03 V
MDC IDC MSMT LEADCHNL RA IMPEDANCE VALUE: 418 Ohm
MDC IDC MSMT LEADCHNL RA PACING THRESHOLD AMPLITUDE: 0.375 V
MDC IDC MSMT LEADCHNL RA PACING THRESHOLD PULSEWIDTH: 0.4 ms
MDC IDC MSMT LEADCHNL RV PACING THRESHOLD AMPLITUDE: 0.375 V
MDC IDC MSMT LEADCHNL RV PACING THRESHOLD PULSEWIDTH: 0.4 ms
MDC IDC MSMT LEADCHNL RV SENSING INTR AMPL: 3.125 mV
MDC IDC SESS DTM: 20160317042207
MDC IDC SET LEADCHNL RA PACING AMPLITUDE: 2 V
MDC IDC SET LEADCHNL RV PACING AMPLITUDE: 2.5 V
MDC IDC SET LEADCHNL RV PACING PULSEWIDTH: 0.4 ms
MDC IDC SET ZONE DETECTION INTERVAL: 350 ms
MDC IDC STAT BRADY AP VS PERCENT: 0.64 %
Zone Setting Detection Interval: 240 ms
Zone Setting Detection Interval: 270 ms
Zone Setting Detection Interval: 320 ms
Zone Setting Detection Interval: 450 ms

## 2014-10-08 ENCOUNTER — Encounter: Payer: Self-pay | Admitting: Cardiology

## 2014-10-17 ENCOUNTER — Encounter: Payer: Self-pay | Admitting: Internal Medicine

## 2015-03-02 ENCOUNTER — Encounter: Payer: Self-pay | Admitting: Internal Medicine

## 2015-05-13 ENCOUNTER — Telehealth (HOSPITAL_COMMUNITY): Payer: Self-pay | Admitting: Cardiology

## 2015-08-05 ENCOUNTER — Emergency Department (HOSPITAL_COMMUNITY): Payer: Medicare HMO

## 2015-08-05 ENCOUNTER — Encounter (HOSPITAL_COMMUNITY): Payer: Self-pay | Admitting: Emergency Medicine

## 2015-08-05 ENCOUNTER — Emergency Department (HOSPITAL_COMMUNITY)
Admission: EM | Admit: 2015-08-05 | Discharge: 2015-08-05 | Disposition: A | Discharge status: Home / Self Care | Payer: Medicare HMO | Attending: Emergency Medicine | Admitting: Emergency Medicine

## 2015-08-05 DIAGNOSIS — Z7982 Long term (current) use of aspirin: Secondary | ICD-10-CM | POA: Insufficient documentation

## 2015-08-05 DIAGNOSIS — M1990 Unspecified osteoarthritis, unspecified site: Secondary | ICD-10-CM | POA: Diagnosis not present

## 2015-08-05 DIAGNOSIS — I2510 Atherosclerotic heart disease of native coronary artery without angina pectoris: Secondary | ICD-10-CM | POA: Diagnosis not present

## 2015-08-05 DIAGNOSIS — Z853 Personal history of malignant neoplasm of breast: Secondary | ICD-10-CM | POA: Insufficient documentation

## 2015-08-05 DIAGNOSIS — Z880 Allergy status to penicillin: Secondary | ICD-10-CM | POA: Diagnosis not present

## 2015-08-05 DIAGNOSIS — F10120 Alcohol abuse with intoxication, uncomplicated: Secondary | ICD-10-CM | POA: Insufficient documentation

## 2015-08-05 DIAGNOSIS — Z79899 Other long term (current) drug therapy: Secondary | ICD-10-CM | POA: Diagnosis not present

## 2015-08-05 DIAGNOSIS — F10920 Alcohol use, unspecified with intoxication, uncomplicated: Secondary | ICD-10-CM

## 2015-08-05 DIAGNOSIS — F17210 Nicotine dependence, cigarettes, uncomplicated: Secondary | ICD-10-CM | POA: Insufficient documentation

## 2015-08-05 DIAGNOSIS — E785 Hyperlipidemia, unspecified: Secondary | ICD-10-CM | POA: Diagnosis not present

## 2015-08-05 DIAGNOSIS — I10 Essential (primary) hypertension: Secondary | ICD-10-CM | POA: Diagnosis not present

## 2015-08-05 DIAGNOSIS — R55 Syncope and collapse: Secondary | ICD-10-CM | POA: Diagnosis present

## 2015-08-05 LAB — BASIC METABOLIC PANEL
ANION GAP: 9 (ref 5–15)
BUN: 15 mg/dL (ref 6–20)
CALCIUM: 9.5 mg/dL (ref 8.9–10.3)
CHLORIDE: 112 mmol/L — AB (ref 101–111)
CO2: 23 mmol/L (ref 22–32)
Creatinine, Ser: 1.37 mg/dL — ABNORMAL HIGH (ref 0.44–1.00)
GFR calc Af Amer: 44 mL/min — ABNORMAL LOW (ref >60)
GFR, EST NON AFRICAN AMERICAN: 38 mL/min — AB (ref >60)
GLUCOSE: 78 mg/dL (ref 65–99)
Potassium: 4.5 mmol/L (ref 3.5–5.1)
Sodium: 144 mmol/L (ref 135–145)

## 2015-08-05 LAB — I-STAT TROPONIN, ED: Troponin i, poc: 0 ng/mL (ref 0.00–0.08)

## 2015-08-05 LAB — CBC WITH DIFFERENTIAL/PLATELET
BASOS ABS: 0.1 10*3/uL (ref 0.0–0.1)
Basophils Relative: 1 %
Eosinophils Absolute: 0.2 10*3/uL (ref 0.0–0.7)
Eosinophils Relative: 3 %
HEMATOCRIT: 33.3 % — AB (ref 36.0–46.0)
HEMOGLOBIN: 10.7 g/dL — AB (ref 12.0–15.0)
LYMPHS ABS: 2.3 10*3/uL (ref 0.7–4.0)
LYMPHS PCT: 42 %
MCH: 31.2 pg (ref 26.0–34.0)
MCHC: 32.1 g/dL (ref 30.0–36.0)
MCV: 97.1 fL (ref 78.0–100.0)
Monocytes Absolute: 0.4 10*3/uL (ref 0.1–1.0)
Monocytes Relative: 7 %
NEUTROS ABS: 2.5 10*3/uL (ref 1.7–7.7)
NEUTROS PCT: 47 %
Platelets: 261 10*3/uL (ref 150–400)
RBC: 3.43 MIL/uL — AB (ref 3.87–5.11)
RDW: 12.9 % (ref 11.5–15.5)
WBC: 5.4 10*3/uL (ref 4.0–10.5)

## 2015-08-05 LAB — I-STAT CG4 LACTIC ACID, ED: Lactic Acid, Venous: 3.46 mmol/L (ref 0.5–2.0)

## 2015-08-05 LAB — RAPID URINE DRUG SCREEN, HOSP PERFORMED
Amphetamines: NOT DETECTED
Barbiturates: NOT DETECTED
Benzodiazepines: NOT DETECTED
Cocaine: NOT DETECTED
Opiates: NOT DETECTED
TETRAHYDROCANNABINOL: NOT DETECTED

## 2015-08-05 LAB — URINALYSIS, ROUTINE W REFLEX MICROSCOPIC
Bilirubin Urine: NEGATIVE
GLUCOSE, UA: NEGATIVE mg/dL
Hgb urine dipstick: NEGATIVE
KETONES UR: NEGATIVE mg/dL
Nitrite: NEGATIVE
PROTEIN: NEGATIVE mg/dL
Specific Gravity, Urine: 1.008 (ref 1.005–1.030)
pH: 5.5 (ref 5.0–8.0)

## 2015-08-05 LAB — URINE MICROSCOPIC-ADD ON

## 2015-08-05 LAB — MAGNESIUM: Magnesium: 1.8 mg/dL (ref 1.7–2.4)

## 2015-08-05 LAB — ETHANOL: ALCOHOL ETHYL (B): 189 mg/dL — AB (ref <5)

## 2015-08-05 MED ORDER — SODIUM CHLORIDE 0.9 % IV BOLUS (SEPSIS)
1000.0000 mL | Freq: Once | INTRAVENOUS | Status: AC
  Administered 2015-08-05: 1000 mL via INTRAVENOUS

## 2015-08-05 NOTE — Discharge Instructions (Signed)
Alcohol Intoxication Ms. Feagan, you likely passed out because of alcohol use.  The rest of your workup was normal.  See your primary care doctor within 3 days for close management and do not drink large amounts of alcohol in the future.  If symptoms worsen, come back to the ED immediately. Thank you. Alcohol intoxication occurs when you drink enough alcohol that it affects your ability to function. It can be mild or very severe. Drinking a lot of alcohol in a short time is called binge drinking. This can be very harmful. Drinking alcohol can also be more dangerous if you are taking medicines or other drugs. Some of the effects caused by alcohol may include:  Loss of coordination.  Changes in mood and behavior.  Unclear thinking.  Trouble talking (slurred speech).  Throwing up (vomiting).  Confusion.  Slowed breathing.  Twitching and shaking (seizures).  Loss of consciousness. HOME CARE  Do not drive after drinking alcohol.  Drink enough water and fluids to keep your pee (urine) clear or pale yellow. Avoid caffeine.  Only take medicine as told by your doctor. GET HELP IF:  You throw up (vomit) many times.  You do not feel better after a few days.  You frequently have alcohol intoxication. Your doctor can help decide if you should see a substance use treatment counselor. GET HELP RIGHT AWAY IF:  You become shaky when you stop drinking.  You have twitching and shaking.  You throw up blood. It may look bright red or like coffee grounds.  You notice blood in your poop (bowel movements).  You become lightheaded or pass out (faint). MAKE SURE YOU:   Understand these instructions.  Will watch your condition.  Will get help right away if you are not doing well or get worse.   This information is not intended to replace advice given to you by your health care provider. Make sure you discuss any questions you have with your health care provider.   Document Released:  12/08/2007 Document Revised: 02/21/2013 Document Reviewed: 11/24/2012 Elsevier Interactive Patient Education [employer].

## 2015-08-05 NOTE — ED Provider Notes (Addendum)
CSN: [id number]     Arrival date & time 08/05/15  1058 History   First MD Initiated Contact with Patient 08/05/15 1102     Chief Complaint  Patient presents with  . Loss of Consciousness     (Consider location/radiation/quality/duration/timing/severity/associated sxs/prior Treatment) HPI  Dorothy Li is a 70 y.o. female with past medical history of ventricular tachycardia status post AICD placement, alcohol abuse presenting today with syncopal episode. Patient states she was at the gym doing a workout. She had a sudden onset of syncope. She denies any prodromal symptoms. She currently denies any chest pain, abdominal pain, back pain or headache. Patient states this has never happened to her in the past. No seizure-like activity was identified by bystanders per EMS. EMS notes that she is initially confused, systolic blood pressure was 50. They gave 500 mL of normal saline and blood pressure improved to 90/40. Patient currently has no complaints. She has no head or neck pain, there are no neurological symptoms.  10 Systems reviewed and are negative for acute change except as noted in the HPI.     Past Medical History  Diagnosis Date  . ETOH abuse      Elevated AST, mildly elevated INR (? component of cirrhosis). Has now quit ETOH.   [name] Hyperlipemia   . Cardiomyopathy     Echo (4/11) with EF 25%, diffuse hypokinesis, moderate LV dilation, decreased RV  systolic function but normal RV size, PA systolic pressure 35 mmHg. Lexiscan myoview (4/11): EF 29%,  diffuse hypokinesis, basal anteroseptal and apical scar, no ischemia.  The cardiomyopathy is likely due to a combination of CAD (LM stenosis) and ETOH abuse.   [name] CAD (coronary artery disease)     LHC (4/11) showed 75% ostial LM stenosis with significant damping upon catheter engagement, 60-70% mLAD, 60% ostial D1, EF 30%.  Patient had CABG with LIMA-LAD and SVG-OM2.   . Syncope     none since ICD placement  . Arthritis   . Stress fracture of  right foot 01/17/12    Dr. Vangie Bicker  . Rash     from tape   . VT (ventricular tachycardia) (HCC)     Dual chamber Medtronic ICD placed 7/11  . ICD (implantable cardiac defibrillator) in place Medtronic    Interrogated on 11/18/11  . Carotid stenosis     carotid dopplers (8/50) with 27-74% LICA stenosis.  Carotid dopplers (12/87): 86-76%  LICA stenosis.  . Nephrolithiasis   . ARF (acute renal failure) ([place])     in setting dehydration with ACE I use   . 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 HTN (hypertension)   . S/P radiation therapy 03/22/12 - 05/02/12    Right Breast: 50 gray/25Fractions with Boost of 10gray/5 Fractions  . Use of anastrozole (Arimidex) 05/12/12   Past Surgical History  Procedure Laterality Date  . Icd implantation  2011    ICD-Medtronic    Remote - No /  Hx of VT, LU chest  . Breast biopsy  12/08/11    Right Breast, Upper Outer Quadrant: Invasive Ductal:DCIS  . Coronary artery bypass graft  4/11     x 2  . Cardiac catheterization    . Breast lumpectomy  01-19-12    right lumpectomy/  . Abdominal hysterectomy      TAH-BSO, fibroids, age 73  . Colonoscopy  04/11/2012    Procedure: COLONOSCOPY;  Surgeon: Jerene Bears, MD;  Location: [facility];  Service: Gastroenterology;  Laterality: N/A;   Family History  Problem Relation Age of Onset  . Heart disease Sister     Had surgery  . Heart failure Sister   . Breast cancer Cousin   . Diabetes Mother   . Colon cancer Neg Hx    Social History  Substance Use Topics  . Smoking status: Former Smoker -- 1.00 packs/day for 40 years    Types: Cigarettes    Quit date: 10/08/2009  . Smokeless tobacco: None  . Alcohol Use: No     Comment: Prior hx 2 drinks daily.  quit 09/2009   OB History    Gravida Para Term Preterm AB TAB SAB Ectopic Multiple Living   1               Obstetric Comments   3 pregnancies and 0 births     Review of Systems    Allergies   Penicillins and Tape  Home Medications   Prior to Admission medications   Medication Sig Start Date End Date Taking? Authorizing Provider  aspirin EC 81 MG tablet Take 1 tablet (81 mg total) by mouth daily. 04/08/11  Yes Larey Dresser, MD  Calcium Carbonate-Vitamin D (CALCIUM 600-D) 600-400 MG-UNIT per tablet Take 1 tablet by mouth 2 (two) times daily.    Yes Historical Provider, MD  carvedilol (COREG) 25 MG tablet Take 1 tablet (25 mg total) by mouth 2 (two) times daily. 08/29/13  Yes Larey Dresser, MD  Cholecalciferol (VITAMIN D3) 3000 UNITS TABS Take 2,000 Units by mouth. Taking Vitamin D3 2000 units once daily   Yes Historical Provider, MD  enalapril (VASOTEC) 2.5 MG tablet Take 1 tablet (2.5 mg total) by mouth 2 (two) times daily. 08/29/13  Yes Larey Dresser, MD  hydrALAZINE (APRESOLINE) 50 MG tablet Take 25 mg by mouth 3 (three) times daily.   Yes Historical Provider, MD  rosuvastatin (CRESTOR) 40 MG tablet Take 1 tablet (40 mg total) by mouth daily. 03/26/14  Yes Larey Dresser, MD  spironolactone (ALDACTONE) 25 MG tablet Take 1 tablet (25 mg total) by mouth daily before breakfast. 08/29/13  Yes Larey Dresser, MD  anastrozole (ARIMIDEX) 1 MG tablet Take 1 tablet (1 mg total) by mouth daily. 07/27/13   Consuela Mimes, MD  hydrALAZINE (APRESOLINE) 50 MG tablet TAKE 1 TABLET BY MOUTH 3 (THREE) TIMES DAILY.    Larey Dresser, MD   BP 98/47 mmHg  Pulse 74  Temp(Src) 98.7 F (37.1 C) (Oral)  Resp 16  SpO2 100% Physical Exam  Constitutional: She is oriented to person, place, and time. She appears well-developed and well-nourished. No distress.  HENT:  Head: Normocephalic and atraumatic.  Nose: Nose normal.  Mouth/Throat: Oropharynx is clear and moist. No oropharyngeal exudate.  Eyes: Conjunctivae and EOM are normal. Pupils are equal, round, and reactive to light. No scleral icterus.  Neck: Normal range of motion. Neck supple. No JVD present. No tracheal deviation present. No  thyromegaly present.  Cardiovascular: Normal rate, regular rhythm and normal heart sounds.  Exam reveals no gallop and no friction rub.   No murmur heard. Pulmonary/Chest: Effort normal and breath sounds normal. No respiratory distress. She has no wheezes. She exhibits no tenderness.  Abdominal: Soft. Bowel sounds are normal. She exhibits no distension and no mass. There is no tenderness. There is no rebound and no guarding.  Musculoskeletal: Normal range of motion. She exhibits no edema or tenderness.  Lymphadenopathy:  She has no cervical adenopathy.  Neurological: She is alert and oriented to person, place, and time. No cranial nerve deficit. She exhibits normal muscle tone.  Normal strength and sensation in all extremities. Normal cerebellar testing.  Skin: Skin is warm and dry. No rash noted. No erythema. No pallor.  Nursing note and vitals reviewed.   ED Course  Procedures (including critical care time) Labs Review Labs Reviewed  CBC WITH DIFFERENTIAL/PLATELET - Abnormal; Notable for the following:    RBC 3.43 (*)    Hemoglobin 10.7 (*)    HCT 33.3 (*)    All other components within normal limits  BASIC METABOLIC PANEL - Abnormal; Notable for the following:    Chloride 112 (*)    Creatinine, Ser 1.37 (*)    GFR calc non Af Amer 38 (*)    GFR calc Af Amer 44 (*)    All other components within normal limits  URINALYSIS, ROUTINE W REFLEX MICROSCOPIC (NOT AT [facility]) - Abnormal; Notable for the following:    APPearance CLOUDY (*)    Leukocytes, UA MODERATE (*)    All other components within normal limits  ETHANOL - Abnormal; Notable for the following:    Alcohol, Ethyl (B) 189 (*)    All other components within normal limits  URINE MICROSCOPIC-ADD ON - Abnormal; Notable for the following:    Squamous Epithelial / LPF 0-5 (*)    Bacteria, UA FEW (*)    All other components within normal limits  I-STAT CG4 LACTIC ACID, ED - Abnormal; Notable for the following:    Lactic Acid,  Venous 3.46 (*)    All other components within normal limits  URINE RAPID DRUG SCREEN, HOSP PERFORMED  MAGNESIUM  I-STAT TROPOININ, ED    Imaging Review Dg Chest 2 View  08/05/2015  CLINICAL DATA:  Syncope today.  No other symptoms. EXAM: CHEST  2 VIEW COMPARISON:  01/12/2012 FINDINGS: There is no focal parenchymal opacity. There is no pleural effusion or pneumothorax. The heart and mediastinal contours are unremarkable. Dual lead cardiac pacemaker. There is evidence of prior CABG. The osseous structures are unremarkable. IMPRESSION: No active cardiopulmonary disease. Electronically Signed   By: Kathreen Devoid   On: 08/05/2015 12:54   Ct Head Wo Contrast  08/05/2015  CLINICAL DATA:  Syncope at the GM. Fell from standing position. Hit head. EXAM: CT HEAD WITHOUT CONTRAST TECHNIQUE: Contiguous axial images were obtained from the base of the skull through the vertex without intravenous contrast. COMPARISON:  None. FINDINGS: No acute intracranial abnormality. Specifically, no hemorrhage, hydrocephalus, mass lesion, acute infarction, or significant intracranial injury. No acute calvarial abnormality. Visualized paranasal sinuses and mastoids clear. Orbital soft tissues unremarkable. IMPRESSION: No acute intracranial abnormality. Electronically Signed   By: Rolm Baptise M.D.   On: 08/05/2015 12:28   I have personally reviewed and evaluated these images and lab results as part of my medical decision-making.   EKG Interpretation   Date/Time:  Tuesday August 05 2015 11:11:19 EST Ventricular Rate:  66 PR Interval:  123 QRS Duration: 103 QT Interval:  426 QTC Calculation: 446 R Axis:   25 Text Interpretation:  Atrial-paced complexes RSR' in V1 or V2, right VCD  or RVH No significant change since last tracing Confirmed by Glynn Octave [id number]) on 08/05/2015 12:01:14 PM      MDM   Final diagnoses:  None    Patient presents to emergency department for syncope. Broad workup was in pain,  EKG does not show  any acute cause for 60. Laboratory studies are unremarkable aside from an ethanol of 189. Likely the cause of the patient's syncopal episode. Lactate is elevated at 3.43 which can be seen on coronal abuse, she was given 1 L of IV fluids. Education was provided regarding alcohol cessation.  CT scan of the head was negative for any injury. She appears well and in no acute distress, she is clinically sober. Vital signs remain within her normal limits and she is safe for discharge.     Everlene Balls, MD 08/05/15 [phone]

## 2015-08-05 NOTE — ED Notes (Signed)
Per EMS, pt was doing aerobics at the gym when she had a witnessed syncopal episode. Pt was initially confused for EMS. No seizure like activity noted by bystanders. Pt was initially confused for EMS. Initial BP for EMS was 50 systolic palpated. EMS administered 462m of NS improving pts BP to 95/41. CBG: 87. Pt alert x4 at this time. NAD at this time.

## 2015-08-05 NOTE — ED Notes (Signed)
Pt ambulated to the bathroom with standby assistance. No complaints

## 2017-03-17 ENCOUNTER — Encounter (HOSPITAL_COMMUNITY): Payer: Self-pay | Admitting: Emergency Medicine

## 2017-03-17 ENCOUNTER — Emergency Department (HOSPITAL_COMMUNITY)
Admission: EM | Admit: 2017-03-17 | Discharge: 2017-03-17 | Disposition: A | Discharge status: Home / Self Care | Payer: Medicare HMO | Attending: Emergency Medicine | Admitting: Emergency Medicine

## 2017-03-17 DIAGNOSIS — Z853 Personal history of malignant neoplasm of breast: Secondary | ICD-10-CM | POA: Diagnosis not present

## 2017-03-17 DIAGNOSIS — Z5320 Procedure and treatment not carried out because of patient's decision for unspecified reasons: Secondary | ICD-10-CM | POA: Diagnosis not present

## 2017-03-17 DIAGNOSIS — S0083XA Contusion of other part of head, initial encounter: Secondary | ICD-10-CM | POA: Diagnosis not present

## 2017-03-17 DIAGNOSIS — Y939 Activity, unspecified: Secondary | ICD-10-CM | POA: Diagnosis not present

## 2017-03-17 DIAGNOSIS — I110 Hypertensive heart disease with heart failure: Secondary | ICD-10-CM | POA: Diagnosis not present

## 2017-03-17 DIAGNOSIS — Z951 Presence of aortocoronary bypass graft: Secondary | ICD-10-CM | POA: Diagnosis not present

## 2017-03-17 DIAGNOSIS — I2510 Atherosclerotic heart disease of native coronary artery without angina pectoris: Secondary | ICD-10-CM | POA: Diagnosis not present

## 2017-03-17 DIAGNOSIS — I5022 Chronic systolic (congestive) heart failure: Secondary | ICD-10-CM | POA: Diagnosis not present

## 2017-03-17 DIAGNOSIS — R55 Syncope and collapse: Secondary | ICD-10-CM | POA: Diagnosis present

## 2017-03-17 DIAGNOSIS — Z87891 Personal history of nicotine dependence: Secondary | ICD-10-CM | POA: Diagnosis not present

## 2017-03-17 DIAGNOSIS — W01198A Fall on same level from slipping, tripping and stumbling with subsequent striking against other object, initial encounter: Secondary | ICD-10-CM | POA: Insufficient documentation

## 2017-03-17 DIAGNOSIS — Z5329 Procedure and treatment not carried out because of patient's decision for other reasons: Secondary | ICD-10-CM

## 2017-03-17 DIAGNOSIS — Y92239 Unspecified place in hospital as the place of occurrence of the external cause: Secondary | ICD-10-CM | POA: Insufficient documentation

## 2017-03-17 DIAGNOSIS — Y998 Other external cause status: Secondary | ICD-10-CM | POA: Diagnosis not present

## 2017-03-17 DIAGNOSIS — Z7982 Long term (current) use of aspirin: Secondary | ICD-10-CM | POA: Insufficient documentation

## 2017-03-17 NOTE — ED Provider Notes (Signed)
[facility] DEPT Provider Note   CSN: [id number] Arrival date & time: 03/17/17  1638     History   Chief Complaint Chief Complaint  Patient presents with  . Fall    syncopal episode.     HPI Dorothy Li is a 71 y.o. female.  HPI  Patient presents after a fall that occurred out of our facility. The patient states that she was coming to visit her husband after having left the facility a few hours ago. She acknowledges drinking alcohol. It is unclear if she lost consciousness or not, but she does recall falling. She has some pain about the left superior lateral face, but no vision loss, no weakness in her extremities. No neck pain. EMS reports that the patient was awake and alert throughout transport.   Past Medical History:  Diagnosis Date  . ARF (acute renal failure) ([place])    in setting dehydration with ACE I use   . Arthritis   . 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 CAD (coronary artery disease)    LHC (4/11) showed 75% ostial LM stenosis with significant damping upon catheter engagement, 60-70% mLAD, 60% ostial D1, EF 30%.  Patient had CABG with LIMA-LAD and SVG-OM2.   . Cardiomyopathy    Echo (4/11) with EF 25%, diffuse hypokinesis, moderate LV dilation, decreased RV  systolic function but normal RV size, PA systolic pressure 35 mmHg. Lexiscan myoview (4/11): EF 29%,  diffuse hypokinesis, basal anteroseptal and apical scar, no ischemia.  The cardiomyopathy is likely due to a combination of CAD (LM stenosis) and ETOH abuse.   . Carotid stenosis    carotid dopplers (4/09) with 81-19% LICA stenosis.  Carotid dopplers (14/78): 29-56%  LICA stenosis.  [name] ETOH abuse     Elevated AST, mildly elevated INR (? component of cirrhosis). Has now quit ETOH.   . HTN (hypertension)   . Hyperlipemia   . 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 Nephrolithiasis   . Rash    from tape     . S/P radiation therapy 03/22/12 - 05/02/12   Right Breast: 50 gray/25Fractions with Boost of 10gray/5 Fractions  . Stress fracture of right foot 01/17/12   Dr. Vangie Bicker  . Syncope    none since ICD placement  . Use of anastrozole (Arimidex) 05/12/12  . VT (ventricular tachycardia) (HCC)    Dual chamber Medtronic ICD placed 7/11    Patient Active Problem List   Diagnosis Date Noted  . Colon polyps 04/11/2012  . Malignant neoplasm of upper-outer quadrant of female breast ([place]) 12/15/2011  . Breast cancer, right breast ([place]) 12/08/2011  . Breast cancer ([place]) 12/08/2011  . ARF (acute renal failure) ([place]) 02/04/2011  . Hypotension 01/26/2011  . PURE HYPERCHOLESTEROLEMIA 07/30/2010  . ICD-Medtronic 04/14/2010  . CAROTID ARTERY STENOSIS 02/11/2010  . CORONARY ATHEROSLERO UNSPEC TYPE BYPASS GRAFT 11/11/2009  . CAROTID BRUIT 11/11/2009  . Nonischemic cardiomyopathy/ CAD-CABG 10/08/2009  . CHRONIC SYSTOLIC HEART FAILURE 21/30/8657  . VENTRICULAR TACHYCARDIA 09/16/2009  . HYPERTENSION, UNSPECIFIED 09/05/2009  . SYNCOPE 09/05/2009    Past Surgical History:  Procedure Laterality Date  . ABDOMINAL HYSTERECTOMY     TAH-BSO, fibroids, age 50  . BREAST BIOPSY  12/08/11   Right Breast, Upper Outer Quadrant: Invasive Ductal:DCIS  . BREAST LUMPECTOMY  01-19-12   right lumpectomy/  . CARDIAC CATHETERIZATION    . COLONOSCOPY  04/11/2012   Procedure: COLONOSCOPY;  Surgeon: Jerene Bears, MD;  Location: [facility];  Service: Gastroenterology;  Laterality: N/A;  . CORONARY ARTERY BYPASS GRAFT  4/11    x 2  . ICD implantation  2011   ICD-Medtronic    Remote - No /  Hx of VT, LU chest    OB History    Gravida Para Term Preterm AB Living   1             SAB TAB Ectopic Multiple Live Births                  Obstetric Comments   3 pregnancies and 0 births       Home Medications    Prior to Admission medications   Medication Sig Start Date End Date Taking? Authorizing Provider   anastrozole (ARIMIDEX) 1 MG tablet Take 1 tablet (1 mg total) by mouth daily. 07/27/13   Marcy Panning, MD  aspirin EC 81 MG tablet Take 1 tablet (81 mg total) by mouth daily. 04/08/11   Larey Dresser, MD  Calcium Carbonate-Vitamin D (CALCIUM 600-D) 600-400 MG-UNIT per tablet Take 1 tablet by mouth 2 (two) times daily.     [provider]  carvedilol (COREG) 25 MG tablet Take 1 tablet (25 mg total) by mouth 2 (two) times daily. 08/29/13   Larey Dresser, MD  Cholecalciferol (VITAMIN D3) 3000 UNITS TABS Take 2,000 Units by mouth. Taking Vitamin D3 2000 units once daily    [provider]  enalapril (VASOTEC) 2.5 MG tablet Take 1 tablet (2.5 mg total) by mouth 2 (two) times daily. 08/29/13   Larey Dresser, MD  hydrALAZINE (APRESOLINE) 50 MG tablet TAKE 1 TABLET BY MOUTH 3 (THREE) TIMES DAILY.    Larey Dresser, MD  hydrALAZINE (APRESOLINE) 50 MG tablet Take 25 mg by mouth 3 (three) times daily.    [provider]  rosuvastatin (CRESTOR) 40 MG tablet Take 1 tablet (40 mg total) by mouth daily. 03/26/14   Larey Dresser, MD  sertraline (ZOLOFT) 50 MG tablet Take 50 mg by mouth daily. 06/02/15   [provider]  spironolactone (ALDACTONE) 25 MG tablet Take 1 tablet (25 mg total) by mouth daily before breakfast. 08/29/13   Larey Dresser, MD  traZODone (DESYREL) 50 MG tablet Take 50 mg by mouth daily. 06/02/15   [provider]    Family History Family History  Problem Relation Age of Onset  . Heart disease Sister        Had surgery  . Heart failure Sister   . Diabetes Mother   . Breast cancer Cousin   . Colon cancer Neg Hx     Social History Social History  Substance Use Topics  . Smoking status: Former Smoker    Packs/day: 1.00    Years: 40.00    Types: Cigarettes    Quit date: 10/08/2009  . Smokeless tobacco: Not on file  . Alcohol use No     Comment: Prior hx 2 drinks daily.  quit 09/2009     Allergies   Penicillins and  Tape   Review of Systems Review of Systems  Constitutional:       Per HPI, otherwise negative  HENT:       Per HPI, otherwise negative  Respiratory:       Per HPI, otherwise negative  Cardiovascular:       Per HPI, otherwise negative  Gastrointestinal: Negative for vomiting.  Endocrine:  Negative aside from HPI  Genitourinary:       Neg aside from HPI   Musculoskeletal:       Per HPI, otherwise negative  Skin: Negative.   Neurological: Positive for syncope (Unclear). Negative for dizziness and weakness.     Physical Exam Updated Vital Signs BP 130/69 (BP Location: Left Arm)   Pulse 91   Resp 14   Ht 5\' 7"  (1.702 m)   Wt 68.9 kg (152 lb)   SpO2 98%   BMI 23.81 kg/m   Physical Exam  Constitutional: She is oriented to person, place, and time. She appears well-developed and well-nourished. No distress.  Patient has cervical collar in place is presenting via EMS.  HENT:  Head: Normocephalic.    Mouth/Throat: Abnormal dentition.  Patient is edentulous  Eyes: Conjunctivae and EOM are normal.  Cardiovascular: Normal rate and regular rhythm.   Pulmonary/Chest: Effort normal and breath sounds normal. No stridor. No respiratory distress.  Abdominal: She exhibits no distension.  Musculoskeletal: She exhibits no edema.  Neurological: She is alert and oriented to person, place, and time. No cranial nerve deficit.  Skin: Skin is warm and dry.  Psychiatric: Her mood appears anxious.  Nursing note and vitals reviewed.    ED Treatments / Results  Labs (all labs ordered are listed, but only abnormal results are displayed) Labs Reviewed  ETHANOL  BASIC METABOLIC PANEL  CBC WITH DIFFERENTIAL/PLATELET     Procedures Procedures (including critical care time)  Medications Ordered in ED Medications - No data to display   Initial Impression / Assessment and Plan / ED Course  I have reviewed the triage vital signs and the nursing notes.  Pertinent labs & imaging  results that were available during my care of the patient were reviewed by me and considered in my medical decision making (see chart for details).    After the initial evaluation with concern for possible intoxication, and atraumatic effects given the multiple facial injuries, the patient was prepared for lab studies, radiographic studies. However, the patient absconded, without informing nursing staff, and was unable to be located.    Final Clinical Impressions(s) / ED Diagnoses  Fall, initial encounter   Carmin Muskrat, MD 03/17/17 1730

## 2017-03-17 NOTE — ED Triage Notes (Signed)
Per EMS pt was outside of hospital and fell. Pt told EMS she had been drinking today. Pt has a hematoma to the left side of the head. Pt is alert and oriented. Pressures 138.78, p 90, rr 18, cbg 105, 100%

## 2017-03-21 ENCOUNTER — Emergency Department (HOSPITAL_COMMUNITY): Payer: Medicare HMO

## 2017-03-21 ENCOUNTER — Encounter (HOSPITAL_COMMUNITY): Payer: Self-pay | Admitting: *Deleted

## 2017-03-21 ENCOUNTER — Emergency Department (HOSPITAL_COMMUNITY)
Admission: EM | Admit: 2017-03-21 | Discharge: 2017-03-21 | Disposition: A | Discharge status: Home / Self Care | Payer: Medicare HMO | Attending: Emergency Medicine | Admitting: Emergency Medicine

## 2017-03-21 DIAGNOSIS — Z951 Presence of aortocoronary bypass graft: Secondary | ICD-10-CM | POA: Insufficient documentation

## 2017-03-21 DIAGNOSIS — S0240DA Maxillary fracture, left side, initial encounter for closed fracture: Secondary | ICD-10-CM | POA: Diagnosis not present

## 2017-03-21 DIAGNOSIS — S0285XA Fracture of orbit, unspecified, initial encounter for closed fracture: Secondary | ICD-10-CM

## 2017-03-21 DIAGNOSIS — Y929 Unspecified place or not applicable: Secondary | ICD-10-CM | POA: Diagnosis not present

## 2017-03-21 DIAGNOSIS — Y999 Unspecified external cause status: Secondary | ICD-10-CM | POA: Insufficient documentation

## 2017-03-21 DIAGNOSIS — I5022 Chronic systolic (congestive) heart failure: Secondary | ICD-10-CM | POA: Insufficient documentation

## 2017-03-21 DIAGNOSIS — Z87891 Personal history of nicotine dependence: Secondary | ICD-10-CM | POA: Diagnosis not present

## 2017-03-21 DIAGNOSIS — W010XXA Fall on same level from slipping, tripping and stumbling without subsequent striking against object, initial encounter: Secondary | ICD-10-CM | POA: Insufficient documentation

## 2017-03-21 DIAGNOSIS — Y9389 Activity, other specified: Secondary | ICD-10-CM | POA: Diagnosis not present

## 2017-03-21 DIAGNOSIS — S0280XA Fracture of other specified skull and facial bones, unspecified side, initial encounter for closed fracture: Secondary | ICD-10-CM | POA: Diagnosis not present

## 2017-03-21 DIAGNOSIS — R05 Cough: Secondary | ICD-10-CM | POA: Diagnosis present

## 2017-03-21 DIAGNOSIS — Z853 Personal history of malignant neoplasm of breast: Secondary | ICD-10-CM | POA: Diagnosis not present

## 2017-03-21 DIAGNOSIS — I2510 Atherosclerotic heart disease of native coronary artery without angina pectoris: Secondary | ICD-10-CM | POA: Insufficient documentation

## 2017-03-21 DIAGNOSIS — I110 Hypertensive heart disease with heart failure: Secondary | ICD-10-CM | POA: Diagnosis not present

## 2017-03-21 LAB — COMPREHENSIVE METABOLIC PANEL
ALBUMIN: 3.7 g/dL (ref 3.5–5.0)
ALK PHOS: 73 U/L (ref 38–126)
ALT: 11 U/L — AB (ref 14–54)
ANION GAP: 8 (ref 5–15)
AST: 17 U/L (ref 15–41)
CALCIUM: 8.8 mg/dL — AB (ref 8.9–10.3)
CO2: 21 mmol/L — AB (ref 22–32)
Chloride: 108 mmol/L (ref 101–111)
Creatinine, Ser: 0.65 mg/dL (ref 0.44–1.00)
GFR calc Af Amer: >60 mL/min (ref >60)
GFR calc non Af Amer: >60 mL/min (ref >60)
GLUCOSE: 98 mg/dL (ref 65–99)
Potassium: 3.5 mmol/L (ref 3.5–5.1)
SODIUM: 137 mmol/L (ref 135–145)
Total Bilirubin: 0.7 mg/dL (ref 0.3–1.2)
Total Protein: 7 g/dL (ref 6.5–8.1)

## 2017-03-21 LAB — CBC WITH DIFFERENTIAL/PLATELET
BASOS ABS: 0.1 10*3/uL (ref 0.0–0.1)
BASOS PCT: 1 %
EOS ABS: 0.1 10*3/uL (ref 0.0–0.7)
Eosinophils Relative: 2 %
HCT: 32.3 % — ABNORMAL LOW (ref 36.0–46.0)
HEMOGLOBIN: 10.9 g/dL — AB (ref 12.0–15.0)
Lymphocytes Relative: 52 %
Lymphs Abs: 3 10*3/uL (ref 0.7–4.0)
MCH: 33 pg (ref 26.0–34.0)
MCHC: 33.7 g/dL (ref 30.0–36.0)
MCV: 97.9 fL (ref 78.0–100.0)
Monocytes Absolute: 0.4 10*3/uL (ref 0.1–1.0)
Monocytes Relative: 7 %
NEUTROS ABS: 2.2 10*3/uL (ref 1.7–7.7)
NEUTROS PCT: 38 %
Platelets: 239 10*3/uL (ref 150–400)
RBC: 3.3 MIL/uL — AB (ref 3.87–5.11)
RDW: 12.4 % (ref 11.5–15.5)
WBC: 5.7 10*3/uL (ref 4.0–10.5)

## 2017-03-21 LAB — MAGNESIUM: Magnesium: 1.6 mg/dL — ABNORMAL LOW (ref 1.7–2.4)

## 2017-03-21 MED ORDER — MAGNESIUM GLUCONATE 500 MG PO TABS
250.0000 mg | ORAL_TABLET | Freq: Once | ORAL | Status: DC
  Filled 2017-03-21: qty 1

## 2017-03-21 MED ORDER — ERYTHROMYCIN 5 MG/GM OP OINT: TOPICAL_OINTMENT | OPHTHALMIC | 1 refills | Status: DC

## 2017-03-21 MED ORDER — MAGNESIUM 30 MG PO TABS: 30.0000 mg | ORAL_TABLET | Freq: Two times a day (BID) | ORAL | 0 refills | Status: DC

## 2017-03-21 NOTE — ED Triage Notes (Signed)
Pt fell here on [place] 4-5 days ago when she came to visit husband.  Then patient was seen in the ED and want to leave.  She is back today cause she is coughing up blood and blowing it out of her nose.  Significant left orbital hematoma.

## 2017-03-21 NOTE — ED Provider Notes (Signed)
Emergency Department Provider Note   I have reviewed the triage vital signs and the nursing notes.   HISTORY  Chief Complaint blood clots from nose or throat   HPI Dorothy Li is a 71 y.o. female history of acute renal failure, coronary artery disease, cardiomyopathy, hypertension, syncope status post pacemaker and defibrillator. She had a fallapproximately 4 days agowith subsequent left cheek pain, left shoulder pain and left wrist pain. Patient initially came in the emergency room but then left before labs and CTs could be done. She returns today as she has been coughing up and having some bloody drainage from her nose to include clots. States this happens couple times a day and seems to get better and then comes back again. She has not taken any medicines for the pain. She is on aspirin but no other blood thinners. States compliant with all her medications. She states that she also hurt her left knee but that does not seem to bother her at this time. She is unsure if she syncopized when she fell but had been drinking, self-admittedly, alcohol that day and is unsteady while drinking.   Past Medical History:  Diagnosis Date  . ARF (acute renal failure) ([place])    in setting dehydration with ACE I use   . Arthritis   . 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 CAD (coronary artery disease)    LHC (4/11) showed 75% ostial LM stenosis with significant damping upon catheter engagement, 60-70% mLAD, 60% ostial D1, EF 30%.  Patient had CABG with LIMA-LAD and SVG-OM2.   . Cardiomyopathy    Echo (4/11) with EF 25%, diffuse hypokinesis, moderate LV dilation, decreased RV  systolic function but normal RV size, PA systolic pressure 35 mmHg. Lexiscan myoview (4/11): EF 29%,  diffuse hypokinesis, basal anteroseptal and apical scar, no ischemia.  The cardiomyopathy is likely due to a combination of CAD (LM stenosis) and ETOH abuse.   . Carotid stenosis      carotid dopplers (5/85) with 27-78% LICA stenosis.  Carotid dopplers (24/23): 53-61%  LICA stenosis.  [name] ETOH abuse     Elevated AST, mildly elevated INR (? component of cirrhosis). Has now quit ETOH.   . HTN (hypertension)   . Hyperlipemia   . 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 Nephrolithiasis   . Rash    from tape   . S/P radiation therapy 03/22/12 - 05/02/12   Right Breast: 50 gray/25Fractions with Boost of 10gray/5 Fractions  . Stress fracture of right foot 01/17/12   Dr. Vangie Bicker  . Syncope    none since ICD placement  . Use of anastrozole (Arimidex) 05/12/12  . VT (ventricular tachycardia) ([facility])    Dual chamber Medtronic ICD placed 7/11    Patient Active Problem List   Diagnosis Date Noted  . Colon polyps 04/11/2012  . 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 Breast cancer, right breast ([place]) 12/08/2011  . Breast cancer ([place]) 12/08/2011  . ARF (acute renal failure) ([place]) 02/04/2011  . Hypotension 01/26/2011  . PURE HYPERCHOLESTEROLEMIA 07/30/2010  . ICD-Medtronic 04/14/2010  . CAROTID ARTERY STENOSIS 02/11/2010  . CORONARY ATHEROSLERO UNSPEC TYPE BYPASS GRAFT 11/11/2009  . CAROTID BRUIT 11/11/2009  . Nonischemic cardiomyopathy/ CAD-CABG 10/08/2009  . CHRONIC SYSTOLIC HEART FAILURE 44/31/5400  . VENTRICULAR TACHYCARDIA 09/16/2009  . HYPERTENSION, UNSPECIFIED 09/05/2009  . SYNCOPE 09/05/2009  Past Surgical History:  Procedure Laterality Date  . ABDOMINAL HYSTERECTOMY     TAH-BSO, fibroids, age 71  . BREAST BIOPSY  12/08/11   Right Breast, Upper Outer Quadrant: Invasive Ductal:DCIS  . BREAST LUMPECTOMY  01-19-12   right lumpectomy/  . CARDIAC CATHETERIZATION    . COLONOSCOPY  04/11/2012   Procedure: COLONOSCOPY;  Surgeon: Jerene Bears, MD;  Location: [facility] END[facility]COPY;  Service: Gastroenterology;  Laterality: N/A;  . CORONARY ARTERY BYPASS GRAFT  4/11    x 2  . ICD implantation   2011   ICD-Medtronic    Remote - No /  Hx of VT, LU chest    Current Outpatient Rx  . Order #: [id number] Class: Normal  . Order #: [id number] Class: Historical Med  . Order #: [id number] Class: Historical Med  . Order #: [id number] Class: Normal  . Order #: [id number] Class: Historical Med  . Order #: [id number] Class: Normal  . Order #: [id number] Class: Print  . Order #: [id number] Class: Normal  . Order #: [id number] Class: Historical Med  . Order #: [id number] Class: Print  . Order #: [id number] Class: Normal  . Order #: [id number] Class: Historical Med  . Order #: [id number] Class: Normal  . Order #: [id number] Class: Historical Med    Allergies Penicillins and Tape  Family History  Problem Relation Age of Onset  . Heart disease Sister        Had surgery  . Heart failure Sister   . Diabetes Mother   . Breast cancer Cousin   . Colon cancer Neg Hx     Social History Social History  Substance Use Topics  . Smoking status: Former Smoker    Packs/day: 1.00    Years: 40.00    Types: Cigarettes    Quit date: 10/08/2009  . Smokeless tobacco: Never Used  . Alcohol use No     Comment: Prior hx 2 drinks daily.  quit 09/2009    Review of Systems  All other systems negative except as documented in the HPI. All pertinent positives and negatives as reviewed in the HPI. ____________________________________________   PHYSICAL EXAM:  VITAL SIGNS: ED Triage Vitals  Enc Vitals Group     BP 03/21/17 1530 138/88     Pulse Rate 03/21/17 1530 93     Resp 03/21/17 1530 20     Temp 03/21/17 1530 98.5 F (36.9 C)     Temp Source 03/21/17 1530 Oral     SpO2 03/21/17 1530 99 %     Weight --      Height --      Head Circumference --      Peak Flow --      Pain Score 03/21/17 1533 4     Pain Loc --      Pain Edu? --      Excl. in [place]? --     Constitutional: Alert and oriented. Well appearing and in no acute distress. Eyes: subconjunctival hemorrhage on the left, right is normal.. PERRL. EOMI. normal  vision. Head: large amount of ecchymosis and subconjunctival hemorrhage on her left eye. Nose: No congestion/rhinnorhea.No active bleeding. Mouth/Throat: Mucous membranes are moist.  Oropharynx non-erythematous. Neck: No stridor.  No meningeal signs.   Cardiovascular: Normal rate, regular rhythm. Good peripheral circulation. Grossly normal heart sounds.   Respiratory: Normal respiratory effort.  No retractions. Lungs CTAB. Gastrointestinal: Soft and nontender. No distention.  Musculoskeletal: No lower extremity tenderness nor edema. No gross deformities of extremities. Neurologic:  Normal speech and language. No gross focal neurologic deficits are appreciated.  Skin:  Skin is warm, dry and intact. No rash noted.   ____________________________________________   LABS (all labs ordered are listed, but only abnormal results are displayed)  Labs Reviewed  CBC WITH DIFFERENTIAL/PLATELET - Abnormal; Notable for the following:       Result Value   RBC 3.30 (*)    Hemoglobin 10.9 (*)    HCT 32.3 (*)    All other components within normal limits  COMPREHENSIVE METABOLIC PANEL - Abnormal; Notable for the following:    CO2 21 (*)    BUN <5 (*)    Calcium 8.8 (*)    ALT 11 (*)    All other components within normal limits  MAGNESIUM - Abnormal; Notable for the following:    Magnesium 1.6 (*)    All other components within normal limits   ____________________________________________  EKG   EKG Interpretation  Date/Time:  Monday March 21 2017 20:07:54 EDT Ventricular Rate:  77 PR Interval:  110 QRS Duration: 98 QT Interval:  440 QTC Calculation: 497 R Axis:   -7 Text Interpretation:  Sinus rhythm with short PR Nonspecific T wave abnormality Prolonged QT Abnormal ECG paced rhythm, similar changes to previous Confirmed by Merrily Pew [phone]) on 03/21/2017 9:14:21 PM       ____________________________________________  RADIOLOGY  Dg Wrist Complete Left  Result Date:  03/21/2017 CLINICAL DATA:  Pt c/o superior left shoulder pain and bruising and left wrist pain after falling in the ED today. No hx of prior injuries or surgeries to the affected areas. EXAM: LEFT WRIST - COMPLETE 3+ VIEW COMPARISON:  None. FINDINGS: No osseous fracture line or displaced fracture fragment seen. Proximal second through fourth metacarpal bones are difficult to visualize due to superimposition at the [facility] joints. 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 Adjacent soft tissues are unremarkable. IMPRESSION: 1. No acute findings.  No osseous fracture or dislocation seen. 2. 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 Electronically Signed   By: [place]i Cabot M.D.   On: 03/21/2017 19:05   Dg Shoulder Left  Result Date: 03/21/2017 CLINICAL DATA:  Pt c/o superior left shoulder pain and bruising and left wrist pain after falling in the ED today. No hx of prior injuries or surgeries to the affected areas. EXAM: LEFT SHOULDER - 2+ VIEW COMPARISON:  None. FINDINGS: There is no evidence of fracture or dislocation. There is no evidence of arthropathy or other focal bone abnormality. Soft tissues about the left shoulder are unremarkable. IMPRESSION: Negative. Electronically Signed   By: [place]i Cabot M.D.   On: 03/21/2017 19:05   Ct Maxillofacial Wo Contrast  Result Date: 03/21/2017 CLINICAL DATA:  Syncopal episode, fall, left facial injury 5 days ago, left orbital hematoma EXAM: CT MAXILLOFACIAL WITHOUT CONTRAST TECHNIQUE: Multidetector CT imaging of the maxillofacial structures was performed. Multiplanar CT image reconstructions were also generated. COMPARISON:  08/05/2015 FINDINGS: Osseous: Hemorrhage layering in the left maxillary sinus. Subtle acute nondisplaced fractures suspected of the lateral left maxillary wall and also the left inferior orbital wall, best  appreciated on the coronal reconstructions. No extraocular muscle entrapment appreciated. Mandible, pterygoid plates, right maxilla, zygomas, skullbase, and right orbit appear intact. Nasal bones and nasal septum appear intact. Orbits: The globes are symmetric. No significant proptosis. No retro-orbital hematoma. Sinuses: Other sinuses are clear. Hemorrhage layering in the left maxillary sinus. Small retention cyst in the right maxillary sinus. Soft tissues:  Minor left frontal orbital soft tissue swelling suspected extending over the left maxillary soft tissues. Limited intracranial: Brain atrophy evident. IMPRESSION: Suspect subtle acute nondisplaced fractures of the left lateral maxillary sidewall and the left inferior orbital wall with associated left maxillary sinus hemorrhage. Electronically Signed   By: Jerilynn Mages.  Shick M.D.   On: 03/21/2017 19:49    ____________________________________________   PROCEDURES  Procedure(s) performed:   Procedures   ____________________________________________   INITIAL IMPRESSION / ASSESSMENT AND PLAN / ED COURSE  Pertinent labs & imaging results that were available during my care of the patient were reviewed by me and considered in my medical decision making (see chart for details).  Suspect mechanical fall however she is at high risk for syncope with multiple medical problems. I will check an EKG, labs and CT her face. I suspected the bleeding essentially coming from a sinus fracture and blood in her sinuses that is subsequent draining appropriately. If all this is normal at the patient is stable for discharge.  Low magnesium (not much lower than most recent), will start on same at home, PCP follow up for recheck in a week.   ecg wihtout acute changes  Sinus fractures with blood in maxillary sinus. No e/o entrapment on exam or ct of left eye. Will give eye ointment, ophtho and ENT follow up.   ___________________________________________  FINAL CLINICAL  IMPRESSION(S) / ED DIAGNOSES  Final diagnoses:  Closed fracture of left side of maxilla, initial encounter ([place])  Closed fracture of orbit, initial encounter ([place])  Hypomagnesemia     MEDICATIONS GIVEN DURING THIS VISIT:  Medications  magnesium gluconate (MAGONATE) tablet 250 mg (not administered)     NEW OUTPATIENT MEDICATIONS STARTED DURING THIS VISIT:  New Prescriptions   ERYTHROMYCIN OPHTHALMIC OINTMENT    Place a 1/2 inch ribbon of ointment into the lower eyelid of left eye three times daily for one week.   MAGNESIUM 30 MG TABLET    Take 1 tablet (30 mg total) by mouth 2 (two) times daily.    Note:  This document was prepared using Dragon voice recognition software and may include unintentional dictation errors.   Merrily Pew, MD 03/21/17 2116

## 2017-03-21 NOTE — Discharge Instructions (Signed)
You have a fracture of the maxillary bone in your face and also of the bottom of your eye socket. I think this is causing bleeding into your sinus on the left which is draining in the back of her throat and into your nose to cause your symptoms. You need to follow-up with ear nose and throat doctor and also the eye doctor. If you do not have these already, call the phone number provided to you in these documents to schedule a follow-up appointment.

## 2017-11-04 ENCOUNTER — Emergency Department (HOSPITAL_COMMUNITY)
Admission: EM | Admit: 2017-11-04 | Discharge: 2017-11-04 | Disposition: A | Discharge status: Home / Self Care | Payer: Medicare HMO | Attending: Emergency Medicine | Admitting: Emergency Medicine

## 2017-11-04 ENCOUNTER — Encounter (HOSPITAL_COMMUNITY): Payer: Self-pay | Admitting: Emergency Medicine

## 2017-11-04 ENCOUNTER — Emergency Department (HOSPITAL_COMMUNITY): Payer: Medicare HMO

## 2017-11-04 DIAGNOSIS — Z7982 Long term (current) use of aspirin: Secondary | ICD-10-CM | POA: Diagnosis not present

## 2017-11-04 DIAGNOSIS — S20219A Contusion of unspecified front wall of thorax, initial encounter: Secondary | ICD-10-CM | POA: Insufficient documentation

## 2017-11-04 DIAGNOSIS — Y939 Activity, unspecified: Secondary | ICD-10-CM | POA: Insufficient documentation

## 2017-11-04 DIAGNOSIS — Y92410 Unspecified street and highway as the place of occurrence of the external cause: Secondary | ICD-10-CM | POA: Diagnosis not present

## 2017-11-04 DIAGNOSIS — Z87891 Personal history of nicotine dependence: Secondary | ICD-10-CM | POA: Diagnosis not present

## 2017-11-04 DIAGNOSIS — I10 Essential (primary) hypertension: Secondary | ICD-10-CM | POA: Diagnosis not present

## 2017-11-04 DIAGNOSIS — Z853 Personal history of malignant neoplasm of breast: Secondary | ICD-10-CM | POA: Insufficient documentation

## 2017-11-04 DIAGNOSIS — I2510 Atherosclerotic heart disease of native coronary artery without angina pectoris: Secondary | ICD-10-CM | POA: Diagnosis not present

## 2017-11-04 DIAGNOSIS — Z79899 Other long term (current) drug therapy: Secondary | ICD-10-CM | POA: Diagnosis not present

## 2017-11-04 DIAGNOSIS — Z951 Presence of aortocoronary bypass graft: Secondary | ICD-10-CM | POA: Diagnosis not present

## 2017-11-04 DIAGNOSIS — Y999 Unspecified external cause status: Secondary | ICD-10-CM | POA: Diagnosis not present

## 2017-11-04 DIAGNOSIS — S0083XA Contusion of other part of head, initial encounter: Secondary | ICD-10-CM | POA: Diagnosis not present

## 2017-11-04 DIAGNOSIS — S20309A Unspecified superficial injuries of unspecified front wall of thorax, initial encounter: Secondary | ICD-10-CM | POA: Diagnosis present

## 2017-11-04 LAB — BASIC METABOLIC PANEL
Anion gap: 11 (ref 5–15)
BUN: 7 mg/dL (ref 6–20)
CO2: 18 mmol/L — ABNORMAL LOW (ref 22–32)
Calcium: 8.7 mg/dL — ABNORMAL LOW (ref 8.9–10.3)
Chloride: 105 mmol/L (ref 101–111)
Creatinine, Ser: 0.65 mg/dL (ref 0.44–1.00)
GFR calc Af Amer: >60 mL/min (ref >60)
GFR calc non Af Amer: >60 mL/min (ref >60)
Glucose, Bld: 91 mg/dL (ref 65–99)
Potassium: 3.7 mmol/L (ref 3.5–5.1)
Sodium: 134 mmol/L — ABNORMAL LOW (ref 135–145)

## 2017-11-04 LAB — CBC
HCT: 28.6 % — ABNORMAL LOW (ref 36.0–46.0)
Hemoglobin: 9.7 g/dL — ABNORMAL LOW (ref 12.0–15.0)
MCH: 33.4 pg (ref 26.0–34.0)
MCHC: 33.9 g/dL (ref 30.0–36.0)
MCV: 98.6 fL (ref 78.0–100.0)
Platelets: 217 10*3/uL (ref 150–400)
RBC: 2.9 MIL/uL — ABNORMAL LOW (ref 3.87–5.11)
RDW: 12.3 % (ref 11.5–15.5)
WBC: 4.1 10*3/uL (ref 4.0–10.5)

## 2017-11-04 LAB — I-STAT TROPONIN, ED: Troponin i, poc: 0 ng/mL (ref 0.00–0.08)

## 2017-11-04 MED ORDER — OXYCODONE-ACETAMINOPHEN 5-325 MG PO TABS: 1.0000 | ORAL_TABLET | Freq: Four times a day (QID) | ORAL | 0 refills | Status: DC | PRN

## 2017-11-04 NOTE — ED Notes (Signed)
Results reviewed.  No changes in acuity at this time 
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
## 2017-11-04 NOTE — Discharge Instructions (Signed)
Your work-up in the emergency department today was reassuring and did not reveal a concerning cause of your symptoms.  We recommend that you ice to areas of pain to limit swelling and discomfort.  Do this 3-4 times per day.  You may take Percocet as prescribed for management of severe pain.  Do not take this medication with Tylenol as there is already Tylenol in this medication.  Follow-up with your primary care doctor to ensure resolution of symptoms.
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
## 2020-10-15 ENCOUNTER — Encounter (HOSPITAL_COMMUNITY): Payer: Self-pay | Admitting: Emergency Medicine

## 2020-10-15 ENCOUNTER — Emergency Department (HOSPITAL_COMMUNITY): Payer: No Typology Code available for payment source

## 2020-10-15 ENCOUNTER — Inpatient Hospital Stay (HOSPITAL_COMMUNITY)
Admission: EM | Admit: 2020-10-15 | Discharge: 2020-10-19 | DRG: 551 | Disposition: A | Discharge status: Home Health | Payer: No Typology Code available for payment source | Attending: Family Medicine | Admitting: Family Medicine

## 2020-10-15 ENCOUNTER — Other Ambulatory Visit: Payer: Self-pay

## 2020-10-15 DIAGNOSIS — Z880 Allergy status to penicillin: Secondary | ICD-10-CM

## 2020-10-15 DIAGNOSIS — Z7982 Long term (current) use of aspirin: Secondary | ICD-10-CM

## 2020-10-15 DIAGNOSIS — S22081A Stable burst fracture of T11-T12 vertebra, initial encounter for closed fracture: Principal | ICD-10-CM | POA: Diagnosis present

## 2020-10-15 DIAGNOSIS — E860 Dehydration: Secondary | ICD-10-CM | POA: Diagnosis not present

## 2020-10-15 DIAGNOSIS — Z8249 Family history of ischemic heart disease and other diseases of the circulatory system: Secondary | ICD-10-CM

## 2020-10-15 DIAGNOSIS — E872 Acidosis: Secondary | ICD-10-CM | POA: Diagnosis present

## 2020-10-15 DIAGNOSIS — J852 Abscess of lung without pneumonia: Secondary | ICD-10-CM | POA: Diagnosis present

## 2020-10-15 DIAGNOSIS — I959 Hypotension, unspecified: Secondary | ICD-10-CM | POA: Diagnosis present

## 2020-10-15 DIAGNOSIS — I2510 Atherosclerotic heart disease of native coronary artery without angina pectoris: Secondary | ICD-10-CM | POA: Diagnosis present

## 2020-10-15 DIAGNOSIS — Z91040 Latex allergy status: Secondary | ICD-10-CM

## 2020-10-15 DIAGNOSIS — I110 Hypertensive heart disease with heart failure: Secondary | ICD-10-CM | POA: Diagnosis present

## 2020-10-15 DIAGNOSIS — Z853 Personal history of malignant neoplasm of breast: Secondary | ICD-10-CM

## 2020-10-15 DIAGNOSIS — S2241XA Multiple fractures of ribs, right side, initial encounter for closed fracture: Secondary | ICD-10-CM | POA: Diagnosis present

## 2020-10-15 DIAGNOSIS — S2231XA Fracture of one rib, right side, initial encounter for closed fracture: Secondary | ICD-10-CM

## 2020-10-15 DIAGNOSIS — Z888 Allergy status to other drugs, medicaments and biological substances status: Secondary | ICD-10-CM

## 2020-10-15 DIAGNOSIS — E876 Hypokalemia: Principal | ICD-10-CM

## 2020-10-15 DIAGNOSIS — M1990 Unspecified osteoarthritis, unspecified site: Secondary | ICD-10-CM | POA: Diagnosis present

## 2020-10-15 DIAGNOSIS — T1490XA Injury, unspecified, initial encounter: Secondary | ICD-10-CM

## 2020-10-15 DIAGNOSIS — F1020 Alcohol dependence, uncomplicated: Secondary | ICD-10-CM | POA: Diagnosis present

## 2020-10-15 DIAGNOSIS — C50911 Malignant neoplasm of unspecified site of right female breast: Secondary | ICD-10-CM | POA: Diagnosis present

## 2020-10-15 DIAGNOSIS — I5022 Chronic systolic (congestive) heart failure: Secondary | ICD-10-CM | POA: Diagnosis not present

## 2020-10-15 DIAGNOSIS — R52 Pain, unspecified: Secondary | ICD-10-CM

## 2020-10-15 DIAGNOSIS — I10 Essential (primary) hypertension: Secondary | ICD-10-CM | POA: Diagnosis not present

## 2020-10-15 DIAGNOSIS — W19XXXA Unspecified fall, initial encounter: Secondary | ICD-10-CM

## 2020-10-15 DIAGNOSIS — R64 Cachexia: Secondary | ICD-10-CM | POA: Diagnosis present

## 2020-10-15 DIAGNOSIS — B9681 Helicobacter pylori [H. pylori] as the cause of diseases classified elsewhere: Secondary | ICD-10-CM | POA: Diagnosis present

## 2020-10-15 DIAGNOSIS — Z95810 Presence of automatic (implantable) cardiac defibrillator: Secondary | ICD-10-CM

## 2020-10-15 DIAGNOSIS — E162 Hypoglycemia, unspecified: Secondary | ICD-10-CM | POA: Diagnosis present

## 2020-10-15 DIAGNOSIS — Z79811 Long term (current) use of aromatase inhibitors: Secondary | ICD-10-CM

## 2020-10-15 DIAGNOSIS — E875 Hyperkalemia: Secondary | ICD-10-CM | POA: Diagnosis not present

## 2020-10-15 DIAGNOSIS — Z7983 Long term (current) use of bisphosphonates: Secondary | ICD-10-CM

## 2020-10-15 DIAGNOSIS — F10239 Alcohol dependence with withdrawal, unspecified: Secondary | ICD-10-CM | POA: Diagnosis not present

## 2020-10-15 DIAGNOSIS — E7800 Pure hypercholesterolemia, unspecified: Secondary | ICD-10-CM | POA: Diagnosis present

## 2020-10-15 DIAGNOSIS — Z9114 Patient's other noncompliance with medication regimen: Secondary | ICD-10-CM

## 2020-10-15 DIAGNOSIS — E871 Hypo-osmolality and hyponatremia: Secondary | ICD-10-CM | POA: Diagnosis present

## 2020-10-15 DIAGNOSIS — Z79899 Other long term (current) drug therapy: Secondary | ICD-10-CM

## 2020-10-15 DIAGNOSIS — I428 Other cardiomyopathies: Secondary | ICD-10-CM | POA: Diagnosis present

## 2020-10-15 DIAGNOSIS — R739 Hyperglycemia, unspecified: Secondary | ICD-10-CM | POA: Diagnosis not present

## 2020-10-15 DIAGNOSIS — Z951 Presence of aortocoronary bypass graft: Secondary | ICD-10-CM

## 2020-10-15 DIAGNOSIS — F10920 Alcohol use, unspecified with intoxication, uncomplicated: Secondary | ICD-10-CM

## 2020-10-15 DIAGNOSIS — Z681 Body mass index (BMI) 19 or less, adult: Secondary | ICD-10-CM

## 2020-10-15 DIAGNOSIS — Z20822 Contact with and (suspected) exposure to covid-19: Secondary | ICD-10-CM | POA: Diagnosis present

## 2020-10-15 DIAGNOSIS — D539 Nutritional anemia, unspecified: Secondary | ICD-10-CM | POA: Diagnosis present

## 2020-10-15 DIAGNOSIS — Z803 Family history of malignant neoplasm of breast: Secondary | ICD-10-CM

## 2020-10-15 DIAGNOSIS — Y908 Blood alcohol level of 240 mg/100 ml or more: Secondary | ICD-10-CM | POA: Diagnosis present

## 2020-10-15 DIAGNOSIS — Y92009 Unspecified place in unspecified non-institutional (private) residence as the place of occurrence of the external cause: Secondary | ICD-10-CM

## 2020-10-15 DIAGNOSIS — E785 Hyperlipidemia, unspecified: Secondary | ICD-10-CM | POA: Diagnosis present

## 2020-10-15 DIAGNOSIS — R296 Repeated falls: Secondary | ICD-10-CM

## 2020-10-15 DIAGNOSIS — Z923 Personal history of irradiation: Secondary | ICD-10-CM

## 2020-10-15 DIAGNOSIS — I5042 Chronic combined systolic (congestive) and diastolic (congestive) heart failure: Secondary | ICD-10-CM | POA: Diagnosis present

## 2020-10-15 DIAGNOSIS — Z87891 Personal history of nicotine dependence: Secondary | ICD-10-CM

## 2020-10-15 DIAGNOSIS — W1830XA Fall on same level, unspecified, initial encounter: Secondary | ICD-10-CM | POA: Diagnosis present

## 2020-10-15 DIAGNOSIS — F10220 Alcohol dependence with intoxication, uncomplicated: Secondary | ICD-10-CM | POA: Diagnosis present

## 2020-10-15 DIAGNOSIS — Z833 Family history of diabetes mellitus: Secondary | ICD-10-CM

## 2020-10-15 DIAGNOSIS — R627 Adult failure to thrive: Secondary | ICD-10-CM | POA: Diagnosis present

## 2020-10-15 LAB — COMPREHENSIVE METABOLIC PANEL
ALT: 41 U/L (ref 0–44)
AST: 63 U/L — ABNORMAL HIGH (ref 15–41)
Albumin: 2.7 g/dL — ABNORMAL LOW (ref 3.5–5.0)
Alkaline Phosphatase: 62 U/L (ref 38–126)
Anion gap: 12 (ref 5–15)
BUN: <5 mg/dL — ABNORMAL LOW (ref 8–23)
CO2: 17 mmol/L — ABNORMAL LOW (ref 22–32)
Calcium: 7.5 mg/dL — ABNORMAL LOW (ref 8.9–10.3)
Chloride: 102 mmol/L (ref 98–111)
Creatinine, Ser: 0.73 mg/dL (ref 0.44–1.00)
GFR, Estimated: >60 mL/min (ref >60)
Glucose, Bld: 72 mg/dL (ref 70–99)
Potassium: 2.6 mmol/L — CL (ref 3.5–5.1)
Sodium: 131 mmol/L — ABNORMAL LOW (ref 135–145)
Total Bilirubin: 0.7 mg/dL (ref 0.3–1.2)
Total Protein: 5.8 g/dL — ABNORMAL LOW (ref 6.5–8.1)

## 2020-10-15 LAB — CBC WITH DIFFERENTIAL/PLATELET
Abs Immature Granulocytes: 0.03 10*3/uL (ref 0.00–0.07)
Basophils Absolute: 0.1 10*3/uL (ref 0.0–0.1)
Basophils Relative: 3 %
Eosinophils Absolute: 0.1 10*3/uL (ref 0.0–0.5)
Eosinophils Relative: 2 %
HCT: 28.8 % — ABNORMAL LOW (ref 36.0–46.0)
Hemoglobin: 9.8 g/dL — ABNORMAL LOW (ref 12.0–15.0)
Immature Granulocytes: 1 %
Lymphocytes Relative: 30 %
Lymphs Abs: 1.3 10*3/uL (ref 0.7–4.0)
MCH: 35.4 pg — ABNORMAL HIGH (ref 26.0–34.0)
MCHC: 34 g/dL (ref 30.0–36.0)
MCV: 104 fL — ABNORMAL HIGH (ref 80.0–100.0)
Monocytes Absolute: 0.5 10*3/uL (ref 0.1–1.0)
Monocytes Relative: 13 %
Neutro Abs: 2.1 10*3/uL (ref 1.7–7.7)
Neutrophils Relative %: 51 %
Platelets: 236 10*3/uL (ref 150–400)
RBC: 2.77 MIL/uL — ABNORMAL LOW (ref 3.87–5.11)
RDW: 11.9 % (ref 11.5–15.5)
WBC: 4.1 10*3/uL (ref 4.0–10.5)
nRBC: 0 % (ref 0.0–0.2)

## 2020-10-15 LAB — LACTIC ACID, PLASMA
Lactic Acid, Venous: 2.8 mmol/L (ref 0.5–1.9)
Lactic Acid, Venous: 2.7 mmol/L (ref 0.5–1.9)

## 2020-10-15 LAB — MAGNESIUM: Magnesium: 1.3 mg/dL — ABNORMAL LOW (ref 1.7–2.4)

## 2020-10-15 LAB — URINALYSIS, ROUTINE W REFLEX MICROSCOPIC
Bilirubin Urine: NEGATIVE
Glucose, UA: NEGATIVE mg/dL
Hgb urine dipstick: NEGATIVE
Ketones, ur: NEGATIVE mg/dL
Leukocytes,Ua: NEGATIVE
Nitrite: NEGATIVE
Protein, ur: NEGATIVE mg/dL
Specific Gravity, Urine: 1.005 (ref 1.005–1.030)
pH: 6 (ref 5.0–8.0)

## 2020-10-15 LAB — TROPONIN I (HIGH SENSITIVITY)
Troponin I (High Sensitivity): 13 ng/L (ref <18)
Troponin I (High Sensitivity): 11 ng/L (ref <18)

## 2020-10-15 LAB — ETHANOL: Alcohol, Ethyl (B): 318 mg/dL (ref <10)

## 2020-10-15 LAB — BRAIN NATRIURETIC PEPTIDE: B Natriuretic Peptide: 72.7 pg/mL (ref 0.0–100.0)

## 2020-10-15 MED ORDER — ENOXAPARIN SODIUM 40 MG/0.4ML ~~LOC~~ SOLN
40.0000 mg | Freq: Every day | SUBCUTANEOUS | Status: DC
  Administered 2020-10-16 – 2020-10-19 (×4): 40 mg via SUBCUTANEOUS
  Filled 2020-10-15 (×4): qty 0.4

## 2020-10-15 MED ORDER — LACTATED RINGERS IV BOLUS: 1000.0000 mL | Freq: Once | INTRAVENOUS | Status: DC

## 2020-10-15 MED ORDER — PANTOPRAZOLE SODIUM 40 MG PO TBEC
40.0000 mg | DELAYED_RELEASE_TABLET | Freq: Two times a day (BID) | ORAL | Status: DC
  Administered 2020-10-16 – 2020-10-19 (×7): 40 mg via ORAL
  Filled 2020-10-15 (×7): qty 1

## 2020-10-15 MED ORDER — ADULT MULTIVITAMIN W/MINERALS CH
1.0000 | ORAL_TABLET | Freq: Every day | ORAL | Status: DC
  Administered 2020-10-16 – 2020-10-19 (×4): 1 via ORAL
  Filled 2020-10-15 (×4): qty 1

## 2020-10-15 MED ORDER — ACETAMINOPHEN 325 MG PO TABS
650.0000 mg | ORAL_TABLET | Freq: Four times a day (QID) | ORAL | Status: DC | PRN
  Administered 2020-10-17: 650 mg via ORAL
  Filled 2020-10-15: qty 2

## 2020-10-15 MED ORDER — POTASSIUM CHLORIDE CRYS ER 20 MEQ PO TBCR
20.0000 meq | EXTENDED_RELEASE_TABLET | Freq: Two times a day (BID) | ORAL | Status: DC
  Administered 2020-10-16 – 2020-10-17 (×4): 20 meq via ORAL
  Filled 2020-10-15 (×4): qty 1

## 2020-10-15 MED ORDER — MULTI-VITAMIN/MINERALS PO TABS: 1.0000 | ORAL_TABLET | Freq: Every day | ORAL | Status: DC

## 2020-10-15 MED ORDER — AMOXICILLIN 500 MG PO CAPS
1000.0000 mg | ORAL_CAPSULE | Freq: Two times a day (BID) | ORAL | Status: DC
  Administered 2020-10-16 – 2020-10-19 (×8): 1000 mg via ORAL
  Filled 2020-10-15 (×9): qty 2

## 2020-10-15 MED ORDER — CLARITHROMYCIN 500 MG PO TABS: 500.0000 mg | ORAL_TABLET | Freq: Two times a day (BID) | ORAL | Status: DC

## 2020-10-15 MED ORDER — IOHEXOL 300 MG/ML  SOLN
100.0000 mL | Freq: Once | INTRAMUSCULAR | Status: AC | PRN
  Administered 2020-10-15: 100 mL via INTRAVENOUS

## 2020-10-15 MED ORDER — THIAMINE HCL 100 MG PO TABS
100.0000 mg | ORAL_TABLET | Freq: Every day | ORAL | Status: DC
  Administered 2020-10-16 – 2020-10-19 (×4): 100 mg via ORAL
  Filled 2020-10-15 (×4): qty 1

## 2020-10-15 MED ORDER — ONDANSETRON HCL 4 MG PO TABS
4.0000 mg | ORAL_TABLET | Freq: Four times a day (QID) | ORAL | Status: DC | PRN
Start: 2020-10-15 — End: 2020-10-19

## 2020-10-15 MED ORDER — FOLIC ACID 1 MG PO TABS
1.0000 mg | ORAL_TABLET | Freq: Every day | ORAL | Status: DC
  Administered 2020-10-16 – 2020-10-19 (×4): 1 mg via ORAL
  Filled 2020-10-15 (×4): qty 1

## 2020-10-15 MED ORDER — POTASSIUM CHLORIDE CRYS ER 20 MEQ PO TBCR
40.0000 meq | EXTENDED_RELEASE_TABLET | Freq: Once | ORAL | Status: AC
Start: 2020-10-15 — End: 2020-10-15
  Administered 2020-10-15: 40 meq via ORAL
  Filled 2020-10-15: qty 2

## 2020-10-15 MED ORDER — ANASTROZOLE 1 MG PO TABS
1.0000 mg | ORAL_TABLET | Freq: Every day | ORAL | Status: DC
  Administered 2020-10-16 – 2020-10-19 (×4): 1 mg via ORAL
  Filled 2020-10-15 (×4): qty 1

## 2020-10-15 MED ORDER — LORAZEPAM 1 MG PO TABS
1.0000 mg | ORAL_TABLET | ORAL | Status: AC | PRN
Start: 2020-10-15 — End: 2020-10-18

## 2020-10-15 MED ORDER — ASPIRIN EC 81 MG PO TBEC
81.0000 mg | DELAYED_RELEASE_TABLET | Freq: Every day | ORAL | Status: DC
  Administered 2020-10-16 – 2020-10-19 (×4): 81 mg via ORAL
  Filled 2020-10-15 (×4): qty 1

## 2020-10-15 MED ORDER — VITAMIN B-12 1000 MCG PO TABS
1000.0000 ug | ORAL_TABLET | ORAL | Status: DC
  Administered 2020-10-16 – 2020-10-18 (×2): 1000 ug via ORAL
  Filled 2020-10-15 (×3): qty 1

## 2020-10-15 MED ORDER — METRONIDAZOLE 500 MG PO TABS
500.0000 mg | ORAL_TABLET | Freq: Two times a day (BID) | ORAL | Status: DC
  Administered 2020-10-16 – 2020-10-19 (×8): 500 mg via ORAL
  Filled 2020-10-15 (×9): qty 1

## 2020-10-15 MED ORDER — ALBUTEROL SULFATE (2.5 MG/3ML) 0.083% IN NEBU: 2.5000 mg | INHALATION_SOLUTION | RESPIRATORY_TRACT | Status: DC | PRN

## 2020-10-15 MED ORDER — LACTATED RINGERS IV BOLUS
500.0000 mL | Freq: Once | INTRAVENOUS | Status: AC
  Administered 2020-10-15: 500 mL via INTRAVENOUS

## 2020-10-15 MED ORDER — LORAZEPAM 2 MG/ML IJ SOLN
1.0000 mg | INTRAMUSCULAR | Status: AC | PRN
Start: 2020-10-15 — End: 2020-10-18
  Administered 2020-10-18: 2 mg via INTRAVENOUS
  Filled 2020-10-15: qty 1

## 2020-10-15 MED ORDER — METRONIDAZOLE 500 MG PO TABS: 500.0000 mg | ORAL_TABLET | Freq: Two times a day (BID) | ORAL | Status: DC

## 2020-10-15 MED ORDER — ACETAMINOPHEN 650 MG RE SUPP: 650.0000 mg | Freq: Four times a day (QID) | RECTAL | Status: DC | PRN

## 2020-10-15 MED ORDER — POTASSIUM CHLORIDE IN NACL 20-0.9 MEQ/L-% IV SOLN
INTRAVENOUS | Status: DC
  Filled 2020-10-15: qty 1000

## 2020-10-15 MED ORDER — CLARITHROMYCIN 500 MG PO TABS
500.0000 mg | ORAL_TABLET | Freq: Two times a day (BID) | ORAL | Status: DC
  Administered 2020-10-16 – 2020-10-19 (×8): 500 mg via ORAL
  Filled 2020-10-15 (×10): qty 1

## 2020-10-15 MED ORDER — ONDANSETRON HCL 4 MG/2ML IJ SOLN
4.0000 mg | Freq: Four times a day (QID) | INTRAMUSCULAR | Status: DC | PRN
  Administered 2020-10-18: 4 mg via INTRAVENOUS
  Filled 2020-10-15 (×2): qty 2

## 2020-10-15 MED ORDER — ATORVASTATIN CALCIUM 40 MG PO TABS
40.0000 mg | ORAL_TABLET | Freq: Every day | ORAL | Status: DC
  Administered 2020-10-16 – 2020-10-18 (×3): 40 mg via ORAL
  Filled 2020-10-15 (×3): qty 1

## 2020-10-15 MED ORDER — POLYETHYLENE GLYCOL 3350 17 G PO PACK: 17.0000 g | PACK | Freq: Every day | ORAL | Status: DC | PRN

## 2020-10-15 MED ORDER — MAGNESIUM SULFATE 2 GM/50ML IV SOLN
2.0000 g | Freq: Once | INTRAVENOUS | Status: AC
  Administered 2020-10-15: 2 g via INTRAVENOUS
  Filled 2020-10-15: qty 50

## 2020-10-15 MED ORDER — MAGNESIUM OXIDE 400 (241.3 MG) MG PO TABS
400.0000 mg | ORAL_TABLET | Freq: Two times a day (BID) | ORAL | Status: DC
  Administered 2020-10-16 – 2020-10-19 (×7): 400 mg via ORAL
  Filled 2020-10-15 (×7): qty 1

## 2020-10-15 MED ORDER — POTASSIUM CHLORIDE 10 MEQ/100ML IV SOLN
10.0000 meq | INTRAVENOUS | Status: AC
  Administered 2020-10-15 – 2020-10-16 (×3): 10 meq via INTRAVENOUS
  Filled 2020-10-15: qty 100

## 2020-10-15 NOTE — ED Notes (Signed)
Patient transported to X-ray 

## 2020-10-15 NOTE — ED Triage Notes (Signed)
Patient's niece reported that patient has not been eating for the past several days , drinking ETOH daily  , fell yesterday with right ribcage pain , fatigue and generalized weakness , hypotensive at triage .
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
## 2020-10-15 NOTE — H&P (Addendum)
History and Physical    Dorothy Li DTO:[id number] DOB: [birth year] DOA: 10/15/2020  PCP: [facility]   Patient coming from:  Home   I have personally briefly reviewed patient's old medical records in [place]  Chief Complaint: Generalized weakness, multiple falls, poor appetite, alcohol dependence  HPI: Dorothy Li is a 75 y.o. female with medical history significant of multivessel coronary artery disease, status post CABG, chronic congestive heart failure with reduced ejection fraction (EF 25%), history of VT, status post implanted cardioverter-defibrillator, history of breast cancer, hypertension, hyperlipidemia, alcohol dependence, was brought to hospital tonight for evaluation of general weakness, multiple falls.  According to patient's niece who is her POA, patient was hospitalized at [facility], for similar condition, where she was found to have severe dehydration with electrolyte abnormalities, in the setting of chronic alcohol dependence and poor oral intake.  She was found to have as well H. pylori infection and placed on quadruple therapy, with PPI, clarithromycin, amoxicillin and metronidazole.  Reportedly she was also found to have lung abscess, and was instructed to continue with Augmentin, upon completion of H. pylori treatment. While at home, patient continued to use alcohol, as she lives alone and has no supervision.  She developed again a generalized weakness and fatigue.  She had mechanical fall yesterday when she fell down and hurt her right side of chest, with subsequent ongoing pain in that area.  She continued to have poor oral intake.  Her niece decided to bring her again to hospital for evaluation. Patient herself appears to be inebriated on arrival.  Denies shortness of breath, palpitations, nausea or vomiting, abdominal pain, dysuria.  ED Course:   In the ED patient was markedly hypertensive on arrival, with blood pressure 77/48, which improved  after administration of fluids, up to 102/63.  Patient was afebrile, temperature 98.2 F.  No hypoxia, 100% room air. CBC showed mild macrocytic anemia with hemoglobin 9.8, MCV 104. Chemistry revealed normal renal function, with mild hyponatremia 131, hypokalemia 2.6 and hypomagnesemia 1.3. LFTs were mildly elevated with normal bilirubin and AST of 63.  Lactic acid was elevated 2.7.  Noted normal troponin. Urinalysis was negative for infection.  Alcohol levels of 318. CT head and cervical spine showed no acute abnormality specifically no fractures or intracranial pathology. CT chest abdomen pelvis is pending at present, ordered by ED to evaluate for any additional injury.  Patient received replacement of potassium magnesium and IV fluids and will be admitted to hospital for further evaluation and treatment  Review of Systems: ROS As per HPI otherwise all other systems reviewed and are negative.   Past Medical History:  Diagnosis Date  . ARF (acute renal failure) ([place])    in setting dehydration with ACE I use   . Arthritis   . 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 CAD (coronary artery disease)    LHC (4/11) showed 75% ostial LM stenosis with significant damping upon catheter engagement, 60-70% mLAD, 60% ostial D1, EF 30%.  Patient had CABG with LIMA-LAD and SVG-OM2.   . Cardiomyopathy    Echo (4/11) with EF 25%, diffuse hypokinesis, moderate LV dilation, decreased RV  systolic function but normal RV size, PA systolic pressure 35 mmHg. Lexiscan myoview (4/11): EF 29%,  diffuse hypokinesis, basal anteroseptal and apical scar, no ischemia.  The cardiomyopathy is likely due to a combination of CAD (LM stenosis) and ETOH abuse.   . Carotid stenosis  carotid dopplers (0/86) with 76-19% LICA stenosis.  Carotid dopplers (50/93): 26-71%  LICA stenosis.  [name] ETOH abuse     Elevated AST, mildly elevated INR (? component of cirrhosis). Has now quit ETOH.    . HTN (hypertension)   . Hyperlipemia   . 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 Nephrolithiasis   . Rash    from tape   . S/P radiation therapy 03/22/12 - 05/02/12   Right Breast: 50 gray/25Fractions with Boost of 10gray/5 Fractions  . Stress fracture of right foot 01/17/12   Dr. Vangie Bicker  . Syncope    none since ICD placement  . Use of anastrozole (Arimidex) 05/12/12  . VT (ventricular tachycardia) (HCC)    Dual chamber Medtronic ICD placed 7/11    Past Surgical History:  Procedure Laterality Date  . ABDOMINAL HYSTERECTOMY     TAH-BSO, fibroids, age 23  . BREAST BIOPSY  12/08/11   Right Breast, Upper Outer Quadrant: Invasive Ductal:DCIS  . BREAST LUMPECTOMY  01-19-12   right lumpectomy/  . CARDIAC CATHETERIZATION    . COLONOSCOPY  04/11/2012   Procedure: COLONOSCOPY;  Surgeon: Jerene Bears, MD;  Location: [facility];  Service: Gastroenterology;  Laterality: N/A;  . CORONARY ARTERY BYPASS GRAFT  4/11    x 2  . ICD implantation  2011   ICD-Medtronic    Remote - No /  Hx of VT, LU chest    Social History  reports that she quit smoking about 11 years ago. Her smoking use included cigarettes. She has a 40.00 pack-year smoking history. She has never used smokeless tobacco. She reports current alcohol use. She reports that she does not use drugs.  Allergies  Allergen Reactions  . Penicillins Other (See Comments)    "blacks out" per pt   . Latex Hives    gloves  . Tape Itching and Rash    Bandage on breast from bx caused rash    Family History  Problem Relation Age of Onset  . Heart disease Sister        Had surgery  . Heart failure Sister   . Diabetes Mother   . Breast cancer Cousin   . Colon cancer Neg Hx    Prior to Admission medications   Medication Sig Start Date End Date Taking? Authorizing Provider  acetaminophen (TYLENOL) 325 MG tablet Take 650 mg by mouth every 6 (six) hours as needed for moderate pain or  fever.   Yes [provider]  alendronate (FOSAMAX) 70 MG tablet Take 70 mg by mouth every Monday. Take with a full glass of water on an empty stomach.   Yes [provider]  amoxicillin (AMOXIL) 500 MG capsule Take 1,000 mg by mouth See admin instructions. 2 caps bid x 14 days   Yes [provider]  anastrozole (ARIMIDEX) 1 MG tablet Take 1 tablet (1 mg total) by mouth daily. 07/27/13  Yes Marcy Panning, MD  aspirin EC 81 MG tablet Take 1 tablet (81 mg total) by mouth daily. 04/08/11  Yes Larey Dresser, MD  atorvastatin (LIPITOR) 80 MG tablet Take 40 mg by mouth at bedtime.   Yes [provider]  Calcium-Vitamin D-Vitamin K 245-809-98 MG-UNT-MCG TABS Take 1 tablet by mouth daily.   Yes [provider]  carvedilol (COREG) 6.25 MG tablet Take 6.25 mg by mouth 2 (two) times daily with a meal.   Yes [provider]  Cholecalciferol (VITAMIN D3) 3000  UNITS TABS Take 2,000 Units by mouth. Taking Vitamin D3 2000 units once daily   Yes [provider]  enalapril (VASOTEC) 2.5 MG tablet Take 1 tablet (2.5 mg total) by mouth 2 (two) times daily. Patient taking differently: Take 2.5 mg by mouth at bedtime. 08/29/13  Yes Larey Dresser, MD  erythromycin base (E-MYCIN) 500 MG tablet Take 500 mg by mouth See admin instructions. Bid x 14 days   Yes [provider]  LACTOBACILLUS PROBIOTIC PO Take 1 capsule by mouth daily.   Yes [provider]  metroNIDAZOLE (FLAGYL) 250 MG tablet Take 500 mg by mouth See admin instructions. 2 tabs bid x 14 days   Yes [provider]  Multiple Vitamins-Minerals (MULTIVITAMIN WITH MINERALS) tablet Take 1 tablet by mouth daily.   Yes [provider]  pantoprazole (PROTONIX) 40 MG tablet Take 40 mg by mouth 2 (two) times daily before a meal.   Yes [provider]  sertraline (ZOLOFT) 100 MG tablet Take 100 mg by mouth daily.   Yes [provider]  traZODone  (DESYREL) 50 MG tablet Take 50 mg by mouth at bedtime. 06/02/15  Yes [provider]  vitamin B-12 (CYANOCOBALAMIN) 1000 MCG tablet Take 1,000 mcg by mouth every other day.   Yes [provider]  carvedilol (COREG) 25 MG tablet Take 1 tablet (25 mg total) by mouth 2 (two) times daily. Patient not taking: No sig reported 08/29/13   Larey Dresser, MD  erythromycin ophthalmic ointment Place a 1/2 inch ribbon of ointment into the lower eyelid of left eye three times daily for one week. Patient not taking: No sig reported 03/21/17   Mesner, Corene Cornea, MD  hydrALAZINE (APRESOLINE) 50 MG tablet TAKE 1 TABLET BY MOUTH 3 (THREE) TIMES DAILY. Patient not taking: No sig reported    Larey Dresser, MD  hydrALAZINE (APRESOLINE) 50 MG tablet Take 25 mg by mouth 3 (three) times daily. Patient not taking: No sig reported    [provider]  magnesium 30 MG tablet Take 1 tablet (30 mg total) by mouth 2 (two) times daily. Patient not taking: No sig reported 03/21/17   Mesner, Corene Cornea, MD  rosuvastatin (CRESTOR) 40 MG tablet Take 1 tablet (40 mg total) by mouth daily. Patient not taking: No sig reported 03/26/14   Larey Dresser, MD  sertraline (ZOLOFT) 50 MG tablet Take 50 mg by mouth daily. Patient not taking: No sig reported 06/02/15   [provider]  spironolactone (ALDACTONE) 25 MG tablet Take 1 tablet (25 mg total) by mouth daily before breakfast. Patient not taking: No sig reported 08/29/13   Larey Dresser, MD   Physical Exam: Vitals:   10/15/20 2018 10/15/20 2030 10/15/20 2102 10/15/20 2200  BP: (!) 77/48 (!) 113/55  102/63  Pulse:  85  89  Resp:  17  15  Temp:   98.2 F (36.8 C)   TempSrc:   Oral   SpO2:  100%  100%  Weight:      Height:        Constitutional: NAD, calm, comfortable.  Appears to be not in acute distress Vitals:   10/15/20 2018 10/15/20 2030 10/15/20 2102 10/15/20 2200  BP: (!) 77/48 (!) 113/55  102/63  Pulse:  85  89  Resp:  17  15   Temp:   98.2 F (36.8 C)   TempSrc:   Oral   SpO2:  100%  100%  Weight:  Height:       Eyes: PERRL, lids and conjunctivae normal ENMT: Mucous membranes are moist. Posterior pharynx clear of any exudate or lesions.Normal dentition.  Neck: normal, supple, no masses, no thyromegaly Respiratory: clear to auscultation bilaterally, no wheezing, no crackles. Normal respiratory effort. No accessory muscle use.  Cardiovascular: Regular rate and rhythm, no murmurs / rubs / gallops. No extremity edema. 2+ pedal pulses. No carotid bruits.  Abdomen: no tenderness, no masses palpated. No hepatosplenomegaly. Bowel sounds positive.  Musculoskeletal: no clubbing / cyanosis. No joint deformity upper and lower extremities. Good ROM, no contractures. Normal muscle tone.  Some tenderness to right-sided chest Skin: no rashes, lesions, ulcers. No induration Neurologic: CN 2-12 grossly intact. Sensation intact, DTR normal. Strength 5/5 in all 4.  Psychiatric: Normal judgment and insight. Alert and oriented x 3. Normal mood.   Labs on Admission: I have personally reviewed following labs and imaging studies  CBC: Recent Labs  Lab 10/15/20 2015  WBC 4.1  NEUTROABS 2.1  HGB 9.8*  HCT 28.8*  MCV 104.0*  PLT 010    Basic Metabolic Panel: Recent Labs  Lab 10/15/20 2015 10/15/20 2033  NA 131*  --   K 2.6*  --   CL 102  --   CO2 17*  --   GLUCOSE 72  --   BUN <5*  --   CREATININE 0.73  --   CALCIUM 7.5*  --   MG  --  1.3*    GFR: Estimated Creatinine Clearance: 53.6 mL/min (by C-G formula based on SCr of 0.73 mg/dL).  Liver Function Tests: Recent Labs  Lab 10/15/20 2015  AST 63*  ALT 41  ALKPHOS 62  BILITOT 0.7  PROT 5.8*  ALBUMIN 2.7*    Urine analysis:    Component Value Date/Time   COLORURINE YELLOW 10/15/2020 2248   APPEARANCEUR HAZY (A) 10/15/2020 2248   LABSPEC 1.005 10/15/2020 2248   PHURINE 6.0 10/15/2020 2248   GLUCOSEU NEGATIVE 10/15/2020 2248   HGBUR NEGATIVE  10/15/2020 2248   BILIRUBINUR NEGATIVE 10/15/2020 2248   KETONESUR NEGATIVE 10/15/2020 2248   PROTEINUR NEGATIVE 10/15/2020 2248   UROBILINOGEN 0.2 01/12/2012 0842   NITRITE NEGATIVE 10/15/2020 2248   LEUKOCYTESUR NEGATIVE 10/15/2020 2248    Radiological Exams on Admission: DG Ribs Unilateral W/Chest Right  Result Date: 10/15/2020 CLINICAL DATA:  [place], pain EXAM: RIGHT RIBS AND CHEST - 3+ VIEW COMPARISON:  11/04/2017 FINDINGS: Frontal view of the chest as well as oblique views of the right thoracic cage are obtained. Please note that the frontal view of the chest is labeled incorrectly, as previous exams demonstrate the pacemaker generator to be within the left chest wall. Pacer/AICD is unchanged. Cardiac silhouette is stable. No airspace disease, effusion, or pneumothorax. There is a minimally displaced right posterolateral seventh rib fracture, best seen on the frontal image. No other acute bony abnormalities. IMPRESSION: 1. Minimally displaced right posterolateral seventh rib fracture. 2. Otherwise no acute intrathoracic process. Electronically Signed   By: Randa Ngo M.D.   On: 10/15/2020 21:31   CT Head Wo Contrast  Result Date: 10/15/2020 CLINICAL DATA:  Patient's niece reported that patient has not been eating for the past several days , drinking ETOH daily , fell yesterday with right ribcage pain , fatigue and generalized weakness , hypotensive at triage . EXAM: CT HEAD WITHOUT CONTRAST CT CERVICAL SPINE WITHOUT CONTRAST TECHNIQUE: Multidetector CT imaging of the head and cervical spine was performed following the standard protocol without intravenous contrast. Multiplanar  CT image reconstructions of the cervical spine were also generated. COMPARISON:  None. FINDINGS: CT HEAD FINDINGS Brain: Cerebral ventricle sizes are concordant with the degree of cerebral volume loss. Patchy and confluent areas of decreased attenuation are noted throughout the deep and periventricular white matter of  the cerebral hemispheres bilaterally, compatible with chronic microvascular ischemic disease. No evidence of large-territorial acute infarction. No parenchymal hemorrhage. No mass lesion. No extra-axial collection. No mass effect or midline shift. No hydrocephalus. Basilar cisterns are patent. Vascular: No hyperdense vessel. Atherosclerotic calcifications are present within the cavernous internal carotid arteries. Skull: No acute fracture or focal lesion. Sinuses/Orbits: Paranasal sinuses and mastoid air cells are clear. The orbits are unremarkable. Other: None. CT CERVICAL SPINE FINDINGS Alignment: Normal. Skull base and vertebrae: No acute fracture. No aggressive appearing focal osseous lesion or focal pathologic process. Soft tissues and spinal canal: No prevertebral fluid or swelling. No visible canal hematoma. Upper chest: Emphysematous changes. Other: Mild to moderate atherosclerotic plaque of the carotid arteries in the neck. IMPRESSION: 1. No acute intracranial abnormality. 2. No acute displaced fracture or traumatic listhesis of the cervical spine. 3.  Emphysema (ICD10-J43.9). Electronically Signed   By: Iven Finn M.D.   On: 10/15/2020 22:03   CT Cervical Spine Wo Contrast  Result Date: 10/15/2020 CLINICAL DATA:  Patient's niece reported that patient has not been eating for the past several days , drinking ETOH daily , fell yesterday with right ribcage pain , fatigue and generalized weakness , hypotensive at triage . EXAM: CT HEAD WITHOUT CONTRAST CT CERVICAL SPINE WITHOUT CONTRAST TECHNIQUE: Multidetector CT imaging of the head and cervical spine was performed following the standard protocol without intravenous contrast. Multiplanar CT image reconstructions of the cervical spine were also generated. COMPARISON:  None. FINDINGS: CT HEAD FINDINGS Brain: Cerebral ventricle sizes are concordant with the degree of cerebral volume loss. Patchy and confluent areas of decreased attenuation are noted  throughout the deep and periventricular white matter of the cerebral hemispheres bilaterally, compatible with chronic microvascular ischemic disease. No evidence of large-territorial acute infarction. No parenchymal hemorrhage. No mass lesion. No extra-axial collection. No mass effect or midline shift. No hydrocephalus. Basilar cisterns are patent. Vascular: No hyperdense vessel. Atherosclerotic calcifications are present within the cavernous internal carotid arteries. Skull: No acute fracture or focal lesion. Sinuses/Orbits: Paranasal sinuses and mastoid air cells are clear. The orbits are unremarkable. Other: None. CT CERVICAL SPINE FINDINGS Alignment: Normal. Skull base and vertebrae: No acute fracture. No aggressive appearing focal osseous lesion or focal pathologic process. Soft tissues and spinal canal: No prevertebral fluid or swelling. No visible canal hematoma. Upper chest: Emphysematous changes. Other: Mild to moderate atherosclerotic plaque of the carotid arteries in the neck. IMPRESSION: 1. No acute intracranial abnormality. 2. No acute displaced fracture or traumatic listhesis of the cervical spine. 3.  Emphysema (ICD10-J43.9). Electronically Signed   By: Iven Finn M.D.   On: 10/15/2020 22:03    EKG: Independently reviewed.   EKG showed normal sinus rhythm at 88/min which mild ST-T changes in anterior lateral and inferior leads as well as prolonged QT of 481  Assessment/Plan Active Problems:   FTT (failure to thrive) in adult   Essential hypertension   Nonischemic cardiomyopathy/ CAD-CABG   Chronic systolic heart failure (HCC)   Implantable cardioverter-defibrillator (ICD) in situ   Pure hypercholesterolemia   Breast cancer, right breast (HCC)   Hypokalemia   Hypomagnesemia   EtOH dependence (HCC)   Hypotension in the setting of dehydration Hypokalemia Hypomagnesemia  Lactic acidosis Suspect due to poor oral intake and dehydration.  Replace potassium and magnesium  appropriately with IV and oral supplements. Continue IV fluids and monitor electrolytes closely  Chronic congestive heart failure with reduced ejection fraction (EF 25%) status post AICD Multivessel coronary artery disease, status post CABG Patient follows with [facility] cardiology. Upon review of her medication list, seems to be noncompliant with her cardiac medications. Regimen includes carvedilol, enalapril, hydralazine, spironolactone, which are on hold given hypotension. Consider resuming at discharge her regimen. Update 2D echocardiogram.  Failure to thrive in adult Multiple falls with concern for home safety Fracture of the seventh rib on the right Patient lives alone and continue to be alcohol dependence, with poor oral intake, multiple falls. Question safety with further risk of deterioration. Obtain PT evaluation, given frequent falls.  Continue pain control for rib fracture Obtain case management consultation for discharge planning.  History of breast cancer      continue anastrozole  Essential hypertension Upon arrival patient hypotensive, hence holding her antihypertensive medications. Continue IV fluids and reevaluate appropriate timing for resumption  Hyperlipidemia      Continue atorvastatin  Chronic alcohol dependence Patient continues to use alcohol daily, stated that she drinks several beers per day. Placed on alcohol withdrawal therapy with lorazepam. Continue thiamine, folic acid, multivitamins  H. pylori infection Patient was diagnosed with H. pylori infection during recent admission to [facility] She is on therapy with clarithromycin, amoxicillin, drometrizole and PPI which we will continue. She completed perhaps 2 or 3 days of therapy, with plans 2 weeks of treatment.  ?  Lung abscess Patient was told that she has spot on the lung, which requires nearly 6 weeks of antibiotics. Presumably Augmentin. CT chest abdomen pelvis is pending, to evaluate further  findings. May consider obtaining records from [facility], describing recent hospitalization   DVT prophylaxis: Heparin subcutaneously Code Status:        Full code Family Communication:  Updated niece by the bedside Disposition Plan:   Patient is from:  Home  Anticipated DC to:  Home versus rehab depends on the progression  Anticipated DC date:  16 to 72 hours  Anticipated DC barriers: Clinical progression of disease Consults called:  No consult requested Admission status:  Observation  Severity of Illness: The appropriate patient status for this patient is OBSERVATION. Observation status is judged to be reasonable and necessary in order to provide the required intensity of service to ensure the patient's safety. The patient's presenting symptoms, physical exam findings, and initial radiographic and laboratory data in the context of their medical condition is felt to place them at decreased risk for further clinical deterioration. Furthermore, it is anticipated that the patient will be medically stable for discharge from the hospital within 2 midnights of admission. The following factors support the patient status of observation.   " The patient's presenting symptoms include: Alcohol intoxication, weakness and fatigue " The physical exam findings include: Alcohol intoxication, confusion, right-sided rib fractures and pain " The initial radiographic and laboratory data are concerning for right-sided fractures, multiple electrolyte abnormalities with hypokalemia, hypomagnesemia  Allie Dimmer MD [facility]  How to contact the [facility] Attending or Consulting provider [place] or covering provider during after hours [place], for this patient?   1. Check the care team in [facility] and look for a) attending/consulting [facility] provider listed and b) the [facility] team listed 2. Log into [email or web address] and use [place]'s universal password to access. If you  do not have the password, please contact  the hospital operator. 3. Locate the [facility] provider you are looking for under [facility] and page to a number that you can be directly reached. 4. If you still have difficulty reaching the provider, please page the [facility] (Director on Call) for the Hospitalists listed on amion for assistance.  10/15/2020, 11:38 PM

## 2020-10-16 ENCOUNTER — Inpatient Hospital Stay (HOSPITAL_COMMUNITY): Payer: No Typology Code available for payment source

## 2020-10-16 DIAGNOSIS — E876 Hypokalemia: Secondary | ICD-10-CM | POA: Diagnosis present

## 2020-10-16 DIAGNOSIS — Y92009 Unspecified place in unspecified non-institutional (private) residence as the place of occurrence of the external cause: Secondary | ICD-10-CM | POA: Diagnosis not present

## 2020-10-16 DIAGNOSIS — Z681 Body mass index (BMI) 19 or less, adult: Secondary | ICD-10-CM | POA: Diagnosis not present

## 2020-10-16 DIAGNOSIS — E7800 Pure hypercholesterolemia, unspecified: Secondary | ICD-10-CM | POA: Diagnosis present

## 2020-10-16 DIAGNOSIS — R64 Cachexia: Secondary | ICD-10-CM | POA: Diagnosis present

## 2020-10-16 DIAGNOSIS — I959 Hypotension, unspecified: Secondary | ICD-10-CM | POA: Diagnosis present

## 2020-10-16 DIAGNOSIS — Z951 Presence of aortocoronary bypass graft: Secondary | ICD-10-CM | POA: Diagnosis not present

## 2020-10-16 DIAGNOSIS — M1990 Unspecified osteoarthritis, unspecified site: Secondary | ICD-10-CM | POA: Diagnosis present

## 2020-10-16 DIAGNOSIS — Z853 Personal history of malignant neoplasm of breast: Secondary | ICD-10-CM | POA: Diagnosis not present

## 2020-10-16 DIAGNOSIS — S2241XA Multiple fractures of ribs, right side, initial encounter for closed fracture: Secondary | ICD-10-CM | POA: Diagnosis present

## 2020-10-16 DIAGNOSIS — E872 Acidosis: Secondary | ICD-10-CM | POA: Diagnosis present

## 2020-10-16 DIAGNOSIS — I5022 Chronic systolic (congestive) heart failure: Secondary | ICD-10-CM

## 2020-10-16 DIAGNOSIS — W1830XA Fall on same level, unspecified, initial encounter: Secondary | ICD-10-CM | POA: Diagnosis present

## 2020-10-16 DIAGNOSIS — R627 Adult failure to thrive: Secondary | ICD-10-CM | POA: Diagnosis not present

## 2020-10-16 DIAGNOSIS — E871 Hypo-osmolality and hyponatremia: Secondary | ICD-10-CM | POA: Diagnosis present

## 2020-10-16 DIAGNOSIS — Z923 Personal history of irradiation: Secondary | ICD-10-CM | POA: Diagnosis not present

## 2020-10-16 DIAGNOSIS — Z95810 Presence of automatic (implantable) cardiac defibrillator: Secondary | ICD-10-CM | POA: Diagnosis not present

## 2020-10-16 DIAGNOSIS — I428 Other cardiomyopathies: Secondary | ICD-10-CM | POA: Diagnosis present

## 2020-10-16 DIAGNOSIS — R296 Repeated falls: Secondary | ICD-10-CM

## 2020-10-16 DIAGNOSIS — Z20822 Contact with and (suspected) exposure to covid-19: Secondary | ICD-10-CM | POA: Diagnosis present

## 2020-10-16 DIAGNOSIS — F10239 Alcohol dependence with withdrawal, unspecified: Secondary | ICD-10-CM | POA: Diagnosis not present

## 2020-10-16 DIAGNOSIS — Y908 Blood alcohol level of 240 mg/100 ml or more: Secondary | ICD-10-CM | POA: Diagnosis present

## 2020-10-16 DIAGNOSIS — S22081A Stable burst fracture of T11-T12 vertebra, initial encounter for closed fracture: Secondary | ICD-10-CM | POA: Diagnosis present

## 2020-10-16 DIAGNOSIS — E785 Hyperlipidemia, unspecified: Secondary | ICD-10-CM | POA: Diagnosis present

## 2020-10-16 DIAGNOSIS — E860 Dehydration: Secondary | ICD-10-CM | POA: Diagnosis present

## 2020-10-16 DIAGNOSIS — J852 Abscess of lung without pneumonia: Secondary | ICD-10-CM | POA: Diagnosis present

## 2020-10-16 DIAGNOSIS — F10220 Alcohol dependence with intoxication, uncomplicated: Secondary | ICD-10-CM | POA: Diagnosis not present

## 2020-10-16 DIAGNOSIS — I2510 Atherosclerotic heart disease of native coronary artery without angina pectoris: Secondary | ICD-10-CM | POA: Diagnosis present

## 2020-10-16 DIAGNOSIS — I110 Hypertensive heart disease with heart failure: Secondary | ICD-10-CM | POA: Diagnosis present

## 2020-10-16 LAB — RESP PANEL BY RT-PCR (FLU A&B, COVID) ARPGX2
Influenza A by PCR: NEGATIVE
Influenza B by PCR: NEGATIVE
SARS Coronavirus 2 by RT PCR: NEGATIVE

## 2020-10-16 LAB — CBC
HCT: 26.1 % — ABNORMAL LOW (ref 36.0–46.0)
Hemoglobin: 9.1 g/dL — ABNORMAL LOW (ref 12.0–15.0)
MCH: 35.8 pg — ABNORMAL HIGH (ref 26.0–34.0)
MCHC: 34.9 g/dL (ref 30.0–36.0)
MCV: 102.8 fL — ABNORMAL HIGH (ref 80.0–100.0)
Platelets: 217 10*3/uL (ref 150–400)
RBC: 2.54 MIL/uL — ABNORMAL LOW (ref 3.87–5.11)
RDW: 11.9 % (ref 11.5–15.5)
WBC: 4 10*3/uL (ref 4.0–10.5)
nRBC: 0 % (ref 0.0–0.2)

## 2020-10-16 LAB — BASIC METABOLIC PANEL
Anion gap: 12 (ref 5–15)
BUN: <5 mg/dL — ABNORMAL LOW (ref 8–23)
CO2: 18 mmol/L — ABNORMAL LOW (ref 22–32)
Calcium: 7.4 mg/dL — ABNORMAL LOW (ref 8.9–10.3)
Chloride: 105 mmol/L (ref 98–111)
Creatinine, Ser: 0.68 mg/dL (ref 0.44–1.00)
GFR, Estimated: >60 mL/min (ref >60)
Glucose, Bld: 44 mg/dL — CL (ref 70–99)
Potassium: 3.4 mmol/L — ABNORMAL LOW (ref 3.5–5.1)
Sodium: 135 mmol/L (ref 135–145)

## 2020-10-16 LAB — ECHOCARDIOGRAM COMPLETE
AR max vel: 2.6 cm2
AV Area VTI: 1.95 cm2
AV Area mean vel: 2.04 cm2
AV Mean grad: 2 mmHg
AV Peak grad: 3.2 mmHg
Ao pk vel: 0.89 m/s
Area-P 1/2: 3.23 cm2
Calc EF: 17.8 %
Height: 69 in
S' Lateral: 4.2 cm
Single Plane A2C EF: 15.7 %
Single Plane A4C EF: 19.7 %
Weight: 1940.05 oz

## 2020-10-16 LAB — MAGNESIUM: Magnesium: 1.7 mg/dL (ref 1.7–2.4)

## 2020-10-16 LAB — GLUCOSE, CAPILLARY
Glucose-Capillary: 46 mg/dL — ABNORMAL LOW (ref 70–99)
Glucose-Capillary: 172 mg/dL — ABNORMAL HIGH (ref 70–99)
Glucose-Capillary: 162 mg/dL — ABNORMAL HIGH (ref 70–99)
Glucose-Capillary: 127 mg/dL — ABNORMAL HIGH (ref 70–99)
Glucose-Capillary: 152 mg/dL — ABNORMAL HIGH (ref 70–99)
Glucose-Capillary: 138 mg/dL — ABNORMAL HIGH (ref 70–99)

## 2020-10-16 LAB — LACTIC ACID, PLASMA
Lactic Acid, Venous: 3.5 mmol/L (ref 0.5–1.9)
Lactic Acid, Venous: 4.3 mmol/L (ref 0.5–1.9)

## 2020-10-16 MED ORDER — DEXTROSE 50 % IV SOLN
INTRAVENOUS | Status: AC
  Administered 2020-10-16: 50 mL
  Filled 2020-10-16: qty 50

## 2020-10-16 MED ORDER — POTASSIUM CHLORIDE 2 MEQ/ML IV SOLN: INTRAVENOUS | Status: DC

## 2020-10-16 MED ORDER — OXYCODONE HCL 5 MG PO TABS
5.0000 mg | ORAL_TABLET | Freq: Four times a day (QID) | ORAL | Status: DC | PRN
Start: 2020-10-16 — End: 2020-10-19
  Administered 2020-10-16 – 2020-10-17 (×3): 5 mg via ORAL
  Filled 2020-10-16 (×3): qty 1

## 2020-10-16 MED ORDER — CARVEDILOL 6.25 MG PO TABS
6.2500 mg | ORAL_TABLET | Freq: Two times a day (BID) | ORAL | Status: DC
  Administered 2020-10-17 (×2): 6.25 mg via ORAL
  Filled 2020-10-16 (×2): qty 1

## 2020-10-16 MED ORDER — KCL IN DEXTROSE-NACL 20-5-0.9 MEQ/L-%-% IV SOLN
INTRAVENOUS | Status: DC
  Filled 2020-10-16 (×3): qty 1000

## 2020-10-16 MED ORDER — KCL IN DEXTROSE-NACL 20-5-0.9 MEQ/L-%-% IV SOLN
INTRAVENOUS | Status: DC
  Filled 2020-10-16: qty 1000

## 2020-10-16 NOTE — Progress Notes (Addendum)
PROGRESS NOTE    Dorothy Li  GYI:[id number] DOB: [birth year] DOA: 10/15/2020 PCP: [facility]   Brief Narrative:  HPI: Dorothy Li is a 75 y.o. female with medical history significant of multivessel coronary artery disease, status post CABG, chronic congestive heart failure with reduced ejection fraction (EF 25%), history of VT, status post implanted cardioverter-defibrillator, history of breast cancer, hypertension, hyperlipidemia, alcohol dependence, was brought to hospital tonight for evaluation of general weakness, multiple falls.  According to patient's niece who is her POA, patient was hospitalized at [facility] for similar condition, where she was found to have severe dehydration with electrolyte abnormalities, in the setting of chronic alcohol dependence and poor oral intake.  She was found to have as well H. pylori infection and placed on quadruple therapy, with PPI, clarithromycin, amoxicillin and metronidazole.  Reportedly she was also found to have lung abscess, and was instructed to continue with Augmentin, upon completion of H. pylori treatment. While at home, patient continued to use alcohol, as she lives alone and has no supervision.  She developed again a generalized weakness and fatigue.  She had mechanical fall yesterday when she fell down and hurt her right side of chest, with subsequent ongoing pain in that area.  She continued to have poor oral intake.  Her niece decided to bring her again to hospital for evaluation. Patient herself appears to be inebriated on arrival.  Denies shortness of breath, palpitations, nausea or vomiting, abdominal pain, dysuria.  ED Course:   In the ED patient was markedly hypertensive on arrival, with blood pressure 77/48, which improved after administration of fluids, up to 102/63.  Patient was afebrile, temperature 98.2 F.  No hypoxia, 100% room air. CBC showed mild macrocytic anemia with hemoglobin 9.8, MCV 104. Chemistry  revealed normal renal function, with mild hyponatremia 131, hypokalemia 2.6 and hypomagnesemia 1.3. LFTs were mildly elevated with normal bilirubin and AST of 63.  Lactic acid was elevated 2.7.  Noted normal troponin. Urinalysis was negative for infection.  Alcohol levels of 318. CT head and cervical spine showed no acute abnormality specifically no fractures or intracranial pathology. CT chest abdomen pelvis is pending at present, ordered by ED to evaluate for any additional injury.  Patient received replacement of potassium magnesium and IV fluids and will be admitted to hospital for further evaluation and treatment  Assessment & Plan:   Active Problems:   Essential hypertension   Nonischemic cardiomyopathy/ CAD-CABG   Chronic systolic heart failure (HCC)   Implantable cardioverter-defibrillator (ICD) in situ   Pure hypercholesterolemia   Breast cancer, right breast (HCC)   Hypokalemia   Hypomagnesemia   EtOH dependence (HCC)   FTT (failure to thrive) in adult   Hypotension in the setting of dehydration: Blood pressure better than yesterday.  Continue IV fluids but switch from dextrose 5% to dextrose 5% normal saline now that her blood sugar is improving.  Hypokalemia: 3.4.  Replace through IV fluids.  Hypomagnesemia: Resolved.  Recheck in the morning.  Hypoglycemia: Presented with blood sugar 44.  Likely due to poor p.o. intake.  Continue dextrose fluids and monitor closely.  Lactic acidosis: Suspect due to poor oral intake and dehydration.   Last lactic acid 2.7 yesterday.  Recheck again today.  Hepatic lobe lesion: CT abdomen pelvis shows 3.6 cm right hepatic lobe lesion.  MRI is recommended however patient has ICD which is MRI incompatible so unable to obtain MRI.  T12 vertebral body burst fracture: CT chest abdomen pelvis also  shows T12 vertebral body burst fracture.  Will obtain dedicated CT spine.  Will consult neurosurgery.  Bedrest for now until cleared by neurosurgery.   Might need TLSO brace.  Chronic congestive heart failure with reduced ejection fraction (EF 25%) status post AICD/Multivessel coronary artery disease, status post CABG: Patient follows with [place] [facility]. Upon review of her medication list, seems to be noncompliant with her cardiac medications. Regimen includes carvedilol, enalapril, hydralazine, spironolactone, which are on hold given hypotension. Consider resuming at discharge her regimen. Echo pending.  Failure to thrive in adult/ Multiple falls with concern for home safety/ Fracture of the seventh rib on the right Patient lives alone and continue to be alcohol dependence, with poor oral intake, multiple falls. Question safety with further risk of deterioration. Continue pain control for rib fracture, order and encouraged incentive spirometry. Will be seen by PT OT once cleared by neurosurgery.  History of breast cancer : continue anastrozole  Essential hypertension: Upon arrival patient hypotensive, hence holding her antihypertensive medications.  Continue IV fluids.  Hyperlipidemia: Continue atorvastatin  Chronic alcohol dependence/alcohol intoxication Patient continues to use alcohol daily, stated that she drinks several beers per day.Alcohol level 318 upon admission.  No signs of withdrawal at this point in time but continue CIWA protocol with as needed Ativan.  H. pylori infection: Patient was diagnosed with H. pylori infection during recent admission to [facility].  She was started on quadruple therapy with clarithromycin, amoxicillin, drometrizole and PPI which we will continue. She completed perhaps 2 or 3 days of therapy, with plans 2 weeks of treatment.  ?  Lung abscess Patient was told that she has spot on the lung, which requires nearly 6 weeks of antibiotics. Presumably Augmentin. CT chest abdomen pelvis is does show left lower lobe 1.7 cm cavitary lung lesion with associated left hilar lymphadenopathy,  consistent with possible abscess.  She is already on amoxicillin for H. pylori infection.  I have requested records from [place].   DVT prophylaxis: enoxaparin (LOVENOX) injection 40 mg Start: 10/16/20 1000   Code Status: Full Code  Family Communication: None present at bedside.  Plan of care discussed with patient in length and he verbalized understanding and agreed with it.  Status is: Observation  The patient will require care spanning > 2 midnights and should be moved to inpatient because: Inpatient level of care appropriate due to severity of illness  Dispo: The patient is from: Home              Anticipated d/c is to: SNF              Patient currently is not medically stable to d/c.   Difficult to place patient No        Estimated body mass index is 17.91 kg/m as calculated from the following:   Height as of this encounter: 5\' 9"  (1.753 m).   Weight as of this encounter: 55 kg.      Nutritional status:               Consultants:   none  Procedures:   none  Antimicrobials:  Anti-infectives (From admission, onward)   Start     Dose/Rate Route Frequency Ordered Stop   10/16/20 1000  clarithromycin (BIAXIN) tablet 500 mg  Status:  Discontinued        500 mg Oral Every 12 hours 10/15/20 2337 10/15/20 2348   10/16/20 1000  metroNIDAZOLE (FLAGYL) tablet 500 mg  Status:  Discontinued  500 mg Oral Every 12 hours 10/15/20 2337 10/15/20 2348   10/16/20 0000  amoxicillin (AMOXIL) capsule 1,000 mg        1,000 mg Oral Every 12 hours 10/15/20 2337     10/16/20 0000  clarithromycin (BIAXIN) tablet 500 mg        500 mg Oral Every 12 hours 10/15/20 2348     10/16/20 0000  metroNIDAZOLE (FLAGYL) tablet 500 mg        500 mg Oral Every 12 hours 10/15/20 2348           Subjective: Patient seen and examined.  She is fully alert and oriented.  Complains of mid back pain.  No other complaint.  No signs of alcohol withdrawal.  Objective: Vitals:   10/16/20  0219 10/16/20 0400 10/16/20 0743 10/16/20 0800  BP: (!) 106/51 (!) 96/43 (!) 107/52 (!) 107/52  Pulse: 79 78 88   Resp: 18 15 14    Temp: 97.8 F (36.6 C)  97.8 F (36.6 C)   TempSrc: Oral  Oral   SpO2: 98% 100% 100%   Weight:      Height:        Intake/Output Summary (Last 24 hours) at 10/16/2020 1036 Last data filed at 10/16/2020 0305 Gross per 24 hour  Intake 290 ml  Output --  Net 290 ml   Filed Weights   10/15/20 2014  Weight: 55 kg    Examination:  General exam: Appears calm and comfortable, looks cachectic  Respiratory system: Clear to auscultation. Respiratory effort normal. Cardiovascular system: S1 & S2 heard, RRR. No JVD, murmurs, rubs, gallops or clicks. No pedal edema. Gastrointestinal system: Abdomen is nondistended, soft and nontender. No organomegaly or masses felt. Normal bowel sounds heard. Central nervous system: Alert and oriented. No focal neurological deficits. Extremities: Symmetric 5 x 5 power. Skin: No rashes, lesions or ulcers Psychiatry: Judgement and insight appear normal. Mood & affect appropriate.    Data Reviewed: I have personally reviewed following labs and imaging studies  CBC: Recent Labs  Lab 10/15/20 2015 10/16/20 0139  WBC 4.1 4.0  NEUTROABS 2.1  --   HGB 9.8* 9.1*  HCT 28.8* 26.1*  MCV 104.0* 102.8*  PLT 236 086   Basic Metabolic Panel: Recent Labs  Lab 10/15/20 2015 10/15/20 2033 10/16/20 0139  NA 131*  --  135  K 2.6*  --  3.4*  CL 102  --  105  CO2 17*  --  18*  GLUCOSE 72  --  44*  BUN <5*  --  <5*  CREATININE 0.73  --  0.68  CALCIUM 7.5*  --  7.4*  MG  --  1.3* 1.7   GFR: Estimated Creatinine Clearance: 53.6 mL/min (by C-G formula based on SCr of 0.68 mg/dL). Liver Function Tests: Recent Labs  Lab 10/15/20 2015  AST 63*  ALT 41  ALKPHOS 62  BILITOT 0.7  PROT 5.8*  ALBUMIN 2.7*   No results for input(s): LIPASE, AMYLASE in the last 168 hours. No results for input(s): AMMONIA in the last 168  hours. Coagulation Profile: No results for input(s): INR, PROTIME in the last 168 hours. Cardiac Enzymes: No results for input(s): CKTOTAL, CKMB, CKMBINDEX, TROPONINI in the last 168 hours. BNP (last 3 results) No results for input(s): PROBNP in the last 8760 hours. HbA1C: No results for input(s): HGBA1C in the last 72 hours. CBG: Recent Labs  Lab 10/16/20 0309 10/16/20 0415  GLUCAP 46* 172*   Lipid Profile: No results for  input(s): CHOL, HDL, LDLCALC, TRIG, CHOLHDL, LDLDIRECT in the last 72 hours. Thyroid Function Tests: No results for input(s): TSH, T4TOTAL, FREET4, T3FREE, THYROIDAB in the last 72 hours. Anemia Panel: No results for input(s): VITAMINB12, FOLATE, FERRITIN, TIBC, IRON, RETICCTPCT in the last 72 hours. Sepsis Labs: Recent Labs  Lab 10/15/20 2033 10/15/20 2233  LATICACIDVEN 2.8* 2.7*    Recent Results (from the past 240 hour(s))  Resp Panel by RT-PCR (Flu A&B, Covid) Nasopharyngeal Swab     Status: None   Collection Time: 10/15/20 10:40 PM   Specimen: Nasopharyngeal Swab; Nasopharyngeal(NP) swabs in vial transport medium  Result Value Ref Range Status   SARS Coronavirus 2 by RT PCR NEGATIVE NEGATIVE Final    Comment: (NOTE) SARS-CoV-2 target nucleic acids are NOT DETECTED.  The SARS-CoV-2 RNA is generally detectable in upper respiratory specimens during the acute phase of infection. The lowest concentration of SARS-CoV-2 viral copies this assay can detect is 138 copies/mL. A negative result does not preclude SARS-Cov-2 infection and should not be used as the sole basis for treatment or other patient management decisions. A negative result may occur with  improper specimen collection/handling, submission of specimen other than nasopharyngeal swab, presence of viral mutation(s) within the areas targeted by this assay, and inadequate number of viral copies(<138 copies/mL). A negative result must be combined with clinical observations, patient history, and  epidemiological information. The expected result is Negative.  Fact Sheet for Patients:  EntrepreneurPulse.com.au  Fact Sheet for Healthcare Providers:  IncredibleEmployment.be  This test is no t yet approved or cleared by the [place] FDA and  has been authorized for detection and/or diagnosis of SARS-CoV-2 by FDA under an Emergency Use Authorization (EUA). This EUA will remain  in effect (meaning this test can be used) for the duration of the COVID-19 declaration under Section 564(b)(1) of the Act, 21 U.S.C.section 360bbb-3(b)(1), unless the authorization is terminated  or revoked sooner.       Influenza A by PCR NEGATIVE NEGATIVE Final   Influenza B by PCR NEGATIVE NEGATIVE Final    Comment: (NOTE) The Xpert Xpress SARS-CoV-2/FLU/RSV plus assay is intended as an aid in the diagnosis of influenza from Nasopharyngeal swab specimens and should not be used as a sole basis for treatment. Nasal washings and aspirates are unacceptable for Xpert Xpress SARS-CoV-2/FLU/RSV testing.  Fact Sheet for Patients: EntrepreneurPulse.com.au  Fact Sheet for Healthcare Providers: IncredibleEmployment.be  This test is not yet approved or cleared by the [place] FDA and has been authorized for detection and/or diagnosis of SARS-CoV-2 by FDA under an Emergency Use Authorization (EUA). This EUA will remain in effect (meaning this test can be used) for the duration of the COVID-19 declaration under Section 564(b)(1) of the Act, 21 U.S.C. section 360bbb-3(b)(1), unless the authorization is terminated or revoked.  Performed at [facility], [place] [address] [place], [place] [address]       Radiology Studies: DG Ribs Unilateral W/Chest Right  Result Date: 10/15/2020 CLINICAL DATA:  [place], pain EXAM: RIGHT RIBS AND CHEST - 3+ VIEW COMPARISON:  11/04/2017 FINDINGS: Frontal view of the chest as well as oblique  views of the right thoracic cage are obtained. Please note that the frontal view of the chest is labeled incorrectly, as previous exams demonstrate the pacemaker generator to be within the left chest wall. Pacer/AICD is unchanged. Cardiac silhouette is stable. No airspace disease, effusion, or pneumothorax. There is a minimally displaced right posterolateral seventh rib fracture, best seen on the frontal  image. No other acute bony abnormalities. IMPRESSION: 1. Minimally displaced right posterolateral seventh rib fracture. 2. Otherwise no acute intrathoracic process. Electronically Signed   By: Randa Ngo M.D.   On: 10/15/2020 21:31   CT Head Wo Contrast  Result Date: 10/15/2020 CLINICAL DATA:  Patient's niece reported that patient has not been eating for the past several days , drinking ETOH daily , fell yesterday with right ribcage pain , fatigue and generalized weakness , hypotensive at triage . EXAM: CT HEAD WITHOUT CONTRAST CT CERVICAL SPINE WITHOUT CONTRAST TECHNIQUE: Multidetector CT imaging of the head and cervical spine was performed following the standard protocol without intravenous contrast. Multiplanar CT image reconstructions of the cervical spine were also generated. COMPARISON:  None. FINDINGS: CT HEAD FINDINGS Brain: Cerebral ventricle sizes are concordant with the degree of cerebral volume loss. Patchy and confluent areas of decreased attenuation are noted throughout the deep and periventricular white matter of the cerebral hemispheres bilaterally, compatible with chronic microvascular ischemic disease. No evidence of large-territorial acute infarction. No parenchymal hemorrhage. No mass lesion. No extra-axial collection. No mass effect or midline shift. No hydrocephalus. Basilar cisterns are patent. Vascular: No hyperdense vessel. Atherosclerotic calcifications are present within the cavernous internal carotid arteries. Skull: No acute fracture or focal lesion. Sinuses/Orbits: Paranasal  sinuses and mastoid air cells are clear. The orbits are unremarkable. Other: None. CT CERVICAL SPINE FINDINGS Alignment: Normal. Skull base and vertebrae: No acute fracture. No aggressive appearing focal osseous lesion or focal pathologic process. Soft tissues and spinal canal: No prevertebral fluid or swelling. No visible canal hematoma. Upper chest: Emphysematous changes. Other: Mild to moderate atherosclerotic plaque of the carotid arteries in the neck. IMPRESSION: 1. No acute intracranial abnormality. 2. No acute displaced fracture or traumatic listhesis of the cervical spine. 3.  Emphysema (ICD10-J43.9). Electronically Signed   By: Iven Finn M.D.   On: 10/15/2020 22:03   CT Cervical Spine Wo Contrast  Result Date: 10/15/2020 CLINICAL DATA:  Patient's niece reported that patient has not been eating for the past several days , drinking ETOH daily , fell yesterday with right ribcage pain , fatigue and generalized weakness , hypotensive at triage . EXAM: CT HEAD WITHOUT CONTRAST CT CERVICAL SPINE WITHOUT CONTRAST TECHNIQUE: Multidetector CT imaging of the head and cervical spine was performed following the standard protocol without intravenous contrast. Multiplanar CT image reconstructions of the cervical spine were also generated. COMPARISON:  None. FINDINGS: CT HEAD FINDINGS Brain: Cerebral ventricle sizes are concordant with the degree of cerebral volume loss. Patchy and confluent areas of decreased attenuation are noted throughout the deep and periventricular white matter of the cerebral hemispheres bilaterally, compatible with chronic microvascular ischemic disease. No evidence of large-territorial acute infarction. No parenchymal hemorrhage. No mass lesion. No extra-axial collection. No mass effect or midline shift. No hydrocephalus. Basilar cisterns are patent. Vascular: No hyperdense vessel. Atherosclerotic calcifications are present within the cavernous internal carotid arteries. Skull: No acute  fracture or focal lesion. Sinuses/Orbits: Paranasal sinuses and mastoid air cells are clear. The orbits are unremarkable. Other: None. CT CERVICAL SPINE FINDINGS Alignment: Normal. Skull base and vertebrae: No acute fracture. No aggressive appearing focal osseous lesion or focal pathologic process. Soft tissues and spinal canal: No prevertebral fluid or swelling. No visible canal hematoma. Upper chest: Emphysematous changes. Other: Mild to moderate atherosclerotic plaque of the carotid arteries in the neck. IMPRESSION: 1. No acute intracranial abnormality. 2. No acute displaced fracture or traumatic listhesis of the cervical spine. 3.  Emphysema (ICD10-J43.9).  Electronically Signed   By: Iven Finn M.D.   On: 10/15/2020 22:03   CT CHEST ABDOMEN PELVIS W CONTRAST  Result Date: 10/16/2020 CLINICAL DATA:  Trauma. EXAM: CT CHEST, ABDOMEN, AND PELVIS WITH CONTRAST TECHNIQUE: Multidetector CT imaging of the chest, abdomen and pelvis was performed following the standard protocol during bolus administration of intravenous contrast. CONTRAST:  186mL OMNIPAQUE IOHEXOL 300 MG/ML  SOLN COMPARISON:  Chest x-ray 11/04/2017. FINDINGS: CHEST: Ports and Devices: Left chest wall 2 lead cardiac device. Lungs/airways: Bilateral lower lobe subsegmental atelectasis. Left lower lobe scarring versus atelectasis. Left lower lobe 1.7 cm cavitary lung lesion. Cluster of pulmonary micronodules measuring up to 5 mm in area within the right middle lobe (4:89). No focal consolidation. No pulmonary mass. No pulmonary contusion or laceration. The central airways are patent. Pleura: No pleural effusion. No pneumothorax. No hemothorax. Lymph Nodes: There is a 1.6 cm left hilar lymph node (3:30, 5:73). No mediastinal or axillary lymphadenopathy. Mediastinum: No pneumomediastinum. No aortic injury or mediastinal hematoma. The thoracic aorta is normal in caliber. Atherosclerotic plaque of the thoracic aorta as well as four-vessel coronary  artery calcifications status post CABG. The heart is normal in size. No significant pericardial effusion. The main pulmonary artery is normal in caliber. No central pulmonary embolus. The esophagus is unremarkable. Subcentimeter hypodense nodules within thyroid gland. Chest Wall / Breasts: No chest wall mass. Musculoskeletal: Subacute to chronic appearing nondisplaced right rib fractures. No acute displaced rib or sternal fracture. Sternotomy wires. Large Schmorl node of the T11 vertebral body with associated vertebral body height loss centrally. Fracture of the T12 vertebral body involving the anterior and posterior wall as well as superior endplate consistent with a burst fracture. 2 mm retropulsion into the central canal. No suspicious lytic or blastic osseous lesions. ABDOMEN / PELVIS: Liver: The left hepatic lobe is enlarged with suggestion of of a nodular hepatic contour. The hepatic parenchyma is diffusely hypodense compared to the splenic parenchyma consistent with fatty infiltration. No focal liver abnormality. There is a 3.6 cm hypodense right hepatic lobe lesion (3:51). No gallstones, gallbladder wall thickening, or pericholecystic fluid. No biliary dilatation. No laceration or subcapsular hematoma. Biliary System: The gallbladder is otherwise unremarkable with no radio-opaque gallstones. No biliary ductal dilatation. Pancreas: Normal pancreatic contour. No main pancreatic duct dilatation. Spleen: Not enlarged. No focal lesion. No laceration, subcapsular hematoma, or vascular injury. Adrenal Glands: No nodularity bilaterally. Kidneys: Bilateral kidneys enhance symmetrically. Fluid density lesions within bilateral kidneys likely represent simple renal cysts. Subcentimeter hypodensities are too small to characterize. There is a 1.8 cm left renal cystic lesion with a thin septation likely representing a minimally complex renal cyst. No hydronephrosis. No contusion, laceration, or subcapsular hematoma. No  injury to the vascular structures or collecting systems. No hydroureter. The urinary bladder is unremarkable. Bowel: No small or large bowel wall thickening or dilatation. Scattered colonic diverticulosis. The appendix not definitely identified. Mesentery, Omentum, and Peritoneum: No simple free fluid ascites. No pneumoperitoneum. No hemoperitoneum. No mesenteric hematoma identified. No organized fluid collection. Pelvic Organs: Normal. Lymph Nodes: No abdominal, pelvic, inguinal lymphadenopathy. Vasculature: Severe atherosclerotic plaque of the aorta and its branches. No abdominal aorta or iliac aneurysm. No active contrast extravasation or pseudoaneurysm. Musculoskeletal: No significant soft tissue hematoma. No suspicious lytic or blastic osseous lesion. No acute pelvic fracture. No spinal fracture. Avascular necrosis of the right femoral head. IMPRESSION: 1. Left lower lobe 1.7 cm cavitary lung lesion. Associated left hilar lymphadenopathy. Finding could represent infection versus malignancy. Additional  imaging evaluation or consultation with Pulmonology or Thoracic Surgery recommended 2. Question cirrhotic morphology with a 3.6 cm right hepatic lobe lesion. Recommend MRI liver protocol for further evaluation. 3. Age-indeterminate, possibly acute, burst fracture of the T12 vertebral body with 2 mm retropulsion into the central canal. Vertebral body height loss of the T11 vertebral body likely due to degenerative changes. Correlate with point tenderness to palpation for an acute component. Consider MRI lumbar spine for further evaluation. 4. Otherwise no acute traumatic injury to the chest, abdomen, or pelvis. 5. No acute fracture or traumatic malalignment of the lumbar spine. 6. Other imaging findings of potential clinical significance: Cluster of pulmonary nodules within the right middle lobe with area measuring up to 5 mm. Scattered colonic diverticulosis with no acute diverticulitis. Minimally complex left  renal cyst. Avascular necrosis of the right femoral head. Aortic Atherosclerosis (ICD10-I70.0). Electronically Signed   By: Iven Finn M.D.   On: 10/16/2020 00:02    Scheduled Meds: . amoxicillin  1,000 mg Oral Q12H  . anastrozole  1 mg Oral Daily  . aspirin EC  81 mg Oral Daily  . atorvastatin  40 mg Oral QHS  . clarithromycin  500 mg Oral Q12H  . enoxaparin (LOVENOX) injection  40 mg Subcutaneous Daily  . folic acid  1 mg Oral Daily  . magnesium oxide  400 mg Oral BID  . metroNIDAZOLE  500 mg Oral Q12H  . multivitamin with minerals  1 tablet Oral Daily  . pantoprazole  40 mg Oral BID  . potassium chloride  20 mEq Oral BID  . thiamine  100 mg Oral Daily  . vitamin B-12  1,000 mcg Oral QODAY   Continuous Infusions: . dextrose 5 % and 0.9 % NaCl with KCl 20 mEq/L 75 mL/hr at 10/16/20 0419     LOS: 0 days   Time spent: 39-minute   Darliss Cheney, MD [facility]  10/16/2020, 10:36 AM   To contact the attending provider between 7A-7P or the covering provider during after hours 7P-7A, please log into the web site [email or web address].

## 2020-10-16 NOTE — Progress Notes (Addendum)
PT Cancellation Note  Patient Details Name: Dorothy Li MRN: [id number] DOB: Nov 17, 1945   Cancelled Treatment:    Reason Eval/Treat Not Completed: Patient not medically ready  Noted CT abdomen/pelvis noted T12 burst fracture with retropulsion into spinal canal. Noted MRI has been ordered. Await results and ?possible bracing prior to initiating activity.    Addendum (1350) Noted plans to consult neurosurgery, ?brace, and currently on bedrest. Will follow-up 4/15 if medically appropriate.    Arby Barrette, PT Pager [phone]   Arby Barrette, PT Pager [phone]  Rexanne Mano 10/16/2020, 8:23 AM

## 2020-10-16 NOTE — Progress Notes (Signed)
0315 Pt CBG is 46. 1/2 amp of dextrose given. Pt stated that she wasn't going to eat/drink anything. I was able to get her to drink some orange juice. Will recheck sugar.   0345 On call doctor stated to give other half.   Please see New orders.

## 2020-10-16 NOTE — Consult Note (Signed)
Reason for Consult: T12 burst fracture Referring Physician: Dr. Joni Li is an 75 y.o. female.  HPI: Dorothy Li is a 75 year old individual who apparently sustained a fall at home.  The details of the fall are not clear to the patient as she does appear somewhat confused and this is verified by her niece who attends with her in the hospital room.  According to the history there is an apparent history of alcohol dependence confusion and generalized weakness with increasing falls.  It was noted on the CT scan of the chest and abdomen that the patient has a acute T12 fracture with some 2 mm of retropulsed bone in the superior portion of the canal she has an old T11 fracture with what appears to be a Schmorl's node formation the superior endplate.  The alignment of her spine is quite adequate.  Because the fracture does involve some the posterior elements it would be considered a stable burst fracture.  Clinically the patient denies any weakness in her legs she does note some rib pain.  Past Medical History:  Diagnosis Date  . ARF (acute renal failure) ([facility])    in setting dehydration with ACE I use   . Arthritis   . 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 CAD (coronary artery disease)    LHC (4/11) showed 75% ostial LM stenosis with significant damping upon catheter engagement, 60-70% mLAD, 60% ostial D1, EF 30%.  Patient had CABG with LIMA-LAD and SVG-OM2.   . Cardiomyopathy    Echo (4/11) with EF 25%, diffuse hypokinesis, moderate LV dilation, decreased RV  systolic function but normal RV size, PA systolic pressure 35 mmHg. Lexiscan myoview (4/11): EF 29%,  diffuse hypokinesis, basal anteroseptal and apical scar, no ischemia.  The cardiomyopathy is likely due to a combination of CAD (LM stenosis) and ETOH abuse.   . Carotid stenosis    carotid dopplers (6/21) with 30-86% LICA stenosis.  Carotid dopplers (57/84): 69-62%  LICA stenosis.  [name]  ETOH abuse     Elevated AST, mildly elevated INR (? component of cirrhosis). Has now quit ETOH.   . HTN (hypertension)   . Hyperlipemia   . 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 Nephrolithiasis   . Rash    from tape   . S/P radiation therapy 03/22/12 - 05/02/12   Right Breast: 50 gray/25Fractions with Boost of 10gray/5 Fractions  . Stress fracture of right foot 01/17/12   Dr. Vangie Bicker  . Syncope    none since ICD placement  . Use of anastrozole (Arimidex) 05/12/12  . VT (ventricular tachycardia) ([facility])    Dual chamber Medtronic ICD placed 7/11    Past Surgical History:  Procedure Laterality Date  . ABDOMINAL HYSTERECTOMY     TAH-BSO, fibroids, age 56  . BREAST BIOPSY  12/08/11   Right Breast, Upper Outer Quadrant: Invasive Ductal:DCIS  . BREAST LUMPECTOMY  01-19-12   right lumpectomy/  . CARDIAC CATHETERIZATION    . COLONOSCOPY  04/11/2012   Procedure: COLONOSCOPY;  Surgeon: Jerene Bears, MD;  Location: [facility];  Service: Gastroenterology;  Laterality: N/A;  . CORONARY ARTERY BYPASS GRAFT  4/11    x 2  . ICD implantation  2011   ICD-Medtronic    Remote - No /  Hx of VT, LU chest    Family History  Problem Relation Age of Onset  . Heart disease  Sister        Had surgery  . Heart failure Sister   . Diabetes Mother   . Breast cancer Cousin   . Colon cancer Neg Hx     Social History:  reports that she quit smoking about 11 years ago. Her smoking use included cigarettes. She has a 40.00 pack-year smoking history. She has never used smokeless tobacco. She reports current alcohol use. She reports that she does not use drugs.  Allergies:  Allergies  Allergen Reactions  . Penicillins Other (See Comments)    "blacks out" per pt   . Latex Hives    gloves  . Tape Itching and Rash    Bandage on breast from bx caused rash    Medications: I have reviewed the patient's current medications.  Results for orders placed or  performed during the hospital encounter of 10/15/20 (from the past 48 hour(s))  CBC with Differential     Status: Abnormal   Collection Time: 10/15/20  8:15 PM  Result Value Ref Range   WBC 4.1 4.0 - 10.5 K/uL   RBC 2.77 (L) 3.87 - 5.11 MIL/uL   Hemoglobin 9.8 (L) 12.0 - 15.0 g/dL   HCT 28.8 (L) 36.0 - 46.0 %   MCV 104.0 (H) 80.0 - 100.0 fL   MCH 35.4 (H) 26.0 - 34.0 pg   MCHC 34.0 30.0 - 36.0 g/dL   RDW 11.9 11.5 - 15.5 %   Platelets 236 150 - 400 K/uL   nRBC 0.0 0.0 - 0.2 %   Neutrophils Relative % 51 %   Neutro Abs 2.1 1.7 - 7.7 K/uL   Lymphocytes Relative 30 %   Lymphs Abs 1.3 0.7 - 4.0 K/uL   Monocytes Relative 13 %   Monocytes Absolute 0.5 0.1 - 1.0 K/uL   Eosinophils Relative 2 %   Eosinophils Absolute 0.1 0.0 - 0.5 K/uL   Basophils Relative 3 %   Basophils Absolute 0.1 0.0 - 0.1 K/uL   Immature Granulocytes 1 %   Abs Immature Granulocytes 0.03 0.00 - 0.07 K/uL    Comment: Performed at [facility], [address] [place], [place] [address]  Comprehensive metabolic panel     Status: Abnormal   Collection Time: 10/15/20  8:15 PM  Result Value Ref Range   Sodium 131 (L) 135 - 145 mmol/L   Potassium 2.6 (LL) 3.5 - 5.1 mmol/L    Comment: CRITICAL RESULT CALLED TO, READ BACK BY AND VERIFIED WITH: Denman George, RN. 2147 10/15/20 A. MCDOWELL    Chloride 102 98 - 111 mmol/L   CO2 17 (L) 22 - 32 mmol/L   Glucose, Bld 72 70 - 99 mg/dL    Comment: Glucose reference range applies only to samples taken after fasting for at least 8 hours.   BUN <5 (L) 8 - 23 mg/dL   Creatinine, Ser 0.73 0.44 - 1.00 mg/dL   Calcium 7.5 (L) 8.9 - 10.3 mg/dL   Total Protein 5.8 (L) 6.5 - 8.1 g/dL   Albumin 2.7 (L) 3.5 - 5.0 g/dL   AST 63 (H) 15 - 41 U/L   ALT 41 0 - 44 U/L   Alkaline Phosphatase 62 38 - 126 U/L   Total Bilirubin 0.7 0.3 - 1.2 mg/dL   GFR, Estimated >60 >60 mL/min    Comment: (NOTE) Calculated using the CKD-EPI Creatinine Equation (2021)    Anion gap 12 5 - 15    Comment:  Performed at [facility],  [address], [place], [place] [address]  Ethanol     Status: Abnormal   Collection Time: 10/15/20  8:15 PM  Result Value Ref Range   Alcohol, Ethyl (B) 318 (HH) <10 mg/dL    Comment: CRITICAL RESULT CALLED TO, READ BACK BY AND VERIFIED WITH:  W. MUNNETT,RN. 2131  10/15/20 A. MCDOWELL (NOTE) Lowest detectable limit for serum alcohol is 10 mg/dL.  For medical purposes only. Performed at [facility], [place] [address] [place], [place] [address]   Magnesium     Status: Abnormal   Collection Time: 10/15/20  8:33 PM  Result Value Ref Range   Magnesium 1.3 (L) 1.7 - 2.4 mg/dL    Comment: Performed at [facility] [address], [place], [place] [address]  Lactic acid, plasma     Status: Abnormal   Collection Time: 10/15/20  8:33 PM  Result Value Ref Range   Lactic Acid, Venous 2.8 (HH) 0.5 - 1.9 mmol/L    Comment: CRITICAL RESULT CALLED TO, READ BACK BY AND VERIFIED WITH:  W. MUNNETT,RN. 2131  10/15/20 A. MCDOWELL Performed at [facility], [place] [address], [place], [place] [address]   Troponin I (High Sensitivity)     Status: None   Collection Time: 10/15/20  8:33 PM  Result Value Ref Range   Troponin I (High Sensitivity) 13 <18 ng/L    Comment: (NOTE) Elevated high sensitivity troponin I (hsTnI) values and significant  changes across serial measurements may suggest ACS but many other  chronic and acute conditions are known to elevate hsTnI results.  Refer to the "Links" section for chest pain algorithms and additional  guidance. Performed at [facility], [place] [address], [place], [place] [address]   Brain natriuretic peptide     Status: None   Collection Time: 10/15/20 10:00 PM  Result Value Ref Range   B Natriuretic Peptide 72.7 0.0 - 100.0 pg/mL    Comment: Performed at [facility] [address], [place], [place] [address]  Lactic acid, plasma     Status: Abnormal   Collection Time: 10/15/20 10:33 PM   Result Value Ref Range   Lactic Acid, Venous 2.7 (HH) 0.5 - 1.9 mmol/L    Comment: CRITICAL VALUE NOTED.  VALUE IS CONSISTENT WITH PREVIOUSLY REPORTED AND CALLED VALUE. Performed at [facility], [place] [address], [place], [place] [address]   Troponin I (High Sensitivity)     Status: None   Collection Time: 10/15/20 10:37 PM  Result Value Ref Range   Troponin I (High Sensitivity) 11 <18 ng/L    Comment: (NOTE) Elevated high sensitivity troponin I (hsTnI) values and significant  changes across serial measurements may suggest ACS but many other  chronic and acute conditions are known to elevate hsTnI results.  Refer to the "Links" section for chest pain algorithms and additional  guidance. Performed at [facility], [place] [address] [place],  [address]   Resp Panel by RT-PCR (Flu A&B, Covid) Nasopharyngeal Swab     Status: None   Collection Time: 10/15/20 10:40 PM   Specimen: Nasopharyngeal Swab; Nasopharyngeal(NP) swabs in vial transport medium  Result Value Ref Range   SARS Coronavirus 2 by RT PCR NEGATIVE NEGATIVE    Comment: (NOTE) SARS-CoV-2 target nucleic acids are NOT DETECTED.  The SARS-CoV-2 RNA is generally detectable in upper respiratory specimens during the acute phase of infection. The lowest concentration of SARS-CoV-2 viral copies this assay can  detect is 138 copies/mL. A negative result does not preclude SARS-Cov-2 infection and should not be used as the sole basis for treatment or other patient management decisions. A negative result may occur with  improper specimen collection/handling, submission of specimen other than nasopharyngeal swab, presence of viral mutation(s) within the areas targeted by this assay, and inadequate number of viral copies(<138 copies/mL). A negative result must be combined with clinical observations, patient history, and epidemiological information. The expected result is Negative.  Fact Sheet for Patients:   EntrepreneurPulse.com.au  Fact Sheet for Healthcare Providers:  IncredibleEmployment.be  This test is no t yet approved or cleared by the [place] FDA and  has been authorized for detection and/or diagnosis of SARS-CoV-2 by FDA under an Emergency Use Authorization (EUA). This EUA will remain  in effect (meaning this test can be used) for the duration of the COVID-19 declaration under Section 564(b)(1) of the Act, 21 U.S.C.section 360bbb-3(b)(1), unless the authorization is terminated  or revoked sooner.       Influenza A by PCR NEGATIVE NEGATIVE   Influenza B by PCR NEGATIVE NEGATIVE    Comment: (NOTE) The Xpert Xpress SARS-CoV-2/FLU/RSV plus assay is intended as an aid in the diagnosis of influenza from Nasopharyngeal swab specimens and should not be used as a sole basis for treatment. Nasal washings and aspirates are unacceptable for Xpert Xpress SARS-CoV-2/FLU/RSV testing.  Fact Sheet for Patients: EntrepreneurPulse.com.au  Fact Sheet for Healthcare Providers: IncredibleEmployment.be  This test is not yet approved or cleared by the [place] FDA and has been authorized for detection and/or diagnosis of SARS-CoV-2 by FDA under an Emergency Use Authorization (EUA). This EUA will remain in effect (meaning this test can be used) for the duration of the COVID-19 declaration under Section 564(b)(1) of the Act, 21 U.S.C. section 360bbb-3(b)(1), unless the authorization is terminated or revoked.  Performed at [facility], [place] [address], [place], [place] [address]   Urinalysis, Routine w reflex microscopic Urine, Random     Status: Abnormal   Collection Time: 10/15/20 10:48 PM  Result Value Ref Range   Color, Urine YELLOW YELLOW   APPearance HAZY (A) CLEAR   Specific Gravity, Urine 1.005 1.005 - 1.030   pH 6.0 5.0 - 8.0   Glucose, UA NEGATIVE NEGATIVE mg/dL   Hgb urine dipstick NEGATIVE  NEGATIVE   Bilirubin Urine NEGATIVE NEGATIVE   Ketones, ur NEGATIVE NEGATIVE mg/dL   Protein, ur NEGATIVE NEGATIVE mg/dL   Nitrite NEGATIVE NEGATIVE   Leukocytes,Ua NEGATIVE NEGATIVE    Comment: Performed at [facility] [address] [place], [place] [address]  Basic metabolic panel     Status: Abnormal   Collection Time: 10/16/20  1:39 AM  Result Value Ref Range   Sodium 135 135 - 145 mmol/L   Potassium 3.4 (L) 3.5 - 5.1 mmol/L   Chloride 105 98 - 111 mmol/L   CO2 18 (L) 22 - 32 mmol/L   Glucose, Bld 44 (LL) 70 - 99 mg/dL    Comment: Glucose reference range applies only to samples taken after fasting for at least 8 hours. CRITICAL RESULT CALLED TO, READ BACK BY AND VERIFIED WITH:  DNevin Bloodgood RN @0325  10/16/20 K. SANDERS    BUN <5 (L) 8 - 23 mg/dL   Creatinine, Ser 0.68 0.44 - 1.00 mg/dL   Calcium 7.4 (L) 8.9 - 10.3 mg/dL   GFR, Estimated >60 >60 mL/min    Comment: (NOTE) Calculated using the CKD-EPI Creatinine Equation (2021)  Anion gap 12 5 - 15    Comment: Performed at [facility] [address], [place], [place] [address]  CBC     Status: Abnormal   Collection Time: 10/16/20  1:39 AM  Result Value Ref Range   WBC 4.0 4.0 - 10.5 K/uL   RBC 2.54 (L) 3.87 - 5.11 MIL/uL   Hemoglobin 9.1 (L) 12.0 - 15.0 g/dL   HCT 26.1 (L) 36.0 - 46.0 %   MCV 102.8 (H) 80.0 - 100.0 fL   MCH 35.8 (H) 26.0 - 34.0 pg   MCHC 34.9 30.0 - 36.0 g/dL   RDW 11.9 11.5 - 15.5 %   Platelets 217 150 - 400 K/uL   nRBC 0.0 0.0 - 0.2 %    Comment: Performed at [facility], [place] [address], [place], [place] [address]  Magnesium     Status: None   Collection Time: 10/16/20  1:39 AM  Result Value Ref Range   Magnesium 1.7 1.7 - 2.4 mg/dL    Comment: Performed at [facility] [address], [place], [place] [address]  Glucose, capillary     Status: Abnormal   Collection Time: 10/16/20  3:09 AM  Result Value Ref Range   Glucose-Capillary 46 (L) 70 - 99 mg/dL    Comment:  Glucose reference range applies only to samples taken after fasting for at least 8 hours.  Glucose, capillary     Status: Abnormal   Collection Time: 10/16/20  4:15 AM  Result Value Ref Range   Glucose-Capillary 172 (H) 70 - 99 mg/dL    Comment: Glucose reference range applies only to samples taken after fasting for at least 8 hours.  Lactic acid, plasma     Status: Abnormal   Collection Time: 10/16/20 11:27 AM  Result Value Ref Range   Lactic Acid, Venous 3.5 (HH) 0.5 - 1.9 mmol/L    Comment: CRITICAL VALUE NOTED.  VALUE IS CONSISTENT WITH PREVIOUSLY REPORTED AND CALLED VALUE. Performed at [facility], [place] [address] [place], [place] [address]   Glucose, capillary     Status: Abnormal   Collection Time: 10/16/20 11:55 AM  Result Value Ref Range   Glucose-Capillary 162 (H) 70 - 99 mg/dL    Comment: Glucose reference range applies only to samples taken after fasting for at least 8 hours.    DG Ribs Unilateral W/Chest Right  Result Date: 10/15/2020 CLINICAL DATA:  [place], pain EXAM: RIGHT RIBS AND CHEST - 3+ VIEW COMPARISON:  11/04/2017 FINDINGS: Frontal view of the chest as well as oblique views of the right thoracic cage are obtained. Please note that the frontal view of the chest is labeled incorrectly, as previous exams demonstrate the pacemaker generator to be within the left chest wall. Pacer/AICD is unchanged. Cardiac silhouette is stable. No airspace disease, effusion, or pneumothorax. There is a minimally displaced right posterolateral seventh rib fracture, best seen on the frontal image. No other acute bony abnormalities. IMPRESSION: 1. Minimally displaced right posterolateral seventh rib fracture. 2. Otherwise no acute intrathoracic process. Electronically Signed   By: Randa Ngo M.D.   On: 10/15/2020 21:31   CT Head Wo Contrast  Result Date: 10/15/2020 CLINICAL DATA:  Patient's niece reported that patient has not been eating for the past several days , drinking ETOH  daily , fell yesterday with right ribcage pain , fatigue and generalized weakness , hypotensive at triage . EXAM: CT HEAD WITHOUT CONTRAST CT CERVICAL SPINE WITHOUT CONTRAST  TECHNIQUE: Multidetector CT imaging of the head and cervical spine was performed following the standard protocol without intravenous contrast. Multiplanar CT image reconstructions of the cervical spine were also generated. COMPARISON:  None. FINDINGS: CT HEAD FINDINGS Brain: Cerebral ventricle sizes are concordant with the degree of cerebral volume loss. Patchy and confluent areas of decreased attenuation are noted throughout the deep and periventricular white matter of the cerebral hemispheres bilaterally, compatible with chronic microvascular ischemic disease. No evidence of large-territorial acute infarction. No parenchymal hemorrhage. No mass lesion. No extra-axial collection. No mass effect or midline shift. No hydrocephalus. Basilar cisterns are patent. Vascular: No hyperdense vessel. Atherosclerotic calcifications are present within the cavernous internal carotid arteries. Skull: No acute fracture or focal lesion. Sinuses/Orbits: Paranasal sinuses and mastoid air cells are clear. The orbits are unremarkable. Other: None. CT CERVICAL SPINE FINDINGS Alignment: Normal. Skull base and vertebrae: No acute fracture. No aggressive appearing focal osseous lesion or focal pathologic process. Soft tissues and spinal canal: No prevertebral fluid or swelling. No visible canal hematoma. Upper chest: Emphysematous changes. Other: Mild to moderate atherosclerotic plaque of the carotid arteries in the neck. IMPRESSION: 1. No acute intracranial abnormality. 2. No acute displaced fracture or traumatic listhesis of the cervical spine. 3.  Emphysema (ICD10-J43.9). Electronically Signed   By: Iven Finn M.D.   On: 10/15/2020 22:03   CT Cervical Spine Wo Contrast  Result Date: 10/15/2020 CLINICAL DATA:  Patient's niece reported that patient has not  been eating for the past several days , drinking ETOH daily , fell yesterday with right ribcage pain , fatigue and generalized weakness , hypotensive at triage . EXAM: CT HEAD WITHOUT CONTRAST CT CERVICAL SPINE WITHOUT CONTRAST TECHNIQUE: Multidetector CT imaging of the head and cervical spine was performed following the standard protocol without intravenous contrast. Multiplanar CT image reconstructions of the cervical spine were also generated. COMPARISON:  None. FINDINGS: CT HEAD FINDINGS Brain: Cerebral ventricle sizes are concordant with the degree of cerebral volume loss. Patchy and confluent areas of decreased attenuation are noted throughout the deep and periventricular white matter of the cerebral hemispheres bilaterally, compatible with chronic microvascular ischemic disease. No evidence of large-territorial acute infarction. No parenchymal hemorrhage. No mass lesion. No extra-axial collection. No mass effect or midline shift. No hydrocephalus. Basilar cisterns are patent. Vascular: No hyperdense vessel. Atherosclerotic calcifications are present within the cavernous internal carotid arteries. Skull: No acute fracture or focal lesion. Sinuses/Orbits: Paranasal sinuses and mastoid air cells are clear. The orbits are unremarkable. Other: None. CT CERVICAL SPINE FINDINGS Alignment: Normal. Skull base and vertebrae: No acute fracture. No aggressive appearing focal osseous lesion or focal pathologic process. Soft tissues and spinal canal: No prevertebral fluid or swelling. No visible canal hematoma. Upper chest: Emphysematous changes. Other: Mild to moderate atherosclerotic plaque of the carotid arteries in the neck. IMPRESSION: 1. No acute intracranial abnormality. 2. No acute displaced fracture or traumatic listhesis of the cervical spine. 3.  Emphysema (ICD10-J43.9). Electronically Signed   By: Iven Finn M.D.   On: 10/15/2020 22:03   CT CHEST ABDOMEN PELVIS W CONTRAST  Result Date:  10/16/2020 CLINICAL DATA:  Trauma. EXAM: CT CHEST, ABDOMEN, AND PELVIS WITH CONTRAST TECHNIQUE: Multidetector CT imaging of the chest, abdomen and pelvis was performed following the standard protocol during bolus administration of intravenous contrast. CONTRAST:  137mL OMNIPAQUE IOHEXOL 300 MG/ML  SOLN COMPARISON:  Chest x-ray 11/04/2017. FINDINGS: CHEST: Ports and Devices: Left chest wall 2 lead cardiac device. Lungs/airways: Bilateral lower lobe subsegmental atelectasis. Left  lower lobe scarring versus atelectasis. Left lower lobe 1.7 cm cavitary lung lesion. Cluster of pulmonary micronodules measuring up to 5 mm in area within the right middle lobe (4:89). No focal consolidation. No pulmonary mass. No pulmonary contusion or laceration. The central airways are patent. Pleura: No pleural effusion. No pneumothorax. No hemothorax. Lymph Nodes: There is a 1.6 cm left hilar lymph node (3:30, 5:73). No mediastinal or axillary lymphadenopathy. Mediastinum: No pneumomediastinum. No aortic injury or mediastinal hematoma. The thoracic aorta is normal in caliber. Atherosclerotic plaque of the thoracic aorta as well as four-vessel coronary artery calcifications status post CABG. The heart is normal in size. No significant pericardial effusion. The main pulmonary artery is normal in caliber. No central pulmonary embolus. The esophagus is unremarkable. Subcentimeter hypodense nodules within thyroid gland. Chest Wall / Breasts: No chest wall mass. Musculoskeletal: Subacute to chronic appearing nondisplaced right rib fractures. No acute displaced rib or sternal fracture. Sternotomy wires. Large Schmorl node of the T11 vertebral body with associated vertebral body height loss centrally. Fracture of the T12 vertebral body involving the anterior and posterior wall as well as superior endplate consistent with a burst fracture. 2 mm retropulsion into the central canal. No suspicious lytic or blastic osseous lesions. ABDOMEN / PELVIS:  Liver: The left hepatic lobe is enlarged with suggestion of of a nodular hepatic contour. The hepatic parenchyma is diffusely hypodense compared to the splenic parenchyma consistent with fatty infiltration. No focal liver abnormality. There is a 3.6 cm hypodense right hepatic lobe lesion (3:51). No gallstones, gallbladder wall thickening, or pericholecystic fluid. No biliary dilatation. No laceration or subcapsular hematoma. Biliary System: The gallbladder is otherwise unremarkable with no radio-opaque gallstones. No biliary ductal dilatation. Pancreas: Normal pancreatic contour. No main pancreatic duct dilatation. Spleen: Not enlarged. No focal lesion. No laceration, subcapsular hematoma, or vascular injury. Adrenal Glands: No nodularity bilaterally. Kidneys: Bilateral kidneys enhance symmetrically. Fluid density lesions within bilateral kidneys likely represent simple renal cysts. Subcentimeter hypodensities are too small to characterize. There is a 1.8 cm left renal cystic lesion with a thin septation likely representing a minimally complex renal cyst. No hydronephrosis. No contusion, laceration, or subcapsular hematoma. No injury to the vascular structures or collecting systems. No hydroureter. The urinary bladder is unremarkable. Bowel: No small or large bowel wall thickening or dilatation. Scattered colonic diverticulosis. The appendix not definitely identified. Mesentery, Omentum, and Peritoneum: No simple free fluid ascites. No pneumoperitoneum. No hemoperitoneum. No mesenteric hematoma identified. No organized fluid collection. Pelvic Organs: Normal. Lymph Nodes: No abdominal, pelvic, inguinal lymphadenopathy. Vasculature: Severe atherosclerotic plaque of the aorta and its branches. No abdominal aorta or iliac aneurysm. No active contrast extravasation or pseudoaneurysm. Musculoskeletal: No significant soft tissue hematoma. No suspicious lytic or blastic osseous lesion. No acute pelvic fracture. No spinal  fracture. Avascular necrosis of the right femoral head. IMPRESSION: 1. Left lower lobe 1.7 cm cavitary lung lesion. Associated left hilar lymphadenopathy. Finding could represent infection versus malignancy. Additional imaging evaluation or consultation with Pulmonology or Thoracic Surgery recommended 2. Question cirrhotic morphology with a 3.6 cm right hepatic lobe lesion. Recommend MRI liver protocol for further evaluation. 3. Age-indeterminate, possibly acute, burst fracture of the T12 vertebral body with 2 mm retropulsion into the central canal. Vertebral body height loss of the T11 vertebral body likely due to degenerative changes. Correlate with point tenderness to palpation for an acute component. Consider MRI lumbar spine for further evaluation. 4. Otherwise no acute traumatic injury to the chest, abdomen, or pelvis. 5. No  acute fracture or traumatic malalignment of the lumbar spine. 6. Other imaging findings of potential clinical significance: Cluster of pulmonary nodules within the right middle lobe with area measuring up to 5 mm. Scattered colonic diverticulosis with no acute diverticulitis. Minimally complex left renal cyst. Avascular necrosis of the right femoral head. Aortic Atherosclerosis (ICD10-I70.0). Electronically Signed   By: Iven Finn M.D.   On: 10/16/2020 00:02   CT T-SPINE NO CHARGE  Result Date: 10/16/2020 CLINICAL DATA:  Spinal fracture.  Recent falls. EXAM: CT THORACIC SPINE WITHOUT CONTRAST TECHNIQUE: Multidetector CT images of the thoracic were obtained using the standard protocol without intravenous contrast. CT chest images reconstructed from CT chest abdomen pelvis 10/15/2020 COMPARISON:  CT chest abdomen pelvis 10/15/2020. FINDINGS: Alignment: Normal alignment. Vertebrae: Compression fractures of T12 and L1 which appear acute. Thoracic numbering performed from the craniocervical junction using CT cervical spine 10/15/2020. 30% loss of height of T12 vertebral body due to  acute fracture. 40% loss of height of L1 vertebral body. Fractures are not causing significant stenosis. No fracture in the posterior elements. Paraspinal and other soft tissues: Negative for paraspinous mass or adenopathy. Mild bibasilar atelectasis. No pleural effusion. 16 mm left hilar lymph node as described on prior chest CT report Disc levels: Mild disc degeneration is seen throughout the thoracic spine with disc space narrowing and disc calcification. No significant spinal stenosis identified. Mild facet degeneration is present in the thoracic spine. IMPRESSION: Acute fractures superior endplates of C62 and L1 without significant spinal stenosis. Electronically Signed   By: Franchot Gallo M.D.   On: 10/16/2020 14:29   CT L-SPINE NO CHARGE  Result Date: 10/16/2020 CLINICAL DATA:  Recent fall.  Compression fracture. EXAM: CT LUMBAR SPINE WITHOUT CONTRAST TECHNIQUE: Multidetector CT imaging of the lumbar spine was performed without intravenous contrast administration. Multiplanar CT image reconstructions were also generated. COMPARISON:  None. FINDINGS: Segmentation: S1 is fully lumbarized when counting from the craniocervical junction. Alignment: Normal Vertebrae: Superior endplate fractures of B76 and L1 appear acute. L1 compression fracture approximately 40% loss of height and T12 fracture approximately 30% loss of height. No significant retropulsion of bone into the canal. No posterior element fractures. No mass lesion identified. Paraspinal and other soft tissues: Vascular contrast present. Lumbar spine images reformatted from CT chest abdomen pelvis with contrast. No paraspinous edema or mass identified. Atherosclerotic aorta. Disc levels: T12-L1: Disc degeneration and mild spurring. Negative for stenosis L1-2: Negative for stenosis L2-3: Mild disc bulging.  Negative for stenosis L3-4: Mild disc bulging and mild facet hypertrophy. Negative for stenosis L4-5: Moderate disc bulging. Mild to moderate  facet hypertrophy bilaterally. Mild spinal stenosis and mild subarticular stenosis bilaterally L5-S1: Disc bulging and facet degeneration. Mild subarticular stenosis bilaterally S1-2: Mild facet degeneration. Mild disc bulging. Negative for stenosis. IMPRESSION: Acute superior endplate fractures of E83 and L1. No significant spinal stenosis. No evidence of underlying mass lesion S1 is fully lumbarized based on counting from the craniocervical junction. Electronically Signed   By: Franchot Gallo M.D.   On: 10/16/2020 14:23    Review of Systems  Unable to perform ROS: Dementia  Constitutional: Positive for activity change.  Cardiovascular: Positive for chest pain.  Musculoskeletal: Positive for back pain.   Blood pressure (!) 98/44, pulse 100, temperature 97.7 F (36.5 C), temperature source Axillary, resp. rate 20, height 5\' 9"  (1.753 m), weight 55 kg, SpO2 100 %. Physical Exam Constitutional:      Appearance: Normal appearance. She is normal weight.  HENT:  Head: Normocephalic and atraumatic.     Right Ear: Tympanic membrane normal.     Left Ear: Tympanic membrane normal.     Nose: Nose normal.     Mouth/Throat:     Mouth: Mucous membranes are moist.  Eyes:     Extraocular Movements: Extraocular movements intact.     Pupils: Pupils are equal, round, and reactive to light.  Cardiovascular:     Rate and Rhythm: Normal rate and regular rhythm.     Heart sounds: Normal heart sounds.  Pulmonary:     Effort: Pulmonary effort is normal.     Breath sounds: Normal breath sounds.  Abdominal:     General: Bowel sounds are normal.     Palpations: Abdomen is soft.  Musculoskeletal:     Cervical back: Normal range of motion.     Comments: Modest tenderness to palpation and percussion throughout the thoracic and lumbar spines.  No severe acute pain noted.  Skin:    General: Skin is warm and dry.  Neurological:     Mental Status: She is alert.     Comments: Alert and oriented to hospital  but not the date.  She is conversant.  She notes that she has had some falls.  Her motor strength in the upper extremities is intact in the deltoids biceps triceps grips and intrinsics.  The lower extremity she has good strength in iliopsoas and quads are being graded 4 out of 5 tibialis anterior and gastroc strength is graded 5 out of 5 reflexes are absent in the patella and the Achilles.  Sensation appears intact to light touch in the lower extremities.  Cranial nerve examination appears intact also.  Psychiatric:        Mood and Affect: Mood normal.        Behavior: Behavior normal.     Assessment/Plan: T12 stable burst fracture.  Minimal involvement of the canal.  Neurologically intact.  Plan: Mobilization with the use of a TLSO.  Outpatient follow-up in approximately 4 weeks with repeat radiographs.  Blanchie Dessert Lee Kalt 10/16/2020, 2:39 PM

## 2020-10-16 NOTE — Progress Notes (Signed)
Patient NST 140's while being taken to bathroom by Staff, RN. HR has been 120's throughout the day. Patient c/o bilateral side pain. Oxy 5 mg given prn. Patient at this time HR 135.  Amion message sent to on cal MD. Awaiting call back. Patient resting quietly at this time. Doesn't appear to be in any distress at this time. No c/o  Sob. Will monitor.

## 2020-10-16 NOTE — Progress Notes (Signed)
Orthopedic Tech Progress Note Patient Details:  Dorothy Li 11-Jun-1946 [id number] Called order into [facility] Patient ID: [name], female   DOB: 12/05/1945, 75 y.o.   MRN: [id number]   Chip Boer 10/16/2020, 5:40 PM

## 2020-10-16 NOTE — Progress Notes (Signed)
Patient's ICD is MRI unsafe. Therefore, MRI exam cannot be performed as ordered. Sent message to ordering physician to make them aware. Awaiting response.

## 2020-10-16 NOTE — Progress Notes (Signed)
Echocardiogram completed.

## 2020-10-16 NOTE — TOC Initial Note (Signed)
Transition of Care [facility]) - Initial/Assessment Note    Patient Details  Name: Dorothy Li MRN: [id number] Date of Birth: 1945-11-11  Transition of Care [facility]) CM/SW Contact:    Bethann Berkshire, [place] Phone Number: 10/16/2020, 4:10 PM  Clinical Narrative:                  Received notification from RN that pt niece has questions about d/c plans. RN confirmed that pt consents to [place] calling niece. CSW called and provided update that he is waiting on further recommendations from MD and PT for d/c plans at this time. Niece explains that pt lives with her spouse in [place]. They have previously been independent but are beginning to need more assistance at home with PCS/cooking/cleaning. She states that she would like them to receive assistance at home. CSW explains limitations of medicare and that it does not pay for PCS. CSW explained that PCS could be paid out of pocket. Pt may have VA benefits to provide PCS. Niece explains that she lives in [place] and that pt also has other family in the area. CSW explained that TOC would continue to follow pt for d/c planning.   Expected Discharge Plan:  (SNF VS HH)? Barriers to Discharge: Continued Medical Work up   Patient Goals and CMS Choice        Expected Discharge Plan and Services Expected Discharge Plan:  (SNF VS [facility])?       Living arrangements for the past 2 months: [place]                                      Prior Living Arrangements/Services Living arrangements for the past 2 months: [place] Lives with:: Spouse          Need for Family Participation in Patient Care: Yes (Comment) Care giver support system in place?: Yes (comment)      Activities of Daily Living      Permission Sought/Granted                  Emotional Assessment         Alcohol / Substance Use: Not Applicable Psych Involvement: No (comment)  Admission diagnosis:  Dehydration [E86.0] Hypokalemia  [E87.6] Hypomagnesemia [E83.42] Pain [K74] Alcoholic intoxication without complication (HCC) [Q59.563] Closed fracture of one rib of right side, initial encounter [S22.31XA] Multiple falls [R29.6] Patient Active Problem List   Diagnosis Date Noted  . Multiple falls 10/16/2020  . Hypokalemia 10/15/2020  . Hypomagnesemia 10/15/2020  . EtOH dependence ([place]) 10/15/2020  . FTT (failure to thrive) in adult 10/15/2020  . Colon polyps 04/11/2012  . Malignant neoplasm of upper-outer quadrant of female breast ([place]) 12/15/2011  . Breast cancer, right breast ([place]) 12/08/2011  . Breast cancer ([place]) 12/08/2011  . ARF (acute renal failure) ([place]) 02/04/2011  . Hypotension 01/26/2011  . Pure hypercholesterolemia 07/30/2010  . 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  . CAROTID ARTERY STENOSIS 02/11/2010  . CORONARY ATHEROSLERO UNSPEC TYPE BYPASS GRAFT 11/11/2009  . CAROTID BRUIT 11/11/2009  . Nonischemic cardiomyopathy/ CAD-CABG 10/08/2009  . Chronic systolic heart failure ([place]) 10/08/2009  . VENTRICULAR TACHYCARDIA 09/16/2009  . Essential hypertension 09/05/2009  . SYNCOPE 09/05/2009   PCP:  Clinic, [place]:   [facility] [place] [address] Phone: [phone] Fax: [place] ([place]),  [place] - [address] [place] ([place]) [place] [address] Phone: [phone] Fax: [place] [address] [place] [address] Phone: [phone] Fax: [place] [address] [place] [address] Phone: [phone] Fax: [phone]     Social Determinants of Health (SDOH) Interventions    Readmission Risk Interventions No flowsheet data found.

## 2020-10-17 DIAGNOSIS — R296 Repeated falls: Secondary | ICD-10-CM

## 2020-10-17 LAB — CBC WITH DIFFERENTIAL/PLATELET
Abs Immature Granulocytes: 0.02 10*3/uL (ref 0.00–0.07)
Basophils Absolute: 0.1 10*3/uL (ref 0.0–0.1)
Basophils Relative: 2 %
Eosinophils Absolute: 0.1 10*3/uL (ref 0.0–0.5)
Eosinophils Relative: 1 %
HCT: 28.1 % — ABNORMAL LOW (ref 36.0–46.0)
Hemoglobin: 9.7 g/dL — ABNORMAL LOW (ref 12.0–15.0)
Immature Granulocytes: 0 %
Lymphocytes Relative: 21 %
Lymphs Abs: 1.2 10*3/uL (ref 0.7–4.0)
MCH: 36.1 pg — ABNORMAL HIGH (ref 26.0–34.0)
MCHC: 34.5 g/dL (ref 30.0–36.0)
MCV: 104.5 fL — ABNORMAL HIGH (ref 80.0–100.0)
Monocytes Absolute: 0.8 10*3/uL (ref 0.1–1.0)
Monocytes Relative: 14 %
Neutro Abs: 3.5 10*3/uL (ref 1.7–7.7)
Neutrophils Relative %: 62 %
Platelets: 242 10*3/uL (ref 150–400)
RBC: 2.69 MIL/uL — ABNORMAL LOW (ref 3.87–5.11)
RDW: 12.6 % (ref 11.5–15.5)
WBC: 5.8 10*3/uL (ref 4.0–10.5)
nRBC: 0 % (ref 0.0–0.2)

## 2020-10-17 LAB — COMPREHENSIVE METABOLIC PANEL
ALT: 35 U/L (ref 0–44)
AST: 45 U/L — ABNORMAL HIGH (ref 15–41)
Albumin: 2.2 g/dL — ABNORMAL LOW (ref 3.5–5.0)
Alkaline Phosphatase: 55 U/L (ref 38–126)
Anion gap: 4 — ABNORMAL LOW (ref 5–15)
BUN: <5 mg/dL — ABNORMAL LOW (ref 8–23)
CO2: 21 mmol/L — ABNORMAL LOW (ref 22–32)
Calcium: 7.4 mg/dL — ABNORMAL LOW (ref 8.9–10.3)
Chloride: 108 mmol/L (ref 98–111)
Creatinine, Ser: 0.74 mg/dL (ref 0.44–1.00)
GFR, Estimated: >60 mL/min (ref >60)
Glucose, Bld: 176 mg/dL — ABNORMAL HIGH (ref 70–99)
Potassium: 4.7 mmol/L (ref 3.5–5.1)
Sodium: 133 mmol/L — ABNORMAL LOW (ref 135–145)
Total Bilirubin: 0.7 mg/dL (ref 0.3–1.2)
Total Protein: 5.3 g/dL — ABNORMAL LOW (ref 6.5–8.1)

## 2020-10-17 LAB — MAGNESIUM: Magnesium: 1.3 mg/dL — ABNORMAL LOW (ref 1.7–2.4)

## 2020-10-17 LAB — LACTIC ACID, PLASMA
Lactic Acid, Venous: 3.8 mmol/L (ref 0.5–1.9)
Lactic Acid, Venous: 2.7 mmol/L (ref 0.5–1.9)

## 2020-10-17 LAB — GLUCOSE, CAPILLARY
Glucose-Capillary: 145 mg/dL — ABNORMAL HIGH (ref 70–99)
Glucose-Capillary: 119 mg/dL — ABNORMAL HIGH (ref 70–99)
Glucose-Capillary: 155 mg/dL — ABNORMAL HIGH (ref 70–99)
Glucose-Capillary: 124 mg/dL — ABNORMAL HIGH (ref 70–99)
Glucose-Capillary: 142 mg/dL — ABNORMAL HIGH (ref 70–99)

## 2020-10-17 MED ORDER — CARVEDILOL 25 MG PO TABS: 25.0000 mg | ORAL_TABLET | Freq: Two times a day (BID) | ORAL | Status: DC

## 2020-10-17 MED ORDER — CARVEDILOL 12.5 MG PO TABS
12.5000 mg | ORAL_TABLET | Freq: Two times a day (BID) | ORAL | Status: DC
  Administered 2020-10-17 – 2020-10-19 (×4): 12.5 mg via ORAL
  Filled 2020-10-17 (×4): qty 1

## 2020-10-17 MED ORDER — MAGNESIUM SULFATE 4 GM/100ML IV SOLN
4.0000 g | Freq: Once | INTRAVENOUS | Status: AC
  Administered 2020-10-17: 4 g via INTRAVENOUS
  Filled 2020-10-17: qty 100

## 2020-10-17 NOTE — Evaluation (Addendum)
Physical Therapy Evaluation Patient Details Name: Dorothy Li MRN: [id number] DOB: 13-Mar-1946 Today's Date: 10/17/2020   History of Present Illness  Pt is a 75 y.o. female who presented to the ED following a fall at home. CT revealed T12 and L1 fxs. PMH consists of multivessel coronary artery disease, status post CABG, chronic congestive heart failure with reduced ejection fraction (EF 25%), history of VT, status post implanted cardioverter-defibrillator, history of breast cancer, hypertension, hyperlipidemia, and alcohol dependence.    Clinical Impression  Pt admitted with above diagnosis. PTA pt mod I mobility/ADLs with SPC. Per RN, pt is cleared for mobility now that TLSO has been delivered. On eval, she required mod assist bed mobility, min assist transfers, and min assist ambulation 100' with RW. Pt educated on 3/3 back precautions. She required total assist with donning of TLSO.  Pt currently with functional limitations due to the deficits listed below (see PT Problem List). Pt will benefit from skilled PT to increase their independence and safety with mobility to allow discharge to the venue listed below.  Pt/family will need further education for TLSO management at home. Pt's niece present in room and confirms family is able to provide 24-hour assist at home. Pt has all needed DME.     Follow Up Recommendations Home health PT;Supervision/Assistance - 24 hour    Equipment Recommendations  None recommended by PT    Recommendations for Other Services OT consult     Precautions / Restrictions Precautions Precautions: Fall;Back Precaution Comments: Educated on 3/3 back precautions. Handout provided. Required Braces or Orthoses: Spinal Brace Spinal Brace: Thoracolumbosacral orthotic;Applied in sitting position      Mobility  Bed Mobility Overal bed mobility: Needs Assistance Bed Mobility: Rolling;Sidelying to Sit Rolling: Min assist Sidelying to sit: HOB elevated;Mod assist        General bed mobility comments: +rail, cues for sequencing/logroll, assist to elevate trunk    Transfers Overall transfer level: Needs assistance Equipment used: Rolling walker (2 wheeled) Transfers: Sit to/from [facility] Sit to Stand: Min assist Stand pivot transfers: Min assist       General transfer comment: cues for hand placement, assist to power up and stabilize balance  Ambulation/Gait Ambulation/Gait assistance: Min assist Gait Distance (Feet): 100 Feet Assistive device: Rolling walker (2 wheeled) Gait Pattern/deviations: Step-through pattern;Decreased stride length Gait velocity: decreased Gait velocity interpretation: <1.31 ft/sec, indicative of household ambulator General Gait Details: assist to stabilize balance  Stairs            Wheelchair Mobility    Modified Rankin (Stroke Patients Only)       Balance Overall balance assessment: Needs assistance Sitting-balance support: No upper extremity supported;Feet supported Sitting balance-Leahy Scale: Good     Standing balance support: Bilateral upper extremity supported;During functional activity Standing balance-Leahy Scale: Poor Standing balance comment: reliant on BUE support                             Pertinent Vitals/Pain Pain Assessment: 0-10 Pain Score: 8  Pain Descriptors / Indicators: Discomfort;Grimacing;Guarding Pain Intervention(s): Monitored during session;Repositioned;RN gave pain meds during session;Limited activity within patient's tolerance    Home Living Family/patient expects to be discharged to:: Private residence Living Arrangements: Spouse/significant other Available Help at Discharge: Family;Available 24 hours/day Type of Home: House Home Access: Stairs to enter Entrance Stairs-Rails: Psychiatric nurse of Steps: 3 Home Layout: Laundry or work area in [place]: Environmental consultant - 2 wheels;Walker -  4 wheels;Cane - single  point;Shower seat      Prior Function Level of Independence: Independent with assistive device(s)         Comments: cane for amb, drives, grocery shops     Hand Dominance        Extremity/Trunk Assessment   Upper Extremity Assessment Upper Extremity Assessment: Defer to OT evaluation    Lower Extremity Assessment Lower Extremity Assessment: Generalized weakness    Cervical / Trunk Assessment Cervical / Trunk Assessment: Other exceptions Cervical / Trunk Exceptions: TLSO  Communication   Communication: No difficulties  Cognition Arousal/Alertness: Awake/alert Behavior During Therapy: WFL for tasks assessed/performed Overall Cognitive Status: Impaired/Different from baseline Area of Impairment: Safety/judgement;Problem solving                         Safety/Judgement: Decreased awareness of safety;Decreased awareness of deficits   Problem Solving: Difficulty sequencing;Requires verbal cues General Comments: Pt soiled with urine and stool on arrival, but pt unaware.      General Comments General comments (skin integrity, edema, etc.): VSS on RA    Exercises     Assessment/Plan    PT Assessment Patient needs continued PT services  PT Problem List Decreased strength;Decreased mobility;Decreased safety awareness;Decreased activity tolerance;Decreased cognition;Decreased balance;Pain       PT Treatment Interventions DME instruction;Therapeutic activities;Gait training;Therapeutic exercise;Patient/family education;Balance training;Stair training;Functional mobility training    PT Goals (Current goals can be found in the Care Plan section)  Acute Rehab PT Goals Patient Stated Goal: home PT Goal Formulation: With patient/family Time For Goal Achievement: 10/31/20 Potential to Achieve Goals: Good    Frequency Min 5X/week   Barriers to discharge        Co-evaluation               AM-PAC PT "6 Clicks" Mobility  Outcome Measure Help needed  turning from your back to your side while in a flat bed without using bedrails?: A Little Help needed moving from lying on your back to sitting on the side of a flat bed without using bedrails?: A Lot Help needed moving to and from a bed to a chair (including a wheelchair)?: A Little Help needed standing up from a chair using your arms (e.g., wheelchair or bedside chair)?: A Little Help needed to walk in hospital room?: A Little Help needed climbing 3-5 steps with a railing? : A Lot 6 Click Score: 16    End of Session Equipment Utilized During Treatment: Gait belt;Back brace Activity Tolerance: Patient tolerated treatment well Patient left: in chair;with call bell/phone within reach;with chair alarm set;with nursing/sitter in room;with family/visitor present Nurse Communication: Mobility status PT Visit Diagnosis: Difficulty in walking, not elsewhere classified (R26.2);Pain;Muscle weakness (generalized) (M62.81)    Time: 3235-5732 PT Time Calculation (min) (ACUTE ONLY): 34 min   Charges:   PT Evaluation $PT Eval Moderate Complexity: 1 Mod PT Treatments $Gait Training: 8-22 mins        Lorrin Goodell, PT  Office # [phone] Pager [phone]   Dorothy Li 10/17/2020, 1:35 PM

## 2020-10-17 NOTE — Progress Notes (Deleted)
Patient has acute on chronic respiratory failure and continues to exhibit signs of hypercapnia associated with morbid obesity that is causing thoracic restriction.  Interruption or failure to provide NIV would quickly lead to exacerbation of the patient's condition, hospital admission, and likely harm to the patient. Continued use is preferred.  The use of the NIV will treat patient's high PC02 levels and can reduce risk of exacerbations and future hospitalizations when used at night and during the day.  BiLevel/RAD has been considered and ruled out as patient requires continuous alarms, backup battery, and portability which are not possible with BiLevel/RAD devices.  Ventilation is required to decrease the work of breathing and improve pulmonary status. Interruption of ventilator support would lead to decline of health status.  Patient is able to protect their airways and clear secretions on their own. Lurline Idol, MSW, LCSW 4/15/20222:31 PM

## 2020-10-17 NOTE — Progress Notes (Signed)
PROGRESS NOTE    Dorothy Li  OEU:[id number] DOB: 03-10-1946 DOA: 10/15/2020 PCP: [facility]   Brief Narrative:  Dorothy Li is a 75 y.o. female with medical history significant of multivessel coronary artery disease, status post CABG, chronic systolic CHF (EF 15%), history of VT, status post implanted cardioverter-defibrillator, history of breast cancer, hypertension, hyperlipidemia, alcohol dependence, was brought to hospital tonight for evaluation of general weakness, multiple falls.  According to patient's niece who is her POA, patient was hospitalized at [facility], for similar condition, where she was found to have severe dehydration with electrolyte abnormalities, in the setting of chronic alcohol dependence and poor oral intake.  She was found to have as well H. pylori infection and placed on quadruple therapy, with PPI, clarithromycin, amoxicillin and metronidazole.  Reportedly she was also found to have lung abscess, and was instructed to continue with Augmentin, upon completion of H. pylori treatment. While at home, patient continued to use alcohol. She developed again a generalized weakness and fatigue.  She had mechanical fall day before presenting to ED when she fell down and hurt her right side of chest, with subsequent ongoing pain in that area.  She continued to have poor oral intake.  Her niece decided to bring her again to hospital for evaluation.  In the ED patient was markedly hypotensive on arrival, with blood pressure 77/48, which improved after administration of fluids, up to 102/63.  Patient was afebrile, temperature 98.2 F.  No hypoxia, 100% room air. Chemistry revealed normal renal function, with mild hyponatremia 131, hypokalemia 2.6 and hypomagnesemia 1.3. LFTs were mildly elevated with normal bilirubin and AST of 63.  Lactic acid was elevated 2.7.    UA negative for infection. Alcohol levels of 318. CT head and cervical spine showed no acute abnormality  specifically no fractures or intracranial pathology. CT chest abdomen pelvis with contrast showed left lower lobe lung 1.7 cm cavitary lesion.  Associated left hilar lymphadenopathy.  Liver with cirrhotic morphology.  Burst fracture of T12 vertebral body. Patient received replacement of potassium magnesium and IV fluids and was admitted to hospital for further evaluation and treatment for multiple medical problems.  Assessment & Plan:   Active Problems:   Essential hypertension   Nonischemic cardiomyopathy/ CAD-CABG   Chronic systolic heart failure (HCC)   Implantable cardioverter-defibrillator (ICD) in situ   Pure hypercholesterolemia   Breast cancer, right breast (HCC)   Hypokalemia   Hypomagnesemia   EtOH dependence (HCC)   FTT (failure to thrive) in adult   Multiple falls   Hypotension in the setting of dehydration: Blood pressure much better.  Patient has fair p.o. intake now.  Will discontinue IV fluids.  Monitor closely.  Hypokalemia: Resolved.  Hypomagnesemia: 1.3.  Will replace.  Hypoglycemia: Presented with blood sugar 44.  Likely due to poor p.o. intake.  Now her p.o. intake is better and blood sugar is better.  We will stop IV fluids.  Lactic acidosis: Suspect due to poor oral intake and dehydration.   Had worsened lactic acidosis yesterday however she does not seem to be dehydrated clinically or any sign of infection.  We will recheck her lactic acid again.  Hepatic lobe lesion: CT abdomen pelvis shows 3.6 cm right hepatic lobe lesion.  MRI is recommended however patient has ICD which is MRI incompatible so unable to obtain MRI.  T12 vertebral body burst fracture: Seen by neurosurgery.  They recommended TLSO brace and then up and moving with PT.  She  has TLSO brace.  PT on board.  Chronic congestive heart failure with reduced ejection fraction (EF 25%) status post AICD/Multivessel coronary artery disease, status post CABG/Sinus tachycardia: Patient follows with [facility]. Upon review of her medication list, seems to be noncompliant with her cardiac medications. Regimen includes carvedilol, enalapril, hydralazine, spironolactone, which are on hold given hypotension.  Patient has been having intermittent tachycardia since yesterday, especially with getting up.  This is likely due to pain as well as not being on beta-blockers.  I will resume her carvedilol but continue to hold enalapril and hydralazine.  Echo once again showed ejection fraction of 30 to 35% with grade 1 diastolic dysfunction.  Failure to thrive in adult/ Multiple falls with concern for home safety/ Fracture of the seventh rib on the right: Patient lives with her husband and continue to be alcohol dependence, with poor oral intake, multiple falls. Question safety with further risk of deterioration.  She would be an ideal candidate for SNF however patient's niece wants the patient to be discharged home with home health.  She still needs to be seen by PT.  History of breast cancer : continue anastrozole  Essential hypertension: Blood pressure now within normal range.  Resuming carvedilol but holding rest of the antihypertensives.  Hyperlipidemia: Continue atorvastatin  Chronic alcohol dependence/alcohol intoxication Patient continues to use alcohol daily, stated that she drinks several beers per day.Alcohol level 318 upon admission.  No signs of withdrawal at this point in time but continue CIWA protocol with as needed Ativan.  H. pylori infection: Patient was diagnosed with H. pylori infection during recent admission to [facility].  She was started on quadruple therapy with clarithromycin, amoxicillin, drometrizole and PPI which we will continue. She completed perhaps 2 or 3 days of therapy, with plans 2 weeks of treatment.  ?  Lung abscess Patient was told that she has spot on the lung, which requires nearly 6 weeks of antibiotics. Presumably Augmentin. CT chest abdomen pelvis is  does show left lower lobe 1.7 cm cavitary lung lesion with associated left hilar lymphadenopathy, consistent with possible abscess.  She is already on amoxicillin for H. pylori infection.  I have requested records from [place] and have not received any since yesterday.  Diarrhea: Patient had type VII stool yesterday.  Have ordered C. difficile per protocol.  DVT prophylaxis: enoxaparin (LOVENOX) injection 40 mg Start: 10/16/20 1000   Code Status: Full Code  Family Communication: None present at bedside.  Plan of care discussed with patient in length and he verbalized understanding and agreed with it.  I also discussed with patient's niece Dorothy Li over the phone and another niece named Dorothy Li who was present at the bedside.  Status is: Inpatient  Remains inpatient appropriate because:Inpatient level of care appropriate due to severity of illness   Dispo: The patient is from: Home              Anticipated d/c is to: Home              Patient currently is not medically stable to d/c.   Difficult to place patient No    Estimated body mass index is 17.91 kg/m as calculated from the following:   Height as of this encounter: 5\' 9"  (1.753 m).   Weight as of this encounter: 55 kg.      Nutritional status:               Consultants:   Neurosurgery  Procedures:   none  Antimicrobials:  Anti-infectives (From admission, onward)   Start     Dose/Rate Route Frequency Ordered Stop   10/16/20 1000  clarithromycin (BIAXIN) tablet 500 mg  Status:  Discontinued        500 mg Oral Every 12 hours 10/15/20 2337 10/15/20 2348   10/16/20 1000  metroNIDAZOLE (FLAGYL) tablet 500 mg  Status:  Discontinued        500 mg Oral Every 12 hours 10/15/20 2337 10/15/20 2348   10/16/20 0000  amoxicillin (AMOXIL) capsule 1,000 mg        1,000 mg Oral Every 12 hours 10/15/20 2337     10/16/20 0000  clarithromycin (BIAXIN) tablet 500 mg        500 mg Oral Every 12 hours 10/15/20 2348     10/16/20 0000   metroNIDAZOLE (FLAGYL) tablet 500 mg        500 mg Oral Every 12 hours 10/15/20 2348           Subjective: Patient seen and examined this morning.  She has no complaints.  She was fully alert and oriented.  No signs of alcohol withdrawal.  Objective: Vitals:   10/17/20 0201 10/17/20 0358 10/17/20 0400 10/17/20 0800  BP: (!) 99/55 110/69 116/69 123/68  Pulse: (!) 107 (!) 104 (!) 105 (!) 107  Resp: 18 17 (!) 21 18  Temp: 98.5 F (36.9 C) 98.4 F (36.9 C)    TempSrc: Oral Oral    SpO2: 96% 98% 97% 97%  Weight:      Height:        Intake/Output Summary (Last 24 hours) at 10/17/2020 1230 Last data filed at 10/17/2020 0800 Gross per 24 hour  Intake 1976.61 ml  Output 600 ml  Net 1376.61 ml   Filed Weights   10/15/20 2014  Weight: 55 kg    Examination:  General exam: Appears calm and comfortable  Respiratory system: Clear to auscultation. Respiratory effort normal. Cardiovascular system: S1 & S2 heard, RRR. No JVD, murmurs, rubs, gallops or clicks. No pedal edema. Gastrointestinal system: Abdomen is nondistended, soft and nontender. No organomegaly or masses felt. Normal bowel sounds heard. Central nervous system: Alert and oriented. No focal neurological deficits. Extremities: Symmetric 5 x 5 power. Skin: No rashes, lesions or ulcers.  Psychiatry: Judgement and insight appear poor.  Data Reviewed: I have personally reviewed following labs and imaging studies  CBC: Recent Labs  Lab 10/15/20 2015 10/16/20 0139 10/17/20 0122  WBC 4.1 4.0 5.8  NEUTROABS 2.1  --  3.5  HGB 9.8* 9.1* 9.7*  HCT 28.8* 26.1* 28.1*  MCV 104.0* 102.8* 104.5*  PLT 236 217 026   Basic Metabolic Panel: Recent Labs  Lab 10/15/20 2015 10/15/20 2033 10/16/20 0139 10/17/20 0122  NA 131*  --  135 133*  K 2.6*  --  3.4* 4.7  CL 102  --  105 108  CO2 17*  --  18* 21*  GLUCOSE 72  --  44* 176*  BUN <5*  --  <5* <5*  CREATININE 0.73  --  0.68 0.74  CALCIUM 7.5*  --  7.4* 7.4*  MG  --   1.3* 1.7 1.3*   GFR: Estimated Creatinine Clearance: 53.6 mL/min (by C-G formula based on SCr of 0.74 mg/dL). Liver Function Tests: Recent Labs  Lab 10/15/20 2015 10/17/20 0122  AST 63* 45*  ALT 41 35  ALKPHOS 62 55  BILITOT 0.7 0.7  PROT 5.8* 5.3*  ALBUMIN 2.7* 2.2*   No results for input(s):  LIPASE, AMYLASE in the last 168 hours. No results for input(s): AMMONIA in the last 168 hours. Coagulation Profile: No results for input(s): INR, PROTIME in the last 168 hours. Cardiac Enzymes: No results for input(s): CKTOTAL, CKMB, CKMBINDEX, TROPONINI in the last 168 hours. BNP (last 3 results) No results for input(s): PROBNP in the last 8760 hours. HbA1C: No results for input(s): HGBA1C in the last 72 hours. CBG: Recent Labs  Lab 10/16/20 1921 10/16/20 2317 10/17/20 0359 10/17/20 0733 10/17/20 1157  GLUCAP 152* 138* 145* 119* 155*   Lipid Profile: No results for input(s): CHOL, HDL, LDLCALC, TRIG, CHOLHDL, LDLDIRECT in the last 72 hours. Thyroid Function Tests: No results for input(s): TSH, T4TOTAL, FREET4, T3FREE, THYROIDAB in the last 72 hours. Anemia Panel: No results for input(s): VITAMINB12, FOLATE, FERRITIN, TIBC, IRON, RETICCTPCT in the last 72 hours. Sepsis Labs: Recent Labs  Lab 10/15/20 2033 10/15/20 2233 10/16/20 1127 10/16/20 1417  LATICACIDVEN 2.8* 2.7* 3.5* 4.3*    Recent Results (from the past 240 hour(s))  Resp Panel by RT-PCR (Flu A&B, Covid) Nasopharyngeal Swab     Status: None   Collection Time: 10/15/20 10:40 PM   Specimen: Nasopharyngeal Swab; Nasopharyngeal(NP) swabs in vial transport medium  Result Value Ref Range Status   SARS Coronavirus 2 by RT PCR NEGATIVE NEGATIVE Final    Comment: (NOTE) SARS-CoV-2 target nucleic acids are NOT DETECTED.  The SARS-CoV-2 RNA is generally detectable in upper respiratory specimens during the acute phase of infection. The lowest concentration of SARS-CoV-2 viral copies this assay can detect is 138  copies/mL. A negative result does not preclude SARS-Cov-2 infection and should not be used as the sole basis for treatment or other patient management decisions. A negative result may occur with  improper specimen collection/handling, submission of specimen other than nasopharyngeal swab, presence of viral mutation(s) within the areas targeted by this assay, and inadequate number of viral copies(<138 copies/mL). A negative result must be combined with clinical observations, patient history, and epidemiological information. The expected result is Negative.  Fact Sheet for Patients:  EntrepreneurPulse.com.au  Fact Sheet for Healthcare Providers:  IncredibleEmployment.be  This test is no t yet approved or cleared by the [place] FDA and  has been authorized for detection and/or diagnosis of SARS-CoV-2 by FDA under an Emergency Use Authorization (EUA). This EUA will remain  in effect (meaning this test can be used) for the duration of the COVID-19 declaration under Section 564(b)(1) of the Act, 21 U.S.C.section 360bbb-3(b)(1), unless the authorization is terminated  or revoked sooner.       Influenza A by PCR NEGATIVE NEGATIVE Final   Influenza B by PCR NEGATIVE NEGATIVE Final    Comment: (NOTE) The Xpert Xpress SARS-CoV-2/FLU/RSV plus assay is intended as an aid in the diagnosis of influenza from Nasopharyngeal swab specimens and should not be used as a sole basis for treatment. Nasal washings and aspirates are unacceptable for Xpert Xpress SARS-CoV-2/FLU/RSV testing.  Fact Sheet for Patients: EntrepreneurPulse.com.au  Fact Sheet for Healthcare Providers: IncredibleEmployment.be  This test is not yet approved or cleared by the [place] FDA and has been authorized for detection and/or diagnosis of SARS-CoV-2 by FDA under an Emergency Use Authorization (EUA). This EUA will remain in effect (meaning  this test can be used) for the duration of the COVID-19 declaration under Section 564(b)(1) of the Act, 21 U.S.C. section 360bbb-3(b)(1), unless the authorization is terminated or revoked.  Performed at [facility], [place] [address], [place], [place] [address]  Radiology Studies: DG Ribs Unilateral W/Chest Right  Result Date: 10/15/2020 CLINICAL DATA:  [place], pain EXAM: RIGHT RIBS AND CHEST - 3+ VIEW COMPARISON:  11/04/2017 FINDINGS: Frontal view of the chest as well as oblique views of the right thoracic cage are obtained. Please note that the frontal view of the chest is labeled incorrectly, as previous exams demonstrate the pacemaker generator to be within the left chest wall. Pacer/AICD is unchanged. Cardiac silhouette is stable. No airspace disease, effusion, or pneumothorax. There is a minimally displaced right posterolateral seventh rib fracture, best seen on the frontal image. No other acute bony abnormalities. IMPRESSION: 1. Minimally displaced right posterolateral seventh rib fracture. 2. Otherwise no acute intrathoracic process. Electronically Signed   By: Randa Ngo M.D.   On: 10/15/2020 21:31   CT Head Wo Contrast  Result Date: 10/15/2020 CLINICAL DATA:  Patient's niece reported that patient has not been eating for the past several days , drinking ETOH daily , fell yesterday with right ribcage pain , fatigue and generalized weakness , hypotensive at triage . EXAM: CT HEAD WITHOUT CONTRAST CT CERVICAL SPINE WITHOUT CONTRAST TECHNIQUE: Multidetector CT imaging of the head and cervical spine was performed following the standard protocol without intravenous contrast. Multiplanar CT image reconstructions of the cervical spine were also generated. COMPARISON:  None. FINDINGS: CT HEAD FINDINGS Brain: Cerebral ventricle sizes are concordant with the degree of cerebral volume loss. Patchy and confluent areas of decreased attenuation are noted throughout the deep and periventricular  white matter of the cerebral hemispheres bilaterally, compatible with chronic microvascular ischemic disease. No evidence of large-territorial acute infarction. No parenchymal hemorrhage. No mass lesion. No extra-axial collection. No mass effect or midline shift. No hydrocephalus. Basilar cisterns are patent. Vascular: No hyperdense vessel. Atherosclerotic calcifications are present within the cavernous internal carotid arteries. Skull: No acute fracture or focal lesion. Sinuses/Orbits: Paranasal sinuses and mastoid air cells are clear. The orbits are unremarkable. Other: None. CT CERVICAL SPINE FINDINGS Alignment: Normal. Skull base and vertebrae: No acute fracture. No aggressive appearing focal osseous lesion or focal pathologic process. Soft tissues and spinal canal: No prevertebral fluid or swelling. No visible canal hematoma. Upper chest: Emphysematous changes. Other: Mild to moderate atherosclerotic plaque of the carotid arteries in the neck. IMPRESSION: 1. No acute intracranial abnormality. 2. No acute displaced fracture or traumatic listhesis of the cervical spine. 3.  Emphysema (ICD10-J43.9). Electronically Signed   By: Iven Finn M.D.   On: 10/15/2020 22:03   CT Cervical Spine Wo Contrast  Result Date: 10/15/2020 CLINICAL DATA:  Patient's niece reported that patient has not been eating for the past several days , drinking ETOH daily , fell yesterday with right ribcage pain , fatigue and generalized weakness , hypotensive at triage . EXAM: CT HEAD WITHOUT CONTRAST CT CERVICAL SPINE WITHOUT CONTRAST TECHNIQUE: Multidetector CT imaging of the head and cervical spine was performed following the standard protocol without intravenous contrast. Multiplanar CT image reconstructions of the cervical spine were also generated. COMPARISON:  None. FINDINGS: CT HEAD FINDINGS Brain: Cerebral ventricle sizes are concordant with the degree of cerebral volume loss. Patchy and confluent areas of decreased  attenuation are noted throughout the deep and periventricular white matter of the cerebral hemispheres bilaterally, compatible with chronic microvascular ischemic disease. No evidence of large-territorial acute infarction. No parenchymal hemorrhage. No mass lesion. No extra-axial collection. No mass effect or midline shift. No hydrocephalus. Basilar cisterns are patent. Vascular: No hyperdense vessel. Atherosclerotic calcifications are present within the cavernous internal carotid  arteries. Skull: No acute fracture or focal lesion. Sinuses/Orbits: Paranasal sinuses and mastoid air cells are clear. The orbits are unremarkable. Other: None. CT CERVICAL SPINE FINDINGS Alignment: Normal. Skull base and vertebrae: No acute fracture. No aggressive appearing focal osseous lesion or focal pathologic process. Soft tissues and spinal canal: No prevertebral fluid or swelling. No visible canal hematoma. Upper chest: Emphysematous changes. Other: Mild to moderate atherosclerotic plaque of the carotid arteries in the neck. IMPRESSION: 1. No acute intracranial abnormality. 2. No acute displaced fracture or traumatic listhesis of the cervical spine. 3.  Emphysema (ICD10-J43.9). Electronically Signed   By: Iven Finn M.D.   On: 10/15/2020 22:03   CT CHEST ABDOMEN PELVIS W CONTRAST  Result Date: 10/16/2020 CLINICAL DATA:  Trauma. EXAM: CT CHEST, ABDOMEN, AND PELVIS WITH CONTRAST TECHNIQUE: Multidetector CT imaging of the chest, abdomen and pelvis was performed following the standard protocol during bolus administration of intravenous contrast. CONTRAST:  135mL OMNIPAQUE IOHEXOL 300 MG/ML  SOLN COMPARISON:  Chest x-ray 11/04/2017. FINDINGS: CHEST: Ports and Devices: Left chest wall 2 lead cardiac device. Lungs/airways: Bilateral lower lobe subsegmental atelectasis. Left lower lobe scarring versus atelectasis. Left lower lobe 1.7 cm cavitary lung lesion. Cluster of pulmonary micronodules measuring up to 5 mm in area within the  right middle lobe (4:89). No focal consolidation. No pulmonary mass. No pulmonary contusion or laceration. The central airways are patent. Pleura: No pleural effusion. No pneumothorax. No hemothorax. Lymph Nodes: There is a 1.6 cm left hilar lymph node (3:30, 5:73). No mediastinal or axillary lymphadenopathy. Mediastinum: No pneumomediastinum. No aortic injury or mediastinal hematoma. The thoracic aorta is normal in caliber. Atherosclerotic plaque of the thoracic aorta as well as four-vessel coronary artery calcifications status post CABG. The heart is normal in size. No significant pericardial effusion. The main pulmonary artery is normal in caliber. No central pulmonary embolus. The esophagus is unremarkable. Subcentimeter hypodense nodules within thyroid gland. Chest Wall / Breasts: No chest wall mass. Musculoskeletal: Subacute to chronic appearing nondisplaced right rib fractures. No acute displaced rib or sternal fracture. Sternotomy wires. Large Schmorl node of the T11 vertebral body with associated vertebral body height loss centrally. Fracture of the T12 vertebral body involving the anterior and posterior wall as well as superior endplate consistent with a burst fracture. 2 mm retropulsion into the central canal. No suspicious lytic or blastic osseous lesions. ABDOMEN / PELVIS: Liver: The left hepatic lobe is enlarged with suggestion of of a nodular hepatic contour. The hepatic parenchyma is diffusely hypodense compared to the splenic parenchyma consistent with fatty infiltration. No focal liver abnormality. There is a 3.6 cm hypodense right hepatic lobe lesion (3:51). No gallstones, gallbladder wall thickening, or pericholecystic fluid. No biliary dilatation. No laceration or subcapsular hematoma. Biliary System: The gallbladder is otherwise unremarkable with no radio-opaque gallstones. No biliary ductal dilatation. Pancreas: Normal pancreatic contour. No main pancreatic duct dilatation. Spleen: Not  enlarged. No focal lesion. No laceration, subcapsular hematoma, or vascular injury. Adrenal Glands: No nodularity bilaterally. Kidneys: Bilateral kidneys enhance symmetrically. Fluid density lesions within bilateral kidneys likely represent simple renal cysts. Subcentimeter hypodensities are too small to characterize. There is a 1.8 cm left renal cystic lesion with a thin septation likely representing a minimally complex renal cyst. No hydronephrosis. No contusion, laceration, or subcapsular hematoma. No injury to the vascular structures or collecting systems. No hydroureter. The urinary bladder is unremarkable. Bowel: No small or large bowel wall thickening or dilatation. Scattered colonic diverticulosis. The appendix not definitely identified. Mesentery, Omentum,  and Peritoneum: No simple free fluid ascites. No pneumoperitoneum. No hemoperitoneum. No mesenteric hematoma identified. No organized fluid collection. Pelvic Organs: Normal. Lymph Nodes: No abdominal, pelvic, inguinal lymphadenopathy. Vasculature: Severe atherosclerotic plaque of the aorta and its branches. No abdominal aorta or iliac aneurysm. No active contrast extravasation or pseudoaneurysm. Musculoskeletal: No significant soft tissue hematoma. No suspicious lytic or blastic osseous lesion. No acute pelvic fracture. No spinal fracture. Avascular necrosis of the right femoral head. IMPRESSION: 1. Left lower lobe 1.7 cm cavitary lung lesion. Associated left hilar lymphadenopathy. Finding could represent infection versus malignancy. Additional imaging evaluation or consultation with Pulmonology or Thoracic Surgery recommended 2. Question cirrhotic morphology with a 3.6 cm right hepatic lobe lesion. Recommend MRI liver protocol for further evaluation. 3. Age-indeterminate, possibly acute, burst fracture of the T12 vertebral body with 2 mm retropulsion into the central canal. Vertebral body height loss of the T11 vertebral body likely due to degenerative  changes. Correlate with point tenderness to palpation for an acute component. Consider MRI lumbar spine for further evaluation. 4. Otherwise no acute traumatic injury to the chest, abdomen, or pelvis. 5. No acute fracture or traumatic malalignment of the lumbar spine. 6. Other imaging findings of potential clinical significance: Cluster of pulmonary nodules within the right middle lobe with area measuring up to 5 mm. Scattered colonic diverticulosis with no acute diverticulitis. Minimally complex left renal cyst. Avascular necrosis of the right femoral head. Aortic Atherosclerosis (ICD10-I70.0). Electronically Signed   By: Iven Finn M.D.   On: 10/16/2020 00:02   CT T-SPINE NO CHARGE  Result Date: 10/16/2020 CLINICAL DATA:  Spinal fracture.  Recent falls. EXAM: CT THORACIC SPINE WITHOUT CONTRAST TECHNIQUE: Multidetector CT images of the thoracic were obtained using the standard protocol without intravenous contrast. CT chest images reconstructed from CT chest abdomen pelvis 10/15/2020 COMPARISON:  CT chest abdomen pelvis 10/15/2020. FINDINGS: Alignment: Normal alignment. Vertebrae: Compression fractures of T12 and L1 which appear acute. Thoracic numbering performed from the craniocervical junction using CT cervical spine 10/15/2020. 30% loss of height of T12 vertebral body due to acute fracture. 40% loss of height of L1 vertebral body. Fractures are not causing significant stenosis. No fracture in the posterior elements. Paraspinal and other soft tissues: Negative for paraspinous mass or adenopathy. Mild bibasilar atelectasis. No pleural effusion. 16 mm left hilar lymph node as described on prior chest CT report Disc levels: Mild disc degeneration is seen throughout the thoracic spine with disc space narrowing and disc calcification. No significant spinal stenosis identified. Mild facet degeneration is present in the thoracic spine. IMPRESSION: Acute fractures superior endplates of X91 and L1 without  significant spinal stenosis. Electronically Signed   By: Franchot Gallo M.D.   On: 10/16/2020 14:29   CT L-SPINE NO CHARGE  Result Date: 10/16/2020 CLINICAL DATA:  Recent fall.  Compression fracture. EXAM: CT LUMBAR SPINE WITHOUT CONTRAST TECHNIQUE: Multidetector CT imaging of the lumbar spine was performed without intravenous contrast administration. Multiplanar CT image reconstructions were also generated. COMPARISON:  None. FINDINGS: Segmentation: S1 is fully lumbarized when counting from the craniocervical junction. Alignment: Normal Vertebrae: Superior endplate fractures of Y78 and L1 appear acute. L1 compression fracture approximately 40% loss of height and T12 fracture approximately 30% loss of height. No significant retropulsion of bone into the canal. No posterior element fractures. No mass lesion identified. Paraspinal and other soft tissues: Vascular contrast present. Lumbar spine images reformatted from CT chest abdomen pelvis with contrast. No paraspinous edema or mass identified. Atherosclerotic aorta. Disc levels:  T12-L1: Disc degeneration and mild spurring. Negative for stenosis L1-2: Negative for stenosis L2-3: Mild disc bulging.  Negative for stenosis L3-4: Mild disc bulging and mild facet hypertrophy. Negative for stenosis L4-5: Moderate disc bulging. Mild to moderate facet hypertrophy bilaterally. Mild spinal stenosis and mild subarticular stenosis bilaterally L5-S1: Disc bulging and facet degeneration. Mild subarticular stenosis bilaterally S1-2: Mild facet degeneration. Mild disc bulging. Negative for stenosis. IMPRESSION: Acute superior endplate fractures of I50 and L1. No significant spinal stenosis. No evidence of underlying mass lesion S1 is fully lumbarized based on counting from the craniocervical junction. Electronically Signed   By: Franchot Gallo M.D.   On: 10/16/2020 14:23   ECHOCARDIOGRAM COMPLETE  Result Date: 10/16/2020    ECHOCARDIOGRAM REPORT   Patient Name:   MARQUISE LAMBSON Date of Exam: 10/16/2020 Medical Rec #:  [id number]    Height:       69.0 in Accession #:    [id number]   Weight:       121.3 lb Date of Birth:  1945-08-07   BSA:          1.670 m Patient Age:    46 years     BP:           96/43 mmHg Patient Gender: F            HR:           90 bpm. Exam Location:  Inpatient Procedure: 2D Echo, Cardiac Doppler and Color Doppler Indications:    CHF  History:        Patient has prior history of Echocardiogram examinations, most                 recent 12/13/2013. Cardiomyopathy and CHF,                 Signs/Symptoms:Syncope; Risk Factors:Hypertension and                 Dyslipidemia.  Sonographer:    Luisa Hart RDCS Referring Phys: [id number] RAFAL POPLAWSKI  Sonographer Comments: Technically difficult study due to poor echo windows. Had to use light touch on patient due to sore ribs. 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 Left ventricular ejection fraction, by estimation, is 30 to 35%. The left ventricle has moderately decreased function. The left ventricle demonstrates global hypokinesis. Left ventricular diastolic parameters are consistent with Grade I diastolic dysfunction (impaired relaxation).  2. Right ventricular systolic function is mildly reduced. The right ventricular size is mildly enlarged. There is normal pulmonary artery systolic pressure. The estimated right ventricular systolic pressure is 29.4 mmHg.  3. The mitral valve is normal in structure. No evidence of mitral valve regurgitation. No evidence of mitral stenosis.  4. The aortic valve is tricuspid. Aortic valve regurgitation is not visualized. No aortic stenosis is present.  5. The inferior vena cava is normal in size with greater than 50% respiratory variability, suggesting right atrial pressure of 3 mmHg. FINDINGS  Left Ventricle: Left ventricular ejection fraction, by estimation, is 30 to 35%. The left ventricle has moderately decreased function. The left ventricle demonstrates global hypokinesis.  The left ventricular internal cavity size was normal in size. There is no left ventricular hypertrophy. Left ventricular diastolic parameters are consistent with Grade I diastolic dysfunction (impaired relaxation). Right Ventricle: The right ventricular size is mildly enlarged. No increase in right ventricular wall thickness. Right ventricular systolic function is mildly reduced. There is normal pulmonary artery systolic pressure. The tricuspid regurgitant velocity  is 2.82 m/s, and with an assumed right atrial pressure of 3 mmHg, the estimated right ventricular systolic pressure is 29.9 mmHg. Left Atrium: Left atrial size was normal in size. Right Atrium: Right atrial size was normal in size. Pericardium: There is no evidence of pericardial effusion. Mitral Valve: The mitral valve is normal in structure. No evidence of mitral valve regurgitation. No evidence of mitral valve stenosis. Tricuspid Valve: The tricuspid valve is normal in structure. Tricuspid valve regurgitation is trivial. Aortic Valve: The aortic valve is tricuspid. Aortic valve regurgitation is not visualized. No aortic stenosis is present. Aortic valve mean gradient measures 2.0 mmHg. Aortic valve peak gradient measures 3.2 mmHg. Aortic valve area, by VTI measures 1.95 cm. Pulmonic Valve: The pulmonic valve was normal in structure. Pulmonic valve regurgitation is trivial. Aorta: The aortic root is normal in size and structure. Venous: The inferior vena cava is normal in size with greater than 50% respiratory variability, suggesting right atrial pressure of 3 mmHg. IAS/Shunts: No atrial level shunt detected by color flow Doppler. Additional Comments: A device lead is visualized in the right ventricle.  LEFT VENTRICLE PLAX 2D LVIDd:         5.40 cm     Diastology LVIDs:         4.20 cm     LV e' medial:    5.08 cm/s LV PW:         1.10 cm     LV E/e' medial:  9.5 LV IVS:        1.10 cm     LV e' lateral:   9.11 cm/s LVOT diam:     2.20 cm     LV E/e'  lateral: 5.3 LV SV:         34 LV SV Index:   20 LVOT Area:     3.80 cm  LV Volumes (MOD) LV vol d, MOD A2C: 29.9 ml LV vol d, MOD A4C: 45.1 ml LV vol s, MOD A2C: 25.2 ml LV vol s, MOD A4C: 36.2 ml LV SV MOD A2C:     4.7 ml LV SV MOD A4C:     45.1 ml LV SV MOD BP:      6.7 ml RIGHT VENTRICLE TAPSE (M-mode): 0.6 cm LEFT ATRIUM             Index       RIGHT ATRIUM          Index LA diam:        3.50 cm 2.10 cm/m  RA Area:     9.02 cm LA Vol (A2C):   26.5 ml 15.87 ml/m RA Volume:   16.70 ml 10.00 ml/m LA Vol (A4C):   25.8 ml 15.45 ml/m LA Biplane Vol: 28.0 ml 16.76 ml/m  AORTIC VALVE                   PULMONIC VALVE AV Area (Vmax):    2.60 cm    PV Vmax:       0.66 m/s AV Area (Vmean):   2.04 cm    PV Vmean:      56.600 cm/s AV Area (VTI):     1.95 cm    PV VTI:        0.165 m AV Vmax:           88.80 cm/s  PV Peak grad:  1.8 mmHg AV Vmean:          67.100 cm/s PV Mean grad:  1.0 mmHg AV VTI:            0.172 m AV Peak Grad:      3.2 mmHg AV Mean Grad:      2.0 mmHg LVOT Vmax:         60.80 cm/s LVOT Vmean:        36.000 cm/s LVOT VTI:          0.088 m LVOT/AV VTI ratio: 0.51  AORTA Ao Root diam: 3.30 cm Ao Asc diam:  3.10 cm MITRAL VALVE               TRICUSPID VALVE MV Area (PHT): 3.23 cm    TR Peak grad:   31.8 mmHg MV Decel Time: 235 msec    TR Vmax:        282.00 cm/s MV E velocity: 48.10 cm/s MV A velocity: 65.60 cm/s  SHUNTS MV E/A ratio:  0.73        Systemic VTI:  0.09 m                            Systemic Diam: 2.20 cm Loralie Champagne MD Electronically signed by Loralie Champagne MD Signature Date/Time: 10/16/2020/3:12:37 PM    Final     Scheduled Meds: . amoxicillin  1,000 mg Oral Q12H  . anastrozole  1 mg Oral Daily  . aspirin EC  81 mg Oral Daily  . atorvastatin  40 mg Oral QHS  . carvedilol  6.25 mg Oral BID WC  . clarithromycin  500 mg Oral Q12H  . enoxaparin (LOVENOX) injection  40 mg Subcutaneous Daily  . folic acid  1 mg Oral Daily  . magnesium oxide  400 mg Oral BID  . metroNIDAZOLE   500 mg Oral Q12H  . multivitamin with minerals  1 tablet Oral Daily  . pantoprazole  40 mg Oral BID  . potassium chloride  20 mEq Oral BID  . thiamine  100 mg Oral Daily  . vitamin B-12  1,000 mcg Oral QODAY   Continuous Infusions: . dextrose 5 % and 0.9 % NaCl with KCl 20 mEq/L 100 mL/hr at 10/17/20 0800     LOS: 1 day   Time spent: 30 minute   Darliss Cheney, MD [facility]  10/17/2020, 12:30 PM   To contact the attending provider between 7A-7P or the covering provider during after hours 7P-7A, please log into the web site [email or web address].

## 2020-10-17 NOTE — TOC CAGE-AID Note (Signed)
Transition of Care (TOC) - CAGE-AID Screening   Patient Details  Name: Dorothy Li MRN: [id number] Date of Birth: 01/18/1946  Transition of Care (TOC) CM/SW Contact:    Wierda, Gregory Jon, LCSW Phone Number: 10/17/2020, 1:15 PM   Clinical Narrative:CSW met with pt for Cage aid screening.  Pt reports daily drinking,  2-3 beers per day, and also "sometimes" one shot of liquor.  Pt denies drug use.  CSW discussed with pt whether alcohol use is causing her problems, whether she has thought about needing to quit, whether her husband/family would say she needed to quit.  Pt denying that she wants to stop use at this time, states she stays home all the time and does not feel like her use is an issue.      CAGE-AID Screening:    Have You Ever Felt You Ought to Cut Down on Your Drinking or Drug Use?: Yes Have People Annoyed You By Critizing Your Drinking Or Drug Use?: No Have You Felt Bad Or Guilty About Your Drinking Or Drug Use?: No Have You Ever Had a Drink or Used Drugs First Thing In The Morning to Steady Your Nerves or to Get Rid of a Hangover?: No CAGE-AID Score: 1  Substance Abuse Education Offered: Yes  Substance abuse interventions: Patient Counseling       

## 2020-10-17 NOTE — Progress Notes (Addendum)
PT Cancellation Note  Patient Details Name: Dorothy Li MRN: [id number] DOB: 05-Nov-1945   Cancelled Treatment:    Reason Eval/Treat Not Completed: Active bedrest order. Bedrest order remains in place. Noted neurosurgery consult 4/14 recommending mobilization in TLSO. Currently awaiting delivery of brace. Please update activity order, when appropriate, for PT to proceed with eval.   Lorriane Shire 10/17/2020, 8:11 AM  Lorrin Goodell, PT  Office # [phone] Pager [phone]

## 2020-10-17 NOTE — TOC Progression Note (Addendum)
Transition of Care [facility]) - Progression Note    Patient Details  Name: Dorothy Li MRN: [id number] Date of Birth: 1946-04-07  Transition of Care [facility]) CM/SW Contact  Joanne Chars, LCSW Phone Number: 10/17/2020, 2:37 PM  Clinical Narrative:  CSW met with pt in her room with niece [name] on speaker phone.  A second niece was present in the room with pt.  Discussed recommendation for [facility] and they are agreeable and also agreeable to using [facility] for the [facility].   HH choice document provided: no preference indicated.   [name] asking about options for [facility] aide in the home.  She requests help with a medicaid application and CSW agreed to contact financial counseling, but also informed her that pt may be discharged prior to them being able to meet with her, discussed she can go to DSS to initiate application as well.  Also discussed [place] [facility]--[facility], not [place], and CSW will contact them regarding potential [place] aide as well.  We also discussed as a group concerns about pt alcohol use and [name] states that the plan is to remove alcohol from the home at this point.  Pt states she is OK with this.   VM left with CSW Evette Doffing at [facility] regarding [facility] aide.      Expected Discharge Plan:  (SNF VS HH) Barriers to Discharge: Continued Medical Work up  Expected Discharge Plan and Services Expected Discharge Plan:  (SNF VS [facility])       Living arrangements for the past 2 months: Single Family Home                                       Social Determinants of Health (SDOH) Interventions    Readmission Risk Interventions No flowsheet data found.

## 2020-10-18 DIAGNOSIS — F10220 Alcohol dependence with intoxication, uncomplicated: Secondary | ICD-10-CM

## 2020-10-18 LAB — CBC WITH DIFFERENTIAL/PLATELET
Abs Immature Granulocytes: 0.02 10*3/uL (ref 0.00–0.07)
Basophils Absolute: 0.1 10*3/uL (ref 0.0–0.1)
Basophils Relative: 2 %
Eosinophils Absolute: 0.1 10*3/uL (ref 0.0–0.5)
Eosinophils Relative: 2 %
HCT: 30 % — ABNORMAL LOW (ref 36.0–46.0)
Hemoglobin: 9.9 g/dL — ABNORMAL LOW (ref 12.0–15.0)
Immature Granulocytes: 0 %
Lymphocytes Relative: 32 %
Lymphs Abs: 1.7 10*3/uL (ref 0.7–4.0)
MCH: 35.1 pg — ABNORMAL HIGH (ref 26.0–34.0)
MCHC: 33 g/dL (ref 30.0–36.0)
MCV: 106.4 fL — ABNORMAL HIGH (ref 80.0–100.0)
Monocytes Absolute: 0.6 10*3/uL (ref 0.1–1.0)
Monocytes Relative: 11 %
Neutro Abs: 2.8 10*3/uL (ref 1.7–7.7)
Neutrophils Relative %: 53 %
Platelets: 224 10*3/uL (ref 150–400)
RBC: 2.82 MIL/uL — ABNORMAL LOW (ref 3.87–5.11)
RDW: 12.6 % (ref 11.5–15.5)
WBC: 5.2 10*3/uL (ref 4.0–10.5)
nRBC: 0 % (ref 0.0–0.2)

## 2020-10-18 LAB — COMPREHENSIVE METABOLIC PANEL
ALT: 31 U/L (ref 0–44)
AST: 34 U/L (ref 15–41)
Albumin: 2.2 g/dL — ABNORMAL LOW (ref 3.5–5.0)
Alkaline Phosphatase: 53 U/L (ref 38–126)
Anion gap: 4 — ABNORMAL LOW (ref 5–15)
BUN: <5 mg/dL — ABNORMAL LOW (ref 8–23)
CO2: 19 mmol/L — ABNORMAL LOW (ref 22–32)
Calcium: 7.6 mg/dL — ABNORMAL LOW (ref 8.9–10.3)
Chloride: 110 mmol/L (ref 98–111)
Creatinine, Ser: 0.64 mg/dL (ref 0.44–1.00)
GFR, Estimated: >60 mL/min (ref >60)
Glucose, Bld: 110 mg/dL — ABNORMAL HIGH (ref 70–99)
Potassium: 6.4 mmol/L (ref 3.5–5.1)
Sodium: 133 mmol/L — ABNORMAL LOW (ref 135–145)
Total Bilirubin: 0.9 mg/dL (ref 0.3–1.2)
Total Protein: 5.2 g/dL — ABNORMAL LOW (ref 6.5–8.1)

## 2020-10-18 LAB — GLUCOSE, CAPILLARY
Glucose-Capillary: 103 mg/dL — ABNORMAL HIGH (ref 70–99)
Glucose-Capillary: 112 mg/dL — ABNORMAL HIGH (ref 70–99)
Glucose-Capillary: 161 mg/dL — ABNORMAL HIGH (ref 70–99)
Glucose-Capillary: 164 mg/dL — ABNORMAL HIGH (ref 70–99)
Glucose-Capillary: 82 mg/dL (ref 70–99)
Glucose-Capillary: 89 mg/dL (ref 70–99)

## 2020-10-18 LAB — POTASSIUM
Potassium: 5.9 mmol/L — ABNORMAL HIGH (ref 3.5–5.1)
Potassium: 4.5 mmol/L (ref 3.5–5.1)

## 2020-10-18 LAB — LACTIC ACID, PLASMA: Lactic Acid, Venous: 1.4 mmol/L (ref 0.5–1.9)

## 2020-10-18 LAB — MAGNESIUM: Magnesium: 1.7 mg/dL (ref 1.7–2.4)

## 2020-10-18 MED ORDER — DEXTROSE 50 % IV SOLN
1.0000 | Freq: Once | INTRAVENOUS | Status: AC
  Administered 2020-10-18: 50 mL via INTRAVENOUS
  Filled 2020-10-18: qty 50

## 2020-10-18 MED ORDER — INSULIN ASPART 100 UNIT/ML ~~LOC~~ SOLN
5.0000 [IU] | Freq: Once | SUBCUTANEOUS | Status: AC
  Administered 2020-10-18: 5 [IU] via INTRAVENOUS

## 2020-10-18 MED ORDER — SODIUM ZIRCONIUM CYCLOSILICATE 10 G PO PACK: 10.0000 g | PACK | Freq: Once | ORAL | Status: DC

## 2020-10-18 MED ORDER — SODIUM BICARBONATE 8.4 % IV SOLN
25.0000 meq | Freq: Once | INTRAVENOUS | Status: AC
  Administered 2020-10-18: 25 meq via INTRAVENOUS
  Filled 2020-10-18: qty 50

## 2020-10-18 NOTE — Progress Notes (Addendum)
0200 Received call from lab who reported critical Potassium @ 6.4. Notified doctor on call.  0230 New orders received for stat EKG-same done,  and repeat potassium level was ordered-notified phlebotomy, pt alert and verbally responsive and in no acute distress. Pt had incontinence of BM- perineal care given.

## 2020-10-18 NOTE — Progress Notes (Signed)
PROGRESS NOTE    Dorothy Li  ZOX:[id number] DOB: 16-Aug-1945 DOA: 10/15/2020 PCP: [facility]   Brief Narrative:  Dorothy Li is a 75 y.o. female with medical history significant of multivessel coronary artery disease, status post CABG, chronic systolic CHF (EF 81%), history of VT, status post implanted cardioverter-defibrillator, history of breast cancer, hypertension, hyperlipidemia, alcohol dependence, was brought to hospital tonight for evaluation of general weakness, multiple falls.  According to patient's niece who is her POA, patient was hospitalized at [facility], for similar condition, where she was found to have severe dehydration with electrolyte abnormalities, in the setting of chronic alcohol dependence and poor oral intake.  She was found to have as well H. pylori infection and placed on quadruple therapy, with PPI, clarithromycin, amoxicillin and metronidazole.  Reportedly she was also found to have lung abscess, and was instructed to continue with Augmentin, upon completion of H. pylori treatment. While at home, patient continued to use alcohol. She developed again a generalized weakness and fatigue.  She had mechanical fall day before presenting to ED when she fell down and hurt her right side of chest, with subsequent ongoing pain in that area.  She continued to have poor oral intake.  Her niece decided to bring her again to hospital for evaluation.  In the ED patient was markedly hypotensive on arrival, with blood pressure 77/48, which improved after administration of fluids, up to 102/63.  Patient was afebrile, temperature 98.2 F.  No hypoxia, 100% room air. Chemistry revealed normal renal function, with mild hyponatremia 131, hypokalemia 2.6 and hypomagnesemia 1.3. LFTs were mildly elevated with normal bilirubin and AST of 63.  Lactic acid was elevated 2.7.    UA negative for infection. Alcohol levels of 318. CT head and cervical spine showed no acute abnormality  specifically no fractures or intracranial pathology. CT chest abdomen pelvis with contrast showed left lower lobe lung 1.7 cm cavitary lesion.  Associated left hilar lymphadenopathy.  Liver with cirrhotic morphology.  Burst fracture of T12 vertebral body. Patient received replacement of potassium magnesium and IV fluids and was admitted to hospital for further evaluation and treatment for multiple medical problems.  Assessment & Plan:   Active Problems:   Essential hypertension   Nonischemic cardiomyopathy/ CAD-CABG   Chronic systolic heart failure (HCC)   Implantable cardioverter-defibrillator (ICD) in situ   Pure hypercholesterolemia   Breast cancer, right breast (HCC)   Hypokalemia   Hypomagnesemia   EtOH dependence (HCC)   FTT (failure to thrive) in adult   Multiple falls   Hypotension in the setting of dehydration: Blood pressure controlled.  Continue increased dose of carvedilol and hold rest of the medications.  Monitor.  Hypokalemia/hyperkalemia: Hypokalemia resolved and now hyperglycemic early morning.  For which she received treatment.  Waiting for repeat potassium level.  Hypomagnesemia: 1.3.  Replaced yesterday.  Rechecking today.  Hypoglycemia: Presented with blood sugar 44.  Required dextrose fluids.  Blood sugar now controlled.  Lactic acidosis: Suspect due to poor oral intake and dehydration.   Had worsened initially but improved yesterday but still elevated.  Rechecking again today.  Hepatic lobe lesion: CT abdomen pelvis shows 3.6 cm right hepatic lobe lesion.  MRI is recommended however patient has ICD which is MRI incompatible so unable to obtain MRI.  T12 vertebral body burst fracture: Seen by neurosurgery.  They recommended TLSO brace and then up and moving with PT.  She has TLSO brace.  PT saw her.  They recommend home  with home health.  Chronic congestive heart failure with reduced ejection fraction (EF 25%) status post AICD/Multivessel coronary artery  disease, status post CABG/Sinus tachycardia: Patient follows with [facility]. Upon review of her medication list, seems to be noncompliant with her cardiac medications. Regimen includes carvedilol, enalapril, hydralazine.  Patient has been having intermittent tachycardia but that is improving as well now that her beta-blocker dose was increased. Echo once again showed ejection fraction of 30 to 35% with grade 1 diastolic dysfunction.  Failure to thrive in adult/ Multiple falls with concern for home safety/ Fracture of the seventh rib on the right: Patient lives with her husband and continue to be alcohol dependence, with poor oral intake, multiple falls. Question safety with further risk of deterioration.  PT OT recommends home health.  Her niece also wants her to come home with home health.  History of breast cancer : continue anastrozole  Essential hypertension: Blood pressure now within normal range.  Continue carvedilol.  Hold rest of the antihypertensive medications.  Hyperlipidemia: Continue atorvastatin  Chronic alcohol dependence/alcohol intoxication/alcohol withdrawal Patient continues to use alcohol daily, stated that she drinks several beers per day.Alcohol level 318 upon admission.  Per chart, she did have CIWA of 11 this morning for which she received 2 mg Ativan around 8:00.  Patient was sleepy and lethargic when I saw her today.  H. pylori infection: Patient was diagnosed with H. pylori infection during recent admission to [facility].  She was started on quadruple therapy with clarithromycin, amoxicillin, drometrizole and PPI which we will continue. She completed perhaps 2 or 3 days of therapy, with plans 2 weeks of treatment.  ?  Lung abscess Patient was told that she has spot on the lung, which requires nearly 6 weeks of antibiotics. Presumably Augmentin. CT chest abdomen pelvis is does show left lower lobe 1.7 cm cavitary lung lesion with associated left hilar  lymphadenopathy, consistent with possible abscess.  She is already on amoxicillin for H. pylori infection.  I have requested records from [place] on 10/16/2020 and have not received them yet.  Diarrhea: Patient has had 3 more bowel movements.  C. difficile ordered yesterday still pending.  Will order enteric pathogen panel as well.  DVT prophylaxis: enoxaparin (LOVENOX) injection 40 mg Start: 10/16/20 1000   Code Status: Full Code  Family Communication: None present at bedside.  Will call her niece later today.  Status is: Inpatient  Remains inpatient appropriate because:Inpatient level of care appropriate due to severity of illness   Dispo: The patient is from: Home              Anticipated d/c is to: Home with home health              Patient currently is not medically stable to d/c.   Difficult to place patient No    Estimated body mass index is 17.91 kg/m as calculated from the following:   Height as of this encounter: 5\' 9"  (1.753 m).   Weight as of this encounter: 55 kg.      Nutritional status:               Consultants:   Neurosurgery  Procedures:   none  Antimicrobials:  Anti-infectives (From admission, onward)   Start     Dose/Rate Route Frequency Ordered Stop   10/16/20 1000  clarithromycin (BIAXIN) tablet 500 mg  Status:  Discontinued        500 mg Oral Every 12 hours 10/15/20 2337 10/15/20  2348   10/16/20 1000  metroNIDAZOLE (FLAGYL) tablet 500 mg  Status:  Discontinued        500 mg Oral Every 12 hours 10/15/20 2337 10/15/20 2348   10/16/20 0000  amoxicillin (AMOXIL) capsule 1,000 mg        1,000 mg Oral Every 12 hours 10/15/20 2337     10/16/20 0000  clarithromycin (BIAXIN) tablet 500 mg        500 mg Oral Every 12 hours 10/15/20 2348     10/16/20 0000  metroNIDAZOLE (FLAGYL) tablet 500 mg        500 mg Oral Every 12 hours 10/15/20 2348           Subjective: Patient seen and examined at around 9:30 AM.  Patient had received 80-minute 8 in  the morning and was very drowsy.  After waking up, she denied any complaint.  Objective: Vitals:   10/18/20 0600 10/18/20 0800 10/18/20 0900 10/18/20 1127  BP: (!) 105/56 (!) 161/94  125/60  Pulse:      Resp:  15  (!) 26  Temp:   98.5 F (36.9 C) 98.5 F (36.9 C)  TempSrc:   Oral Oral  SpO2:  99%  98%  Weight:      Height:        Intake/Output Summary (Last 24 hours) at 10/18/2020 1129 Last data filed at 10/18/2020 0330 Gross per 24 hour  Intake 935.02 ml  Output 300 ml  Net 635.02 ml   Filed Weights   10/15/20 2014  Weight: 55 kg    Examination:  General exam: Appears sleepy Respiratory system: Clear to auscultation. Respiratory effort normal. Cardiovascular system: S1 & S2 heard, RRR. No JVD, murmurs, rubs, gallops or clicks. No pedal edema. Gastrointestinal system: Abdomen is nondistended, soft and nontender. No organomegaly or masses felt. Normal bowel sounds heard. Central nervous system: Sleepy.  Data Reviewed: I have personally reviewed following labs and imaging studies  CBC: Recent Labs  Lab 10/15/20 2015 10/16/20 0139 10/17/20 0122 10/18/20 0030  WBC 4.1 4.0 5.8 5.2  NEUTROABS 2.1  --  3.5 2.8  HGB 9.8* 9.1* 9.7* 9.9*  HCT 28.8* 26.1* 28.1* 30.0*  MCV 104.0* 102.8* 104.5* 106.4*  PLT 236 217 242 834   Basic Metabolic Panel: Recent Labs  Lab 10/15/20 2015 10/15/20 2033 10/16/20 0139 10/17/20 0122 10/18/20 0030 10/18/20 0403  NA 131*  --  135 133* 133*  --   K 2.6*  --  3.4* 4.7 6.4* 5.9*  CL 102  --  105 108 110  --   CO2 17*  --  18* 21* 19*  --   GLUCOSE 72  --  44* 176* 110*  --   BUN <5*  --  <5* <5* <5*  --   CREATININE 0.73  --  0.68 0.74 0.64  --   CALCIUM 7.5*  --  7.4* 7.4* 7.6*  --   MG  --  1.3* 1.7 1.3*  --   --    GFR: Estimated Creatinine Clearance: 53.6 mL/min (by C-G formula based on SCr of 0.64 mg/dL). Liver Function Tests: Recent Labs  Lab 10/15/20 2015 10/17/20 0122 10/18/20 0030  AST 63* 45* 34  ALT 41 35 31   ALKPHOS 62 55 53  BILITOT 0.7 0.7 0.9  PROT 5.8* 5.3* 5.2*  ALBUMIN 2.7* 2.2* 2.2*   No results for input(s): LIPASE, AMYLASE in the last 168 hours. No results for input(s): AMMONIA in the last 168 hours. Coagulation  Profile: No results for input(s): INR, PROTIME in the last 168 hours. Cardiac Enzymes: No results for input(s): CKTOTAL, CKMB, CKMBINDEX, TROPONINI in the last 168 hours. BNP (last 3 results) No results for input(s): PROBNP in the last 8760 hours. HbA1C: No results for input(s): HGBA1C in the last 72 hours. CBG: Recent Labs  Lab 10/17/20 1639 10/17/20 2022 10/18/20 0009 10/18/20 0444 10/18/20 0747  GLUCAP 124* 142* 112* 103* 82   Lipid Profile: No results for input(s): CHOL, HDL, LDLCALC, TRIG, CHOLHDL, LDLDIRECT in the last 72 hours. Thyroid Function Tests: No results for input(s): TSH, T4TOTAL, FREET4, T3FREE, THYROIDAB in the last 72 hours. Anemia Panel: No results for input(s): VITAMINB12, FOLATE, FERRITIN, TIBC, IRON, RETICCTPCT in the last 72 hours. Sepsis Labs: Recent Labs  Lab 10/16/20 1127 10/16/20 1417 10/17/20 1516 10/17/20 1753  LATICACIDVEN 3.5* 4.3* 3.8* 2.7*    Recent Results (from the past 240 hour(s))  Resp Panel by RT-PCR (Flu A&B, Covid) Nasopharyngeal Swab     Status: None   Collection Time: 10/15/20 10:40 PM   Specimen: Nasopharyngeal Swab; Nasopharyngeal(NP) swabs in vial transport medium  Result Value Ref Range Status   SARS Coronavirus 2 by RT PCR NEGATIVE NEGATIVE Final    Comment: (NOTE) SARS-CoV-2 target nucleic acids are NOT DETECTED.  The SARS-CoV-2 RNA is generally detectable in upper respiratory specimens during the acute phase of infection. The lowest concentration of SARS-CoV-2 viral copies this assay can detect is 138 copies/mL. A negative result does not preclude SARS-Cov-2 infection and should not be used as the sole basis for treatment or other patient management decisions. A negative result may occur with   improper specimen collection/handling, submission of specimen other than nasopharyngeal swab, presence of viral mutation(s) within the areas targeted by this assay, and inadequate number of viral copies(<138 copies/mL). A negative result must be combined with clinical observations, patient history, and epidemiological information. The expected result is Negative.  Fact Sheet for Patients:  EntrepreneurPulse.com.au  Fact Sheet for Healthcare Providers:  IncredibleEmployment.be  This test is no t yet approved or cleared by the [place] FDA and  has been authorized for detection and/or diagnosis of SARS-CoV-2 by FDA under an Emergency Use Authorization (EUA). This EUA will remain  in effect (meaning this test can be used) for the duration of the COVID-19 declaration under Section 564(b)(1) of the Act, 21 U.S.C.section 360bbb-3(b)(1), unless the authorization is terminated  or revoked sooner.       Influenza A by PCR NEGATIVE NEGATIVE Final   Influenza B by PCR NEGATIVE NEGATIVE Final    Comment: (NOTE) The Xpert Xpress SARS-CoV-2/FLU/RSV plus assay is intended as an aid in the diagnosis of influenza from Nasopharyngeal swab specimens and should not be used as a sole basis for treatment. Nasal washings and aspirates are unacceptable for Xpert Xpress SARS-CoV-2/FLU/RSV testing.  Fact Sheet for Patients: EntrepreneurPulse.com.au  Fact Sheet for Healthcare Providers: IncredibleEmployment.be  This test is not yet approved or cleared by the [place] FDA and has been authorized for detection and/or diagnosis of SARS-CoV-2 by FDA under an Emergency Use Authorization (EUA). This EUA will remain in effect (meaning this test can be used) for the duration of the COVID-19 declaration under Section 564(b)(1) of the Act, 21 U.S.C. section 360bbb-3(b)(1), unless the authorization is terminated  or revoked.  Performed at [facility], [place] [address] [place], [place] [address]       Radiology Studies: CT T-SPINE NO CHARGE  Result Date: 10/16/2020 CLINICAL DATA:  Spinal fracture.  Recent falls. EXAM: CT THORACIC SPINE WITHOUT CONTRAST TECHNIQUE: Multidetector CT images of the thoracic were obtained using the standard protocol without intravenous contrast. CT chest images reconstructed from CT chest abdomen pelvis 10/15/2020 COMPARISON:  CT chest abdomen pelvis 10/15/2020. FINDINGS: Alignment: Normal alignment. Vertebrae: Compression fractures of T12 and L1 which appear acute. Thoracic numbering performed from the craniocervical junction using CT cervical spine 10/15/2020. 30% loss of height of T12 vertebral body due to acute fracture. 40% loss of height of L1 vertebral body. Fractures are not causing significant stenosis. No fracture in the posterior elements. Paraspinal and other soft tissues: Negative for paraspinous mass or adenopathy. Mild bibasilar atelectasis. No pleural effusion. 16 mm left hilar lymph node as described on prior chest CT report Disc levels: Mild disc degeneration is seen throughout the thoracic spine with disc space narrowing and disc calcification. No significant spinal stenosis identified. Mild facet degeneration is present in the thoracic spine. IMPRESSION: Acute fractures superior endplates of B28 and L1 without significant spinal stenosis. Electronically Signed   By: Franchot Gallo M.D.   On: 10/16/2020 14:29   CT L-SPINE NO CHARGE  Result Date: 10/16/2020 CLINICAL DATA:  Recent fall.  Compression fracture. EXAM: CT LUMBAR SPINE WITHOUT CONTRAST TECHNIQUE: Multidetector CT imaging of the lumbar spine was performed without intravenous contrast administration. Multiplanar CT image reconstructions were also generated. COMPARISON:  None. FINDINGS: Segmentation: S1 is fully lumbarized when counting from the craniocervical junction. Alignment: Normal Vertebrae:  Superior endplate fractures of U13 and L1 appear acute. L1 compression fracture approximately 40% loss of height and T12 fracture approximately 30% loss of height. No significant retropulsion of bone into the canal. No posterior element fractures. No mass lesion identified. Paraspinal and other soft tissues: Vascular contrast present. Lumbar spine images reformatted from CT chest abdomen pelvis with contrast. No paraspinous edema or mass identified. Atherosclerotic aorta. Disc levels: T12-L1: Disc degeneration and mild spurring. Negative for stenosis L1-2: Negative for stenosis L2-3: Mild disc bulging.  Negative for stenosis L3-4: Mild disc bulging and mild facet hypertrophy. Negative for stenosis L4-5: Moderate disc bulging. Mild to moderate facet hypertrophy bilaterally. Mild spinal stenosis and mild subarticular stenosis bilaterally L5-S1: Disc bulging and facet degeneration. Mild subarticular stenosis bilaterally S1-2: Mild facet degeneration. Mild disc bulging. Negative for stenosis. IMPRESSION: Acute superior endplate fractures of K44 and L1. No significant spinal stenosis. No evidence of underlying mass lesion S1 is fully lumbarized based on counting from the craniocervical junction. Electronically Signed   By: Franchot Gallo M.D.   On: 10/16/2020 14:23    Scheduled Meds: . amoxicillin  1,000 mg Oral Q12H  . anastrozole  1 mg Oral Daily  . aspirin EC  81 mg Oral Daily  . atorvastatin  40 mg Oral QHS  . carvedilol  12.5 mg Oral BID WC  . clarithromycin  500 mg Oral Q12H  . enoxaparin (LOVENOX) injection  40 mg Subcutaneous Daily  . folic acid  1 mg Oral Daily  . magnesium oxide  400 mg Oral BID  . metroNIDAZOLE  500 mg Oral Q12H  . multivitamin with minerals  1 tablet Oral Daily  . pantoprazole  40 mg Oral BID  . thiamine  100 mg Oral Daily  . vitamin B-12  1,000 mcg Oral QODAY   Continuous Infusions:    LOS: 2 days   Time spent: 29 minute   Darliss Cheney, MD [facility]  10/18/2020, 11:29 AM   To contact the attending provider between  7A-7P or the covering provider during after hours 7P-7A, please log into the web site [email or web address].

## 2020-10-18 NOTE — Evaluation (Signed)
Occupational Therapy Evaluation Patient Details Name: Dorothy Li MRN: [id number] DOB: Aug 18, 1945 Today's Date: 10/18/2020    History of Present Illness Pt is a 75 y.o. female who presented to the ED following a fall at home. CT revealed T12 and L1 fxs. PMH consists of multivessel coronary artery disease, status post CABG, chronic congestive heart failure with reduced ejection fraction (EF 25%), history of VT, status post implanted cardioverter-defibrillator, history of breast cancer, hypertension, hyperlipidemia, and alcohol dependence.   Clinical Impression   PTA patient reports independent with ADLs, limited IADls. Admitted for above and limited by pain, impaired cognition, decreased activity tolerance and impaired balance. She currently requires min assist +2 for toilet transfers, min assist  +2 for safety with in room mobility using RW, total assist for toileting, min assist for UB ADLS and up to max assist for LB ADLs.  She was educated on precautions and brace mgmt, poor carryover of understanding of precautions functionally.  Based on performance today, believe patient will benefit from further OT services while admitted and after dc at [facility] level to optimize independence and safety with ADLs./mobility.     Follow Up Recommendations  Home health OT;Supervision/Assistance - 24 hour    Equipment Recommendations  3 in 1 bedside commode    Recommendations for Other Services       Precautions / Restrictions Precautions Precautions: Fall;Back Precaution Booklet Issued: Yes (comment) Precaution Comments: Educated on 3/3 back precautions. Handout provided. Required Braces or Orthoses: Spinal Brace Spinal Brace: Thoracolumbosacral orthotic;Applied in sitting position Restrictions Weight Bearing Restrictions: No      Mobility Bed Mobility Overal bed mobility: Needs Assistance Bed Mobility: Rolling;Sidelying to Sit Rolling: Min assist Sidelying to sit: Min assist       General  bed mobility comments: +rail, cueing for sequencing and task completion, min assist for trunk elevation and scooting foward    Transfers Overall transfer level: Needs assistance Equipment used: Rolling walker (2 wheeled) Transfers: Sit to/from Stand Sit to Stand: Min assist;+2 physical assistance;+2 safety/equipment         General transfer comment: cueing for hand placement, safety and posture, min assist +2 to power up from [facility] due to fatigue    Balance Overall balance assessment: Needs assistance Sitting-balance support: No upper extremity supported;Feet supported Sitting balance-Leahy Scale: Fair     Standing balance support: During functional activity;No upper extremity supported;Bilateral upper extremity supported Standing balance-Leahy Scale: Poor Standing balance comment: reliant on BUE support                           ADL either performed or assessed with clinical judgement   ADL Overall ADL's : Needs assistance/impaired     Grooming: Minimal assistance;Standing   Upper Body Bathing: Set up;Sitting   Lower Body Bathing: Sit to/from stand;Moderate assistance   Upper Body Dressing : Minimal assistance;Sitting   Lower Body Dressing: Moderate assistance;Sit to/from stand   Toilet Transfer: Minimal assistance;+2 for physical assistance;+2 for safety/equipment;Ambulation;RW;BSC   Toileting- Clothing Manipulation and Hygiene: Total assistance;+2 for physical assistance;+2 for safety/equipment;Sit to/from stand       Functional mobility during ADLs: Minimal assistance;+2 for safety/equipment;Rolling walker;Cueing for safety;Cueing for sequencing General ADL Comments: pt limited by cognition, weakness, balance     Vision         Perception     Praxis      Pertinent Vitals/Pain Pain Assessment: Faces Faces Pain Scale: Hurts even more Pain Location: back Pain Descriptors /  Indicators: Discomfort;Grimacing;Guarding Pain Intervention(s): Limited  activity within patient's tolerance;Monitored during session;Repositioned     Hand Dominance     Extremity/Trunk Assessment Upper Extremity Assessment Upper Extremity Assessment: Generalized weakness   Lower Extremity Assessment Lower Extremity Assessment: Defer to PT evaluation   Cervical / Trunk Assessment Cervical / Trunk Assessment: Other exceptions Cervical / Trunk Exceptions: TLSO   Communication Communication Communication: No difficulties   Cognition Arousal/Alertness: Lethargic Behavior During Therapy: Flat affect Overall Cognitive Status: Impaired/Different from baseline Area of Impairment: Problem solving;Awareness;Safety/judgement;Following commands;Memory                     Memory: Decreased short-term memory Following Commands: Follows one step commands consistently;Follows one step commands with increased time;Follows multi-step commands inconsistently Safety/Judgement: Decreased awareness of deficits;Decreased awareness of safety Awareness: Emergent Problem Solving: Decreased initiation;Slow processing;Difficulty sequencing;Requires verbal cues General Comments: patient with decreased awareness of safety reporting "I can probably get back to bed on my own", incontinent of  BM during session (but noted improved awareness to need), decreased recall of precautions   General Comments  VSS on RA    Exercises     Shoulder Instructions      Home Living Family/patient expects to be discharged to:: Private residence Living Arrangements: Spouse/significant other Available Help at Discharge: Family;Available 24 hours/day Type of Home: House Home Access: Stairs to enter [employer] of Steps: 3 Entrance Stairs-Rails: Right;Left Home Layout: Laundry or work area in [place] Shower/Tub: Teacher, early years/pre: [place]: Environmental consultant - 2 wheels;Walker - 4 wheels;Cane - single point;Shower seat           Prior Functioning/Environment Level of Independence: Independent with assistive device(s)        Comments: cane for amb, drives, grocery shops        OT Problem List: Decreased strength;Decreased activity tolerance;Impaired balance (sitting and/or standing);Decreased safety awareness;Decreased knowledge of use of DME or AE;Decreased knowledge of precautions;Pain      OT Treatment/Interventions: Self-care/ADL training;DME and/or AE instruction;Therapeutic activities;Cognitive remediation/compensation;Patient/family education;Balance training;Therapeutic exercise    OT Goals(Current goals can be found in the care plan section) Acute Rehab OT Goals Patient Stated Goal: home OT Goal Formulation: With patient Time For Goal Achievement: 11/01/20 Potential to Achieve Goals: Good  OT Frequency: Min 2X/week   Barriers to D/C:            Co-evaluation PT/OT/SLP Co-Evaluation/Treatment: Yes Reason for Co-Treatment: For patient/therapist safety;To address functional/ADL transfers;Other (comment) (incontinence)   OT goals addressed during session: ADL's and self-care      AM-PAC OT "6 Clicks" Daily Activity     Outcome Measure Help from another person eating meals?: A Little Help from another person taking care of personal grooming?: A Little Help from another person toileting, which includes using toliet, bedpan, or urinal?: Total Help from another person bathing (including washing, rinsing, drying)?: A Lot Help from another person to put on and taking off regular upper body clothing?: A Little Help from another person to put on and taking off regular lower body clothing?: A Lot 6 Click Score: 14   End of Session Equipment Utilized During Treatment: Rolling walker;Back brace Nurse Communication: Mobility status  Activity Tolerance: Patient tolerated treatment well Patient left: in chair;with call bell/phone within reach;with chair alarm set  OT Visit Diagnosis: Other  abnormalities of gait and mobility (R26.89);Muscle weakness (generalized) (M62.81);History of falling (Z91.81);Pain Pain - part of body:  (back)  Time: 1441-1511 OT Time Calculation (min): 30 min Charges:  OT General Charges $OT Visit: 1 Visit OT Evaluation $OT Eval Moderate Complexity: 1 Mod  Dorothy Li, OT Acute Rehabilitation Services Pager [phone] Office [phone]   Delight Stare 10/18/2020, 3:30 PM

## 2020-10-18 NOTE — Progress Notes (Signed)
Physical Therapy Treatment Patient Details Name: Dorothy Li MRN: [id number] DOB: 1946-03-15 Today's Date: 10/18/2020    History of Present Illness Pt is a 75 y.o. female who presented to the ED following a fall at home. CT revealed T12 and L1 fxs. PMH consists of multivessel coronary artery disease, status post CABG, chronic congestive heart failure with reduced ejection fraction (EF 25%), history of VT, status post implanted cardioverter-defibrillator, history of breast cancer, hypertension, hyperlipidemia, and alcohol dependence.    PT Comments    Patient found to be incontinent and unaware on arrival (per nursing this is her baseline). Rolling with cues and rail to assist with cleaning pt before OOB. Patient slightly unsteady and "groggy" (had Ativan earlier today) and therefore did not walk in hallway and stayed close in her room. Patient again with incontinent episode during session and assist to [facility]. Required incr assist off of BSC compared to initial transfer due to fatigue. Patient can continue to benefit from PT to address patient goals.     Follow Up Recommendations  Home health PT;Supervision/Assistance - 24 hour     Equipment Recommendations  None recommended by PT    Recommendations for Other Services       Precautions / Restrictions Precautions Precautions: Fall;Back Precaution Booklet Issued: Yes (comment) Precaution Comments: Educated on 3/3 back precautions. Handout provided. Required Braces or Orthoses: Spinal Brace Spinal Brace: Thoracolumbosacral orthotic;Applied in sitting position Restrictions Weight Bearing Restrictions: No    Mobility  Bed Mobility Overal bed mobility: Needs Assistance Bed Mobility: Rolling;Sidelying to Sit Rolling: Min assist Sidelying to sit: Min assist       General bed mobility comments: +rail, cueing for sequencing and task completion, min assist for trunk elevation and scooting foward    Transfers Overall transfer level:  Needs assistance Equipment used: Rolling walker (2 wheeled) Transfers: Sit to/from Stand Sit to Stand: Min assist;+2 physical assistance;+2 safety/equipment         General transfer comment: cueing for hand placement, safety and posture, min assist +2 to power up from [facility] due to fatigue  Ambulation/Gait Ambulation/Gait assistance: Min assist;+2 safety/equipment Gait Distance (Feet): 25 Feet (seated rest; 7 ft) Assistive device: Rolling walker (2 wheeled) Gait Pattern/deviations: Step-through pattern;Decreased stride length     General Gait Details: assist for balance (posterior imbalance as turning); vc for avoiding objects   Stairs             Wheelchair Mobility    Modified Rankin (Stroke Patients Only)       Balance Overall balance assessment: Needs assistance Sitting-balance support: No upper extremity supported;Feet supported Sitting balance-Leahy Scale: Fair     Standing balance support: During functional activity;No upper extremity supported;Bilateral upper extremity supported Standing balance-Leahy Scale: Poor Standing balance comment: reliant on BUE support                            Cognition Arousal/Alertness: Lethargic Behavior During Therapy: Flat affect Overall Cognitive Status: Impaired/Different from baseline Area of Impairment: Problem solving;Awareness;Safety/judgement;Following commands;Memory                     Memory: Decreased short-term memory Following Commands: Follows one step commands consistently;Follows one step commands with increased time;Follows multi-step commands inconsistently Safety/Judgement: Decreased awareness of deficits;Decreased awareness of safety Awareness: Emergent Problem Solving: Decreased initiation;Slow processing;Difficulty sequencing;Requires verbal cues General Comments: patient with decreased awareness of safety reporting "I can probably get back to bed on my  own", incontinent of  BM  during session (but noted improved awareness to need), decreased recall of precautions      Exercises      General Comments General comments (skin integrity, edema, etc.): HR max 119; sats 95% on RA      Pertinent Vitals/Pain Pain Assessment: Faces Faces Pain Scale: Hurts even more Pain Location: back Pain Descriptors / Indicators: Discomfort;Grimacing;Guarding Pain Intervention(s): Limited activity within patient's tolerance;Monitored during session    Home Living Family/patient expects to be discharged to:: Private residence Living Arrangements: Spouse/significant other Available Help at Discharge: Family;Available 24 hours/day Type of Home: House Home Access: Stairs to enter Entrance Stairs-Rails: Right;Left Home Layout: Laundry or work area in [place]: Environmental consultant - 2 wheels;Walker - 4 wheels;Cane - single point;Shower seat      Prior Function Level of Independence: Independent with assistive device(s)      Comments: cane for amb, drives, grocery shops   PT Goals (current goals can now be found in the care plan section) Acute Rehab PT Goals Patient Stated Goal: home Time For Goal Achievement: 10/31/20 Potential to Achieve Goals: Good Progress towards PT goals: Not progressing toward goals - comment (lesser ambulation distance due to grogginess)    Frequency    Min 5X/week      PT Plan Current plan remains appropriate    Co-evaluation PT/OT/SLP Co-Evaluation/Treatment: Yes Reason for Co-Treatment: For patient/therapist safety;To address functional/ADL transfers PT goals addressed during session: Mobility/safety with mobility;Balance;Proper use of DME OT goals addressed during session: ADL's and self-care      AM-PAC PT "6 Clicks" Mobility   Outcome Measure  Help needed turning from your back to your side while in a flat bed without using bedrails?: A Little Help needed moving from lying on your back to sitting on the side of a flat bed  without using bedrails?: A Little Help needed moving to and from a bed to a chair (including a wheelchair)?: A Little Help needed standing up from a chair using your arms (e.g., wheelchair or bedside chair)?: A Little Help needed to walk in hospital room?: A Little Help needed climbing 3-5 steps with a railing? : A Lot 6 Click Score: 17    End of Session Equipment Utilized During Treatment: Gait belt;Back brace Activity Tolerance: Patient tolerated treatment well Patient left: in chair;with call bell/phone within reach;with chair alarm set Nurse Communication: Mobility status;Other (comment) (Was incontinent a second time during therapy session) PT Visit Diagnosis: Difficulty in walking, not elsewhere classified (R26.2);Pain;Muscle weakness (generalized) (M62.81)     Time: 6546-5035 PT Time Calculation (min) (ACUTE ONLY): 43 min  Charges:  $Therapeutic Activity: 23-37 mins                      Arby Barrette, PT Pager [phone]    Rexanne Mano 10/18/2020, 3:42 PM

## 2020-10-19 DIAGNOSIS — I5022 Chronic systolic (congestive) heart failure: Secondary | ICD-10-CM

## 2020-10-19 LAB — CBC WITH DIFFERENTIAL/PLATELET
Abs Immature Granulocytes: 0.02 10*3/uL (ref 0.00–0.07)
Basophils Absolute: 0.1 10*3/uL (ref 0.0–0.1)
Basophils Relative: 2 %
Eosinophils Absolute: 0.1 10*3/uL (ref 0.0–0.5)
Eosinophils Relative: 2 %
HCT: 26.8 % — ABNORMAL LOW (ref 36.0–46.0)
Hemoglobin: 9 g/dL — ABNORMAL LOW (ref 12.0–15.0)
Immature Granulocytes: 1 %
Lymphocytes Relative: 39 %
Lymphs Abs: 1.7 10*3/uL (ref 0.7–4.0)
MCH: 35 pg — ABNORMAL HIGH (ref 26.0–34.0)
MCHC: 33.6 g/dL (ref 30.0–36.0)
MCV: 104.3 fL — ABNORMAL HIGH (ref 80.0–100.0)
Monocytes Absolute: 0.4 10*3/uL (ref 0.1–1.0)
Monocytes Relative: 8 %
Neutro Abs: 2.2 10*3/uL (ref 1.7–7.7)
Neutrophils Relative %: 48 %
Platelets: 210 10*3/uL (ref 150–400)
RBC: 2.57 MIL/uL — ABNORMAL LOW (ref 3.87–5.11)
RDW: 12.7 % (ref 11.5–15.5)
WBC: 4.4 10*3/uL (ref 4.0–10.5)
nRBC: 0 % (ref 0.0–0.2)

## 2020-10-19 LAB — BASIC METABOLIC PANEL
Anion gap: 5 (ref 5–15)
BUN: <5 mg/dL — ABNORMAL LOW (ref 8–23)
CO2: 21 mmol/L — ABNORMAL LOW (ref 22–32)
Calcium: 7.4 mg/dL — ABNORMAL LOW (ref 8.9–10.3)
Chloride: 105 mmol/L (ref 98–111)
Creatinine, Ser: 0.64 mg/dL (ref 0.44–1.00)
GFR, Estimated: >60 mL/min (ref >60)
Glucose, Bld: 82 mg/dL (ref 70–99)
Potassium: 4.5 mmol/L (ref 3.5–5.1)
Sodium: 131 mmol/L — ABNORMAL LOW (ref 135–145)

## 2020-10-19 LAB — GASTROINTESTINAL PANEL BY PCR, STOOL (REPLACES STOOL CULTURE)

## 2020-10-19 LAB — GLUCOSE, CAPILLARY
Glucose-Capillary: 105 mg/dL — ABNORMAL HIGH (ref 70–99)
Glucose-Capillary: 110 mg/dL — ABNORMAL HIGH (ref 70–99)
Glucose-Capillary: 77 mg/dL (ref 70–99)
Glucose-Capillary: 88 mg/dL (ref 70–99)

## 2020-10-19 MED ORDER — CLARITHROMYCIN 500 MG PO TABS: 500.0000 mg | ORAL_TABLET | Freq: Two times a day (BID) | ORAL | 0 refills | Status: AC

## 2020-10-19 NOTE — Discharge Summary (Addendum)
Physician Discharge Summary  Dorothy Li QIO:[id number] DOB: [birth year] DOA: 10/15/2020  PCP: [facility]  Admit date: 10/15/2020 Discharge date: 10/19/2020 30 Day Unplanned Readmission Risk Score   Flowsheet Row ED to Hosp-Admission (Current) from 10/15/2020 in [place]  30 Day Unplanned Readmission Risk Score (%) 22.26 Filed at 10/19/2020 0801     This score is the patient's risk of an unplanned readmission within 30 days of being discharged (0 -100%). The score is based on dignosis, age, lab data, medications, orders, and past utilization.   Low:  0-14.9   Medium: 15-21.9   High: 22-29.9   Extreme: 30 and above         Admitted From: Home Disposition: Home  Recommendations for Outpatient Follow-up:  1. Follow up with PCP in 1-2 weeks 2. Follow-up with neurosurgery in 2 weeks 3. Please obtain BMP/CBC in one week 4. Please follow up with your PCP on the following pending results: Unresulted Labs (From admission, onward)          Start     Ordered   10/18/20 1140  Gastrointestinal Panel by PCR , Stool  (Gastrointestinal Panel by PCR, Stool                                                                                                                                                     **Does Not include CLOSTRIDIUM DIFFICILE testing. **If CDIFF testing is needed, place order from the "C Difficile Testing" order set.**)  Once,   R        10/18/20 1139   10/17/20 0500  CBC with Differential/Platelet  Daily,   R     Question:  Specimen collection method  Answer:  Lab=Lab collect   10/16/20 1115            Home Health: Yes Equipment/Devices: TLSO brace/3 in 1 commode  Discharge Condition: Stable CODE STATUS: Full code Diet recommendation: Cardiac  Subjective: Seen and examined.  Feels better except low back pain which is improving.  No other complaint.  Fully alert and oriented.  Brief/Interim Summary: Dorothy Dickman Foxis a 75  y.o.femalewith medical history significant ofmultivessel coronary artery disease,status post CABG, chronic systolic CHF(EF 32%),GMWNUUV of VT,status post implanted cardioverter-defibrillator,history of breast cancer, hypertension, hyperlipidemia,alcohol dependence,was brought to hospital for evaluation of general weakness, multiple falls.  According to patient's niece who is her POA,[facility] [place],where she was found to have severe dehydration with electrolyte abnormalities,in the setting of chronic alcohol dependence and poor oral intake.She was found to have as well H. pylori infection and placed on quadruple therapy,with PPI, clarithromycin, amoxicillin and metronidazole.Reportedly she was also found to have lung abscess,and was instructed to continue with Augmentin,upon completion of H. pylori treatment.  While at home,patient continued to use alcohol. She developed  again a generalized weakness and fatigue.She had mechanical fall day before presenting to ED when she fell down and hurt her right side of chest,with subsequent ongoing pain in that area.She continued to have poor oral intake.Her niece decided to bring her again to hospital for evaluation.  In the ED patient was markedly hypotensive on arrival,with blood pressure 77/48,which improved after administration of fluids,up to 102/63.Patient was afebrile,temperature 98.2 F.No hypoxia, 100% room air. Chemistry revealed normal renal function,with mild hyponatremia 131,hypokalemia 2.6 and hypomagnesemia 1.3. LFTs were mildly elevated with normal bilirubin and AST of 63.Lactic acid was elevated 2.7.  UA negative for infection. Alcohol levels of 318. CT head and cervical spine showed no acute abnormality specifically no fractures or intracranial pathology. CT chest abdomen pelvis with contrast showed left lower lobe lung 1.7 cm cavitary lesion.  Associated left  hilar lymphadenopathy.  Liver with cirrhotic morphology.  Burst fracture of T12 vertebral body. Patient received replacement of potassium magnesium and IV fluids and was admitted to hospital for further evaluation and treatment for multiple medical problems.  Due to T12 fracture, neurosurgery was consulted and they recommended TLSO brace and then mobility with the PT.  She received TLSO brace.  She was evaluated by PT OT who recommended home health.  Her nieces also wanted her to go home with home health.  Patient did have one episode of loose stool.  C. difficile was ordered however she did not have any loose stool after that.  Semiformed stool was collected for sampling but lab canceled the C. difficile ordered due to the formed stool.  Enteric pathogen panel is pending but patient does not have any diarrhea so I doubt there will be anything positive on that.  Patient was monitored for alcohol withdrawal with CIWA protocol.  She only required Ativan 2 mg once yesterday morning around 8 AM for CIWA of 11, that was her higher CIWA.  It has been almost 5 days since her last alcoholic drink and she does not have any further withdrawal symptoms.  Patient did have hypokalemia initially which was replaced and then she developed hyperkalemia which was treated per protocol as well.  She also had hypomagnesemia which was treated as well.  She was continued on quadruple therapy for her H. pylori infection.  We requested further records from [facility] about possible lung abscess treatment but we never received any records from them either.  After talking to her niece Dorothy Li, I found out that they wanted her to complete her treatment for H. pylori first and then start Augmentin for 6 weeks.  That is being taken care of by her PCP at [facility].  Patient is stable for discharge today.  Hepatic lobe lesion: CT abdomen pelvis shows 3.6 cm right hepatic lobe lesion.  MRI is recommended however patient has ICD which is MRI incompatible  so unable to obtain MRI.  Her niece Dorothy Li told me that her physicians and we are already aware of liver lesion and she has follow-up appointment with them.  I had a very lengthy discussion with her niece about the discharge plan and answered all questions. Discharge Diagnoses:  Active Problems:   Essential hypertension   Nonischemic cardiomyopathy/ CAD-CABG   Chronic systolic heart failure (HCC)   Implantable cardioverter-defibrillator (ICD) in situ   Pure hypercholesterolemia   Breast cancer, right breast (HCC)   Hypokalemia   Hypomagnesemia   EtOH dependence (HCC)   FTT (failure to thrive) in adult   Multiple falls  Discharge Instructions   Allergies as of 10/19/2020      Reactions   Penicillins Other (See Comments)   "blacks out" per pt   Latex Hives   gloves   Tape Itching, Rash   Bandage on breast from bx caused rash      Medication List    STOP taking these medications   erythromycin ophthalmic ointment   magnesium 30 MG tablet   rosuvastatin 40 MG tablet Commonly known as: Crestor   spironolactone 25 MG tablet Commonly known as: ALDACTONE     TAKE these medications   acetaminophen 325 MG tablet Commonly known as: TYLENOL Take 650 mg by mouth every 6 (six) hours as needed for moderate pain or fever.   alendronate 70 MG tablet Commonly known as: FOSAMAX Take 70 mg by mouth every Monday. Take with a full glass of water on an empty stomach.   amoxicillin 500 MG capsule Commonly known as: AMOXIL Take 1,000 mg by mouth See admin instructions. 2 caps bid x 14 days   anastrozole 1 MG tablet Commonly known as: ARIMIDEX Take 1 tablet (1 mg total) by mouth daily.   aspirin EC 81 MG tablet Take 1 tablet (81 mg total) by mouth daily.   atorvastatin 80 MG tablet Commonly known as: LIPITOR Take 40 mg by mouth at bedtime.   Calcium-Vitamin D-Vitamin K 706-237-62 MG-UNT-MCG Tabs Take 1 tablet by mouth daily.   carvedilol 6.25 MG tablet Commonly known  as: COREG Take 6.25 mg by mouth 2 (two) times daily with a meal. What changed: Another medication with the same name was removed. Continue taking this medication, and follow the directions you see here.   clarithromycin 500 MG tablet Commonly known as: BIAXIN Take 1 tablet (500 mg total) by mouth every 12 (twelve) hours for 5 days.   enalapril 2.5 MG tablet Commonly known as: VASOTEC Take 1 tablet (2.5 mg total) by mouth 2 (two) times daily. What changed: when to take this   erythromycin base 500 MG tablet Commonly known as: E-MYCIN Take 500 mg by mouth See admin instructions. Bid x 14 days   hydrALAZINE 50 MG tablet Commonly known as: APRESOLINE Take 25 mg by mouth 3 (three) times daily. What changed: Another medication with the same name was removed. Continue taking this medication, and follow the directions you see here.   LACTOBACILLUS PROBIOTIC PO Take 1 capsule by mouth daily.   metroNIDAZOLE 250 MG tablet Commonly known as: FLAGYL Take 500 mg by mouth See admin instructions. 2 tabs bid x 14 days   multivitamin with minerals tablet Take 1 tablet by mouth daily.   pantoprazole 40 MG tablet Commonly known as: PROTONIX Take 40 mg by mouth 2 (two) times daily before a meal.   sertraline 100 MG tablet Commonly known as: ZOLOFT Take 100 mg by mouth daily. What changed: Another medication with the same name was removed. Continue taking this medication, and follow the directions you see here.   traZODone 50 MG tablet Commonly known as: DESYREL Take 50 mg by mouth at bedtime.   vitamin B-12 1000 MCG tablet Commonly known as: CYANOCOBALAMIN Take 1,000 mcg by mouth every other day.   Vitamin D3 75 MCG (3000 UT) Tabs Take 2,000 Units by mouth. Taking Vitamin D3 2000 units once daily            Durable Medical Equipment  (From admission, onward)         Start     Ordered  10/19/20 1002  For home use only DME Bedside commode  Once       Question:  Patient  needs a bedside commode to treat with the following condition  Answer:  Balance problem   10/19/20 1002          Follow-up Information    Kristeen Miss, MD Follow up in 2 week(s).   Specialty: Neurosurgery Contact information: [address] [place] [address] [phone]        [facility] Follow up.   Specialty: Home Health Services Why: Alvis Lemmings will contact you to schedule your first home visit.  Contact information: [address] [place] [address] [phone]        [facility] [place] Schedule an appointment as soon as possible for a visit.   Specialty: General Practice Why: Please make appt to see primary care MD within 1 week.   Contact information: [address] [place] [address] [phone]              Allergies  Allergen Reactions  . Penicillins Other (See Comments)    "blacks out" per pt   . Latex Hives    gloves  . Tape Itching and Rash    Bandage on breast from bx caused rash    Consultations: Neurosurgery   Procedures/Studies: DG Ribs Unilateral W/Chest Right  Result Date: 10/15/2020 CLINICAL DATA:  [place], pain EXAM: RIGHT RIBS AND CHEST - 3+ VIEW COMPARISON:  11/04/2017 FINDINGS: Frontal view of the chest as well as oblique views of the right thoracic cage are obtained. Please note that the frontal view of the chest is labeled incorrectly, as previous exams demonstrate the pacemaker generator to be within the left chest wall. Pacer/AICD is unchanged. Cardiac silhouette is stable. No airspace disease, effusion, or pneumothorax. There is a minimally displaced right posterolateral seventh rib fracture, best seen on the frontal image. No other acute bony abnormalities. IMPRESSION: 1. Minimally displaced right posterolateral seventh rib fracture. 2. Otherwise no acute intrathoracic process. Electronically Signed   By: Randa Ngo M.D.   On: 10/15/2020 21:31   CT Head Wo Contrast  Result  Date: 10/15/2020 CLINICAL DATA:  Patient's niece reported that patient has not been eating for the past several days , drinking ETOH daily , fell yesterday with right ribcage pain , fatigue and generalized weakness , hypotensive at triage . EXAM: CT HEAD WITHOUT CONTRAST CT CERVICAL SPINE WITHOUT CONTRAST TECHNIQUE: Multidetector CT imaging of the head and cervical spine was performed following the standard protocol without intravenous contrast. Multiplanar CT image reconstructions of the cervical spine were also generated. COMPARISON:  None. FINDINGS: CT HEAD FINDINGS Brain: Cerebral ventricle sizes are concordant with the degree of cerebral volume loss. Patchy and confluent areas of decreased attenuation are noted throughout the deep and periventricular white matter of the cerebral hemispheres bilaterally, compatible with chronic microvascular ischemic disease. No evidence of large-territorial acute infarction. No parenchymal hemorrhage. No mass lesion. No extra-axial collection. No mass effect or midline shift. No hydrocephalus. Basilar cisterns are patent. Vascular: No hyperdense vessel. Atherosclerotic calcifications are present within the cavernous internal carotid arteries. Skull: No acute fracture or focal lesion. Sinuses/Orbits: Paranasal sinuses and mastoid air cells are clear. The orbits are unremarkable. Other: None. CT CERVICAL SPINE FINDINGS Alignment: Normal. Skull base and vertebrae: No acute fracture. No aggressive appearing focal osseous lesion or focal pathologic process. Soft tissues and spinal canal: No prevertebral fluid or swelling. No visible canal  hematoma. Upper chest: Emphysematous changes. Other: Mild to moderate atherosclerotic plaque of the carotid arteries in the neck. IMPRESSION: 1. No acute intracranial abnormality. 2. No acute displaced fracture or traumatic listhesis of the cervical spine. 3.  Emphysema (ICD10-J43.9). Electronically Signed   By: Iven Finn M.D.   On:  10/15/2020 22:03   CT Cervical Spine Wo Contrast  Result Date: 10/15/2020 CLINICAL DATA:  Patient's niece reported that patient has not been eating for the past several days , drinking ETOH daily , fell yesterday with right ribcage pain , fatigue and generalized weakness , hypotensive at triage . EXAM: CT HEAD WITHOUT CONTRAST CT CERVICAL SPINE WITHOUT CONTRAST TECHNIQUE: Multidetector CT imaging of the head and cervical spine was performed following the standard protocol without intravenous contrast. Multiplanar CT image reconstructions of the cervical spine were also generated. COMPARISON:  None. FINDINGS: CT HEAD FINDINGS Brain: Cerebral ventricle sizes are concordant with the degree of cerebral volume loss. Patchy and confluent areas of decreased attenuation are noted throughout the deep and periventricular white matter of the cerebral hemispheres bilaterally, compatible with chronic microvascular ischemic disease. No evidence of large-territorial acute infarction. No parenchymal hemorrhage. No mass lesion. No extra-axial collection. No mass effect or midline shift. No hydrocephalus. Basilar cisterns are patent. Vascular: No hyperdense vessel. Atherosclerotic calcifications are present within the cavernous internal carotid arteries. Skull: No acute fracture or focal lesion. Sinuses/Orbits: Paranasal sinuses and mastoid air cells are clear. The orbits are unremarkable. Other: None. CT CERVICAL SPINE FINDINGS Alignment: Normal. Skull base and vertebrae: No acute fracture. No aggressive appearing focal osseous lesion or focal pathologic process. Soft tissues and spinal canal: No prevertebral fluid or swelling. No visible canal hematoma. Upper chest: Emphysematous changes. Other: Mild to moderate atherosclerotic plaque of the carotid arteries in the neck. IMPRESSION: 1. No acute intracranial abnormality. 2. No acute displaced fracture or traumatic listhesis of the cervical spine. 3.  Emphysema (ICD10-J43.9).  Electronically Signed   By: Iven Finn M.D.   On: 10/15/2020 22:03   CT CHEST ABDOMEN PELVIS W CONTRAST  Result Date: 10/16/2020 CLINICAL DATA:  Trauma. EXAM: CT CHEST, ABDOMEN, AND PELVIS WITH CONTRAST TECHNIQUE: Multidetector CT imaging of the chest, abdomen and pelvis was performed following the standard protocol during bolus administration of intravenous contrast. CONTRAST:  152mL OMNIPAQUE IOHEXOL 300 MG/ML  SOLN COMPARISON:  Chest x-ray 11/04/2017. FINDINGS: CHEST: Ports and Devices: Left chest wall 2 lead cardiac device. Lungs/airways: Bilateral lower lobe subsegmental atelectasis. Left lower lobe scarring versus atelectasis. Left lower lobe 1.7 cm cavitary lung lesion. Cluster of pulmonary micronodules measuring up to 5 mm in area within the right middle lobe (4:89). No focal consolidation. No pulmonary mass. No pulmonary contusion or laceration. The central airways are patent. Pleura: No pleural effusion. No pneumothorax. No hemothorax. Lymph Nodes: There is a 1.6 cm left hilar lymph node (3:30, 5:73). No mediastinal or axillary lymphadenopathy. Mediastinum: No pneumomediastinum. No aortic injury or mediastinal hematoma. The thoracic aorta is normal in caliber. Atherosclerotic plaque of the thoracic aorta as well as four-vessel coronary artery calcifications status post CABG. The heart is normal in size. No significant pericardial effusion. The main pulmonary artery is normal in caliber. No central pulmonary embolus. The esophagus is unremarkable. Subcentimeter hypodense nodules within thyroid gland. Chest Wall / Breasts: No chest wall mass. Musculoskeletal: Subacute to chronic appearing nondisplaced right rib fractures. No acute displaced rib or sternal fracture. Sternotomy wires. Large Schmorl node of the T11 vertebral body with associated vertebral body height loss  centrally. Fracture of the T12 vertebral body involving the anterior and posterior wall as well as superior endplate consistent with  a burst fracture. 2 mm retropulsion into the central canal. No suspicious lytic or blastic osseous lesions. ABDOMEN / PELVIS: Liver: The left hepatic lobe is enlarged with suggestion of of a nodular hepatic contour. The hepatic parenchyma is diffusely hypodense compared to the splenic parenchyma consistent with fatty infiltration. No focal liver abnormality. There is a 3.6 cm hypodense right hepatic lobe lesion (3:51). No gallstones, gallbladder wall thickening, or pericholecystic fluid. No biliary dilatation. No laceration or subcapsular hematoma. Biliary System: The gallbladder is otherwise unremarkable with no radio-opaque gallstones. No biliary ductal dilatation. Pancreas: Normal pancreatic contour. No main pancreatic duct dilatation. Spleen: Not enlarged. No focal lesion. No laceration, subcapsular hematoma, or vascular injury. Adrenal Glands: No nodularity bilaterally. Kidneys: Bilateral kidneys enhance symmetrically. Fluid density lesions within bilateral kidneys likely represent simple renal cysts. Subcentimeter hypodensities are too small to characterize. There is a 1.8 cm left renal cystic lesion with a thin septation likely representing a minimally complex renal cyst. No hydronephrosis. No contusion, laceration, or subcapsular hematoma. No injury to the vascular structures or collecting systems. No hydroureter. The urinary bladder is unremarkable. Bowel: No small or large bowel wall thickening or dilatation. Scattered colonic diverticulosis. The appendix not definitely identified. Mesentery, Omentum, and Peritoneum: No simple free fluid ascites. No pneumoperitoneum. No hemoperitoneum. No mesenteric hematoma identified. No organized fluid collection. Pelvic Organs: Normal. Lymph Nodes: No abdominal, pelvic, inguinal lymphadenopathy. Vasculature: Severe atherosclerotic plaque of the aorta and its branches. No abdominal aorta or iliac aneurysm. No active contrast extravasation or pseudoaneurysm.  Musculoskeletal: No significant soft tissue hematoma. No suspicious lytic or blastic osseous lesion. No acute pelvic fracture. No spinal fracture. Avascular necrosis of the right femoral head. IMPRESSION: 1. Left lower lobe 1.7 cm cavitary lung lesion. Associated left hilar lymphadenopathy. Finding could represent infection versus malignancy. Additional imaging evaluation or consultation with Pulmonology or Thoracic Surgery recommended 2. Question cirrhotic morphology with a 3.6 cm right hepatic lobe lesion. Recommend MRI liver protocol for further evaluation. 3. Age-indeterminate, possibly acute, burst fracture of the T12 vertebral body with 2 mm retropulsion into the central canal. Vertebral body height loss of the T11 vertebral body likely due to degenerative changes. Correlate with point tenderness to palpation for an acute component. Consider MRI lumbar spine for further evaluation. 4. Otherwise no acute traumatic injury to the chest, abdomen, or pelvis. 5. No acute fracture or traumatic malalignment of the lumbar spine. 6. Other imaging findings of potential clinical significance: Cluster of pulmonary nodules within the right middle lobe with area measuring up to 5 mm. Scattered colonic diverticulosis with no acute diverticulitis. Minimally complex left renal cyst. Avascular necrosis of the right femoral head. Aortic Atherosclerosis (ICD10-I70.0). Electronically Signed   By: Iven Finn M.D.   On: 10/16/2020 00:02   CT T-SPINE NO CHARGE  Result Date: 10/16/2020 CLINICAL DATA:  Spinal fracture.  Recent falls. EXAM: CT THORACIC SPINE WITHOUT CONTRAST TECHNIQUE: Multidetector CT images of the thoracic were obtained using the standard protocol without intravenous contrast. CT chest images reconstructed from CT chest abdomen pelvis 10/15/2020 COMPARISON:  CT chest abdomen pelvis 10/15/2020. FINDINGS: Alignment: Normal alignment. Vertebrae: Compression fractures of T12 and L1 which appear acute. Thoracic  numbering performed from the craniocervical junction using CT cervical spine 10/15/2020. 30% loss of height of T12 vertebral body due to acute fracture. 40% loss of height of L1 vertebral body. Fractures are not  causing significant stenosis. No fracture in the posterior elements. Paraspinal and other soft tissues: Negative for paraspinous mass or adenopathy. Mild bibasilar atelectasis. No pleural effusion. 16 mm left hilar lymph node as described on prior chest CT report Disc levels: Mild disc degeneration is seen throughout the thoracic spine with disc space narrowing and disc calcification. No significant spinal stenosis identified. Mild facet degeneration is present in the thoracic spine. IMPRESSION: Acute fractures superior endplates of G64 and L1 without significant spinal stenosis. Electronically Signed   By: Franchot Gallo M.D.   On: 10/16/2020 14:29   CT L-SPINE NO CHARGE  Result Date: 10/16/2020 CLINICAL DATA:  Recent fall.  Compression fracture. EXAM: CT LUMBAR SPINE WITHOUT CONTRAST TECHNIQUE: Multidetector CT imaging of the lumbar spine was performed without intravenous contrast administration. Multiplanar CT image reconstructions were also generated. COMPARISON:  None. FINDINGS: Segmentation: S1 is fully lumbarized when counting from the craniocervical junction. Alignment: Normal Vertebrae: Superior endplate fractures of Q03 and L1 appear acute. L1 compression fracture approximately 40% loss of height and T12 fracture approximately 30% loss of height. No significant retropulsion of bone into the canal. No posterior element fractures. No mass lesion identified. Paraspinal and other soft tissues: Vascular contrast present. Lumbar spine images reformatted from CT chest abdomen pelvis with contrast. No paraspinous edema or mass identified. Atherosclerotic aorta. Disc levels: T12-L1: Disc degeneration and mild spurring. Negative for stenosis L1-2: Negative for stenosis L2-3: Mild disc bulging.  Negative  for stenosis L3-4: Mild disc bulging and mild facet hypertrophy. Negative for stenosis L4-5: Moderate disc bulging. Mild to moderate facet hypertrophy bilaterally. Mild spinal stenosis and mild subarticular stenosis bilaterally L5-S1: Disc bulging and facet degeneration. Mild subarticular stenosis bilaterally S1-2: Mild facet degeneration. Mild disc bulging. Negative for stenosis. IMPRESSION: Acute superior endplate fractures of K74 and L1. No significant spinal stenosis. No evidence of underlying mass lesion S1 is fully lumbarized based on counting from the craniocervical junction. Electronically Signed   By: Franchot Gallo M.D.   On: 10/16/2020 14:23   ECHOCARDIOGRAM COMPLETE  Result Date: 10/16/2020    ECHOCARDIOGRAM REPORT   Patient Name:   PHALLON HAYDU Date of Exam: 10/16/2020 Medical Rec #:  [id number]    Height:       69.0 in Accession #:    [id number]   Weight:       121.3 lb Date of Birth:  1945/12/25   BSA:          1.670 m Patient Age:    41 years     BP:           96/43 mmHg Patient Gender: F            HR:           90 bpm. Exam Location:  Inpatient Procedure: 2D Echo, Cardiac Doppler and Color Doppler Indications:    CHF  History:        Patient has prior history of Echocardiogram examinations, most                 recent 12/13/2013. Cardiomyopathy and CHF,                 Signs/Symptoms:Syncope; Risk Factors:Hypertension and                 Dyslipidemia.  Sonographer:    Luisa Hart RDCS Referring Phys: [id number] RAFAL POPLAWSKI  Sonographer Comments: Technically difficult study due to poor echo windows. Had to use light touch on patient due  to sore ribs. 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 Left ventricular ejection fraction, by estimation, is 30 to 35%. The left ventricle has moderately decreased function. The left ventricle demonstrates global hypokinesis. Left ventricular diastolic parameters are consistent with Grade I diastolic dysfunction (impaired relaxation).  2. Right ventricular  systolic function is mildly reduced. The right ventricular size is mildly enlarged. There is normal pulmonary artery systolic pressure. The estimated right ventricular systolic pressure is 76.7 mmHg.  3. The mitral valve is normal in structure. No evidence of mitral valve regurgitation. No evidence of mitral stenosis.  4. The aortic valve is tricuspid. Aortic valve regurgitation is not visualized. No aortic stenosis is present.  5. The inferior vena cava is normal in size with greater than 50% respiratory variability, suggesting right atrial pressure of 3 mmHg. FINDINGS  Left Ventricle: Left ventricular ejection fraction, by estimation, is 30 to 35%. The left ventricle has moderately decreased function. The left ventricle demonstrates global hypokinesis. The left ventricular internal cavity size was normal in size. There is no left ventricular hypertrophy. Left ventricular diastolic parameters are consistent with Grade I diastolic dysfunction (impaired relaxation). Right Ventricle: The right ventricular size is mildly enlarged. No increase in right ventricular wall thickness. Right ventricular systolic function is mildly reduced. There is normal pulmonary artery systolic pressure. The tricuspid regurgitant velocity  is 2.82 m/s, and with an assumed right atrial pressure of 3 mmHg, the estimated right ventricular systolic pressure is 34.1 mmHg. Left Atrium: Left atrial size was normal in size. Right Atrium: Right atrial size was normal in size. Pericardium: There is no evidence of pericardial effusion. Mitral Valve: The mitral valve is normal in structure. No evidence of mitral valve regurgitation. No evidence of mitral valve stenosis. Tricuspid Valve: The tricuspid valve is normal in structure. Tricuspid valve regurgitation is trivial. Aortic Valve: The aortic valve is tricuspid. Aortic valve regurgitation is not visualized. No aortic stenosis is present. Aortic valve mean gradient measures 2.0 mmHg. Aortic valve  peak gradient measures 3.2 mmHg. Aortic valve area, by VTI measures 1.95 cm. Pulmonic Valve: The pulmonic valve was normal in structure. Pulmonic valve regurgitation is trivial. Aorta: The aortic root is normal in size and structure. Venous: The inferior vena cava is normal in size with greater than 50% respiratory variability, suggesting right atrial pressure of 3 mmHg. IAS/Shunts: No atrial level shunt detected by color flow Doppler. Additional Comments: A device lead is visualized in the right ventricle.  LEFT VENTRICLE PLAX 2D LVIDd:         5.40 cm     Diastology LVIDs:         4.20 cm     LV e' medial:    5.08 cm/s LV PW:         1.10 cm     LV E/e' medial:  9.5 LV IVS:        1.10 cm     LV e' lateral:   9.11 cm/s LVOT diam:     2.20 cm     LV E/e' lateral: 5.3 LV SV:         34 LV SV Index:   20 LVOT Area:     3.80 cm  LV Volumes (MOD) LV vol d, MOD A2C: 29.9 ml LV vol d, MOD A4C: 45.1 ml LV vol s, MOD A2C: 25.2 ml LV vol s, MOD A4C: 36.2 ml LV SV MOD A2C:     4.7 ml LV SV MOD A4C:  45.1 ml LV SV MOD BP:      6.7 ml RIGHT VENTRICLE TAPSE (M-mode): 0.6 cm LEFT ATRIUM             Index       RIGHT ATRIUM          Index LA diam:        3.50 cm 2.10 cm/m  RA Area:     9.02 cm LA Vol (A2C):   26.5 ml 15.87 ml/m RA Volume:   16.70 ml 10.00 ml/m LA Vol (A4C):   25.8 ml 15.45 ml/m LA Biplane Vol: 28.0 ml 16.76 ml/m  AORTIC VALVE                   PULMONIC VALVE AV Area (Vmax):    2.60 cm    PV Vmax:       0.66 m/s AV Area (Vmean):   2.04 cm    PV Vmean:      56.600 cm/s AV Area (VTI):     1.95 cm    PV VTI:        0.165 m AV Vmax:           88.80 cm/s  PV Peak grad:  1.8 mmHg AV Vmean:          67.100 cm/s PV Mean grad:  1.0 mmHg AV VTI:            0.172 m AV Peak Grad:      3.2 mmHg AV Mean Grad:      2.0 mmHg LVOT Vmax:         60.80 cm/s LVOT Vmean:        36.000 cm/s LVOT VTI:          0.088 m LVOT/AV VTI ratio: 0.51  AORTA Ao Root diam: 3.30 cm Ao Asc diam:  3.10 cm MITRAL VALVE                TRICUSPID VALVE MV Area (PHT): 3.23 cm    TR Peak grad:   31.8 mmHg MV Decel Time: 235 msec    TR Vmax:        282.00 cm/s MV E velocity: 48.10 cm/s MV A velocity: 65.60 cm/s  SHUNTS MV E/A ratio:  0.73        Systemic VTI:  0.09 m                            Systemic Diam: 2.20 cm Loralie Champagne MD Electronically signed by Loralie Champagne MD Signature Date/Time: 10/16/2020/3:12:37 PM    Final      Discharge Exam: Vitals:   10/18/20 2010 10/19/20 0525  BP:    Pulse:    Resp:    Temp: 98.4 F (36.9 C) 97.8 F (36.6 C)  SpO2:     Vitals:   10/18/20 1127 10/18/20 1600 10/18/20 2010 10/19/20 0525  BP: 125/60 (!) 147/77    Pulse:  (!) 43    Resp: (!) 22 18    Temp: 98.5 F (36.9 C) 98.7 F (37.1 C) 98.4 F (36.9 C) 97.8 F (36.6 C)  TempSrc: Oral Oral Oral Oral  SpO2: 98% 98%    Weight:      Height:        General: Pt is alert, awake, not in acute distress Cardiovascular: RRR, S1/S2 +, no rubs, no gallops Respiratory: CTA bilaterally, no wheezing, no rhonchi Abdominal: Soft, NT, ND, bowel sounds +  Extremities: no edema, no cyanosis    The results of significant diagnostics from this hospitalization (including imaging, microbiology, ancillary and laboratory) are listed below for reference.     Microbiology: Recent Results (from the past 240 hour(s))  Resp Panel by RT-PCR (Flu A&B, Covid) Nasopharyngeal Swab     Status: None   Collection Time: 10/15/20 10:40 PM   Specimen: Nasopharyngeal Swab; Nasopharyngeal(NP) swabs in vial transport medium  Result Value Ref Range Status   SARS Coronavirus 2 by RT PCR NEGATIVE NEGATIVE Final    Comment: (NOTE) SARS-CoV-2 target nucleic acids are NOT DETECTED.  The SARS-CoV-2 RNA is generally detectable in upper respiratory specimens during the acute phase of infection. The lowest concentration of SARS-CoV-2 viral copies this assay can detect is 138 copies/mL. A negative result does not preclude SARS-Cov-2 infection and should not be  used as the sole basis for treatment or other patient management decisions. A negative result may occur with  improper specimen collection/handling, submission of specimen other than nasopharyngeal swab, presence of viral mutation(s) within the areas targeted by this assay, and inadequate number of viral copies(<138 copies/mL). A negative result must be combined with clinical observations, patient history, and epidemiological information. The expected result is Negative.  Fact Sheet for Patients:  EntrepreneurPulse.com.au  Fact Sheet for Healthcare Providers:  IncredibleEmployment.be  This test is no t yet approved or cleared by the [place] FDA and  has been authorized for detection and/or diagnosis of SARS-CoV-2 by FDA under an Emergency Use Authorization (EUA). This EUA will remain  in effect (meaning this test can be used) for the duration of the COVID-19 declaration under Section 564(b)(1) of the Act, 21 U.S.C.section 360bbb-3(b)(1), unless the authorization is terminated  or revoked sooner.       Influenza A by PCR NEGATIVE NEGATIVE Final   Influenza B by PCR NEGATIVE NEGATIVE Final    Comment: (NOTE) The Xpert Xpress SARS-CoV-2/FLU/RSV plus assay is intended as an aid in the diagnosis of influenza from Nasopharyngeal swab specimens and should not be used as a sole basis for treatment. Nasal washings and aspirates are unacceptable for Xpert Xpress SARS-CoV-2/FLU/RSV testing.  Fact Sheet for Patients: EntrepreneurPulse.com.au  Fact Sheet for Healthcare Providers: IncredibleEmployment.be  This test is not yet approved or cleared by the [place] FDA and has been authorized for detection and/or diagnosis of SARS-CoV-2 by FDA under an Emergency Use Authorization (EUA). This EUA will remain in effect (meaning this test can be used) for the duration of the COVID-19 declaration under Section  564(b)(1) of the Act, 21 U.S.C. section 360bbb-3(b)(1), unless the authorization is terminated or revoked.  Performed at [facility], [place] [address] [place],  [address]      Labs: BNP (last 3 results) Recent Labs    10/15/20 2200  BNP 60.4   Basic Metabolic Panel: Recent Labs  Lab 10/15/20 2015 10/15/20 2033 10/16/20 0139 10/17/20 0122 10/18/20 0030 10/18/20 0403 10/18/20 1100 10/18/20 1143 10/19/20 0423  NA 131*  --  135 133* 133*  --   --   --  131*  K 2.6*  --  3.4* 4.7 6.4* 5.9* 4.5  --  4.5  CL 102  --  105 108 110  --   --   --  105  CO2 17*  --  18* 21* 19*  --   --   --  21*  GLUCOSE 72  --  44* 176* 110*  --   --   --  82  BUN <5*  --  <5* <5* <5*  --   --   --  <5*  CREATININE 0.73  --  0.68 0.74 0.64  --   --   --  0.64  CALCIUM 7.5*  --  7.4* 7.4* 7.6*  --   --   --  7.4*  MG  --  1.3* 1.7 1.3*  --   --   --  1.7  --    Liver Function Tests: Recent Labs  Lab 10/15/20 2015 10/17/20 0122 10/18/20 0030  AST 63* 45* 34  ALT 41 35 31  ALKPHOS 62 55 53  BILITOT 0.7 0.7 0.9  PROT 5.8* 5.3* 5.2*  ALBUMIN 2.7* 2.2* 2.2*   No results for input(s): LIPASE, AMYLASE in the last 168 hours. No results for input(s): AMMONIA in the last 168 hours. CBC: Recent Labs  Lab 10/15/20 2015 10/16/20 0139 10/17/20 0122 10/18/20 0030 10/19/20 0423  WBC 4.1 4.0 5.8 5.2 4.4  NEUTROABS 2.1  --  3.5 2.8 2.2  HGB 9.8* 9.1* 9.7* 9.9* 9.0*  HCT 28.8* 26.1* 28.1* 30.0* 26.8*  MCV 104.0* 102.8* 104.5* 106.4* 104.3*  PLT 236 217 242 224 210   Cardiac Enzymes: No results for input(s): CKTOTAL, CKMB, CKMBINDEX, TROPONINI in the last 168 hours. BNP: Invalid input(s): POCBNP CBG: Recent Labs  Lab 10/18/20 2009 10/19/20 0008 10/19/20 0430 10/19/20 0806 10/19/20 1204  GLUCAP 161* 105* 77 88 110*   D-Dimer No results for input(s): DDIMER in the last 72 hours. Hgb A1c No results for input(s): HGBA1C in the last 72 hours. Lipid Profile No results  for input(s): CHOL, HDL, LDLCALC, TRIG, CHOLHDL, LDLDIRECT in the last 72 hours. Thyroid function studies No results for input(s): TSH, T4TOTAL, T3FREE, THYROIDAB in the last 72 hours.  Invalid input(s): FREET3 Anemia work up No results for input(s): VITAMINB12, FOLATE, FERRITIN, TIBC, IRON, RETICCTPCT in the last 72 hours. Urinalysis    Component Value Date/Time   COLORURINE YELLOW 10/15/2020 2248   APPEARANCEUR HAZY (A) 10/15/2020 2248   LABSPEC 1.005 10/15/2020 2248   PHURINE 6.0 10/15/2020 2248   GLUCOSEU NEGATIVE 10/15/2020 2248   HGBUR NEGATIVE 10/15/2020 2248   BILIRUBINUR NEGATIVE 10/15/2020 2248   KETONESUR NEGATIVE 10/15/2020 2248   PROTEINUR NEGATIVE 10/15/2020 2248   UROBILINOGEN 0.2 01/12/2012 0842   NITRITE NEGATIVE 10/15/2020 2248   LEUKOCYTESUR NEGATIVE 10/15/2020 2248   Sepsis Labs Invalid input(s): PROCALCITONIN,  WBC,  LACTICIDVEN Microbiology Recent Results (from the past 240 hour(s))  Resp Panel by RT-PCR (Flu A&B, Covid) Nasopharyngeal Swab     Status: None   Collection Time: 10/15/20 10:40 PM   Specimen: Nasopharyngeal Swab; Nasopharyngeal(NP) swabs in vial transport medium  Result Value Ref Range Status   SARS Coronavirus 2 by RT PCR NEGATIVE NEGATIVE Final    Comment: (NOTE) SARS-CoV-2 target nucleic acids are NOT DETECTED.  The SARS-CoV-2 RNA is generally detectable in upper respiratory specimens during the acute phase of infection. The lowest concentration of SARS-CoV-2 viral copies this assay can detect is 138 copies/mL. A negative result does not preclude SARS-Cov-2 infection and should not be used as the sole basis for treatment or other patient management decisions. A negative result may occur with  improper specimen collection/handling, submission of specimen other than nasopharyngeal swab, presence of viral mutation(s) within the areas targeted by this assay, and inadequate number of viral copies(<138 copies/mL). A negative result must be  combined with clinical observations, patient history, and epidemiological information. The expected result  is Negative.  Fact Sheet for Patients:  EntrepreneurPulse.com.au  Fact Sheet for Healthcare Providers:  IncredibleEmployment.be  This test is no t yet approved or cleared by the [place] FDA and  has been authorized for detection and/or diagnosis of SARS-CoV-2 by FDA under an Emergency Use Authorization (EUA). This EUA will remain  in effect (meaning this test can be used) for the duration of the COVID-19 declaration under Section 564(b)(1) of the Act, 21 U.S.C.section 360bbb-3(b)(1), unless the authorization is terminated  or revoked sooner.       Influenza A by PCR NEGATIVE NEGATIVE Final   Influenza B by PCR NEGATIVE NEGATIVE Final    Comment: (NOTE) The Xpert Xpress SARS-CoV-2/FLU/RSV plus assay is intended as an aid in the diagnosis of influenza from Nasopharyngeal swab specimens and should not be used as a sole basis for treatment. Nasal washings and aspirates are unacceptable for Xpert Xpress SARS-CoV-2/FLU/RSV testing.  Fact Sheet for Patients: EntrepreneurPulse.com.au  Fact Sheet for Healthcare Providers: IncredibleEmployment.be  This test is not yet approved or cleared by the [place] FDA and has been authorized for detection and/or diagnosis of SARS-CoV-2 by FDA under an Emergency Use Authorization (EUA). This EUA will remain in effect (meaning this test can be used) for the duration of the COVID-19 declaration under Section 564(b)(1) of the Act, 21 U.S.C. section 360bbb-3(b)(1), unless the authorization is terminated or revoked.  Performed at [facility], [place] [address], [place], [place] [address]      Time coordinating discharge: Over 30 minutes  SIGNED:   Darliss Cheney, MD  [facility] 10/19/2020, 12:48 PM  If 7PM-7AM, please contact  night-coverage [email or web address]

## 2020-10-19 NOTE — TOC Transition Note (Signed)
Transition of Care [facility]) - CM/SW Discharge Note   Patient Details  Name: Dorothy Li MRN: [id number] Date of Birth: Feb 24, 1946  Transition of Care [facility]) CM/SW Contact:  Joanne Chars, LCSW Phone Number: 10/19/2020, 10:46 AM   Clinical Narrative:   Pt discharging home with [facility].  No equipment needs: 3n1 recommended but pt has access to one already.  CSW spoke with pt niece [name] and reviewed efforts to contact financial counseling regarding medicaid application and also the [facility] for [facility] aide. PCP is listed as [name], but pt is actually seen at [facility]  Niece reports that pt already has PCP appt at [facility] on June 7.  She is going to attempt to move this appt up on Monday.  No other needs identified.      Final next level of care: [place] Barriers to Discharge: Barriers Resolved   Patient Goals and CMS Choice        Discharge Placement                  Name of family member notified: [name], neice Patient and family notified of of transfer: 10/19/20  Discharge Plan and Services                DME Arranged:  (pt already has access to 3n1)         HH Arranged: PT,OT [place] Agency: [place] Date [facility] Agency Contacted: 10/19/20 Time [place]: [phone] Representative spoke with at [place]: [place] ([place]) Interventions     Readmission Risk Interventions Readmission Risk Prevention Plan 10/19/2020  Transportation Screening Complete  PCP or Specialist Appt within 3-5 Days Not Complete  Not Complete comments Per Neice, pt has appt on June 7.  She will attempt to move this appt up on Monday.  [place] or Home Care Consult Complete  Social Work Consult for [place] Planning/Counseling Complete  Palliative Care Screening Not Applicable  Some recent data might be hidden

## 2020-10-19 NOTE — Discharge Instructions (Signed)
Alcohol Intoxication Alcohol intoxication occurs when a person no longer thinks clearly or functions well (becomes impaired) after drinking alcohol. Intoxication can occur with just one drink. The legal definition of alcohol intoxication depends on the amount of alcohol in the blood (blood alcohol concentration, BAC). BAC of 80-100 mg/dL or higher is commonly considered legally intoxicated. The level of impairment depends on:  The amount of alcohol the person had.  The person's age, gender, and weight.  How often the person drinks.  Whether the person has other medical conditions, such as diabetes, seizures, or a heart condition. Alcohol intoxication can range from mild to severe. The condition can be dangerous, especially if the person:  Also took certain drugs or prescription medicines.  Drinks a large amount of alcohol in a short period of time (binge drinks). ? For women, binge drinking is having four or more drinks at one time. ? For men, binge drinking is having five or more drinks at one time. If you or anyone around you appears intoxicated, speak up and act. What are the causes? This condition is caused by drinking alcohol. What increases the risk? The following factors may make you more likely to develop this condition:  Peer pressure in young adults.  Difficulty managing stress.  History of drug or alcohol abuse.  Combining alcohol with drugs.  Family history of drug or alcohol abuse.  Low body weight.  Binge drinking. What are the signs or symptoms? Symptoms of alcohol intoxication can vary from person to person. Symptoms can be mild, moderate, or severe. Symptoms of mild alcohol intoxication may include:  Feeling relaxed or sleepy.  Having mild difficulty with coordination, speech, memory, or attention. Symptoms of moderate alcohol intoxication may include:  Extreme emotions, like anger or sadness.  Moderate difficulty with coordination, speech, memory, or  attention. Symptoms of severe alcohol intoxication may include:  Severe difficulty with coordination, speech, memory, or attention.  Passing out.  Vomiting.  Confusion.  Slow breathing.  Coma. Intoxication can change quickly from mild to severe. It can cause coma or death, especially in people who are not exposed to alcohol often. How is this diagnosed? Your health care provider will ask you how much alcohol you drank and what kind you had. Intoxication may also be diagnosed based on:  Your symptoms and medical history.  A physical exam.  A blood test that measures BAC.  A smell of alcohol on your breath. How is this treated? Treatment for alcohol intoxication may include:  Being monitored in an emergency department, hospital, or treatment center until your [facility] comes down and it is safe for you to go home.  IV fluids to prevent or treat loss of fluid in the body (dehydration).  Medicine to treat nausea or vomiting or to get rid of alcohol in the body.  Counseling (brief intervention) about the dangers of using alcohol.  Treatment for substance use disorder.  Oxygen therapy or a breathing machine (ventilator). Long-term (chronic) exposure to alcohol can have long-term effects on your brain, heart, and gastrointestinal system. These effects can be serious and may also require treatment. Follow these instructions at home: Eating and drinking  Do not drink alcohol if: ? Your health care provider tells you not to drink. ? You are pregnant, may be pregnant, or are planning to become pregnant. ? You are under the legal drinking age (75 years old in the [place] ? You are taking medicines that should not be taken with alcohol. ? You have a  medical condition, and alcohol makes it worse. ? You need to drive or perform activities that require you to be alert. ? You have substance use disorder.  Ask your health care provider if alcohol is safe for you. If your health care  provider allows you to drink alcohol, limit how much you have. You may drink: ? 0-1 drink a day for women. ? 0-2 drinks a day for men.  Be aware of how much alcohol is in your drink. In the [place] one drink equals one 12 oz bottle of beer (355 mL), one 5 oz glass of wine (148 mL), or one 1 oz shot of hard liquor (44 mL).  Avoid drinking alcohol on an empty stomach.  Stay hydrated. Drink enough fluid to keep your urine pale yellow. Avoid caffeine because it can dehydrate you.  Avoid drinking more than one drink per hour.  When having multiple drinks, drink water or a non-alcoholic beverage between alcoholic drinks.   General instructions  Take over-the-counter and prescription medicines only as told by your health care provider.  Do not drive after drinking any amount of alcohol. Plan for a designated driver or another way to go home.  Have someone responsible stay with you while you are intoxicated. You should not be left alone.  Keep all follow-up visits as told by your health care provider. This is important.   Contact a health care provider if:  You do not feel better after a few days.  You have problems at work, at school, or at home due to drinking. Get help right away if:  You have any of the following: ? Moderate to severe trouble with coordination, speech, memory, or attention. ? Trouble staying awake. ? Severe confusion. ? A seizure. ? Light-headedness. ? Fainting. ? Vomiting bright red blood or material that looks like coffee grounds. ? Bloody stool (feces). The blood may make your stool bright red, black, or tarry. It may also smell bad. ? Shakiness when trying to stop drinking. ? Thoughts about hurting yourself or others. If you ever feel like you may hurt yourself or others, or have thoughts about taking your own life, get help right away. You can go to your nearest emergency department or call:  Your local emergency services (911 in the [place]  A suicide  crisis helpline, such as the [place] at (313)345-2689. This is open 24 hours a day. Summary  Alcohol intoxication occurs when a person no longer thinks clearly or functions well after drinking alcohol.  If your health care provider says that alcohol is safe for you, limit alcohol intake to no more than 1 drink a day for women (no drinks if you are pregnant) and 2 drinks a day for men. One drink equals 12 oz of beer, 5 oz of wine, or 1 oz of hard liquor.  Contact your health care provider if drinking has caused you problems at work, school, or home.  Get help right away if you have thoughts about hurting yourself or others. This information is not intended to replace advice given to you by your health care provider. Make sure you discuss any questions you have with your health care provider. Document Revised: 10/11/2017 Document Reviewed: 10/11/2017 Elsevier Patient Education  [place].

## 2020-10-19 NOTE — Progress Notes (Signed)
Physical Therapy Treatment Patient Details Name: Dorothy Li MRN: [id number] DOB: Apr 06, 1946 Today's Date: 10/19/2020    History of Present Illness Pt is a 75 y.o. female who presented to the ED following a fall at home. CT revealed T12 and L1 fxs. PMH consists of multivessel coronary artery disease, status post CABG, chronic congestive heart failure with reduced ejection fraction (EF 25%), history of VT, status post implanted cardioverter-defibrillator, history of breast cancer, hypertension, hyperlipidemia, and alcohol dependence.    PT Comments    Patient being discharged and family requesting PT session for education on donning brace. In addition, reviewed basic mobility (rolling, side to sit, transfer, gait with RW). Family reported pt has a rollator at home and discussed the benefit of RW compared to rollator. Family said they have access to RW and called to confirm while PT present. All questions answered.    Follow Up Recommendations  Home health PT;Supervision/Assistance - 24 hour     Equipment Recommendations  None recommended by PT    Recommendations for Other Services OT consult     Precautions / Restrictions Precautions Precautions: Fall;Back Precaution Booklet Issued: Yes (comment) Precaution Comments: Educated on 3/3 back precautions. Handout provided. Required Braces or Orthoses: Spinal Brace Spinal Brace: Thoracolumbosacral orthotic;Applied in sitting position (educated family on how to don in sitting)    Mobility  Bed Mobility Overal bed mobility: Needs Assistance Bed Mobility: Rolling;Sidelying to Sit Rolling: Supervision Sidelying to sit: Min assist       General bed mobility comments: cueing for sequencing and task completion, min assist for trunk elevation    Transfers Overall transfer level: Needs assistance Equipment used: Rolling walker (2 wheeled) Transfers: Sit to/from Stand Sit to Stand: Min assist;+2 safety/equipment Stand pivot  transfers: Min assist       General transfer comment: cueing for hand placement, safety and posture  Ambulation/Gait Ambulation/Gait assistance: Min assist Gait Distance (Feet): 4 Feet Assistive device: Rolling walker (2 wheeled) Gait Pattern/deviations: Step-through pattern;Decreased stride length Gait velocity: decreased   General Gait Details: assist for maneuvering RW   Stairs             Wheelchair Mobility    Modified Rankin (Stroke Patients Only)       Balance Overall balance assessment: Needs assistance Sitting-balance support: No upper extremity supported;Feet supported Sitting balance-Leahy Scale: Fair     Standing balance support: During functional activity;No upper extremity supported;Bilateral upper extremity supported Standing balance-Leahy Scale: Poor Standing balance comment: reliant on BUE support                            Cognition Arousal/Alertness: Awake/alert Behavior During Therapy: Flat affect Overall Cognitive Status: Impaired/Different from baseline Area of Impairment: Problem solving;Awareness;Safety/judgement;Following commands;Memory                     Memory: Decreased short-term memory Following Commands: Follows one step commands consistently;Follows one step commands with increased time;Follows multi-step commands inconsistently Safety/Judgement: Decreased awareness of deficits;Decreased awareness of safety Awareness: Emergent Problem Solving: Decreased initiation;Slow processing;Difficulty sequencing;Requires verbal cues General Comments: continues to require cues for back precautions; incr time to initiate and at times required tactile cues      Exercises      General Comments General comments (skin integrity, edema, etc.): Family present and requested session with PT for education. By end of session they had no further questions      Pertinent Vitals/Pain Pain Assessment: No/denies  pain    Home  Living                      Prior Function            PT Goals (current goals can now be found in the care plan section) Acute Rehab PT Goals Patient Stated Goal: home PT Goal Formulation: With patient/family Time For Goal Achievement: 10/31/20 Potential to Achieve Goals: Good Progress towards PT goals: Progressing toward goals    Frequency    Min 5X/week      PT Plan Current plan remains appropriate    Co-evaluation              AM-PAC PT "6 Clicks" Mobility   Outcome Measure  Help needed turning from your back to your side while in a flat bed without using bedrails?: A Little Help needed moving from lying on your back to sitting on the side of a flat bed without using bedrails?: A Little Help needed moving to and from a bed to a chair (including a wheelchair)?: A Little Help needed standing up from a chair using your arms (e.g., wheelchair or bedside chair)?: A Little Help needed to walk in hospital room?: A Little Help needed climbing 3-5 steps with a railing? : A Lot 6 Click Score: 17    End of Session Equipment Utilized During Treatment: Back brace Activity Tolerance: Patient tolerated treatment well Patient left: in chair;with call bell/phone within reach;with family/visitor present Nurse Communication: Mobility status PT Visit Diagnosis: Difficulty in walking, not elsewhere classified (R26.2);Pain;Muscle weakness (generalized) (M62.81)     Time: 7412-8786 PT Time Calculation (min) (ACUTE ONLY): 12 min  Charges:  $Self Care/Home Management: 8-22                      Arby Barrette, PT Pager [phone]    Rexanne Mano 10/19/2020, 3:22 PM

## 2020-10-28 ENCOUNTER — Other Ambulatory Visit: Payer: Self-pay

## 2020-10-28 ENCOUNTER — Inpatient Hospital Stay (HOSPITAL_COMMUNITY)
Admission: EM | Admit: 2020-10-28 | Discharge: 2020-10-30 | DRG: 194 | Disposition: A | Discharge status: Home / Self Care | Payer: No Typology Code available for payment source | Attending: Internal Medicine | Admitting: Internal Medicine

## 2020-10-28 DIAGNOSIS — Z8249 Family history of ischemic heart disease and other diseases of the circulatory system: Secondary | ICD-10-CM

## 2020-10-28 DIAGNOSIS — K7460 Unspecified cirrhosis of liver: Secondary | ICD-10-CM | POA: Diagnosis present

## 2020-10-28 DIAGNOSIS — Z7982 Long term (current) use of aspirin: Secondary | ICD-10-CM

## 2020-10-28 DIAGNOSIS — S32019D Unspecified fracture of first lumbar vertebra, subsequent encounter for fracture with routine healing: Secondary | ICD-10-CM

## 2020-10-28 DIAGNOSIS — Z803 Family history of malignant neoplasm of breast: Secondary | ICD-10-CM

## 2020-10-28 DIAGNOSIS — Z95810 Presence of automatic (implantable) cardiac defibrillator: Secondary | ICD-10-CM

## 2020-10-28 DIAGNOSIS — Z880 Allergy status to penicillin: Secondary | ICD-10-CM

## 2020-10-28 DIAGNOSIS — F1020 Alcohol dependence, uncomplicated: Secondary | ICD-10-CM | POA: Diagnosis present

## 2020-10-28 DIAGNOSIS — Z79811 Long term (current) use of aromatase inhibitors: Secondary | ICD-10-CM

## 2020-10-28 DIAGNOSIS — W19XXXD Unspecified fall, subsequent encounter: Secondary | ICD-10-CM | POA: Diagnosis present

## 2020-10-28 DIAGNOSIS — S2231XD Fracture of one rib, right side, subsequent encounter for fracture with routine healing: Secondary | ICD-10-CM

## 2020-10-28 DIAGNOSIS — I5042 Chronic combined systolic (congestive) and diastolic (congestive) heart failure: Secondary | ICD-10-CM | POA: Diagnosis present

## 2020-10-28 DIAGNOSIS — Z853 Personal history of malignant neoplasm of breast: Secondary | ICD-10-CM

## 2020-10-28 DIAGNOSIS — Z923 Personal history of irradiation: Secondary | ICD-10-CM

## 2020-10-28 DIAGNOSIS — I429 Cardiomyopathy, unspecified: Secondary | ICD-10-CM | POA: Diagnosis present

## 2020-10-28 DIAGNOSIS — I2510 Atherosclerotic heart disease of native coronary artery without angina pectoris: Secondary | ICD-10-CM | POA: Diagnosis present

## 2020-10-28 DIAGNOSIS — E785 Hyperlipidemia, unspecified: Secondary | ICD-10-CM | POA: Diagnosis present

## 2020-10-28 DIAGNOSIS — I110 Hypertensive heart disease with heart failure: Secondary | ICD-10-CM | POA: Diagnosis present

## 2020-10-28 DIAGNOSIS — J189 Pneumonia, unspecified organism: Principal | ICD-10-CM | POA: Diagnosis present

## 2020-10-28 DIAGNOSIS — D539 Nutritional anemia, unspecified: Secondary | ICD-10-CM | POA: Diagnosis present

## 2020-10-28 DIAGNOSIS — R9431 Abnormal electrocardiogram [ECG] [EKG]: Secondary | ICD-10-CM | POA: Diagnosis present

## 2020-10-28 DIAGNOSIS — Z91040 Latex allergy status: Secondary | ICD-10-CM

## 2020-10-28 DIAGNOSIS — E876 Hypokalemia: Secondary | ICD-10-CM | POA: Diagnosis present

## 2020-10-28 DIAGNOSIS — Z951 Presence of aortocoronary bypass graft: Secondary | ICD-10-CM

## 2020-10-28 DIAGNOSIS — Z87891 Personal history of nicotine dependence: Secondary | ICD-10-CM

## 2020-10-28 DIAGNOSIS — Z833 Family history of diabetes mellitus: Secondary | ICD-10-CM

## 2020-10-28 DIAGNOSIS — Z7983 Long term (current) use of bisphosphonates: Secondary | ICD-10-CM

## 2020-10-28 DIAGNOSIS — Z20822 Contact with and (suspected) exposure to covid-19: Secondary | ICD-10-CM | POA: Diagnosis present

## 2020-10-28 DIAGNOSIS — E43 Unspecified severe protein-calorie malnutrition: Secondary | ICD-10-CM | POA: Insufficient documentation

## 2020-10-28 DIAGNOSIS — Z888 Allergy status to other drugs, medicaments and biological substances status: Secondary | ICD-10-CM

## 2020-10-28 DIAGNOSIS — Z79899 Other long term (current) drug therapy: Secondary | ICD-10-CM

## 2020-10-28 LAB — BASIC METABOLIC PANEL
Anion gap: 7 (ref 5–15)
BUN: 9 mg/dL (ref 8–23)
CO2: 26 mmol/L (ref 22–32)
Calcium: 8.4 mg/dL — ABNORMAL LOW (ref 8.9–10.3)
Chloride: 106 mmol/L (ref 98–111)
Creatinine, Ser: 0.63 mg/dL (ref 0.44–1.00)
GFR, Estimated: >60 mL/min (ref >60)
Glucose, Bld: 93 mg/dL (ref 70–99)
Potassium: 3.4 mmol/L — ABNORMAL LOW (ref 3.5–5.1)
Sodium: 139 mmol/L (ref 135–145)

## 2020-10-28 LAB — CBC
HCT: 28.9 % — ABNORMAL LOW (ref 36.0–46.0)
Hemoglobin: 9.3 g/dL — ABNORMAL LOW (ref 12.0–15.0)
MCH: 34.1 pg — ABNORMAL HIGH (ref 26.0–34.0)
MCHC: 32.2 g/dL (ref 30.0–36.0)
MCV: 105.9 fL — ABNORMAL HIGH (ref 80.0–100.0)
Platelets: 264 10*3/uL (ref 150–400)
RBC: 2.73 MIL/uL — ABNORMAL LOW (ref 3.87–5.11)
RDW: 13.3 % (ref 11.5–15.5)
WBC: 5.2 10*3/uL (ref 4.0–10.5)
nRBC: 0 % (ref 0.0–0.2)

## 2020-10-28 MED ORDER — SODIUM CHLORIDE 0.9 % IV BOLUS
1000.0000 mL | Freq: Once | INTRAVENOUS | Status: AC
  Administered 2020-10-29: 1000 mL via INTRAVENOUS

## 2020-10-28 NOTE — ED Triage Notes (Addendum)
Pt presents to ED POV. Pt told to come here by her PCP. Pt's been tachycardic and hypotensive x1w. Care provider reports decreased PO intake

## 2020-10-28 NOTE — ED Triage Notes (Signed)
Emergency Medicine Provider Triage Evaluation Note  KATRYNA TSCHIRHART , a 75 y.o. female  was evaluated in triage.  Pt complains of generalized weakness.  Review of Systems  Positive: Diarrhea, weakness Negative: Fever, cough, dysuria  Physical Exam  BP 114/82 (BP Location: Left Arm)   Pulse (!) 109   Temp 99.3 F (37.4 C) (Oral)   Resp 12   SpO2 98%  Gen:   Awake, no distress   HEENT:  Atraumatic  Resp:  Normal effort, wearing chest brace Cardiac:  tachycardic  Abd:   Nondistended, nontender  MSK:   Moves extremities without difficulty  Neuro:  Speech clear   Medical Decision Making  Medically screening exam initiated at 7:18 PM.  Appropriate orders placed.  FARRON LAFOND was informed that the remainder of the evaluation will be completed by another provider, this initial triage assessment does not replace that evaluation, and the importance of remaining in the ED until their evaluation is complete.  Clinical Impression  Recently hospitalized for dehydration and H.pylori infection, on abx.  Have been tachycardic and hypotensive with recurrent diarrhea, decrease fluid intake.    Domenic Moras, PA-C 10/28/20 1920

## 2020-10-29 ENCOUNTER — Emergency Department (HOSPITAL_COMMUNITY): Payer: No Typology Code available for payment source

## 2020-10-29 ENCOUNTER — Inpatient Hospital Stay (HOSPITAL_COMMUNITY): Payer: No Typology Code available for payment source

## 2020-10-29 ENCOUNTER — Telehealth: Payer: Self-pay | Admitting: Pulmonary Disease

## 2020-10-29 ENCOUNTER — Encounter (HOSPITAL_COMMUNITY): Payer: Self-pay | Admitting: Family Medicine

## 2020-10-29 DIAGNOSIS — R9431 Abnormal electrocardiogram [ECG] [EKG]: Secondary | ICD-10-CM

## 2020-10-29 DIAGNOSIS — W19XXXD Unspecified fall, subsequent encounter: Secondary | ICD-10-CM | POA: Diagnosis present

## 2020-10-29 DIAGNOSIS — R599 Enlarged lymph nodes, unspecified: Secondary | ICD-10-CM

## 2020-10-29 DIAGNOSIS — Z7983 Long term (current) use of bisphosphonates: Secondary | ICD-10-CM | POA: Diagnosis not present

## 2020-10-29 DIAGNOSIS — Z923 Personal history of irradiation: Secondary | ICD-10-CM | POA: Diagnosis not present

## 2020-10-29 DIAGNOSIS — S2231XD Fracture of one rib, right side, subsequent encounter for fracture with routine healing: Secondary | ICD-10-CM | POA: Diagnosis not present

## 2020-10-29 DIAGNOSIS — Z853 Personal history of malignant neoplasm of breast: Secondary | ICD-10-CM | POA: Diagnosis not present

## 2020-10-29 DIAGNOSIS — I110 Hypertensive heart disease with heart failure: Secondary | ICD-10-CM | POA: Diagnosis present

## 2020-10-29 DIAGNOSIS — Z79811 Long term (current) use of aromatase inhibitors: Secondary | ICD-10-CM | POA: Diagnosis not present

## 2020-10-29 DIAGNOSIS — I5042 Chronic combined systolic (congestive) and diastolic (congestive) heart failure: Secondary | ICD-10-CM

## 2020-10-29 DIAGNOSIS — I429 Cardiomyopathy, unspecified: Secondary | ICD-10-CM | POA: Diagnosis present

## 2020-10-29 DIAGNOSIS — E785 Hyperlipidemia, unspecified: Secondary | ICD-10-CM | POA: Diagnosis present

## 2020-10-29 DIAGNOSIS — Z803 Family history of malignant neoplasm of breast: Secondary | ICD-10-CM | POA: Diagnosis not present

## 2020-10-29 DIAGNOSIS — Z91040 Latex allergy status: Secondary | ICD-10-CM | POA: Diagnosis not present

## 2020-10-29 DIAGNOSIS — I2510 Atherosclerotic heart disease of native coronary artery without angina pectoris: Secondary | ICD-10-CM | POA: Diagnosis present

## 2020-10-29 DIAGNOSIS — Z79899 Other long term (current) drug therapy: Secondary | ICD-10-CM | POA: Diagnosis not present

## 2020-10-29 DIAGNOSIS — Z880 Allergy status to penicillin: Secondary | ICD-10-CM | POA: Diagnosis not present

## 2020-10-29 DIAGNOSIS — J918 Pleural effusion in other conditions classified elsewhere: Secondary | ICD-10-CM

## 2020-10-29 DIAGNOSIS — R911 Solitary pulmonary nodule: Secondary | ICD-10-CM

## 2020-10-29 DIAGNOSIS — Z888 Allergy status to other drugs, medicaments and biological substances status: Secondary | ICD-10-CM | POA: Diagnosis not present

## 2020-10-29 DIAGNOSIS — D539 Nutritional anemia, unspecified: Secondary | ICD-10-CM | POA: Diagnosis present

## 2020-10-29 DIAGNOSIS — K7460 Unspecified cirrhosis of liver: Secondary | ICD-10-CM | POA: Diagnosis present

## 2020-10-29 DIAGNOSIS — S32019D Unspecified fracture of first lumbar vertebra, subsequent encounter for fracture with routine healing: Secondary | ICD-10-CM | POA: Diagnosis not present

## 2020-10-29 DIAGNOSIS — J189 Pneumonia, unspecified organism: Secondary | ICD-10-CM | POA: Diagnosis present

## 2020-10-29 DIAGNOSIS — F1020 Alcohol dependence, uncomplicated: Secondary | ICD-10-CM | POA: Diagnosis present

## 2020-10-29 DIAGNOSIS — Z20822 Contact with and (suspected) exposure to covid-19: Secondary | ICD-10-CM | POA: Diagnosis present

## 2020-10-29 DIAGNOSIS — Z95810 Presence of automatic (implantable) cardiac defibrillator: Secondary | ICD-10-CM | POA: Diagnosis not present

## 2020-10-29 DIAGNOSIS — Z7982 Long term (current) use of aspirin: Secondary | ICD-10-CM | POA: Diagnosis not present

## 2020-10-29 DIAGNOSIS — E876 Hypokalemia: Secondary | ICD-10-CM | POA: Diagnosis present

## 2020-10-29 LAB — URINALYSIS, ROUTINE W REFLEX MICROSCOPIC
Bilirubin Urine: NEGATIVE
Glucose, UA: NEGATIVE mg/dL
Hgb urine dipstick: NEGATIVE
Ketones, ur: NEGATIVE mg/dL
Leukocytes,Ua: NEGATIVE
Nitrite: NEGATIVE
Protein, ur: NEGATIVE mg/dL
Specific Gravity, Urine: 1.02 (ref 1.005–1.030)
pH: 8 (ref 5.0–8.0)

## 2020-10-29 LAB — COMPREHENSIVE METABOLIC PANEL
ALT: 17 U/L (ref 0–44)
AST: 31 U/L (ref 15–41)
Albumin: 2.5 g/dL — ABNORMAL LOW (ref 3.5–5.0)
Alkaline Phosphatase: 66 U/L (ref 38–126)
Anion gap: 9 (ref 5–15)
BUN: 10 mg/dL (ref 8–23)
CO2: 19 mmol/L — ABNORMAL LOW (ref 22–32)
Calcium: 7.8 mg/dL — ABNORMAL LOW (ref 8.9–10.3)
Chloride: 111 mmol/L (ref 98–111)
Creatinine, Ser: 0.7 mg/dL (ref 0.44–1.00)
GFR, Estimated: >60 mL/min (ref >60)
Glucose, Bld: 109 mg/dL — ABNORMAL HIGH (ref 70–99)
Potassium: 3.5 mmol/L (ref 3.5–5.1)
Sodium: 139 mmol/L (ref 135–145)
Total Bilirubin: 0.7 mg/dL (ref 0.3–1.2)
Total Protein: 5.6 g/dL — ABNORMAL LOW (ref 6.5–8.1)

## 2020-10-29 LAB — CBC
HCT: 30 % — ABNORMAL LOW (ref 36.0–46.0)
Hemoglobin: 9.4 g/dL — ABNORMAL LOW (ref 12.0–15.0)
MCH: 35.5 pg — ABNORMAL HIGH (ref 26.0–34.0)
MCHC: 31.3 g/dL (ref 30.0–36.0)
MCV: 113.2 fL — ABNORMAL HIGH (ref 80.0–100.0)
Platelets: 258 10*3/uL (ref 150–400)
RBC: 2.65 MIL/uL — ABNORMAL LOW (ref 3.87–5.11)
RDW: 13.7 % (ref 11.5–15.5)
WBC: 5 10*3/uL (ref 4.0–10.5)
nRBC: 0 % (ref 0.0–0.2)

## 2020-10-29 LAB — RESP PANEL BY RT-PCR (FLU A&B, COVID) ARPGX2
Influenza A by PCR: NEGATIVE
Influenza B by PCR: NEGATIVE
SARS Coronavirus 2 by RT PCR: NEGATIVE

## 2020-10-29 LAB — BRAIN NATRIURETIC PEPTIDE: B Natriuretic Peptide: 554.9 pg/mL — ABNORMAL HIGH (ref 0.0–100.0)

## 2020-10-29 LAB — PROCALCITONIN: Procalcitonin: <0.1 ng/mL

## 2020-10-29 MED ORDER — SODIUM CHLORIDE 0.9 % IV SOLN
100.0000 mg | Freq: Two times a day (BID) | INTRAVENOUS | Status: DC
  Administered 2020-10-29: 100 mg via INTRAVENOUS
  Filled 2020-10-29: qty 100

## 2020-10-29 MED ORDER — SODIUM CHLORIDE 0.9 % IV SOLN
2.0000 g | Freq: Once | INTRAVENOUS | Status: AC
  Administered 2020-10-29: 2 g via INTRAVENOUS
  Filled 2020-10-29: qty 20

## 2020-10-29 MED ORDER — PANTOPRAZOLE SODIUM 40 MG PO TBEC
40.0000 mg | DELAYED_RELEASE_TABLET | Freq: Two times a day (BID) | ORAL | Status: DC
  Administered 2020-10-29 – 2020-10-30 (×3): 40 mg via ORAL
  Filled 2020-10-29 (×3): qty 1

## 2020-10-29 MED ORDER — ASPIRIN EC 81 MG PO TBEC
81.0000 mg | DELAYED_RELEASE_TABLET | Freq: Every day | ORAL | Status: DC
  Administered 2020-10-29 – 2020-10-30 (×2): 81 mg via ORAL
  Filled 2020-10-29 (×2): qty 1

## 2020-10-29 MED ORDER — CARVEDILOL 6.25 MG PO TABS
6.2500 mg | ORAL_TABLET | Freq: Two times a day (BID) | ORAL | Status: DC
  Administered 2020-10-29 – 2020-10-30 (×3): 6.25 mg via ORAL
  Filled 2020-10-29: qty 1
  Filled 2020-10-29: qty 2
  Filled 2020-10-29: qty 1

## 2020-10-29 MED ORDER — SODIUM CHLORIDE 0.9 % IV SOLN
500.0000 mg | Freq: Once | INTRAVENOUS | Status: DC
  Filled 2020-10-29: qty 500

## 2020-10-29 MED ORDER — SODIUM CHLORIDE 0.9 % IV SOLN
2.0000 g | INTRAVENOUS | Status: DC
  Administered 2020-10-30: 2 g via INTRAVENOUS
  Filled 2020-10-29: qty 2
  Filled 2020-10-29: qty 20

## 2020-10-29 MED ORDER — ACETAMINOPHEN 500 MG PO TABS
1000.0000 mg | ORAL_TABLET | Freq: Once | ORAL | Status: AC
  Administered 2020-10-29: 1000 mg via ORAL
  Filled 2020-10-29: qty 2

## 2020-10-29 MED ORDER — SODIUM CHLORIDE 0.9 % IV BOLUS
500.0000 mL | Freq: Once | INTRAVENOUS | Status: AC
  Administered 2020-10-29: 500 mL via INTRAVENOUS

## 2020-10-29 MED ORDER — MAGNESIUM SULFATE IN D5W 1-5 GM/100ML-% IV SOLN
1.0000 g | Freq: Once | INTRAVENOUS | Status: AC
  Administered 2020-10-29: 1 g via INTRAVENOUS
  Filled 2020-10-29: qty 100

## 2020-10-29 MED ORDER — ACETAMINOPHEN 650 MG RE SUPP: 650.0000 mg | Freq: Four times a day (QID) | RECTAL | Status: DC | PRN

## 2020-10-29 MED ORDER — POTASSIUM CHLORIDE CRYS ER 20 MEQ PO TBCR
40.0000 meq | EXTENDED_RELEASE_TABLET | Freq: Once | ORAL | Status: AC
  Administered 2020-10-29: 40 meq via ORAL
  Filled 2020-10-29: qty 2

## 2020-10-29 MED ORDER — ACETAMINOPHEN 325 MG PO TABS
650.0000 mg | ORAL_TABLET | Freq: Four times a day (QID) | ORAL | Status: DC | PRN
Start: 2020-10-29 — End: 2020-10-30

## 2020-10-29 MED ORDER — METRONIDAZOLE IN NACL 5-0.79 MG/ML-% IV SOLN
500.0000 mg | Freq: Three times a day (TID) | INTRAVENOUS | Status: DC
  Administered 2020-10-29 – 2020-10-30 (×4): 500 mg via INTRAVENOUS
  Filled 2020-10-29 (×4): qty 100

## 2020-10-29 MED ORDER — SENNOSIDES-DOCUSATE SODIUM 8.6-50 MG PO TABS: 1.0000 | ORAL_TABLET | Freq: Every evening | ORAL | Status: DC | PRN

## 2020-10-29 MED ORDER — ENOXAPARIN SODIUM 40 MG/0.4ML ~~LOC~~ SOLN
40.0000 mg | Freq: Every day | SUBCUTANEOUS | Status: DC
  Administered 2020-10-29 – 2020-10-30 (×2): 40 mg via SUBCUTANEOUS
  Filled 2020-10-29 (×2): qty 0.4

## 2020-10-29 NOTE — Consult Note (Addendum)
NAME:  Dorothy Li, MRN:  [id number], DOB:  1946/02/21, LOS: 0 ADMISSION DATE:  10/28/2020, CONSULTATION DATE:  10/29/20 REFERRING MD:  Dorothy Li, CHIEF COMPLAINT:  Weakness   History of Present Illness:  Ms Dorothy Li is a 75 y.o.  F with PMH of CAD s/p CABG, systolic and diastolic HF, prior breast Ca s/p lumpectomy, tobacco abuse, ETOH abuse, recent falls with T12 fx and TLSO brace and loss of appetite, weight loss and fatigue who presented with L sided back pain and fatigue.   She was initially hypotensive and responded to IVF.  Labs did not show any leukocytosis, Procalcitonin <0.10 and pt is on room air.  CT chest showed trace pleural effusions, nonspecific left lower lobe opacity with superior segment cavitary lesion and abnormal left hilar lymph node so PCCM consulted   Pt is sitting up and feeling well, states that she smoking at a young age and quit in 2011, she denies any recent cough or fever, no nausea or vomiting  Pertinent  Medical History   has a past medical history of ARF (acute renal failure) ([place]), Arthritis, Breast cancer ([place]) (12/08/11), CAD (coronary artery disease), Cardiomyopathy, Carotid stenosis, ETOH abuse, HTN (hypertension), Hyperlipemia, ICD (implantable cardiac defibrillator) in place ([employer]), Nephrolithiasis, Rash, S/P radiation therapy (03/22/12 - 05/02/12), Stress fracture of right foot (01/17/12), Syncope, Use of anastrozole (Arimidex) (05/12/12), and VT (ventricular tachycardia) ([place]).   Significant Hospital Events: Including procedures, antibiotic start and stop dates in addition to other pertinent events   . 4/26 admit to hospitalists with pneumonia . 4/27 PCCM consult  Interim History / Subjective:  Feeling well on room air  Objective   Blood pressure (!) 142/84, pulse 99, temperature 99.4 F (37.4 C), temperature source Oral, resp. rate 18, SpO2 100 %.        Intake/Output Summary (Last 24 hours) at 10/29/2020 1314 Last data filed at 10/29/2020 2774 Gross  per 24 hour  Intake 847.56 ml  Output --  Net 847.56 ml   There were no vitals filed for this visit.  General:  Elderly F, sitting up, eating in no distress HEENT: MM pink/moist Neuro: awake, alert and oriented x4, moving all extremities CV: s1s2 rrr, no m/r/g PULM:  Clear bilaterally without hypoxia or room air and no accessory muscle use GI: soft, bsx4 active  Extremities: warm/dry, no edema  Skin: no rashes or lesions   Labs/imaging that I havepersonally reviewed  (right click and "Reselect all SmartList Selections" daily)   4/27 CT chest>>Increasing but nonspecific left lower lobe opacity. This could be worsening atelectasis or acute infection. Pulmonary contusion less likely. More solid appearance of the superior segment left lower lobe cavitary lesion, which is suspicious along with abnormal left hilar lymph node. These will require follow-up. Underlying Emphysema  CBC BMP Procal  Resolved Hospital Problem list     Assessment & Plan:   LLL cavitary lesion with abnormal L hilar lymph node -agree with admission and treatment of PNA with Rocephin and Flagyl  -will need outpatient follow up with Dr. Valeta Harms and PET scan before considering Bronchoscopy -thank you for this consult, please contact PCCM for any further acute pulmonary or critical care issues    Labs   CBC: Recent Labs  Lab 10/28/20 1932 10/29/20 1002  WBC 5.2 5.0  HGB 9.3* 9.4*  HCT 28.9* 30.0*  MCV 105.9* 113.2*  PLT 264 128    Basic Metabolic Panel: Recent Labs  Lab 10/28/20 1932 10/29/20 0618  NA 139 139  K 3.4* 3.5  CL 106 111  CO2 26 19*  GLUCOSE 93 109*  BUN 9 10  CREATININE 0.63 0.70  CALCIUM 8.4* 7.8*   GFR: Estimated Creatinine Clearance: 53.6 mL/min (by C-G formula based on SCr of 0.7 mg/dL). Recent Labs  Lab 10/28/20 1932 10/29/20 0618 10/29/20 1002  PROCALCITON  --  <0.10  --   WBC 5.2  --  5.0    Liver Function Tests: Recent Labs  Lab 10/29/20 0618  AST 31   ALT 17  ALKPHOS 66  BILITOT 0.7  PROT 5.6*  ALBUMIN 2.5*   No results for input(s): LIPASE, AMYLASE in the last 168 hours. No results for input(s): AMMONIA in the last 168 hours.  ABG    Component Value Date/Time   PHART 7.305 (L) 01/26/2011 1335   PCO2ART 25.7 (L) 01/26/2011 1335   PO2ART 96.0 01/26/2011 1335   HCO3 12.8 (L) 01/26/2011 1335   TCO2 14 01/26/2011 1335   ACIDBASEDEF 12.0 (H) 01/26/2011 1335   O2SAT 97.0 01/26/2011 1335     Coagulation Profile: No results for input(s): INR, PROTIME in the last 168 hours.  Cardiac Enzymes: No results for input(s): CKTOTAL, CKMB, CKMBINDEX, TROPONINI in the last 168 hours.  HbA1C: Hgb A1c MFr Bld  Date/Time Value Ref Range Status  10/14/2009 04:19 PM  4.6 - 6.1 % Final   6.1 (NOTE)                                                                       According to the ADA Clinical Practice Recommendations for 2011, when HbA1c is used as a screening test:   >=6.5%   Diagnostic of Diabetes Mellitus           (if abnormal result  is confirmed)  5.7-6.4%   Increased risk of developing Diabetes Mellitus  References:Diagnosis and Classification of Diabetes Mellitus,Diabetes NWGN,5621,30(QMVHQ 1):S62-S69 and Standards of Medical Care in         Diabetes - 2011,Diabetes IONG,2952,84  (Suppl 1):S11-S61.  09/24/2009 07:51 PM 4.9 4.6 - 6.1 % Final    Comment:    See lab report for associated comment(s)    CBG: No results for input(s): GLUCAP in the last 168 hours.  Review of Systems:   Review of Systems  Constitutional: Positive for chills, malaise/fatigue and weight loss. Negative for diaphoresis and fever.  Cardiovascular: Negative for chest pain, palpitations and PND.  Gastrointestinal: Negative for abdominal pain.     Past Medical History:  She,  has a past medical history of ARF (acute renal failure) ([place]), Arthritis, Breast cancer ([place]) (12/08/11), CAD (coronary artery disease), Cardiomyopathy, Carotid stenosis, ETOH abuse,  HTN (hypertension), Hyperlipemia, ICD (implantable cardiac defibrillator) in place ([employer]), Nephrolithiasis, Rash, S/P radiation therapy (03/22/12 - 05/02/12), Stress fracture of right foot (01/17/12), Syncope, Use of anastrozole (Arimidex) (05/12/12), and VT (ventricular tachycardia) ([place]).   Surgical History:   Past Surgical History:  Procedure Laterality Date  . ABDOMINAL HYSTERECTOMY     TAH-BSO, fibroids, age 89  . BREAST BIOPSY  12/08/11   Right Breast, Upper Outer Quadrant: Invasive Ductal:DCIS  . BREAST LUMPECTOMY  01-19-12   right lumpectomy/  . CARDIAC CATHETERIZATION    . COLONOSCOPY  04/11/2012   Procedure: COLONOSCOPY;  Surgeon: Jerene Bears, MD;  Location: [facility];  Service: Gastroenterology;  Laterality: N/A;  . CORONARY ARTERY BYPASS GRAFT  4/11    x 2  . ICD implantation  2011   ICD-Medtronic    Remote - No /  Hx of VT, LU chest     Social History:   reports that she quit smoking about 11 years ago. Her smoking use included cigarettes. She has a 40.00 pack-year smoking history. She has never used smokeless tobacco. She reports current alcohol use. She reports that she does not use drugs.   Family History:  Her family history includes Breast cancer in her cousin; Diabetes in her mother; Heart disease in her sister; Heart failure in her sister. There is no history of Colon cancer.   Allergies Allergies  Allergen Reactions  . Penicillins Other (See Comments)    "blacks out" per pt   . Latex Hives    gloves  . Tape Itching and Rash    Bandage on breast from bx caused rash     Home Medications  Prior to Admission medications   Medication Sig Start Date End Date Taking? Authorizing Provider  acetaminophen (TYLENOL) 325 MG tablet Take 650 mg by mouth every 6 (six) hours as needed for moderate pain or fever.   Yes [provider]  alendronate (FOSAMAX) 70 MG tablet Take 70 mg by mouth every Monday. Take with a full glass of water on an empty stomach.    Yes [provider]  aspirin EC 81 MG tablet Take 1 tablet (81 mg total) by mouth daily. 04/08/11  Yes Larey Dresser, MD  sertraline (ZOLOFT) 100 MG tablet Take 100 mg by mouth daily.   Yes [provider]  traZODone (DESYREL) 50 MG tablet Take 50 mg by mouth at bedtime. 06/02/15  Yes [provider]  amoxicillin (AMOXIL) 500 MG capsule Take 1,000 mg by mouth See admin instructions. 2 caps bid x 14 days    [provider]  anastrozole (ARIMIDEX) 1 MG tablet Take 1 tablet (1 mg total) by mouth daily. 07/27/13   Marcy Panning, MD  atorvastatin (LIPITOR) 80 MG tablet Take 40 mg by mouth at bedtime. Patient not taking: Reported on 10/29/2020    [provider]  Calcium-Vitamin D-Vitamin K 750-500-40 MG-UNT-MCG TABS Take 1 tablet by mouth daily.    [provider]  carvedilol (COREG) 6.25 MG tablet Take 6.25 mg by mouth 2 (two) times daily with a meal.    [provider]  Cholecalciferol (VITAMIN D3) 3000 UNITS TABS Take 2,000 Units by mouth. Taking Vitamin D3 2000 units once daily    [provider]  enalapril (VASOTEC) 2.5 MG tablet Take 1 tablet (2.5 mg total) by mouth 2 (two) times daily. Patient taking differently: Take 2.5 mg by mouth at bedtime. 08/29/13   Larey Dresser, MD  erythromycin base (E-MYCIN) 500 MG tablet Take 500 mg by mouth See admin instructions. Bid x 14 days Patient not taking: Reported on 10/29/2020    [provider]  hydrALAZINE (APRESOLINE) 50 MG tablet Take 25 mg by mouth 3 (three) times daily. Patient not taking: Reported on 10/29/2020    [provider]  LACTOBACILLUS PROBIOTIC PO Take 1 capsule by mouth daily.    [provider]  metroNIDAZOLE (FLAGYL) 250 MG tablet Take 500 mg by mouth See admin instructions. 2 tabs bid x 14 days    [provider]  Multiple Vitamins-Minerals (MULTIVITAMIN WITH MINERALS) tablet  Take 1 tablet by mouth daily.    [provider]  pantoprazole (PROTONIX) 40 MG tablet Take 40 mg by mouth 2 (two) times daily before a meal.    [provider]  vitamin B-12 (CYANOCOBALAMIN) 1000 MCG tablet Take 1,000 mcg by mouth every other day.    [provider]         Otilio Carpen Gleason, PA-C [facility] See Amion for pager If no response to pager , please call [phone] until 7pm After 7:00 pm call Elink  [phone]   Pulmonary critical care attending:  This is a 75 year old female, former smoker quit in 2011 smoked since a teenager.  Has a past medical history of breast cancer status post resection, no chemo, no XRT.,  History of CAD, status post CABG, history of chronic systolic and diastolic heart failure.  Patient also has a history of tobacco abuse, alcohol abuse.  Patient was not feeling well come to the ER for evaluation.  No leukocytosis, procalcitonin less than 0.1.  CT scan of the chest with small trace effusion.  Also found to have a left lower lobe opacity with a small area in the superior portion cavitary looking nodule and enlarged left hilar node.  History is reviewed  BP (!) 140/106 (BP Location: Left Arm)   Pulse (!) 110   Temp 98.6 F (37 C)   Resp 18   SpO2 97%   General: Elderly female resting in bed HEENT: Mucous membranes moist Extremities: No significant edema Heart: Regular rhythm S1-S2 none lungs: Clear to auscultation bilaterally  Labs: Reviewed  CT chest 10/28/2020: Left lower lobe superior segment small cavitary nodule, very small pleural effusion, atelectasis, left hilar lymph node enlargement. I am on sure if we are dealing with a infectious etiology however I think this is less likely with a negative procalcitonin and no significant white count. I think images represent potentially a malignancy. The patient's images have been independently reviewed by me.    A: Small left cavitary nodule Left hilar adenopathy Former tobacco  use  Plan: Complete course of antibiotics. We will have her set up for an outpatient PET scan. I am unsure if this nodule is inflammatory versus malignant.  She does have a full hilum on CT however its a CT without contrast some unsure if there is a enlarged nodal structure there. PET scan will be best to further evaluate and then we can make a decision outpatient whether or not to consider bronchoscopy for sampling of the nodule as well as the hilar lymph node.  I discussed this today with the patient.  She is agreeable to this plan.  I will arrange outpatient follow-up with me in clinic as well as the nuclear medicine pet image to be complete before seeing me.  Garner Nash, DO [facility] 10/29/2020 5:09 PM

## 2020-10-29 NOTE — H&P (Signed)
History and Physical    Dorothy Li IOE:[id number] DOB: [birth year] DOA: 10/28/2020  PCP: [facility]   Patient coming from: Home   Chief Complaint: Fatigue, loss of appetite   HPI: Dorothy Li is a 75 y.o. 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 pylori with quadruple therapy, liver lesion, recent fall with T12 fracture managed with TLSO brace, recently identified left lower lung cavitary lesion, now presenting to the emergency department with loss of appetite and fatigue.  Patient reports that she has not had any alcohol since prior to the recent admission, has not did experience any subjective fevers or chills, but has had progressive fatigue and loss of appetite.  She denies shortness of breath while at rest but has exertional dyspnea.  She has some pleuritic chest discomfort.  She has not been coughing much.  ED Course: Upon arrival to the ED, patient is found to be afebrile, saturating low 90s on room air, mildly tachypneic, tachycardic to 120, and with stable blood pressure.  EKG features sinus tachycardia with rate 115 and QTc interval of 591 ms.  Chest x-ray demonstrates progression of left basilar infiltrate and development of left suprahilar opacity as well as interval development of small parapneumonic effusion since the CT 2 weeks ago.  Chemistry panel notable for mild hypokalemia and CBC features a stable macrocytic anemia.  Patient was given 1.5 L of saline, acetaminophen, and antibiotics in the ED.  Review of Systems:  All other systems reviewed and apart from HPI, are negative.  Past Medical History:  Diagnosis Date  . ARF (acute renal failure) ([facility])    in setting dehydration with ACE I use   . Arthritis   . 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 CAD (coronary artery disease)    LHC (4/11) showed 75% ostial LM stenosis with significant damping upon catheter engagement, 60-70% mLAD, 60% ostial D1, EF 30%.  Patient had CABG with LIMA-LAD and SVG-OM2.   . Cardiomyopathy    Echo (4/11) with EF 25%, diffuse hypokinesis, moderate LV dilation, decreased RV  systolic function but normal RV size, PA systolic pressure 35 mmHg. Lexiscan myoview (4/11): EF 29%,  diffuse hypokinesis, basal anteroseptal and apical scar, no ischemia.  The cardiomyopathy is likely due to a combination of CAD (LM stenosis) and ETOH abuse.   . Carotid stenosis    carotid dopplers (1/82) with 99-37% LICA stenosis.  Carotid dopplers (16/96): 78-93%  LICA stenosis.  [name] ETOH abuse     Elevated AST, mildly elevated INR (? component of cirrhosis). Has now quit ETOH.   . HTN (hypertension)   . Hyperlipemia   . 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 Nephrolithiasis   . Rash    from tape   . S/P radiation therapy 03/22/12 - 05/02/12   Right Breast: 50 gray/25Fractions with Boost of 10gray/5 Fractions  . Stress fracture of right foot 01/17/12   Dr. Vangie Bicker  . Syncope    none since ICD placement  . Use of anastrozole (Arimidex) 05/12/12  . VT (ventricular tachycardia) ([facility])    Dual chamber Medtronic ICD placed 7/11    Past Surgical History:  Procedure Laterality Date  . ABDOMINAL HYSTERECTOMY     TAH-BSO, fibroids, age 86  .  BREAST BIOPSY  12/08/11   Right Breast, Upper Outer Quadrant: Invasive Ductal:DCIS  . BREAST LUMPECTOMY  01-19-12   right lumpectomy/  . CARDIAC CATHETERIZATION    . COLONOSCOPY  04/11/2012   Procedure: COLONOSCOPY;  Surgeon: Jerene Bears, MD;  Location: [facility];  Service: Gastroenterology;  Laterality: N/A;  . CORONARY ARTERY BYPASS GRAFT  4/11    x 2  . ICD implantation  2011   ICD-Medtronic    Remote - No /  Hx of VT, LU chest    Social History:   reports that she quit smoking about 11  years ago. Her smoking use included cigarettes. She has a 40.00 pack-year smoking history. She has never used smokeless tobacco. She reports current alcohol use. She reports that she does not use drugs.  Allergies  Allergen Reactions  . Penicillins Other (See Comments)    "blacks out" per pt   . Latex Hives    gloves  . Tape Itching and Rash    Bandage on breast from bx caused rash    Family History  Problem Relation Age of Onset  . Heart disease Sister        Had surgery  . Heart failure Sister   . Diabetes Mother   . Breast cancer Cousin   . Colon cancer Neg Hx      Prior to Admission medications   Medication Sig Start Date End Date Taking? Authorizing Provider  acetaminophen (TYLENOL) 325 MG tablet Take 650 mg by mouth every 6 (six) hours as needed for moderate pain or fever.    [provider]  alendronate (FOSAMAX) 70 MG tablet Take 70 mg by mouth every Monday. Take with a full glass of water on an empty stomach.    [provider]  amoxicillin (AMOXIL) 500 MG capsule Take 1,000 mg by mouth See admin instructions. 2 caps bid x 14 days    [provider]  anastrozole (ARIMIDEX) 1 MG tablet Take 1 tablet (1 mg total) by mouth daily. 07/27/13   Marcy Panning, MD  aspirin EC 81 MG tablet Take 1 tablet (81 mg total) by mouth daily. 04/08/11   Larey Dresser, MD  atorvastatin (LIPITOR) 80 MG tablet Take 40 mg by mouth at bedtime.    [provider]  Calcium-Vitamin D-Vitamin K 144-315-40 MG-UNT-MCG TABS Take 1 tablet by mouth daily.    [provider]  carvedilol (COREG) 6.25 MG tablet Take 6.25 mg by mouth 2 (two) times daily with a meal.    [provider]  Cholecalciferol (VITAMIN D3) 3000 UNITS TABS Take 2,000 Units by mouth. Taking Vitamin D3 2000 units once daily    [provider]  enalapril (VASOTEC) 2.5 MG tablet Take 1 tablet (2.5 mg total) by mouth 2 (two) times daily. Patient taking differently: Take 2.5 mg  by mouth at bedtime. 08/29/13   Larey Dresser, MD  erythromycin base (E-MYCIN) 500 MG tablet Take 500 mg by mouth See admin instructions. Bid x 14 days    [provider]  hydrALAZINE (APRESOLINE) 50 MG tablet Take 25 mg by mouth 3 (three) times daily. Patient not taking: No sig reported    [provider]  LACTOBACILLUS PROBIOTIC PO Take 1 capsule by mouth daily.    [provider]  metroNIDAZOLE (FLAGYL) 250 MG tablet Take 500 mg by mouth See admin instructions. 2 tabs bid x 14 days    [provider]  Multiple Vitamins-Minerals (MULTIVITAMIN WITH MINERALS) tablet Take  1 tablet by mouth daily.    [provider]  pantoprazole (PROTONIX) 40 MG tablet Take 40 mg by mouth 2 (two) times daily before a meal.    [provider]  sertraline (ZOLOFT) 100 MG tablet Take 100 mg by mouth daily.    [provider]  traZODone (DESYREL) 50 MG tablet Take 50 mg by mouth at bedtime. 06/02/15   [provider]  vitamin B-12 (CYANOCOBALAMIN) 1000 MCG tablet Take 1,000 mcg by mouth every other day.    [provider]    Physical Exam: Vitals:   10/29/20 0200 10/29/20 0300 10/29/20 0315 10/29/20 0415  BP: 122/70 120/78 131/73 126/78  Pulse: (!) 117 (!) 114 (!) 118 (!) 120  Resp: (!) 26 (!) 21 (!) 22 (!) 22  Temp:      TempSrc:      SpO2: 96% 98% 97% 97%    Constitutional: NAD, calm  Eyes: PERTLA, lids and conjunctivae normal ENMT: Mucous membranes are moist. Posterior pharynx clear of any exudate or lesions.   Neck:  supple, no masses  Respiratory: mild tachypnea, no wheezing. No pallor or cyanosis.  Cardiovascular: Rate ~120 and regular. No extremity edema.  Abdomen: No distension, no tenderness, soft. Bowel sounds active.  Musculoskeletal: no clubbing / cyanosis. No joint deformity upper and lower extremities.   Skin: no significant rashes, lesions, ulcers. Warm, dry, well-perfused. Neurologic: CN 2-12 grossly intact.  Sensation intact. Moving all extremities.  Psychiatric: Alert and oriented to person, place, and situation. Pleasant and cooperative.    Labs and Imaging on Admission: I have personally reviewed following labs and imaging studies  CBC: Recent Labs  Lab 10/28/20 1932  WBC 5.2  HGB 9.3*  HCT 28.9*  MCV 105.9*  PLT 128   Basic Metabolic Panel: Recent Labs  Lab 10/28/20 1932  NA 139  K 3.4*  CL 106  CO2 26  GLUCOSE 93  BUN 9  CREATININE 0.63  CALCIUM 8.4*   GFR: Estimated Creatinine Clearance: 53.6 mL/min (by C-G formula based on SCr of 0.63 mg/dL). Liver Function Tests: No results for input(s): AST, ALT, ALKPHOS, BILITOT, PROT, ALBUMIN in the last 168 hours. No results for input(s): LIPASE, AMYLASE in the last 168 hours. No results for input(s): AMMONIA in the last 168 hours. Coagulation Profile: No results for input(s): INR, PROTIME in the last 168 hours. Cardiac Enzymes: No results for input(s): CKTOTAL, CKMB, CKMBINDEX, TROPONINI in the last 168 hours. BNP (last 3 results) No results for input(s): PROBNP in the last 8760 hours. HbA1C: No results for input(s): HGBA1C in the last 72 hours. CBG: No results for input(s): GLUCAP in the last 168 hours. Lipid Profile: No results for input(s): CHOL, HDL, LDLCALC, TRIG, CHOLHDL, LDLDIRECT in the last 72 hours. Thyroid Function Tests: No results for input(s): TSH, T4TOTAL, FREET4, T3FREE, THYROIDAB in the last 72 hours. Anemia Panel: No results for input(s): VITAMINB12, FOLATE, FERRITIN, TIBC, IRON, RETICCTPCT in the last 72 hours. Urine analysis:    Component Value Date/Time   COLORURINE AMBER (A) 10/29/2020 0210   APPEARANCEUR HAZY (A) 10/29/2020 0210   LABSPEC 1.020 10/29/2020 0210   PHURINE 8.0 10/29/2020 0210   GLUCOSEU NEGATIVE 10/29/2020 0210   HGBUR NEGATIVE 10/29/2020 0210   BILIRUBINUR NEGATIVE 10/29/2020 0210   KETONESUR NEGATIVE 10/29/2020 0210   PROTEINUR NEGATIVE 10/29/2020 0210   UROBILINOGEN 0.2  01/12/2012 0842   NITRITE NEGATIVE 10/29/2020 0210   LEUKOCYTESUR NEGATIVE 10/29/2020 0210   Sepsis Labs: @LABRCNTIP (procalcitonin:4,lacticidven:4) )No results found  for this or any previous visit (from the past 240 hour(s)).   Radiological Exams on Admission: DG Chest Port 1 View  Result Date: 10/29/2020 CLINICAL DATA:  Tachycardia EXAM: PORTABLE CHEST 1 VIEW COMPARISON:  10/15/2020 FINDINGS: Progressive left basilar atelectasis or infiltrate. Small left pleural effusion has developed. Left suprahilar opacity has developed, new since prior examination, possibly infectious or inflammatory in nature. No pneumothorax. No pleural effusion on the right. Coronary artery bypass grafting has been performed. Cardiac size is within normal limits. Left subclavian dual lead pacemaker defibrillator is unchanged. No acute bone abnormality. IMPRESSION: Progressive left basilar infiltrate and development of a left suprahilar opacity, new since CT examination 10/15/2020, suspicious for an acute infectious process in the appropriate clinical setting. Interval development of small left parapneumonic effusion. Electronically Signed   By: Fidela Salisbury MD   On: 10/29/2020 03:37    EKG: Independently reviewed. Sinus tachycardia, rate 115, QTc 591 ms.   Assessment/Plan   1. Pneumonia  - Presents with loss of appetite, fatigue, and DOE and is noted to have progression of left lung infiltrates with parapneumonic effusion, new since CT from 4/13 when cavitary lung lesion was noted   - Check strep pneumo and legionella antigens, culture sputum, start Rocphin and Flagyl, trend procalcitonin and clinical course  2. Chronic combined systolic and diastolic CHF  - EF was 16-94% with grade 1 diastolic dysfunction in [facility] 2022  - Appears compensated, continue Coreg and monitor volume status    3. CAD - No anginal complaints  - Continue ASA and beta-blocker   4. Prolonged QT interval  - QTc 591 ms on admission  -  Continue cardiac monitoring, minimize QT-prolonging medications, replace potassium, supplement magnesium as needed, repeat EKG     DVT prophylaxis: Lovenox  Code Status: Full  Level of Care: Level of care: Telemetry Medical Family Communication: None present  Disposition Plan:  Patient is from: home  Anticipated d/c is to: Home  Anticipated d/c date is: 11/01/20 Patient currently: Pending treatment of pulmonary infection  Consults called: None  Admission status: inpatient     Vianne Bulls, MD [facility]  10/29/2020, 6:17 AM

## 2020-10-29 NOTE — ED Notes (Signed)
Report given to Floyce Stakes, RN of [phone]

## 2020-10-29 NOTE — Telephone Encounter (Signed)
PCCM:  Patient currently admitted to the hospital.  Needs outpatient follow-up with me in clinic.  Please ensure nuclear medicine pet images complete prior to office visit with me.  Can see me within the next 2 to 4 weeks post discharge.  Okay to use lung nodule slot  Garner Nash, DO [facility] 10/29/2020 5:19 PM

## 2020-10-29 NOTE — Progress Notes (Signed)
Lesion withPatient seen and examined personally, I reviewed the chart, history and physical and admission note, done by admitting physician this morning and agree with the same with following addendum.  Please refer to the morning admission note for more detailed plan of care.  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 pylori , liver lesion, recent fall with T12 fracture managed with TLSO brace, recently identified left lower lung cavitary lesion presented to the ED with loss of appetite, fatigue, weight loss.  As per the report she has not had any alcohol since prior to the recent admission, has not did experience any subjective fevers or chills, but has had progressive fatigue and loss of appetite.  She denies shortness of breath while at rest but has exertional dyspnea.  She has some pleuritic chest discomfort.  She has not been coughing much.  In the ED afebrile saturating low 90s on room air tachycardic tachypneic with stable BP, EKG sinus tachycardia QTC 591, chest x-ray left basilar infiltrate and development of left suprahilar opacity as well as interval development of a small parapneumonic effusion since her CT 2 weeks ago lab with hypokalemia anemia.  Patient was given 1.5 L normal saline antibiotic and admitted for further management  Seen and examined this morning She c/o pain on post chest left > rt. On RA. No new complaints endorses wt loss 180> 108 lb from poor appetite. past smoker 30-40 year smoking hx 2 ppd. No hemoptysis, hemotochezia or melena. Lung diminished b/l, aaox2, frail  Pneumonia:Cont antibiotics-ceftriaxone and Flagyl, get CT chest, possible PCCM evaluation , f/u culture, urine antigens.  Check procalcitonin, currently afebrile no leukocytosis. Recent Labs  Lab 10/28/20 1932  WBC 5.2   More  solid-appearing superior segment left lower lobe cavitary lesion which is suspicious with abnormal left hilar lymph node: will c/w PCCM, given patient's recent TTL L1 compression fractures, recent weight loss heavy smoking history.  Hepatic lobe lesion on-Recent CT  Mild hyperkalemia follow-up labs.  Chronic combined systolic diastolic CHF with EF 30 to 35% grade 1 DD in April 2022.  Euvolemic compensated but tachycardic.  Continue Coreg monitor volume status.  Sinus tachycardia - tachycardic more with movement.  Has not received her Coreg and just received in the ED.  Monitor. HR is better after am coreg.  CAD with history of CABG: No chest pain or anginal symptoms.  Continue ASA and Coreg. AICD in place-being interrogated.  Prolonged QTC 591 monitor.  Minimize QT prolonging medication.  Anemia hemoglobin at 9.3 g, about the baseline, macrocytic.  Likely  2/2 previous alcohol abuse coronary disease Recent Labs  Lab 10/28/20 1932  HGB 9.3*  HCT 28.9*   Weight loss attributes to low appetite.  Dietitian consulted.  Stable T12 and L1 compression fractures, healing anterior left seventh rib and right posterior 11th rib fracture Recent T12 burst fracture-seen by neurosurgery 4/14 had advised mobilization w/ TLSO and outpatient follow-up in 4 weeks with repeat radiograph.

## 2020-10-29 NOTE — ED Provider Notes (Signed)
[facility] Emergency Department Provider Note MRN:  [id number]  Arrival date & time: 10/29/20     Chief Complaint   Tachycardia   History of Present Illness   Dorothy Li is a 75 y.o. year-old female with a history of CAD, alcohol abuse, hypertension, ICD presenting to the ED with chief complaint of tachycardia.  Patient endorsing total body weakness.  Endorsing continued right flank pain related to recent rib fracture.  Recent admission for dehydration.  Here because at home has been having persistent abnormal vital signs, heart rate over 100, blood pressure in the 01V to 90 systolic.  [facility] doctor advised evaluation in the emergency department.  Patient endorsing diarrhea for several weeks.  2 watery bowel movements per day.  Currently on antibiotics.  Review of Systems  A complete 10 system review of systems was obtained and all systems are negative except as noted in the HPI and PMH.   Patient's Health History    Past Medical History:  Diagnosis Date  . ARF (acute renal failure) ([place])    in setting dehydration with ACE I use   . Arthritis   . 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 CAD (coronary artery disease)    LHC (4/11) showed 75% ostial LM stenosis with significant damping upon catheter engagement, 60-70% mLAD, 60% ostial D1, EF 30%.  Patient had CABG with LIMA-LAD and SVG-OM2.   . Cardiomyopathy    Echo (4/11) with EF 25%, diffuse hypokinesis, moderate LV dilation, decreased RV  systolic function but normal RV size, PA systolic pressure 35 mmHg. Lexiscan myoview (4/11): EF 29%,  diffuse hypokinesis, basal anteroseptal and apical scar, no ischemia.  The cardiomyopathy is likely due to a combination of CAD (LM stenosis) and ETOH abuse.   . Carotid stenosis    carotid dopplers (4/94) with 49-67% LICA stenosis.  Carotid dopplers (59/16): 38-46%  LICA stenosis.  [name] ETOH abuse     Elevated AST, mildly  elevated INR (? component of cirrhosis). Has now quit ETOH.   . HTN (hypertension)   . Hyperlipemia   . 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 Nephrolithiasis   . Rash    from tape   . S/P radiation therapy 03/22/12 - 05/02/12   Right Breast: 50 gray/25Fractions with Boost of 10gray/5 Fractions  . Stress fracture of right foot 01/17/12   Dr. Vangie Bicker  . Syncope    none since ICD placement  . Use of anastrozole (Arimidex) 05/12/12  . VT (ventricular tachycardia) ([facility])    Dual chamber Medtronic ICD placed 7/11    Past Surgical History:  Procedure Laterality Date  . ABDOMINAL HYSTERECTOMY     TAH-BSO, fibroids, age 87  . BREAST BIOPSY  12/08/11   Right Breast, Upper Outer Quadrant: Invasive Ductal:DCIS  . BREAST LUMPECTOMY  01-19-12   right lumpectomy/  . CARDIAC CATHETERIZATION    . COLONOSCOPY  04/11/2012   Procedure: COLONOSCOPY;  Surgeon: Jerene Bears, MD;  Location: [facility] ENDOSCOPY;  Service: Gastroenterology;  Laterality: N/A;  . CORONARY ARTERY BYPASS GRAFT  4/11    x 2  . ICD implantation  2011   ICD-Medtronic    Remote - No /  Hx of VT, LU chest    Family History  Problem Relation Age of Onset  . Heart disease Sister        Had surgery  . Heart failure Sister   .  Diabetes Mother   . Breast cancer Cousin   . Colon cancer Neg Hx     Social History   Socioeconomic History  . Marital status: Married    Spouse name: Not on file  . Number of children: Not on file  . Years of education: Not on file  . Highest education level: Not on file  Occupational History  . Not on file  Tobacco Use  . Smoking status: Former Smoker    Packs/day: 1.00    Years: 40.00    Pack years: 40.00    Types: Cigarettes    Quit date: 10/08/2009    Years since quitting: 11.0  . Smokeless tobacco: Never Used  Substance and Sexual Activity  . Alcohol use: Yes    Comment: Prior hx 2 drinks daily.  quit 09/2009  . Drug use: No  . Sexual activity:  Not Currently    Comment: menses age [age], G3, 3 miscarriages,no HRT  Other Topics Concern  . 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 Married, lives in [place] smoking 4/11   Prior moderate ETOH use (2 liquor drinks daily) but quit in 3/11.    No drug use and negative urine drug screen in 2/11   Social Determinants of Health   Financial Resource Strain: Not on file  Food Insecurity: Not on file  Transportation Needs: Not on file  Physical Activity: Not on file  Stress: Not on file  Social Connections: Not on file  Intimate Partner Violence: Not on file     Physical Exam   Vitals:   10/29/20 0545 10/29/20 0645  BP: 131/74 129/72  Pulse: (!) 119 (!) 112  Resp: 18 (!) 23  Temp:    SpO2: 98% 100%    CONSTITUTIONAL: Chronically ill-appearing, NAD NEURO:  Alert and oriented x 3, no focal deficits EYES:  eyes equal and reactive ENT/NECK:  no LAD, no JVD CARDIO: Tachycardic rate, well-perfused, normal S1 and S2 PULM:  CTAB no wheezing or rhonchi GI/GU:  normal bowel sounds, non-distended, non-tender MSK/SPINE:  No gross deformities, no edema SKIN:  no rash, atraumatic PSYCH:  Appropriate speech and behavior  *Additional and/or pertinent findings included in MDM below  Diagnostic and Interventional Summary    EKG Interpretation  Date/Time:  Wednesday October 29 2020 02:30:29 EDT Ventricular Rate:  115 PR Interval:  111 QRS Duration: 97 QT Interval:  427 QTC Calculation: 591 R Axis:   21 Text Interpretation: Sinus tachycardia Atrial premature complexes Low voltage, precordial leads Nonspecific T abnrm, anterolateral leads Prolonged QT interval Confirmed by Gerlene Fee [phone]) on 10/29/2020 3:01:07 AM      Labs Reviewed  CBC - Abnormal; Notable for the following components:      Result Value   RBC 2.73 (*)    Hemoglobin 9.3 (*)    HCT 28.9 (*)    MCV 105.9 (*)    MCH 34.1 (*)    All other components within  normal limits  BASIC METABOLIC PANEL - Abnormal; Notable for the following components:   Potassium 3.4 (*)    Calcium 8.4 (*)    All other components within normal limits  URINALYSIS, ROUTINE W REFLEX MICROSCOPIC - Abnormal; Notable for the following components:   Color, Urine AMBER (*)    APPearance HAZY (*)    All other components within normal limits  RESP PANEL BY RT-PCR (FLU A&B, COVID) ARPGX2  EXPECTORATED SPUTUM ASSESSMENT Sid Falcon  STAIN, RFLX TO RESP C  LEGIONELLA PNEUMOPHILA SEROGP 1 UR AG  STREP PNEUMONIAE URINARY ANTIGEN  PROCALCITONIN    DG Chest Port 1 View  Final Result      Medications  carvedilol (COREG) tablet 6.25 mg (has no administration in time range)  pantoprazole (PROTONIX) EC tablet 40 mg (has no administration in time range)  aspirin EC tablet 81 mg (has no administration in time range)  cefTRIAXone (ROCEPHIN) 2 g in sodium chloride 0.9 % 100 mL IVPB (has no administration in time range)  metroNIDAZOLE (FLAGYL) IVPB 500 mg (has no administration in time range)  enoxaparin (LOVENOX) injection 40 mg (has no administration in time range)  acetaminophen (TYLENOL) tablet 650 mg (has no administration in time range)    Or  acetaminophen (TYLENOL) suppository 650 mg (has no administration in time range)  senna-docusate (Senokot-S) tablet 1 tablet (has no administration in time range)  potassium chloride SA (KLOR-CON) CR tablet 40 mEq (has no administration in time range)  magnesium sulfate IVPB 1 g 100 mL (has no administration in time range)  sodium chloride 0.9 % bolus 1,000 mL (0 mLs Intravenous Stopped 10/29/20 0137)  cefTRIAXone (ROCEPHIN) 2 g in sodium chloride 0.9 % 100 mL IVPB (0 g Intravenous Stopped 10/29/20 0442)  sodium chloride 0.9 % bolus 500 mL (500 mLs Intravenous New Bag/Given 10/29/20 0403)  acetaminophen (TYLENOL) tablet 1,000 mg (1,000 mg Oral Given 10/29/20 0410)     Procedures  /  Critical Care Procedures  ED Course and Medical Decision  Making  I have reviewed the triage vital signs, the nursing notes, and pertinent available records from the EMR.  Listed above are laboratory and imaging tests that I personally ordered, reviewed, and interpreted and then considered in my medical decision making (see below for details).  Generalized weakness, tachycardia with soft blood pressures at home.  Here in the emergency department heart rate 105, normotensive, oral temp 99.  Nontoxic-appearing, sitting comfortably doing the crossword.  Abdomen soft and nontender.  Suspect some hypovolemia possibly related to chronic diarrhea.  On antibiotics and had considered C. difficile infection patient is nontender, no white count, and this diarrhea has been chronic and she was tested during her recent hospitalization and tested negative.  Will provide IV fluids and reassess, suspect she can follow-up with her regular doctor to further discuss these symptoms.     Patient persistently tachycardic despite IV fluids, now in the 120s.  Screening chest x-ray reveals likely pneumonia.  Given this finding, will admit to medicine.  Barth Kirks. Sedonia Small, [place] [email or web address]  Final Clinical Impressions(s) / ED Diagnoses     ICD-10-CM   1. Community acquired pneumonia, unspecified laterality  J18.9     ED Discharge Orders    None       Discharge Instructions Discussed with and Provided to Patient:   Discharge Instructions   None       Maudie Flakes, MD 10/29/20 [phone]

## 2020-10-30 DIAGNOSIS — E43 Unspecified severe protein-calorie malnutrition: Secondary | ICD-10-CM | POA: Insufficient documentation

## 2020-10-30 DIAGNOSIS — E876 Hypokalemia: Secondary | ICD-10-CM

## 2020-10-30 LAB — BASIC METABOLIC PANEL
Anion gap: 6 (ref 5–15)
BUN: 7 mg/dL — ABNORMAL LOW (ref 8–23)
CO2: 25 mmol/L (ref 22–32)
Calcium: 8.5 mg/dL — ABNORMAL LOW (ref 8.9–10.3)
Chloride: 106 mmol/L (ref 98–111)
Creatinine, Ser: 0.7 mg/dL (ref 0.44–1.00)
GFR, Estimated: >60 mL/min (ref >60)
Glucose, Bld: 110 mg/dL — ABNORMAL HIGH (ref 70–99)
Potassium: 4 mmol/L (ref 3.5–5.1)
Sodium: 137 mmol/L (ref 135–145)

## 2020-10-30 LAB — CBC
HCT: 28.1 % — ABNORMAL LOW (ref 36.0–46.0)
Hemoglobin: 9.2 g/dL — ABNORMAL LOW (ref 12.0–15.0)
MCH: 34.6 pg — ABNORMAL HIGH (ref 26.0–34.0)
MCHC: 32.7 g/dL (ref 30.0–36.0)
MCV: 105.6 fL — ABNORMAL HIGH (ref 80.0–100.0)
Platelets: 254 10*3/uL (ref 150–400)
RBC: 2.66 MIL/uL — ABNORMAL LOW (ref 3.87–5.11)
RDW: 13.2 % (ref 11.5–15.5)
WBC: 4.5 10*3/uL (ref 4.0–10.5)
nRBC: 0 % (ref 0.0–0.2)

## 2020-10-30 LAB — HEPATIC FUNCTION PANEL
ALT: 17 U/L (ref 0–44)
AST: 24 U/L (ref 15–41)
Albumin: 2.4 g/dL — ABNORMAL LOW (ref 3.5–5.0)
Alkaline Phosphatase: 66 U/L (ref 38–126)
Bilirubin, Direct: 0.1 mg/dL (ref 0.0–0.2)
Indirect Bilirubin: 0.6 mg/dL (ref 0.3–0.9)
Total Bilirubin: 0.7 mg/dL (ref 0.3–1.2)
Total Protein: 5.4 g/dL — ABNORMAL LOW (ref 6.5–8.1)

## 2020-10-30 LAB — PROCALCITONIN: Procalcitonin: <0.1 ng/mL

## 2020-10-30 LAB — MAGNESIUM: Magnesium: 1.4 mg/dL — ABNORMAL LOW (ref 1.7–2.4)

## 2020-10-30 MED ORDER — ENSURE ENLIVE PO LIQD: 237.0000 mL | Freq: Three times a day (TID) | ORAL | 12 refills | Status: DC

## 2020-10-30 MED ORDER — ENSURE ENLIVE PO LIQD: 237.0000 mL | Freq: Three times a day (TID) | ORAL | Status: DC

## 2020-10-30 MED ORDER — ADULT MULTIVITAMIN W/MINERALS CH: 1.0000 | ORAL_TABLET | Freq: Every day | ORAL | Status: DC

## 2020-10-30 MED ORDER — MAGNESIUM SULFATE 2 GM/50ML IV SOLN
2.0000 g | Freq: Once | INTRAVENOUS | Status: AC
  Administered 2020-10-30: 2 g via INTRAVENOUS
  Filled 2020-10-30: qty 50

## 2020-10-30 MED ORDER — METRONIDAZOLE 500 MG PO TABS: 500.0000 mg | ORAL_TABLET | Freq: Three times a day (TID) | ORAL | Status: DC

## 2020-10-30 NOTE — Plan of Care (Signed)

## 2020-10-30 NOTE — Evaluation (Signed)
Physical Therapy Evaluation Patient Details Name: Dorothy Li MRN: [id number] DOB: 02/18/1946 Today's Date: 10/30/2020   History of Present Illness  Pt is a 75 y.o. female admitted 4/26 with left back pain with PNA and hypotension. Pt with recent admission after fall 4/13 with T12 and L1 fxs treated with TLSO. PMH consists of multivessel coronary artery disease, status post CABG, chronic congestive heart failure with reduced ejection fraction (EF 25%), history of VT, status post implanted cardioverter-defibrillator, history of breast cancer, hypertension, hyperlipidemia, and alcohol dependence.  Clinical Impression  Pt pleasant and moving well without report of pain or SOB. Pt with cues for precautions with functional mobility and brace donning. Pt reports she has been consistently using RW since fall and has been putting on the TLSO herself and spending most of the day up in the chair watching TV at home. Pt educated for all precautions, safety and brace donning. Pt with decreased activity tolerance and safety who will benefit from acute therapy to maximize mobility, safety and function to ensure adherence to precautions and decreased fall risk.      Follow Up Recommendations Home health PT;Supervision - Intermittent    Equipment Recommendations  None recommended by PT    Recommendations for Other Services       Precautions / Restrictions Precautions Precautions: Fall;Back Precaution Comments: reviewed 3/3 back precautions as pt with most difficulty not twisting Required Braces or Orthoses: Spinal Brace Spinal Brace: Thoracolumbosacral orthotic;Applied in sitting position Restrictions Weight Bearing Restrictions: No      Mobility  Bed Mobility Overal bed mobility: Needs Assistance Bed Mobility: Rolling;Sidelying to Sit Rolling: Supervision Sidelying to sit: Supervision       General bed mobility comments: cues for sequence to prevent twisting with rolling and transition to  sitting    Transfers Overall transfer level: Modified independent   Transfers: Sit to/from Stand Sit to Stand: Modified independent (Device/Increase time)            Ambulation/Gait Ambulation/Gait assistance: Supervision Gait Distance (Feet): 150 Feet Assistive device: Rolling walker (2 wheeled) Gait Pattern/deviations: Step-through pattern;Decreased stride length   Gait velocity interpretation: 1.31 - 2.62 ft/sec, indicative of limited community ambulator General Gait Details: pt able to direct and control RW with good posture, limited by fatigue  Stairs            Wheelchair Mobility    Modified Rankin (Stroke Patients Only)       Balance Overall balance assessment: Needs assistance   Sitting balance-Leahy Scale: Good     Standing balance support: Bilateral upper extremity supported Standing balance-Leahy Scale: Poor Standing balance comment: RW for gait                             Pertinent Vitals/Pain Pain Assessment: No/denies pain    Home Living Family/patient expects to be discharged to:: Private residence Living Arrangements: Spouse/significant other Available Help at Discharge: Family;Available 24 hours/day Type of Home: House Home Access: Stairs to enter Entrance Stairs-Rails: Psychiatric nurse of Steps: 3 Home Layout: Laundry or work area in basement;Able to live on main level with bedroom/bathroom Home Equipment: Environmental consultant - 2 wheels;Walker - 4 wheels;Cane - single point;Shower seat      Prior Function Level of Independence: Needs assistance   Gait / Transfers Assistance Needed: walks with walker  ADL's / Homemaking Assistance Needed: family assisting with housekeeping        Hand Dominance  Extremity/Trunk Assessment   Upper Extremity Assessment Upper Extremity Assessment: Generalized weakness    Lower Extremity Assessment Lower Extremity Assessment: Generalized weakness    Cervical /  Trunk Assessment Cervical / Trunk Assessment: Other exceptions Cervical / Trunk Exceptions: TLSO  Communication   Communication: No difficulties  Cognition Arousal/Alertness: Awake/alert Behavior During Therapy: WFL for tasks assessed/performed Overall Cognitive Status: Impaired/Different from baseline                           Safety/Judgement: Decreased awareness of safety     General Comments: cues for back precautions and brace donning      General Comments      Exercises     Assessment/Plan    PT Assessment Patient needs continued PT services  PT Problem List Decreased strength;Decreased mobility;Decreased safety awareness;Decreased activity tolerance;Decreased balance       PT Treatment Interventions DME instruction;Therapeutic activities;Gait training;Therapeutic exercise;Patient/family education;Balance training;Stair training;Functional mobility training    PT Goals (Current goals can be found in the Care Plan section)  Acute Rehab PT Goals Patient Stated Goal: return home PT Goal Formulation: With patient/family Time For Goal Achievement: 11/13/20 Potential to Achieve Goals: Good    Frequency Min 3X/week   Barriers to discharge Decreased caregiver support pt reports spouse is home with her but has not helped her with her brace, nieces are assisting with homemaking    Co-evaluation               AM-PAC PT "6 Clicks" Mobility  Outcome Measure Help needed turning from your back to your side while in a flat bed without using bedrails?: A Little Help needed moving from lying on your back to sitting on the side of a flat bed without using bedrails?: A Little Help needed moving to and from a bed to a chair (including a wheelchair)?: None Help needed standing up from a chair using your arms (e.g., wheelchair or bedside chair)?: None Help needed to walk in hospital room?: None Help needed climbing 3-5 steps with a railing? : A Little 6 Click  Score: 21    End of Session Equipment Utilized During Treatment: Back brace Activity Tolerance: Patient tolerated treatment well Patient left: in chair;with call bell/phone within reach;with family/visitor present Nurse Communication: Mobility status PT Visit Diagnosis: Difficulty in walking, not elsewhere classified (R26.2);Other abnormalities of gait and mobility (R26.89)    Time: 3220-2542 PT Time Calculation (min) (ACUTE ONLY): 22 min   Charges:   PT Evaluation $PT Eval Moderate Complexity: 1 Mod          [place], PT Acute Rehabilitation Services Pager: [phone] Office: [phone]   Sandy Salaam Dezirae Service 10/30/2020, 12:39 PM

## 2020-10-30 NOTE — Progress Notes (Signed)
PHARMACIST - PHYSICIAN COMMUNICATION DR:   Lupita Leash CONCERNING: Antibiotic IV to Oral Route Change Policy  RECOMMENDATION: This patient is receiving Flagyl by the intravenous route.  Based on criteria approved by the Pharmacy and Therapeutics Committee, the antibiotic(s) is/are being converted to the equivalent oral dose form(s).   DESCRIPTION: These criteria include:  Patient being treated for a respiratory tract infection, urinary tract infection, cellulitis or clostridium difficile associated diarrhea if on metronidazole  The patient is not neutropenic and does not exhibit a GI malabsorption state  The patient is eating (either orally or via tube) and/or has been taking other orally administered medications for a least 24 hours  The patient is improving clinically and has a Tmax < 100.5  If you have questions about this conversion, please contact the Pharmacy Department  []   [phone] )  [facility] []   [phone] )  [facility] [x]   [phone] )  [facility] []   [phone] )  [facility] []   [phone] )  [facility]

## 2020-10-30 NOTE — Discharge Summary (Signed)
Physician Discharge Summary  Dorothy Li XBD:[id number] DOB: 1946/04/13 DOA: 10/28/2020  PCP: [facility]  Admit date: 10/28/2020 Discharge date: 10/30/2020  Admitted From: HOME Disposition:  HOME  Recommendations for Outpatient Follow-up:  1. Follow up with PCP in 1-2 weeks 2. Follow-up with the pulmonary team/outpatient PET scan-if not able to [facility] 3. Please obtain BMP/CBC in one week 4. Please follow up on the following pending results:  Home Health:Yes  Equipment/Devices: no  Discharge Condition: Stable Code Status:   Code Status: Full Code Diet recommendation:  Diet Order            Diet regular Room service appropriate? 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 pylori , liver lesion, recent fall with T12 fracture managed with TLSO brace, recently identified left lower lung cavitary lesion presented to the ED with loss of appetite, fatigue, weight loss.  As per the report she has not had any alcohol since prior to the recent admission, has not did experience any subjective fevers or chills, but has had progressive fatigue and loss of appetite. She denies shortness of breath while at rest but has exertional dyspnea. She has some pleuritic chest discomfort. She has not been coughing much.  In the ED afebrile saturating low 90s on room air tachycardic tachypneic with stable BP, EKG sinus tachycardia QTC 591, chest x-ray left basilar infiltrate and development of left suprahilar opacity as well as interval development of a small parapneumonic effusion since her CT 2 weeks ago lab with hypokalemia anemia.  Patient was given 1.5 L normal saline antibiotic and admitted for further  management    Discharge Diagnoses:   Pneumonia Small left cavitary nodule  Left hilar adenopathy  CT chest -solid-appearing superior segment left lower lobe cavitary lesion which is suspicious with abnormal left hilar lymph node: Seen by pulmonary at bedside but plan is for outpatient PET scan after antibiotic therapy and outpatient pulmonary follow-up for further work-up.  Patient is amenable to the recommendation.   : Treated with ceftriaxone and Flagyl.  Currently patient is on amoxicillin Flagyl part of H. pylori treatment following.  Patient daughter tells me that he is planning for prolonged course of antibiotics.  CT chest reviewed, appreciate pulmonary follow-up.  Plan is for outpatient PET scan however Niece [name] tells me that they [facility] I have asked herto coordinate with pulmonary and [facility] p[facility]. Recent Labs  Lab 10/28/20 1932 10/29/20 0618 10/29/20 1002 10/30/20 0258  WBC 5.2  --  5.0 4.5  PROCALCITON  --  <0.10  --  <0.10   Hepatic lobe lesion on-Recent CT- OP PET.  Mild hyperkalemia resolved  Chronic combined systolic diastolic CHF with EF 30 to 35% grade 1 DD in April 2022.  Currently volume status is stable.  Continue coreg.  Sinus tachycardia -heart rate appears much better continue home Coreg  CAD with history of CABG: No chest pain or anginal symptoms.  Continue ASA and Coreg. AICD in place-being interrogated.  Prolonged QTC 591 monitor.  Minimize QT prolonging medication.  Anemia hemoglobin at 9.3 g, about the baseline, macrocytic.  Likely  2/2 previous alcohol abuse chronic disease Recent Labs  Lab 10/28/20 1932 10/29/20 1002 10/30/20 0258  HGB 9.3* 9.4* 9.2*  HCT 28.9* 30.0* 28.1*   Weight loss attributes to low appetite. dietitian was consulted.  Stable T12 and L1 compression fractures, healing anterior left seventh rib and right posterior 11th rib fracture/ Recent T12 burst fracture-seen by neurosurgery 4/14 had advised mobilization w/  TLSO and outpatient follow-up in 4 weeks with repeat radiograph.  Severe protein calorie malnutrition, augment diet.  Seen by dietitian  Consults:  PCCM  Subjective: AAOX3, ON RA, NO COMPLAINTS, WANTS TO GO HOME TODAY  Discharge Exam: Vitals:   10/29/20 1541 10/29/20 2022  BP: (!) 140/106 108/79  Pulse: (!) 110 (!) 102  Resp: 18 16  Temp: 98.6 F (37 C) 98.5 F (36.9 C)  SpO2: 97%    General: Pt is alert, awake, not in acute distress Cardiovascular: RRR, S1/S2 +, no rubs, no gallops Respiratory: CTA bilaterally, no wheezing, no rhonchi Abdominal: Soft, NT, ND, bowel sounds + Extremities: no edema, no cyanosis  Discharge Instructions  Discharge Instructions    Diet - low sodium heart healthy   Complete by: As directed    Discharge instructions   Complete by: As directed    You will need outpatient PET scan and need to follow up with your pulmonary [facility]. [facility] at [facility] is already aware and and setting up the appointment.   Increase activity slowly   Complete by: As directed      Allergies as of 10/30/2020      Reactions   Penicillins Other (See Comments)   "blacks out" per pt   Latex Hives, Other (See Comments)   Gloves   Tape Itching, Rash   Bandage on breast from bx caused rash      Medication List    STOP taking these medications   enalapril 2.5 MG tablet Commonly known as: VASOTEC     TAKE these medications   acetaminophen 325 MG tablet Commonly known as: TYLENOL Take 650 mg by mouth every 6 (six) hours as needed for moderate pain or fever.   alendronate 70 MG tablet Commonly known as: FOSAMAX Take 70 mg by mouth every Monday. Take with a full glass of water on an empty stomach.   amoxicillin 500 MG capsule Commonly known as: AMOXIL Take 1,000 mg by mouth 2 (two) times daily.   anastrozole 1 MG tablet Commonly known as: ARIMIDEX Take 1 tablet (1 mg total) by mouth daily.   aspirin EC 81 MG tablet Take 1 tablet (81  mg total) by mouth daily.   atorvastatin 80 MG tablet Commonly known as: LIPITOR Take 40 mg by mouth at bedtime.   Calcium-Vitamin D-Vitamin K 622-633-35 MG-UNT-MCG Tabs Take 1 tablet by mouth daily.   carvedilol 6.25 MG tablet Commonly known as: COREG Take 6.25 mg by mouth 2 (two) times daily with a meal.   erythromycin base 500 MG tablet Commonly known as: E-MYCIN Take 500 mg by mouth 2 (two) times daily.   feeding supplement Liqd Take 237 mLs by mouth 3 (three) times daily between meals.   hydrALAZINE 50 MG tablet Commonly known as: APRESOLINE Take 25 mg by mouth 3 (three) times daily.   ibuprofen 200 MG tablet Commonly known as: ADVIL Take 200 mg by mouth every 6 (six) hours as needed for headache or mild pain.   LACTOBACILLUS PROBIOTIC PO Take 1 capsule by mouth daily.   metroNIDAZOLE 250  MG tablet Commonly known as: FLAGYL Take 1,000 mg by mouth 2 (two) times daily.   multivitamin with minerals tablet Take 1 tablet by mouth daily.   pantoprazole 40 MG tablet Commonly known as: PROTONIX Take 40 mg by mouth 2 (two) times daily before a meal.   sertraline 100 MG tablet Commonly known as: ZOLOFT Take 100 mg by mouth daily.   traZODone 50 MG tablet Commonly known as: DESYREL Take 50 mg by mouth at bedtime.   vitamin B-12 1000 MCG tablet Commonly known as: CYANOCOBALAMIN Take 1,000 mcg by mouth every other day.   Vitamin D3 75 MCG (3000 UT) Tabs Take 3,000 Units by mouth daily.       Allergies  Allergen Reactions  . Penicillins Other (See Comments)    "blacks out" per pt   . Latex Hives and Other (See Comments)    Gloves  . Tape Itching and Rash    Bandage on breast from bx caused rash    The results of significant diagnostics from this hospitalization (including imaging, microbiology, ancillary and laboratory) are listed below for reference.    Microbiology: Recent Results (from the past 240 hour(s))  Resp Panel by RT-PCR (Flu A&B, Covid)  Nasopharyngeal Swab     Status: None   Collection Time: 10/29/20  4:40 AM   Specimen: Nasopharyngeal Swab; Nasopharyngeal(NP) swabs in vial transport medium  Result Value Ref Range Status   SARS Coronavirus 2 by RT PCR NEGATIVE NEGATIVE Final    Comment: (NOTE) SARS-CoV-2 target nucleic acids are NOT DETECTED.  The SARS-CoV-2 RNA is generally detectable in upper respiratory specimens during the acute phase of infection. The lowest concentration of SARS-CoV-2 viral copies this assay can detect is 138 copies/mL. A negative result does not preclude SARS-Cov-2 infection and should not be used as the sole basis for treatment or other patient management decisions. A negative result may occur with  improper specimen collection/handling, submission of specimen other than nasopharyngeal swab, presence of viral mutation(s) within the areas targeted by this assay, and inadequate number of viral copies(<138 copies/mL). A negative result must be combined with clinical observations, patient history, and epidemiological information. The expected result is Negative.  Fact Sheet for Patients:  EntrepreneurPulse.com.au  Fact Sheet for Healthcare Providers:  IncredibleEmployment.be  This test is no t yet approved or cleared by the [place] FDA and  has been authorized for detection and/or diagnosis of SARS-CoV-2 by FDA under an Emergency Use Authorization (EUA). This EUA will remain  in effect (meaning this test can be used) for the duration of the COVID-19 declaration under Section 564(b)(1) of the Act, 21 U.S.C.section 360bbb-3(b)(1), unless the authorization is terminated  or revoked sooner.       Influenza A by PCR NEGATIVE NEGATIVE Final   Influenza B by PCR NEGATIVE NEGATIVE Final    Comment: (NOTE) The Xpert Xpress SARS-CoV-2/FLU/RSV plus assay is intended as an aid in the diagnosis of influenza from Nasopharyngeal swab specimens and should not be  used as a sole basis for treatment. Nasal washings and aspirates are unacceptable for Xpert Xpress SARS-CoV-2/FLU/RSV testing.  Fact Sheet for Patients: EntrepreneurPulse.com.au  Fact Sheet for Healthcare Providers: IncredibleEmployment.be  This test is not yet approved or cleared by the [place] FDA and has been authorized for detection and/or diagnosis of SARS-CoV-2 by FDA under an Emergency Use Authorization (EUA). This EUA will remain in effect (meaning this test can be used) for the duration of the COVID-19 declaration under Section 564(b)(1) of  the Act, 21 U.S.C. section 360bbb-3(b)(1), unless the authorization is terminated or revoked.  Performed at [facility], [place] [address] [place], [name] [address]     Procedures/Studies: DG Ribs Unilateral W/Chest Right  Result Date: 10/15/2020 CLINICAL DATA:  [place], pain EXAM: RIGHT RIBS AND CHEST - 3+ VIEW COMPARISON:  11/04/2017 FINDINGS: Frontal view of the chest as well as oblique views of the right thoracic cage are obtained. Please note that the frontal view of the chest is labeled incorrectly, as previous exams demonstrate the pacemaker generator to be within the left chest wall. Pacer/AICD is unchanged. Cardiac silhouette is stable. No airspace disease, effusion, or pneumothorax. There is a minimally displaced right posterolateral seventh rib fracture, best seen on the frontal image. No other acute bony abnormalities. IMPRESSION: 1. Minimally displaced right posterolateral seventh rib fracture. 2. Otherwise no acute intrathoracic process. Electronically Signed   By: Randa Ngo M.D.   On: 10/15/2020 21:31   CT Head Wo Contrast  Result Date: 10/15/2020 CLINICAL DATA:  Patient's niece reported that patient has not been eating for the past several days , drinking ETOH daily , fell yesterday with right ribcage pain , fatigue and generalized weakness , hypotensive at triage . EXAM: CT  HEAD WITHOUT CONTRAST CT CERVICAL SPINE WITHOUT CONTRAST TECHNIQUE: Multidetector CT imaging of the head and cervical spine was performed following the standard protocol without intravenous contrast. Multiplanar CT image reconstructions of the cervical spine were also generated. COMPARISON:  None. FINDINGS: CT HEAD FINDINGS Brain: Cerebral ventricle sizes are concordant with the degree of cerebral volume loss. Patchy and confluent areas of decreased attenuation are noted throughout the deep and periventricular white matter of the cerebral hemispheres bilaterally, compatible with chronic microvascular ischemic disease. No evidence of large-territorial acute infarction. No parenchymal hemorrhage. No mass lesion. No extra-axial collection. No mass effect or midline shift. No hydrocephalus. Basilar cisterns are patent. Vascular: No hyperdense vessel. Atherosclerotic calcifications are present within the cavernous internal carotid arteries. Skull: No acute fracture or focal lesion. Sinuses/Orbits: Paranasal sinuses and mastoid air cells are clear. The orbits are unremarkable. Other: None. CT CERVICAL SPINE FINDINGS Alignment: Normal. Skull base and vertebrae: No acute fracture. No aggressive appearing focal osseous lesion or focal pathologic process. Soft tissues and spinal canal: No prevertebral fluid or swelling. No visible canal hematoma. Upper chest: Emphysematous changes. Other: Mild to moderate atherosclerotic plaque of the carotid arteries in the neck. IMPRESSION: 1. No acute intracranial abnormality. 2. No acute displaced fracture or traumatic listhesis of the cervical spine. 3.  Emphysema (ICD10-J43.9). Electronically Signed   By: Iven Finn M.D.   On: 10/15/2020 22:03   CT CHEST WO CONTRAST  Result Date: 10/29/2020 CLINICAL DATA:  75 year old female with unresolved pneumonia. Status post trauma earlier this month. EXAM: CT CHEST WITHOUT CONTRAST TECHNIQUE: Multidetector CT imaging of the chest was  performed following the standard protocol without IV contrast. COMPARISON:  Chest radiographs earlier today. CT Chest, Abdomen, and Pelvis 10/15/2020 FINDINGS: Cardiovascular: Left chest AICD. Prior CABG. Stable mild cardiomegaly. No pericardial effusion. Calcified aortic atherosclerosis. Vascular patency is not evaluated in the absence of IV contrast. Mediastinum/Nodes: Stable increased soft tissue at the left hilum from earlier this month corresponding to 16 mm short axis lymph node at that time. No superimposed mediastinal lymphadenopathy identified. Lungs/Pleura: Small left and trace right layering pleural effusions are new from earlier this month. No pneumothorax. Centrilobular emphysema. Major airways remain patent. On the right there is mild dependent atelectasis.  On the left there is a greater degree of distal peribronchial and subpleural lower lobe opacity now (series 4, image 105) and the superior segment lower lobe cavitary lesion now appears more solid on series 4, image 83. No air bronchograms. Upper Abdomen: Stable, negative visible upper abdomen. Musculoskeletal: Osteopenia.  Prior sternotomy. Subtle anterior left 7th rib fracture on series 4, image 120 is likely related to the recent trauma. But no other definite recent left side rib fracture is identified. There is a healing nondisplaced right posterior 11th rib fracture. Stable chronic right posterior 7th rib fracture. T12 and L1 compression fractures are stable. No other acute or subacute osseous abnormality identified. No suspicious osseous lesion IMPRESSION: 1. Small left and trace right layering pleural effusions are new from earlier this month. 2. Increasing but nonspecific left lower lobe opacity. This could be worsening atelectasis or acute infection. Pulmonary contusion less likely. More solid appearance of the superior segment left lower lobe cavitary lesion, which is suspicious along with abnormal left hilar lymph node. These will require  follow-up. Underlying Emphysema (ICD10-J43.9). Consider referral to [facility]). 3. Recent, healing anterior left 7th rib and right posterior 11th rib fracture suspected. Stable T12 and L1 compression fractures. 4. Aortic Atherosclerosis (ICD10-I70.0). Electronically Signed   By: Genevie Ann M.D.   On: 10/29/2020 08:53   CT Cervical Spine Wo Contrast  Result Date: 10/15/2020 CLINICAL DATA:  Patient's niece reported that patient has not been eating for the past several days , drinking ETOH daily , fell yesterday with right ribcage pain , fatigue and generalized weakness , hypotensive at triage . EXAM: CT HEAD WITHOUT CONTRAST CT CERVICAL SPINE WITHOUT CONTRAST TECHNIQUE: Multidetector CT imaging of the head and cervical spine was performed following the standard protocol without intravenous contrast. Multiplanar CT image reconstructions of the cervical spine were also generated. COMPARISON:  None. FINDINGS: CT HEAD FINDINGS Brain: Cerebral ventricle sizes are concordant with the degree of cerebral volume loss. Patchy and confluent areas of decreased attenuation are noted throughout the deep and periventricular white matter of the cerebral hemispheres bilaterally, compatible with chronic microvascular ischemic disease. No evidence of large-territorial acute infarction. No parenchymal hemorrhage. No mass lesion. No extra-axial collection. No mass effect or midline shift. No hydrocephalus. Basilar cisterns are patent. Vascular: No hyperdense vessel. Atherosclerotic calcifications are present within the cavernous internal carotid arteries. Skull: No acute fracture or focal lesion. Sinuses/Orbits: Paranasal sinuses and mastoid air cells are clear. The orbits are unremarkable. Other: None. CT CERVICAL SPINE FINDINGS Alignment: Normal. Skull base and vertebrae: No acute fracture. No aggressive appearing focal osseous lesion or focal pathologic process. Soft tissues and spinal canal: No  prevertebral fluid or swelling. No visible canal hematoma. Upper chest: Emphysematous changes. Other: Mild to moderate atherosclerotic plaque of the carotid arteries in the neck. IMPRESSION: 1. No acute intracranial abnormality. 2. No acute displaced fracture or traumatic listhesis of the cervical spine. 3.  Emphysema (ICD10-J43.9). Electronically Signed   By: Iven Finn M.D.   On: 10/15/2020 22:03   CT CHEST ABDOMEN PELVIS W CONTRAST  Result Date: 10/16/2020 CLINICAL DATA:  Trauma. EXAM: CT CHEST, ABDOMEN, AND PELVIS WITH CONTRAST TECHNIQUE: Multidetector CT imaging of the chest, abdomen and pelvis was performed following the standard protocol during bolus administration of intravenous contrast. CONTRAST:  112mL OMNIPAQUE IOHEXOL 300 MG/ML  SOLN COMPARISON:  Chest x-ray 11/04/2017. FINDINGS: CHEST: Ports and Devices: Left chest wall 2 lead cardiac device. Lungs/airways: Bilateral lower lobe subsegmental atelectasis. Left lower  lobe scarring versus atelectasis. Left lower lobe 1.7 cm cavitary lung lesion. Cluster of pulmonary micronodules measuring up to 5 mm in area within the right middle lobe (4:89). No focal consolidation. No pulmonary mass. No pulmonary contusion or laceration. The central airways are patent. Pleura: No pleural effusion. No pneumothorax. No hemothorax. Lymph Nodes: There is a 1.6 cm left hilar lymph node (3:30, 5:73). No mediastinal or axillary lymphadenopathy. Mediastinum: No pneumomediastinum. No aortic injury or mediastinal hematoma. The thoracic aorta is normal in caliber. Atherosclerotic plaque of the thoracic aorta as well as four-vessel coronary artery calcifications status post CABG. The heart is normal in size. No significant pericardial effusion. The main pulmonary artery is normal in caliber. No central pulmonary embolus. The esophagus is unremarkable. Subcentimeter hypodense nodules within thyroid gland. Chest Wall / Breasts: No chest wall mass. Musculoskeletal: Subacute to  chronic appearing nondisplaced right rib fractures. No acute displaced rib or sternal fracture. Sternotomy wires. Large Schmorl node of the T11 vertebral body with associated vertebral body height loss centrally. Fracture of the T12 vertebral body involving the anterior and posterior wall as well as superior endplate consistent with a burst fracture. 2 mm retropulsion into the central canal. No suspicious lytic or blastic osseous lesions. ABDOMEN / PELVIS: Liver: The left hepatic lobe is enlarged with suggestion of of a nodular hepatic contour. The hepatic parenchyma is diffusely hypodense compared to the splenic parenchyma consistent with fatty infiltration. No focal liver abnormality. There is a 3.6 cm hypodense right hepatic lobe lesion (3:51). No gallstones, gallbladder wall thickening, or pericholecystic fluid. No biliary dilatation. No laceration or subcapsular hematoma. Biliary System: The gallbladder is otherwise unremarkable with no radio-opaque gallstones. No biliary ductal dilatation. Pancreas: Normal pancreatic contour. No main pancreatic duct dilatation. Spleen: Not enlarged. No focal lesion. No laceration, subcapsular hematoma, or vascular injury. Adrenal Glands: No nodularity bilaterally. Kidneys: Bilateral kidneys enhance symmetrically. Fluid density lesions within bilateral kidneys likely represent simple renal cysts. Subcentimeter hypodensities are too small to characterize. There is a 1.8 cm left renal cystic lesion with a thin septation likely representing a minimally complex renal cyst. No hydronephrosis. No contusion, laceration, or subcapsular hematoma. No injury to the vascular structures or collecting systems. No hydroureter. The urinary bladder is unremarkable. Bowel: No small or large bowel wall thickening or dilatation. Scattered colonic diverticulosis. The appendix not definitely identified. Mesentery, Omentum, and Peritoneum: No simple free fluid ascites. No pneumoperitoneum. No  hemoperitoneum. No mesenteric hematoma identified. No organized fluid collection. Pelvic Organs: Normal. Lymph Nodes: No abdominal, pelvic, inguinal lymphadenopathy. Vasculature: Severe atherosclerotic plaque of the aorta and its branches. No abdominal aorta or iliac aneurysm. No active contrast extravasation or pseudoaneurysm. Musculoskeletal: No significant soft tissue hematoma. No suspicious lytic or blastic osseous lesion. No acute pelvic fracture. No spinal fracture. Avascular necrosis of the right femoral head. IMPRESSION: 1. Left lower lobe 1.7 cm cavitary lung lesion. Associated left hilar lymphadenopathy. Finding could represent infection versus malignancy. Additional imaging evaluation or consultation with Pulmonology or Thoracic Surgery recommended 2. Question cirrhotic morphology with a 3.6 cm right hepatic lobe lesion. Recommend MRI liver protocol for further evaluation. 3. Age-indeterminate, possibly acute, burst fracture of the T12 vertebral body with 2 mm retropulsion into the central canal. Vertebral body height loss of the T11 vertebral body likely due to degenerative changes. Correlate with point tenderness to palpation for an acute component. Consider MRI lumbar spine for further evaluation. 4. Otherwise no acute traumatic injury to the chest, abdomen, or pelvis. 5. No acute  fracture or traumatic malalignment of the lumbar spine. 6. Other imaging findings of potential clinical significance: Cluster of pulmonary nodules within the right middle lobe with area measuring up to 5 mm. Scattered colonic diverticulosis with no acute diverticulitis. Minimally complex left renal cyst. Avascular necrosis of the right femoral head. Aortic Atherosclerosis (ICD10-I70.0). Electronically Signed   By: Iven Finn M.D.   On: 10/16/2020 00:02   CT T-SPINE NO CHARGE  Result Date: 10/16/2020 CLINICAL DATA:  Spinal fracture.  Recent falls. EXAM: CT THORACIC SPINE WITHOUT CONTRAST TECHNIQUE: Multidetector CT  images of the thoracic were obtained using the standard protocol without intravenous contrast. CT chest images reconstructed from CT chest abdomen pelvis 10/15/2020 COMPARISON:  CT chest abdomen pelvis 10/15/2020. FINDINGS: Alignment: Normal alignment. Vertebrae: Compression fractures of T12 and L1 which appear acute. Thoracic numbering performed from the craniocervical junction using CT cervical spine 10/15/2020. 30% loss of height of T12 vertebral body due to acute fracture. 40% loss of height of L1 vertebral body. Fractures are not causing significant stenosis. No fracture in the posterior elements. Paraspinal and other soft tissues: Negative for paraspinous mass or adenopathy. Mild bibasilar atelectasis. No pleural effusion. 16 mm left hilar lymph node as described on prior chest CT report Disc levels: Mild disc degeneration is seen throughout the thoracic spine with disc space narrowing and disc calcification. No significant spinal stenosis identified. Mild facet degeneration is present in the thoracic spine. IMPRESSION: Acute fractures superior endplates of B35 and L1 without significant spinal stenosis. Electronically Signed   By: Franchot Gallo M.D.   On: 10/16/2020 14:29   CT L-SPINE NO CHARGE  Result Date: 10/16/2020 CLINICAL DATA:  Recent fall.  Compression fracture. EXAM: CT LUMBAR SPINE WITHOUT CONTRAST TECHNIQUE: Multidetector CT imaging of the lumbar spine was performed without intravenous contrast administration. Multiplanar CT image reconstructions were also generated. COMPARISON:  None. FINDINGS: Segmentation: S1 is fully lumbarized when counting from the craniocervical junction. Alignment: Normal Vertebrae: Superior endplate fractures of H29 and L1 appear acute. L1 compression fracture approximately 40% loss of height and T12 fracture approximately 30% loss of height. No significant retropulsion of bone into the canal. No posterior element fractures. No mass lesion identified. Paraspinal and  other soft tissues: Vascular contrast present. Lumbar spine images reformatted from CT chest abdomen pelvis with contrast. No paraspinous edema or mass identified. Atherosclerotic aorta. Disc levels: T12-L1: Disc degeneration and mild spurring. Negative for stenosis L1-2: Negative for stenosis L2-3: Mild disc bulging.  Negative for stenosis L3-4: Mild disc bulging and mild facet hypertrophy. Negative for stenosis L4-5: Moderate disc bulging. Mild to moderate facet hypertrophy bilaterally. Mild spinal stenosis and mild subarticular stenosis bilaterally L5-S1: Disc bulging and facet degeneration. Mild subarticular stenosis bilaterally S1-2: Mild facet degeneration. Mild disc bulging. Negative for stenosis. IMPRESSION: Acute superior endplate fractures of J24 and L1. No significant spinal stenosis. No evidence of underlying mass lesion S1 is fully lumbarized based on counting from the craniocervical junction. Electronically Signed   By: Franchot Gallo M.D.   On: 10/16/2020 14:23   DG Chest Port 1 View  Result Date: 10/29/2020 CLINICAL DATA:  Tachycardia EXAM: PORTABLE CHEST 1 VIEW COMPARISON:  10/15/2020 FINDINGS: Progressive left basilar atelectasis or infiltrate. Small left pleural effusion has developed. Left suprahilar opacity has developed, new since prior examination, possibly infectious or inflammatory in nature. No pneumothorax. No pleural effusion on the right. Coronary artery bypass grafting has been performed. Cardiac size is within normal limits. Left subclavian dual lead pacemaker defibrillator is  unchanged. No acute bone abnormality. IMPRESSION: Progressive left basilar infiltrate and development of a left suprahilar opacity, new since CT examination 10/15/2020, suspicious for an acute infectious process in the appropriate clinical setting. Interval development of small left parapneumonic effusion. Electronically Signed   By: Fidela Salisbury MD   On: 10/29/2020 03:37   ECHOCARDIOGRAM  COMPLETE  Result Date: 10/16/2020    ECHOCARDIOGRAM REPORT   Patient Name:   KHUSHI ZUPKO Date of Exam: 10/16/2020 Medical Rec #:  [id number]    Height:       69.0 in Accession #:    [id number]   Weight:       121.3 lb Date of Birth:  [birth year]   BSA:          1.670 m Patient Age:    34 years     BP:           96/43 mmHg Patient Gender: F            HR:           90 bpm. Exam Location:  Inpatient Procedure: 2D Echo, Cardiac Doppler and Color Doppler Indications:    CHF  History:        Patient has prior history of Echocardiogram examinations, most                 recent 12/13/2013. Cardiomyopathy and CHF,                 Signs/Symptoms:Syncope; Risk Factors:Hypertension and                 Dyslipidemia.  Sonographer:    Luisa Hart RDCS Referring Phys: [id number] RAFAL POPLAWSKI  Sonographer Comments: Technically difficult study due to poor echo windows. Had to use light touch on patient due to sore ribs. 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 Left ventricular ejection fraction, by estimation, is 30 to 35%. The left ventricle has moderately decreased function. The left ventricle demonstrates global hypokinesis. Left ventricular diastolic parameters are consistent with Grade I diastolic dysfunction (impaired relaxation).  2. Right ventricular systolic function is mildly reduced. The right ventricular size is mildly enlarged. There is normal pulmonary artery systolic pressure. The estimated right ventricular systolic pressure is 49.7 mmHg.  3. The mitral valve is normal in structure. No evidence of mitral valve regurgitation. No evidence of mitral stenosis.  4. The aortic valve is tricuspid. Aortic valve regurgitation is not visualized. No aortic stenosis is present.  5. The inferior vena cava is normal in size with greater than 50% respiratory variability, suggesting right atrial pressure of 3 mmHg. FINDINGS  Left Ventricle: Left ventricular ejection fraction, by estimation, is 30 to 35%. The left ventricle  has moderately decreased function. The left ventricle demonstrates global hypokinesis. The left ventricular internal cavity size was normal in size. There is no left ventricular hypertrophy. Left ventricular diastolic parameters are consistent with Grade I diastolic dysfunction (impaired relaxation). Right Ventricle: The right ventricular size is mildly enlarged. No increase in right ventricular wall thickness. Right ventricular systolic function is mildly reduced. There is normal pulmonary artery systolic pressure. The tricuspid regurgitant velocity  is 2.82 m/s, and with an assumed right atrial pressure of 3 mmHg, the estimated right ventricular systolic pressure is 02.6 mmHg. Left Atrium: Left atrial size was normal in size. Right Atrium: Right atrial size was normal in size. Pericardium: There is no evidence of pericardial effusion. Mitral Valve: The mitral valve is normal in structure. No evidence of  mitral valve regurgitation. No evidence of mitral valve stenosis. Tricuspid Valve: The tricuspid valve is normal in structure. Tricuspid valve regurgitation is trivial. Aortic Valve: The aortic valve is tricuspid. Aortic valve regurgitation is not visualized. No aortic stenosis is present. Aortic valve mean gradient measures 2.0 mmHg. Aortic valve peak gradient measures 3.2 mmHg. Aortic valve area, by VTI measures 1.95 cm. Pulmonic Valve: The pulmonic valve was normal in structure. Pulmonic valve regurgitation is trivial. Aorta: The aortic root is normal in size and structure. Venous: The inferior vena cava is normal in size with greater than 50% respiratory variability, suggesting right atrial pressure of 3 mmHg. IAS/Shunts: No atrial level shunt detected by color flow Doppler. Additional Comments: A device lead is visualized in the right ventricle.  LEFT VENTRICLE PLAX 2D LVIDd:         5.40 cm     Diastology LVIDs:         4.20 cm     LV e' medial:    5.08 cm/s LV PW:         1.10 cm     LV E/e' medial:  9.5 LV  IVS:        1.10 cm     LV e' lateral:   9.11 cm/s LVOT diam:     2.20 cm     LV E/e' lateral: 5.3 LV SV:         34 LV SV Index:   20 LVOT Area:     3.80 cm  LV Volumes (MOD) LV vol d, MOD A2C: 29.9 ml LV vol d, MOD A4C: 45.1 ml LV vol s, MOD A2C: 25.2 ml LV vol s, MOD A4C: 36.2 ml LV SV MOD A2C:     4.7 ml LV SV MOD A4C:     45.1 ml LV SV MOD BP:      6.7 ml RIGHT VENTRICLE TAPSE (M-mode): 0.6 cm LEFT ATRIUM             Index       RIGHT ATRIUM          Index LA diam:        3.50 cm 2.10 cm/m  RA Area:     9.02 cm LA Vol (A2C):   26.5 ml 15.87 ml/m RA Volume:   16.70 ml 10.00 ml/m LA Vol (A4C):   25.8 ml 15.45 ml/m LA Biplane Vol: 28.0 ml 16.76 ml/m  AORTIC VALVE                   PULMONIC VALVE AV Area (Vmax):    2.60 cm    PV Vmax:       0.66 m/s AV Area (Vmean):   2.04 cm    PV Vmean:      56.600 cm/s AV Area (VTI):     1.95 cm    PV VTI:        0.165 m AV Vmax:           88.80 cm/s  PV Peak grad:  1.8 mmHg AV Vmean:          67.100 cm/s PV Mean grad:  1.0 mmHg AV VTI:            0.172 m AV Peak Grad:      3.2 mmHg AV Mean Grad:      2.0 mmHg LVOT Vmax:         60.80 cm/s LVOT Vmean:        36.000 cm/s  LVOT VTI:          0.088 m LVOT/AV VTI ratio: 0.51  AORTA Ao Root diam: 3.30 cm Ao Asc diam:  3.10 cm MITRAL VALVE               TRICUSPID VALVE MV Area (PHT): 3.23 cm    TR Peak grad:   31.8 mmHg MV Decel Time: 235 msec    TR Vmax:        282.00 cm/s MV E velocity: 48.10 cm/s MV A velocity: 65.60 cm/s  SHUNTS MV E/A ratio:  0.73        Systemic VTI:  0.09 m                            Systemic Diam: 2.20 cm Loralie Champagne MD Electronically signed by Loralie Champagne MD Signature Date/Time: 10/16/2020/3:12:37 PM    Final     Labs: BNP (last 3 results) Recent Labs    10/15/20 2200 10/29/20 1002  BNP 72.7 431.5*   Basic Metabolic Panel: Recent Labs  Lab 10/28/20 1932 10/29/20 0618 10/30/20 0258  NA 139 139 137  K 3.4* 3.5 4.0  CL 106 111 106  CO2 26 19* 25  GLUCOSE 93 109* 110*  BUN 9 10  7*  CREATININE 0.63 0.70 0.70  CALCIUM 8.4* 7.8* 8.5*  MG  --   --  1.4*   Liver Function Tests: Recent Labs  Lab 10/29/20 0618 10/30/20 0258  AST 31 24  ALT 17 17  ALKPHOS 66 66  BILITOT 0.7 0.7  PROT 5.6* 5.4*  ALBUMIN 2.5* 2.4*   No results for input(s): LIPASE, AMYLASE in the last 168 hours. No results for input(s): AMMONIA in the last 168 hours. CBC: Recent Labs  Lab 10/28/20 1932 10/29/20 1002 10/30/20 0258  WBC 5.2 5.0 4.5  HGB 9.3* 9.4* 9.2*  HCT 28.9* 30.0* 28.1*  MCV 105.9* 113.2* 105.6*  PLT 264 258 254   Cardiac Enzymes: No results for input(s): CKTOTAL, CKMB, CKMBINDEX, TROPONINI in the last 168 hours. BNP: Invalid input(s): POCBNP CBG: No results for input(s): GLUCAP in the last 168 hours. D-Dimer No results for input(s): DDIMER in the last 72 hours. Hgb A1c No results for input(s): HGBA1C in the last 72 hours. Lipid Profile No results for input(s): CHOL, HDL, LDLCALC, TRIG, CHOLHDL, LDLDIRECT in the last 72 hours. Thyroid function studies No results for input(s): TSH, T4TOTAL, T3FREE, THYROIDAB in the last 72 hours.  Invalid input(s): FREET3 Anemia work up No results for input(s): VITAMINB12, FOLATE, FERRITIN, TIBC, IRON, RETICCTPCT in the last 72 hours. Urinalysis    Component Value Date/Time   COLORURINE AMBER (A) 10/29/2020 0210   APPEARANCEUR HAZY (A) 10/29/2020 0210   LABSPEC 1.020 10/29/2020 0210   PHURINE 8.0 10/29/2020 0210   GLUCOSEU NEGATIVE 10/29/2020 0210   HGBUR NEGATIVE 10/29/2020 0210   BILIRUBINUR NEGATIVE 10/29/2020 0210   KETONESUR NEGATIVE 10/29/2020 0210   PROTEINUR NEGATIVE 10/29/2020 0210   UROBILINOGEN 0.2 01/12/2012 0842   NITRITE NEGATIVE 10/29/2020 0210   LEUKOCYTESUR NEGATIVE 10/29/2020 0210   Sepsis Labs Invalid input(s): PROCALCITONIN,  WBC,  LACTICIDVEN Microbiology Recent Results (from the past 240 hour(s))  Resp Panel by RT-PCR (Flu A&B, Covid) Nasopharyngeal Swab     Status: None   Collection Time:  10/29/20  4:40 AM   Specimen: Nasopharyngeal Swab; Nasopharyngeal(NP) swabs in vial transport medium  Result Value Ref Range Status   SARS Coronavirus 2 by  RT PCR NEGATIVE NEGATIVE Final    Comment: (NOTE) SARS-CoV-2 target nucleic acids are NOT DETECTED.  The SARS-CoV-2 RNA is generally detectable in upper respiratory specimens during the acute phase of infection. The lowest concentration of SARS-CoV-2 viral copies this assay can detect is 138 copies/mL. A negative result does not preclude SARS-Cov-2 infection and should not be used as the sole basis for treatment or other patient management decisions. A negative result may occur with  improper specimen collection/handling, submission of specimen other than nasopharyngeal swab, presence of viral mutation(s) within the areas targeted by this assay, and inadequate number of viral copies(<138 copies/mL). A negative result must be combined with clinical observations, patient history, and epidemiological information. The expected result is Negative.  Fact Sheet for Patients:  EntrepreneurPulse.com.au  Fact Sheet for Healthcare Providers:  IncredibleEmployment.be  This test is no t yet approved or cleared by the [place] FDA and  has been authorized for detection and/or diagnosis of SARS-CoV-2 by FDA under an Emergency Use Authorization (EUA). This EUA will remain  in effect (meaning this test can be used) for the duration of the COVID-19 declaration under Section 564(b)(1) of the Act, 21 U.S.C.section 360bbb-3(b)(1), unless the authorization is terminated  or revoked sooner.       Influenza A by PCR NEGATIVE NEGATIVE Final   Influenza B by PCR NEGATIVE NEGATIVE Final    Comment: (NOTE) The Xpert Xpress SARS-CoV-2/FLU/RSV plus assay is intended as an aid in the diagnosis of influenza from Nasopharyngeal swab specimens and should not be used as a sole basis for treatment. Nasal washings  and aspirates are unacceptable for Xpert Xpress SARS-CoV-2/FLU/RSV testing.  Fact Sheet for Patients: EntrepreneurPulse.com.au  Fact Sheet for Healthcare Providers: IncredibleEmployment.be  This test is not yet approved or cleared by the [place] FDA and has been authorized for detection and/or diagnosis of SARS-CoV-2 by FDA under an Emergency Use Authorization (EUA). This EUA will remain in effect (meaning this test can be used) for the duration of the COVID-19 declaration under Section 564(b)(1) of the Act, 21 U.S.C. section 360bbb-3(b)(1), unless the authorization is terminated or revoked.  Performed at [facility], [place] [address] [place], [place] [address]      Time coordinating discharge: 25 minutes  SIGNED: Antonieta Pert, MD  [facility] 10/30/2020, 2:27 PM  If 7PM-7AM, please contact night-coverage [email or web address]

## 2020-10-30 NOTE — Progress Notes (Signed)
Initial Nutrition Assessment  DOCUMENTATION CODES:   Underweight,Severe malnutrition in context of chronic illness  INTERVENTION:   -Ensure Enlive po TID, each supplement provides 350 kcal and 20 grams of protein -MVI with minerals daily -Liberalize diet to regular  NUTRITION DIAGNOSIS:   Severe Malnutrition related to chronic illness (CHF) as evidenced by moderate fat depletion,severe fat depletion,moderate muscle depletion,severe muscle depletion.  GOAL:   Patient will meet greater than or equal to 90% of their needs  MONITOR:   PO intake,Supplement acceptance,Labs,Weight trends,Skin,I & O's  REASON FOR ASSESSMENT:   Consult Assessment of nutrition requirement/status  ASSESSMENT:   Dorothy Li is a 75 y.o. 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 pylori with quadruple therapy, liver lesion, recent fall with T12 fracture managed with TLSO brace, recently identified left lower lung cavitary lesion, now presenting to the emergency department with loss of appetite and fatigue  Pt admitted with pneumonia.   Reviewed I/O's: +1 L x 24 hours  Per PCCM notes, CT chest showed trace pleural effusions, nonspecific left lower lobe opacity with superior segment cavitary lesion and abnormal left hilar lymph node. Plan for outpatient PET scan.   Spoke with pt at bedside, who reports a general decline in health over the past month. Pt was very tangential at time of visit and unable to provide a lot of specific details, however, states "this all started when I was reported missing when I got lost trying to get to [facility]. If my car didn't run out of gas, I don't think they would have found me". Pt shares that she also stopped drinking alcohol around this time.   Pt shares that she has been eating about 2 meals per day PTA  (meat, starch, and vegetable) and consuming two Ensure supplements daily. Pt denies any changes to her intake and assures this RD that intake was similar compared to when she was abusing alcohol.   Pt endorses wt loss. She reports her UBW is around 145-150#, which she believes she last weighed around 3 months ago. This would equate to a 14.2% wt loss over the past 3 months, which is significant for time frame.   Pt reports that she has been eating "some" of her meals while admitted- she ate most of her meatloaf and bread yesterday and a pancake for breakfast. RD discussed importance of good meal and supplement intake to promote healing. Pt amenable to Ensure supplements.   Medications reviewed.   Labs reviewed.   NUTRITION - FOCUSED PHYSICAL EXAM:  Flowsheet Row Most Recent Value  Orbital Region Moderate depletion  Upper Arm Region Severe depletion  Thoracic and Lumbar Region Severe depletion  Buccal Region Mild depletion  Temple Region Moderate depletion  Clavicle Bone Region Moderate depletion  Clavicle and Acromion Bone Region Moderate depletion  Scapular Bone Region Severe depletion  Dorsal Hand Severe depletion  Patellar Region Severe depletion  Anterior Thigh Region Severe depletion  Posterior Calf Region Severe depletion  Edema (RD Assessment) None  Hair Reviewed  Eyes Reviewed  Mouth Reviewed  Skin Reviewed  Nails Reviewed       Diet Order:   Diet Order            Diet Heart Room service appropriate? No; Fluid consistency: Thin  Diet effective now           Diet -  low sodium heart healthy                 EDUCATION NEEDS:   Education needs have been addressed  Skin:  Skin Assessment: Reviewed RN Assessment  Last BM:  10/29/20  Height:   Ht Readings from Last 1 Encounters:  10/15/20 5\' 9"  (1.753 m)    Weight:   Wt Readings from Last 1 Encounters:  10/30/20 56.5 kg    Ideal Body Weight:  65.9 kg  BMI:  Body mass index is 18.39  kg/m.  Estimated Nutritional Needs:   Kcal:  8264-1583  Protein:  115-130 grams  Fluid:  > 2 L    Loistine Chance, RD, LDN, [place] Registered Dietitian II Certified Diabetes Care and Education Specialist Please refer to [facility] for RD and/or RD on-call/weekend/after hours pager

## 2020-10-30 NOTE — Telephone Encounter (Signed)
Great thanks. I suspect she will be dc soon  Garner Nash, DO [facility] 10/30/2020 12:29 PM

## 2020-10-30 NOTE — Plan of Care (Signed)
Problem: Activity: Goal: Ability to tolerate increased activity will improve 10/30/2020 1520 by Jeanann Lewandowsky D, LPN Outcome: Adequate for Discharge 10/30/2020 1235 by Jeanann Lewandowsky D, LPN Outcome: Progressing   Problem: Clinical Measurements: Goal: Ability to maintain a body temperature in the normal range will improve 10/30/2020 1520 by Jeanann Lewandowsky D, LPN Outcome: Adequate for Discharge 10/30/2020 1235 by Jeanann Lewandowsky D, LPN Outcome: Progressing   Problem: Respiratory: Goal: Ability to maintain adequate ventilation will improve 10/30/2020 1520 by Jeanann Lewandowsky D, LPN Outcome: Adequate for Discharge 10/30/2020 1235 by Jeanann Lewandowsky D, LPN Outcome: Progressing Goal: Ability to maintain a clear airway will improve 10/30/2020 1520 by Jeanann Lewandowsky D, LPN Outcome: Adequate for Discharge 10/30/2020 1235 by Chaney Born, LPN Outcome: Progressing   Problem: Education: Goal: Knowledge of General Education information will improve Description: Including pain rating scale, medication(s)/side effects and non-pharmacologic comfort measures 10/30/2020 1520 by Jeanann Lewandowsky D, LPN Outcome: Adequate for Discharge 10/30/2020 1235 by Jeanann Lewandowsky D, LPN Outcome: Progressing   Problem: Health Behavior/Discharge Planning: Goal: Ability to manage health-related needs will improve 10/30/2020 1520 by Jeanann Lewandowsky D, LPN Outcome: Adequate for Discharge 10/30/2020 1235 by Jeanann Lewandowsky D, LPN Outcome: Progressing   Problem: Clinical Measurements: Goal: Ability to maintain clinical measurements within normal limits will improve 10/30/2020 1520 by Jeanann Lewandowsky D, LPN Outcome: Adequate for Discharge 10/30/2020 1235 by Jeanann Lewandowsky D, LPN Outcome: Progressing Goal: Will remain free from infection 10/30/2020 1520 by Jeanann Lewandowsky D, LPN Outcome: Adequate for Discharge 10/30/2020 1235 by Jeanann Lewandowsky D, LPN Outcome: Progressing Goal: Diagnostic test results will improve 10/30/2020 1520 by Jeanann Lewandowsky D, LPN Outcome: Adequate  for Discharge 10/30/2020 1235 by Jeanann Lewandowsky D, LPN Outcome: Progressing Goal: Respiratory complications will improve 10/30/2020 1520 by Jeanann Lewandowsky D, LPN Outcome: Adequate for Discharge 10/30/2020 1235 by Jeanann Lewandowsky D, LPN Outcome: Progressing Goal: Cardiovascular complication will be avoided 10/30/2020 1520 by Jeanann Lewandowsky D, LPN Outcome: Adequate for Discharge 10/30/2020 1235 by Jeanann Lewandowsky D, LPN Outcome: Progressing   Problem: Activity: Goal: Risk for activity intolerance will decrease 10/30/2020 1520 by Jeanann Lewandowsky D, LPN Outcome: Adequate for Discharge 10/30/2020 1235 by Jeanann Lewandowsky D, LPN Outcome: Progressing   Problem: Nutrition: Goal: Adequate nutrition will be maintained 10/30/2020 1520 by Jeanann Lewandowsky D, LPN Outcome: Adequate for Discharge 10/30/2020 1235 by Jeanann Lewandowsky D, LPN Outcome: Progressing   Problem: Coping: Goal: Level of anxiety will decrease 10/30/2020 1520 by Jeanann Lewandowsky D, LPN Outcome: Adequate for Discharge 10/30/2020 1235 by Jeanann Lewandowsky D, LPN Outcome: Progressing   Problem: Elimination: Goal: Will not experience complications related to bowel motility 10/30/2020 1520 by Jeanann Lewandowsky D, LPN Outcome: Adequate for Discharge 10/30/2020 1235 by Jeanann Lewandowsky D, LPN Outcome: Progressing Goal: Will not experience complications related to urinary retention 10/30/2020 1520 by Jeanann Lewandowsky D, LPN Outcome: Adequate for Discharge 10/30/2020 1235 by Jeanann Lewandowsky D, LPN Outcome: Progressing   Problem: Pain Managment: Goal: General experience of comfort will improve 10/30/2020 1520 by Jeanann Lewandowsky D, LPN Outcome: Adequate for Discharge 10/30/2020 1235 by Jeanann Lewandowsky D, LPN Outcome: Progressing   Problem: Safety: Goal: Ability to remain free from injury will improve 10/30/2020 1520 by Jeanann Lewandowsky D, LPN Outcome: Adequate for Discharge 10/30/2020 1235 by Jeanann Lewandowsky D, LPN Outcome: Progressing   Problem: Skin Integrity: Goal: Risk for impaired skin integrity will  decrease 10/30/2020 1520 by Jeanann Lewandowsky D, LPN Outcome: Adequate for Discharge 10/30/2020 1235 by Chaney Born, LPN Outcome: Progressing

## 2020-10-30 NOTE — Progress Notes (Signed)
Heart Failure Nurse Navigator Progress Note  Pt screened for HV TOC needs related to HF. Pt admission symptoms more related to pulmonary/respiratory issues. Will continue to follow this admission to assess for Navigator needs.  Pricilla Holm, RN, BSN Heart Failure Nurse Navigator [phone]

## 2020-10-30 NOTE — TOC Transition Note (Signed)
Transition of Care [facility]) - CM/SW Discharge Note   Patient Details  Name: Dorothy Li MRN: [id number] Date of Birth: 08/12/1945  [facility]) CM/SW Contact:  Angelita Ingles, RN Phone Number: [phone]  10/30/2020, 3:27 PM   Clinical Narrative:    [facility] consulted for patient discharging with [facility] needs. Patient is currently active with [facility] for [facility]. Cory with [facility] has been made aware of patients discharge . No there needs noted at this time. TOC will sign off.   Final next level of care: [place] Barriers to Discharge: No Barriers Identified   Patient Goals and CMS Choice Patient states their goals for this hospitalization and ongoing recovery are:: Patient is ready to go home CMS Medicare.gov Compare Post Acute Care list provided to::  (patient is currently active with [facility]) Choice offered to / list presented to : NA  Discharge Placement                       Discharge Plan and Services                DME Arranged: N/A         HH Arranged: PT (currently active with [place]) [place]: [facility] (currently active) Date HH Agency Contacted: 10/30/20 Time [place]: 1527 Representative spoke with at [place]: [place] ([place]) Interventions     Readmission Risk Interventions Readmission Risk Prevention Plan 10/19/2020  Transportation Screening Complete  PCP or Specialist Appt within 3-5 Days Not Complete  Not Complete comments Per Neice, pt has appt on June 7.  She will attempt to move this appt up on Monday.  [place] or Home Care Consult Complete  Social Work Consult for [place] Planning/Counseling Complete  Palliative Care Screening Not Applicable  Some recent data might be hidden

## 2020-10-30 NOTE — Progress Notes (Signed)
AVS paperwork provided to pt and all paper work reviewed with pt. All questioned answered at this time. Pt discharged home with family.

## 2020-10-30 NOTE — Telephone Encounter (Signed)
Dr Valeta Harms I have pulled this order but scheduling will not schedule it till they are discharged from the hosp I will keep a check to see when that happens

## 2020-10-31 ENCOUNTER — Telehealth: Payer: Self-pay | Admitting: Pulmonary Disease

## 2020-10-31 NOTE — Telephone Encounter (Signed)
I have called cathy back and gave her the appt

## 2020-11-10 ENCOUNTER — Encounter (HOSPITAL_COMMUNITY): Payer: No Typology Code available for payment source

## 2020-11-11 ENCOUNTER — Observation Stay (HOSPITAL_COMMUNITY)
Admission: EM | Admit: 2020-11-11 | Discharge: 2020-11-12 | Disposition: A | Discharge status: Home / Self Care | Payer: No Typology Code available for payment source | Attending: Internal Medicine | Admitting: Internal Medicine

## 2020-11-11 ENCOUNTER — Other Ambulatory Visit: Payer: Self-pay

## 2020-11-11 ENCOUNTER — Encounter (HOSPITAL_COMMUNITY): Payer: Self-pay

## 2020-11-11 ENCOUNTER — Emergency Department (HOSPITAL_COMMUNITY): Payer: No Typology Code available for payment source

## 2020-11-11 ENCOUNTER — Observation Stay (HOSPITAL_COMMUNITY): Payer: No Typology Code available for payment source

## 2020-11-11 DIAGNOSIS — I5042 Chronic combined systolic (congestive) and diastolic (congestive) heart failure: Secondary | ICD-10-CM | POA: Diagnosis not present

## 2020-11-11 DIAGNOSIS — Z20822 Contact with and (suspected) exposure to covid-19: Secondary | ICD-10-CM | POA: Insufficient documentation

## 2020-11-11 DIAGNOSIS — I2510 Atherosclerotic heart disease of native coronary artery without angina pectoris: Secondary | ICD-10-CM | POA: Insufficient documentation

## 2020-11-11 DIAGNOSIS — Z87891 Personal history of nicotine dependence: Secondary | ICD-10-CM | POA: Insufficient documentation

## 2020-11-11 DIAGNOSIS — Z79899 Other long term (current) drug therapy: Secondary | ICD-10-CM | POA: Insufficient documentation

## 2020-11-11 DIAGNOSIS — Z95810 Presence of automatic (implantable) cardiac defibrillator: Secondary | ICD-10-CM | POA: Insufficient documentation

## 2020-11-11 DIAGNOSIS — I110 Hypertensive heart disease with heart failure: Secondary | ICD-10-CM | POA: Insufficient documentation

## 2020-11-11 DIAGNOSIS — Z7982 Long term (current) use of aspirin: Secondary | ICD-10-CM | POA: Insufficient documentation

## 2020-11-11 DIAGNOSIS — Z951 Presence of aortocoronary bypass graft: Secondary | ICD-10-CM | POA: Insufficient documentation

## 2020-11-11 DIAGNOSIS — R42 Dizziness and giddiness: Secondary | ICD-10-CM

## 2020-11-11 DIAGNOSIS — Z91040 Latex allergy status: Secondary | ICD-10-CM | POA: Diagnosis not present

## 2020-11-11 DIAGNOSIS — Z853 Personal history of malignant neoplasm of breast: Secondary | ICD-10-CM | POA: Insufficient documentation

## 2020-11-11 DIAGNOSIS — R52 Pain, unspecified: Secondary | ICD-10-CM

## 2020-11-11 DIAGNOSIS — E860 Dehydration: Principal | ICD-10-CM

## 2020-11-11 LAB — CBC WITH DIFFERENTIAL/PLATELET
Abs Immature Granulocytes: 0.02 10*3/uL (ref 0.00–0.07)
Basophils Absolute: 0.1 10*3/uL (ref 0.0–0.1)
Basophils Relative: 2 %
Eosinophils Absolute: 0.1 10*3/uL (ref 0.0–0.5)
Eosinophils Relative: 2 %
HCT: 32.3 % — ABNORMAL LOW (ref 36.0–46.0)
Hemoglobin: 10.6 g/dL — ABNORMAL LOW (ref 12.0–15.0)
Immature Granulocytes: 0 %
Lymphocytes Relative: 27 %
Lymphs Abs: 1.8 10*3/uL (ref 0.7–4.0)
MCH: 34 pg (ref 26.0–34.0)
MCHC: 32.8 g/dL (ref 30.0–36.0)
MCV: 103.5 fL — ABNORMAL HIGH (ref 80.0–100.0)
Monocytes Absolute: 0.7 10*3/uL (ref 0.1–1.0)
Monocytes Relative: 10 %
Neutro Abs: 3.8 10*3/uL (ref 1.7–7.7)
Neutrophils Relative %: 59 %
Platelets: 328 10*3/uL (ref 150–400)
RBC: 3.12 MIL/uL — ABNORMAL LOW (ref 3.87–5.11)
RDW: 13 % (ref 11.5–15.5)
WBC: 6.5 10*3/uL (ref 4.0–10.5)
nRBC: 0 % (ref 0.0–0.2)

## 2020-11-11 LAB — COMPREHENSIVE METABOLIC PANEL
ALT: 14 U/L (ref 0–44)
AST: 24 U/L (ref 15–41)
Albumin: 2.7 g/dL — ABNORMAL LOW (ref 3.5–5.0)
Alkaline Phosphatase: 71 U/L (ref 38–126)
Anion gap: 6 (ref 5–15)
BUN: 9 mg/dL (ref 8–23)
CO2: 25 mmol/L (ref 22–32)
Calcium: 9 mg/dL (ref 8.9–10.3)
Chloride: 108 mmol/L (ref 98–111)
Creatinine, Ser: 0.82 mg/dL (ref 0.44–1.00)
GFR, Estimated: >60 mL/min (ref >60)
Glucose, Bld: 101 mg/dL — ABNORMAL HIGH (ref 70–99)
Potassium: 3.9 mmol/L (ref 3.5–5.1)
Sodium: 139 mmol/L (ref 135–145)
Total Bilirubin: 0.1 mg/dL — ABNORMAL LOW (ref 0.3–1.2)
Total Protein: 6.6 g/dL (ref 6.5–8.1)

## 2020-11-11 LAB — LACTIC ACID, PLASMA
Lactic Acid, Venous: 2.1 mmol/L (ref 0.5–1.9)
Lactic Acid, Venous: 1.8 mmol/L (ref 0.5–1.9)

## 2020-11-11 LAB — SARS CORONAVIRUS 2 (TAT 6-24 HRS): SARS Coronavirus 2: NEGATIVE

## 2020-11-11 LAB — CBG MONITORING, ED: Glucose-Capillary: 91 mg/dL (ref 70–99)

## 2020-11-11 LAB — POC OCCULT BLOOD, ED: Fecal Occult Bld: NEGATIVE

## 2020-11-11 LAB — TROPONIN I (HIGH SENSITIVITY)
Troponin I (High Sensitivity): 9 ng/L (ref <18)
Troponin I (High Sensitivity): 11 ng/L (ref <18)

## 2020-11-11 LAB — LIPASE, BLOOD: Lipase: 116 U/L — ABNORMAL HIGH (ref 11–51)

## 2020-11-11 MED ORDER — ACETAMINOPHEN 325 MG PO TABS: 650.0000 mg | ORAL_TABLET | Freq: Four times a day (QID) | ORAL | Status: DC | PRN

## 2020-11-11 MED ORDER — PANTOPRAZOLE SODIUM 40 MG PO TBEC
40.0000 mg | DELAYED_RELEASE_TABLET | Freq: Two times a day (BID) | ORAL | Status: DC
  Administered 2020-11-11 – 2020-11-12 (×2): 40 mg via ORAL
  Filled 2020-11-11 (×2): qty 1

## 2020-11-11 MED ORDER — SERTRALINE HCL 100 MG PO TABS
100.0000 mg | ORAL_TABLET | Freq: Every day | ORAL | Status: DC
  Administered 2020-11-12: 100 mg via ORAL
  Filled 2020-11-11: qty 1

## 2020-11-11 MED ORDER — RISAQUAD PO CAPS
1.0000 | ORAL_CAPSULE | Freq: Every day | ORAL | Status: DC
  Administered 2020-11-12: 1 via ORAL
  Filled 2020-11-11: qty 1

## 2020-11-11 MED ORDER — ENOXAPARIN SODIUM 30 MG/0.3ML IJ SOSY
30.0000 mg | PREFILLED_SYRINGE | INTRAMUSCULAR | Status: DC
  Administered 2020-11-11: 30 mg via SUBCUTANEOUS
  Filled 2020-11-11: qty 0.3

## 2020-11-11 MED ORDER — SODIUM CHLORIDE 0.9 % IV BOLUS
1000.0000 mL | Freq: Once | INTRAVENOUS | Status: AC
  Administered 2020-11-11: 1000 mL via INTRAVENOUS

## 2020-11-11 MED ORDER — ASPIRIN EC 81 MG PO TBEC
81.0000 mg | DELAYED_RELEASE_TABLET | Freq: Every day | ORAL | Status: DC
  Administered 2020-11-12: 81 mg via ORAL
  Filled 2020-11-11: qty 1

## 2020-11-11 MED ORDER — SODIUM CHLORIDE 0.9 % IV SOLN: Freq: Once | INTRAVENOUS | Status: DC

## 2020-11-11 MED ORDER — VITAMIN D 25 MCG (1000 UNIT) PO TABS
1000.0000 [IU] | ORAL_TABLET | Freq: Every day | ORAL | Status: DC
  Administered 2020-11-11 – 2020-11-12 (×2): 1000 [IU] via ORAL
  Filled 2020-11-11 (×2): qty 1

## 2020-11-11 MED ORDER — ENSURE ENLIVE PO LIQD
237.0000 mL | Freq: Three times a day (TID) | ORAL | Status: DC
  Administered 2020-11-11 – 2020-11-12 (×2): 237 mL via ORAL
  Filled 2020-11-11: qty 237

## 2020-11-11 MED ORDER — POLYETHYLENE GLYCOL 3350 17 G PO PACK: 17.0000 g | PACK | Freq: Every day | ORAL | Status: DC | PRN

## 2020-11-11 MED ORDER — CALCIUM-VITAMIN D-VITAMIN K 750-500-40 MG-UNT-MCG PO TABS: 1.0000 | ORAL_TABLET | Freq: Every day | ORAL | Status: DC

## 2020-11-11 MED ORDER — OXYCODONE HCL 5 MG PO TABS
5.0000 mg | ORAL_TABLET | ORAL | Status: DC | PRN
Start: 2020-11-11 — End: 2020-11-12
  Administered 2020-11-12: 5 mg via ORAL
  Filled 2020-11-11: qty 1

## 2020-11-11 MED ORDER — BACID PO TABS
ORAL_TABLET | Freq: Every day | ORAL | Status: DC
  Filled 2020-11-11 (×2): qty 1

## 2020-11-11 MED ORDER — LIDOCAINE 5 % EX PTCH
1.0000 | MEDICATED_PATCH | CUTANEOUS | Status: DC
  Administered 2020-11-11: 1 via TRANSDERMAL
  Filled 2020-11-11: qty 1

## 2020-11-11 MED ORDER — TRAZODONE HCL 50 MG PO TABS
50.0000 mg | ORAL_TABLET | Freq: Every day | ORAL | Status: DC
  Administered 2020-11-11: 50 mg via ORAL
  Filled 2020-11-11: qty 1

## 2020-11-11 NOTE — ED Triage Notes (Signed)
Pt arrived POV c/o dizziness since yesterday evening. Pt states she gets dizzy and light headed when changing positions. Pt denies any pain. Pt's BP lying down was 106/66 and then when standing pt's BP was 73/52.

## 2020-11-11 NOTE — H&P (Addendum)
History and Physical    Dorothy Li TIR:[id number] DOB: [birth year] DOA: 11/11/2020  PCP: [facility] (Confirm with patient/family/NH records and if not entered, this has to be entered at [facility] point of entry) Patient coming from: Home  I have personally briefly reviewed patient's old medical records in [place]  Chief Complaint: Lightheaded, left sided chest pain  HPI: Dorothy Li is a 75 y.o. female with medical history significant of breast cancer status post lumpectomy, on hormone therapy, hypertension, CAD, chronic systolic CHF status post AICD, recent T12-L1 endplate fracture, osteoporosis presented with persistent left-sided flank/chest pain, poor oral intake and dehydration.  According to family, patient stopped using the TLSO brace for ambulation for her T12, L1 fracture last week.  The patient developed more left-sided chest and flank pain with upper body movement or deep breath.  This morning, physical therapy technician went patient home and found patient lethargic, she immediately took patient blood pressure systolic was in the 67Y and called 911. ED Course: Blood pressure largely improved after 1 L of IV boluses.  Patient much awake.  Continues to complain left-sided rib cage pain.  WBC, kidney function within normal limits.  Review of Systems: As per HPI otherwise 14 point review of systems negative.    Past Medical History:  Diagnosis Date  . ARF (acute renal failure) ([place])    in setting dehydration with ACE I use   . Arthritis   . 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 CAD (coronary artery disease)    LHC (4/11) showed 75% ostial LM stenosis with significant damping upon catheter engagement, 60-70% mLAD, 60% ostial D1, EF 30%.  Patient had CABG with LIMA-LAD and SVG-OM2.   . Cardiomyopathy    Echo (4/11) with EF 25%, diffuse hypokinesis, moderate LV dilation, decreased RV  systolic function but normal RV  size, PA systolic pressure 35 mmHg. Lexiscan myoview (4/11): EF 29%,  diffuse hypokinesis, basal anteroseptal and apical scar, no ischemia.  The cardiomyopathy is likely due to a combination of CAD (LM stenosis) and ETOH abuse.   . Carotid stenosis    carotid dopplers (1/95) with 09-32% LICA stenosis.  Carotid dopplers (67/12): 45-80%  LICA stenosis.  [name] ETOH abuse     Elevated AST, mildly elevated INR (? component of cirrhosis). Has now quit ETOH.   . HTN (hypertension)   . Hyperlipemia   . 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 Nephrolithiasis   . Rash    from tape   . S/P radiation therapy 03/22/12 - 05/02/12   Right Breast: 50 gray/25Fractions with Boost of 10gray/5 Fractions  . Stress fracture of right foot 01/17/12   Dr. Vangie Bicker  . Syncope    none since ICD placement  . Use of anastrozole (Arimidex) 05/12/12  . VT (ventricular tachycardia) (HCC)    Dual chamber Medtronic ICD placed 7/11    Past Surgical History:  Procedure Laterality Date  . ABDOMINAL HYSTERECTOMY     TAH-BSO, fibroids, age 74  . BREAST BIOPSY  12/08/11   Right Breast, Upper Outer Quadrant: Invasive Ductal:DCIS  . BREAST LUMPECTOMY  01-19-12   right lumpectomy/  . CARDIAC CATHETERIZATION    . COLONOSCOPY  04/11/2012   Procedure: COLONOSCOPY;  Surgeon: Jerene Bears, MD;  Location: [facility];  Service: Gastroenterology;  Laterality: N/A;  . CORONARY ARTERY BYPASS GRAFT  4/11    x 2  .  ICD implantation  2011   ICD-Medtronic    Remote - No /  Hx of VT, LU chest     reports that she quit smoking about 11 years ago. Her smoking use included cigarettes. She has a 40.00 pack-year smoking history. She has never used smokeless tobacco. She reports current alcohol use. She reports that she does not use drugs.  Allergies  Allergen Reactions  . Penicillins Other (See Comments)    "blacks out" per pt   . Latex Hives and Other (See Comments)    Gloves  . Tape Itching  and Rash    Bandage on breast from bx caused rash    Family History  Problem Relation Age of Onset  . Heart disease Sister        Had surgery  . Heart failure Sister   . Diabetes Mother   . Breast cancer Cousin   . Colon cancer Neg Hx      Prior to Admission medications   Medication Sig Start Date End Date Taking? Authorizing Provider  acetaminophen (TYLENOL) 325 MG tablet Take 650 mg by mouth every 6 (six) hours as needed for moderate pain or fever.   Yes [provider]  alendronate (FOSAMAX) 70 MG tablet Take 70 mg by mouth every Monday. Take with a full glass of water on an empty stomach.   Yes [provider]  amoxicillin-clavulanate (AUGMENTIN) 875-125 MG tablet Take 1 tablet by mouth 2 (two) times daily.   Yes [provider]  aspirin EC 81 MG tablet Take 1 tablet (81 mg total) by mouth daily. 04/08/11  Yes Larey Dresser, MD  Calcium-Vitamin D-Vitamin K [phone] MG-UNT-MCG TABS Take 1 tablet by mouth daily.   Yes [provider]  carvedilol (COREG) 6.25 MG tablet Take 6.25 mg by mouth 2 (two) times daily with a meal.   Yes [provider]  Cholecalciferol (VITAMIN D3) 3000 UNITS TABS Take 3,000 Units by mouth daily.   Yes [provider]  ibuprofen (ADVIL) 200 MG tablet Take 200 mg by mouth every 6 (six) hours as needed for headache or mild pain.   Yes [provider]  LACTOBACILLUS PROBIOTIC PO Take 1 capsule by mouth daily.   Yes [provider]  Multiple Vitamins-Minerals (MULTIVITAMIN WITH MINERALS) tablet Take 1 tablet by mouth daily.   Yes [provider]  pantoprazole (PROTONIX) 40 MG tablet Take 40 mg by mouth 2 (two) times daily before a meal.   Yes [provider]  sertraline (ZOLOFT) 100 MG tablet Take 100 mg by mouth daily.   Yes [provider]  traZODone (DESYREL) 50 MG tablet Take 50 mg by mouth at bedtime. 06/02/15  Yes [provider]  anastrozole  (ARIMIDEX) 1 MG tablet Take 1 tablet (1 mg total) by mouth daily. Patient not taking: No sig reported 07/27/13   Marcy Panning, MD  atorvastatin (LIPITOR) 80 MG tablet Take 40 mg by mouth at bedtime. Patient not taking: No sig reported    [provider]  feeding supplement (ENSURE ENLIVE / ENSURE PLUS) LIQD Take 237 mLs by mouth 3 (three) times daily between meals. Patient not taking: Reported on 11/11/2020 10/30/20   Antonieta Pert, MD    Physical Exam: Vitals:   11/11/20 1400 11/11/20 1430 11/11/20 1530 11/11/20 1600  BP: (!) 145/76 135/70 122/69 125/65  Pulse: 67 74 94 99  Resp: 18 18 20  (!) 22  Temp:      TempSrc:  SpO2: 90% 95% 92% 92%  Weight:      Height:        Constitutional: NAD, calm, comfortable Vitals:   11/11/20 1400 11/11/20 1430 11/11/20 1530 11/11/20 1600  BP: (!) 145/76 135/70 122/69 125/65  Pulse: 67 74 94 99  Resp: 18 18 20  (!) 22  Temp:      TempSrc:      SpO2: 90% 95% 92% 92%  Weight:      Height:       Eyes: PERRL, lids and conjunctivae normal ENMT: Mucous membranes are moist. Posterior pharynx clear of any exudate or lesions.Normal dentition.  Neck: normal, supple, no masses, no thyromegaly Respiratory: clear to auscultation bilaterally, no wheezing, no crackles. Normal respiratory effort. No accessory muscle use.  Left lower rib cage tenderness Cardiovascular: Regular rate and rhythm, no murmurs / rubs / gallops. No extremity edema. 2+ pedal pulses. No carotid bruits.  Abdomen: no tenderness, no masses palpated. No hepatosplenomegaly. Bowel sounds positive.  Musculoskeletal: no clubbing / cyanosis. No joint deformity upper and lower extremities. Good ROM, no contractures. Normal muscle tone.  Skin: no rashes, lesions, ulcers. No induration Neurologic: CN 2-12 grossly intact. Sensation intact, DTR normal. Strength 5/5 in all 4.  Psychiatric: Normal judgment and insight. Alert and oriented x 3. Normal mood.     Labs on Admission: I have  personally reviewed following labs and imaging studies  CBC: Recent Labs  Lab 11/11/20 1209  WBC 6.5  NEUTROABS 3.8  HGB 10.6*  HCT 32.3*  MCV 103.5*  PLT 147   Basic Metabolic Panel: Recent Labs  Lab 11/11/20 1209  NA 139  K 3.9  CL 108  CO2 25  GLUCOSE 101*  BUN 9  CREATININE 0.82  CALCIUM 9.0   GFR: Estimated Creatinine Clearance: 53.7 mL/min (by C-G formula based on SCr of 0.82 mg/dL). Liver Function Tests: Recent Labs  Lab 11/11/20 1209  AST 24  ALT 14  ALKPHOS 71  BILITOT 0.1*  PROT 6.6  ALBUMIN 2.7*   Recent Labs  Lab 11/11/20 1209  LIPASE 116*   No results for input(s): AMMONIA in the last 168 hours. Coagulation Profile: No results for input(s): INR, PROTIME in the last 168 hours. Cardiac Enzymes: No results for input(s): CKTOTAL, CKMB, CKMBINDEX, TROPONINI in the last 168 hours. BNP (last 3 results) No results for input(s): PROBNP in the last 8760 hours. HbA1C: No results for input(s): HGBA1C in the last 72 hours. CBG: Recent Labs  Lab 11/11/20 1234  GLUCAP 91   Lipid Profile: No results for input(s): CHOL, HDL, LDLCALC, TRIG, CHOLHDL, LDLDIRECT in the last 72 hours. Thyroid Function Tests: No results for input(s): TSH, T4TOTAL, FREET4, T3FREE, THYROIDAB in the last 72 hours. Anemia Panel: No results for input(s): VITAMINB12, FOLATE, FERRITIN, TIBC, IRON, RETICCTPCT in the last 72 hours. Urine analysis:    Component Value Date/Time   COLORURINE AMBER (A) 10/29/2020 0210   APPEARANCEUR HAZY (A) 10/29/2020 0210   LABSPEC 1.020 10/29/2020 0210   PHURINE 8.0 10/29/2020 0210   GLUCOSEU NEGATIVE 10/29/2020 0210   HGBUR NEGATIVE 10/29/2020 0210   BILIRUBINUR NEGATIVE 10/29/2020 0210   KETONESUR NEGATIVE 10/29/2020 0210   PROTEINUR NEGATIVE 10/29/2020 0210   UROBILINOGEN 0.2 01/12/2012 0842   NITRITE NEGATIVE 10/29/2020 0210   LEUKOCYTESUR NEGATIVE 10/29/2020 0210    Radiological Exams on Admission: CT Head Wo Contrast  Result  Date: 11/11/2020 CLINICAL DATA:  Near syncope.  Dizziness. EXAM: CT HEAD WITHOUT CONTRAST TECHNIQUE: Contiguous axial images were  obtained from the base of the skull through the vertex without intravenous contrast. COMPARISON:  CT head dated October 15, 2020. FINDINGS: Brain: No evidence of acute infarction, hemorrhage, hydrocephalus, extra-axial collection or mass lesion/mass effect. Stable atrophy and chronic microvascular ischemic changes. Vascular: Calcified atherosclerosis at the skullbase. No hyperdense vessel. Skull: Normal. Negative for fracture or focal lesion. Sinuses/Orbits: No acute finding. Chronic retention cyst in the right maxillary sinus. Other: None. IMPRESSION: 1. No acute intracranial abnormality. 2. Stable atrophy and chronic microvascular ischemic changes. Electronically Signed   By: Titus Dubin M.D.   On: 11/11/2020 15:13   DG Chest Portable 1 View  Result Date: 11/11/2020 CLINICAL DATA:  Near syncope EXAM: PORTABLE CHEST 1 VIEW COMPARISON:  Chest radiograph dated 10/29/2020. FINDINGS: The heart is enlarged. Vascular calcifications are seen in the aortic arch. A left subclavian approach cardiac device is redemonstrated. Median sternotomy wires are seen. The lungs are clear. Degenerative changes are seen in the spine. IMPRESSION: No active pulmonary disease.  Cardiomegaly. Aortic Atherosclerosis (ICD10-I70.0). Electronically Signed   By: Zerita Boers M.D.   On: 11/11/2020 13:01    EKG: Independently reviewed.  Prolonged QTC.  Assessment/Plan Active Problems:   Dehydration  (please populate well all problems here in Problem List. (For example, if patient is on BP meds at home and you resume or decide to hold them, it is a problem that needs to be her. Same for CAD, COPD, HLD and so on)  Dehydration -Improved, blood pressure stable, decided not to continue maintenance IV fluids given baseline history of systolic CHF.[name]  Hypotension -Secondary to dehydration, no symptoms or  signs of active infection, blood pressure improved with IV fluids.  Chronic systolic CHF -Signs of hypovolemia and dehydration, will not continue maintenance IV fluid given blood pressure already recovered from hypotension.  Left rib cage pain -Check rib x-ray, lidocaine patch.  Moderate protein calorie malnutrition -Continue protein supplement  Breast CA -Follow-up with oncologist at [place], recheck EKG tomorrow.  Anxiety/depression -Continue SSRI.  Chronic T12/L1 fracture -PT evaluation, encouraged to use TLSO brace  DVT prophylaxis: Lovenox  code Status: Full code Family Communication: Husband on the phone Disposition Plan: Expect less than 2 midnight hospital stay Consults called: None Admission status: MedSurg observation   Lequita Halt MD [facility] Pager [phone]  11/11/2020, 4:36 PM

## 2020-11-11 NOTE — ED Provider Notes (Signed)
[facility] EMERGENCY DEPARTMENT Provider Note   CSN: [id number] Arrival date & time: 11/11/20  1147     History Chief Complaint  Patient presents with  . Dizziness    Dorothy Li is a 75 y.o. female.  The history is provided by the patient.  Dizziness Quality:  Lightheadedness Severity:  Mild Onset quality:  Gradual Duration:  2 days Timing:  Intermittent Progression:  Waxing and waning Chronicity:  New Context: standing up   Relieved by:  Being still Associated symptoms: no blood in stool, no chest pain, no diarrhea, no headaches, no hearing loss, no nausea, no palpitations, no shortness of breath, no syncope, no tinnitus, no vision changes and no vomiting        Past Medical History:  Diagnosis Date  . ARF (acute renal failure) ([place])    in setting dehydration with ACE I use   . Arthritis   . 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 CAD (coronary artery disease)    LHC (4/11) showed 75% ostial LM stenosis with significant damping upon catheter engagement, 60-70% mLAD, 60% ostial D1, EF 30%.  Patient had CABG with LIMA-LAD and SVG-OM2.   . Cardiomyopathy    Echo (4/11) with EF 25%, diffuse hypokinesis, moderate LV dilation, decreased RV  systolic function but normal RV size, PA systolic pressure 35 mmHg. Lexiscan myoview (4/11): EF 29%,  diffuse hypokinesis, basal anteroseptal and apical scar, no ischemia.  The cardiomyopathy is likely due to a combination of CAD (LM stenosis) and ETOH abuse.   . Carotid stenosis    carotid dopplers (3/29) with 92-42% LICA stenosis.  Carotid dopplers (68/34): 19-62%  LICA stenosis.  [name] ETOH abuse     Elevated AST, mildly elevated INR (? component of cirrhosis). Has now quit ETOH.   . HTN (hypertension)   . Hyperlipemia   . 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 Nephrolithiasis   . Rash    from tape   . S/P radiation  therapy 03/22/12 - 05/02/12   Right Breast: 50 gray/25Fractions with Boost of 10gray/5 Fractions  . Stress fracture of right foot 01/17/12   Dr. Vangie Bicker  . Syncope    none since ICD placement  . Use of anastrozole (Arimidex) 05/12/12  . VT (ventricular tachycardia) ([facility])    Dual chamber Medtronic ICD placed 7/11    Patient Active Problem List   Diagnosis Date Noted  . Protein-calorie malnutrition, severe 10/30/2020  . Pneumonia 10/29/2020  . CAD (coronary artery disease) 10/29/2020  . Prolonged QT interval 10/29/2020  . Parapneumonic effusion 10/29/2020  . Multiple falls 10/16/2020  . Hypokalemia 10/15/2020  . Hypomagnesemia 10/15/2020  . EtOH dependence ([place]) 10/15/2020  . FTT (failure to thrive) in adult 10/15/2020  . Colon polyps 04/11/2012  . Malignant neoplasm of upper-outer quadrant of female breast ([place]) 12/15/2011  . Breast cancer, right breast ([place]) 12/08/2011  . Breast cancer ([place]) 12/08/2011  . ARF (acute renal failure) ([place]) 02/04/2011  . Hypotension 01/26/2011  . Pure hypercholesterolemia 07/30/2010  . 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  . CAROTID ARTERY STENOSIS 02/11/2010  . CORONARY ATHEROSLERO UNSPEC TYPE BYPASS GRAFT 11/11/2009  . CAROTID BRUIT 11/11/2009  . Nonischemic cardiomyopathy/ CAD-CABG 10/08/2009  . Chronic combined systolic and diastolic CHF (congestive heart failure) ([place]) 10/08/2009  . VENTRICULAR TACHYCARDIA 09/16/2009  . Essential hypertension 09/05/2009  . SYNCOPE 09/05/2009    Past Surgical  History:  Procedure Laterality Date  . ABDOMINAL HYSTERECTOMY     TAH-BSO, fibroids, age 57  . BREAST BIOPSY  12/08/11   Right Breast, Upper Outer Quadrant: Invasive Ductal:DCIS  . BREAST LUMPECTOMY  01-19-12   right lumpectomy/  . CARDIAC CATHETERIZATION    . COLONOSCOPY  04/11/2012   Procedure: COLONOSCOPY;  Surgeon: Jerene Bears, MD;  Location: [facility];  Service: Gastroenterology;  Laterality: N/A;  . CORONARY  ARTERY BYPASS GRAFT  4/11    x 2  . ICD implantation  2011   ICD-Medtronic    Remote - No /  Hx of VT, LU chest     OB History    Gravida  1   Para      Term      Preterm      AB      Living        SAB      IAB      Ectopic      Multiple      Live Births           Obstetric Comments  3 pregnancies and 0 births        Family History  Problem Relation Age of Onset  . Heart disease Sister        Had surgery  . Heart failure Sister   . Diabetes Mother   . Breast cancer Cousin   . Colon cancer Neg Hx     Social History   Tobacco Use  . Smoking status: Former Smoker    Packs/day: 1.00    Years: 40.00    Pack years: 40.00    Types: Cigarettes    Quit date: 10/08/2009    Years since quitting: 11.1  . Smokeless tobacco: Never Used  Substance Use Topics  . Alcohol use: Yes    Comment: Prior hx 2 drinks daily.  quit 09/2009  . Drug use: No    Home Medications Prior to Admission medications   Medication Sig Start Date End Date Taking? Authorizing Provider  acetaminophen (TYLENOL) 325 MG tablet Take 650 mg by mouth every 6 (six) hours as needed for moderate pain or fever.    [provider]  alendronate (FOSAMAX) 70 MG tablet Take 70 mg by mouth every Monday. Take with a full glass of water on an empty stomach.    [provider]  amoxicillin (AMOXIL) 500 MG capsule Take 1,000 mg by mouth 2 (two) times daily. 10/11/20   [provider]  anastrozole (ARIMIDEX) 1 MG tablet Take 1 tablet (1 mg total) by mouth daily. Patient not taking: Reported on 10/29/2020 07/27/13   Dorothy Panning, MD  aspirin EC 81 MG tablet Take 1 tablet (81 mg total) by mouth daily. 04/08/11   Larey Dresser, MD  atorvastatin (LIPITOR) 80 MG tablet Take 40 mg by mouth at bedtime. Patient not taking: Reported on 10/29/2020    [provider]  Calcium-Vitamin D-Vitamin K 750-500-40 MG-UNT-MCG TABS Take 1 tablet by mouth daily.    [provider]   carvedilol (COREG) 6.25 MG tablet Take 6.25 mg by mouth 2 (two) times daily with a meal.    [provider]  Cholecalciferol (VITAMIN D3) 3000 UNITS TABS Take 3,000 Units by mouth daily.    [provider]  erythromycin base (E-MYCIN) 500 MG tablet Take 500 mg by mouth 2 (two) times daily. 10/11/20   [provider]  feeding supplement (ENSURE ENLIVE / ENSURE PLUS) LIQD  Take 237 mLs by mouth 3 (three) times daily between meals. 10/30/20   Antonieta Pert, MD  hydrALAZINE (APRESOLINE) 50 MG tablet Take 25 mg by mouth 3 (three) times daily. Patient not taking: Reported on 10/29/2020    [provider]  ibuprofen (ADVIL) 200 MG tablet Take 200 mg by mouth every 6 (six) hours as needed for headache or mild pain.    [provider]  LACTOBACILLUS PROBIOTIC PO Take 1 capsule by mouth daily.    [provider]  metroNIDAZOLE (FLAGYL) 250 MG tablet Take 1,000 mg by mouth 2 (two) times daily. 10/11/20   [provider]  Multiple Vitamins-Minerals (MULTIVITAMIN WITH MINERALS) tablet Take 1 tablet by mouth daily.    [provider]  pantoprazole (PROTONIX) 40 MG tablet Take 40 mg by mouth 2 (two) times daily before a meal.    [provider]  sertraline (ZOLOFT) 100 MG tablet Take 100 mg by mouth daily.    [provider]  traZODone (DESYREL) 50 MG tablet Take 50 mg by mouth at bedtime. 06/02/15   [provider]  vitamin B-12 (CYANOCOBALAMIN) 1000 MCG tablet Take 1,000 mcg by mouth every other day. Patient not taking: Reported on 10/29/2020    [provider]    Allergies    Penicillins, Latex, and Tape  Review of Systems   Review of Systems  Constitutional: Negative for chills and fever.  HENT: Negative for ear pain, hearing loss, sore throat and tinnitus.   Eyes: Negative for pain and visual disturbance.  Respiratory: Negative for cough and shortness of breath.   Cardiovascular: Negative for chest  pain, palpitations and syncope.  Gastrointestinal: Negative for abdominal pain, blood in stool, diarrhea, nausea and vomiting.  Genitourinary: Negative for dysuria and hematuria.  Musculoskeletal: Negative for arthralgias and back pain.  Skin: Negative for color change and rash.  Neurological: Positive for dizziness and light-headedness. Negative for tremors, seizures, syncope, facial asymmetry, speech difficulty, numbness and headaches.  All other systems reviewed and are negative.   Physical Exam Updated Vital Signs  ED Triage Vitals [11/11/20 1151]  Enc Vitals Group     BP (!) 70/51     Pulse Rate (!) 104     Resp 20     Temp 98.1 F (36.7 C)     Temp Source Oral     SpO2 100 %     Weight      Height      Head Circumference      Peak Flow      Pain Score      Pain Loc      Pain Edu?      Excl. in [place]?     Physical Exam Vitals and nursing note reviewed.  Constitutional:      General: She is not in acute distress.    Appearance: She is well-developed. She is cachectic. She is not ill-appearing.  HENT:     Head: Normocephalic and atraumatic.  Eyes:     Conjunctiva/sclera: Conjunctivae normal.  Cardiovascular:     Rate and Rhythm: Normal rate and regular rhythm.     Pulses: Normal pulses.     Heart sounds: Normal heart sounds. No murmur heard.   Pulmonary:     Effort: Pulmonary effort is normal. No respiratory distress.     Breath sounds: Normal breath sounds.  Abdominal:     Palpations: Abdomen is soft.     Tenderness: There is no abdominal tenderness.  Genitourinary:  Rectum: Guaiac result negative.     Comments: Stool grossly brown  Musculoskeletal:     Cervical back: Normal range of motion and neck supple.  Skin:    General: Skin is warm and dry.  Neurological:     General: No focal deficit present.     Mental Status: She is alert and oriented to person, place, and time.     Cranial Nerves: No cranial nerve deficit.     Sensory: No sensory deficit.      Motor: No weakness.     Coordination: Coordination normal.     Gait: Gait normal.     ED Results / Procedures / Treatments   Labs (all labs ordered are listed, but only abnormal results are displayed) Labs Reviewed  CBC WITH DIFFERENTIAL/PLATELET - Abnormal; Notable for the following components:      Result Value   RBC 3.12 (*)    Hemoglobin 10.6 (*)    HCT 32.3 (*)    MCV 103.5 (*)    All other components within normal limits  COMPREHENSIVE METABOLIC PANEL - Abnormal; Notable for the following components:   Glucose, Bld 101 (*)    Albumin 2.7 (*)    Total Bilirubin 0.1 (*)    All other components within normal limits  LIPASE, BLOOD - Abnormal; Notable for the following components:   Lipase 116 (*)    All other components within normal limits  LACTIC ACID, PLASMA - Abnormal; Notable for the following components:   Lactic Acid, Venous 2.1 (*)    All other components within normal limits  URINE CULTURE  CULTURE, BLOOD (ROUTINE X 2)  CULTURE, BLOOD (ROUTINE X 2)  URINALYSIS, ROUTINE W REFLEX MICROSCOPIC  LACTIC ACID, PLASMA  POC OCCULT BLOOD, ED  CBG MONITORING, ED  TROPONIN I (HIGH SENSITIVITY)  TROPONIN I (HIGH SENSITIVITY)    EKG EKG Interpretation  Date/Time:  Tuesday Nov 11 2020 12:07:53 EDT Ventricular Rate:  86 PR Interval:  99 QRS Duration: 120 QT Interval:  510 QTC Calculation: 611 R Axis:   -37 Text Interpretation: Sinus rhythm Atrial premature complex Short PR interval Nonspecific IVCD with LAD Abnormal T, consider ischemia, diffuse leads Confirmed by Lennice Sites ([id number]) on 11/11/2020 12:12:23 PM   Radiology CT Head Wo Contrast  Result Date: 11/11/2020 CLINICAL DATA:  Near syncope.  Dizziness. EXAM: CT HEAD WITHOUT CONTRAST TECHNIQUE: Contiguous axial images were obtained from the base of the skull through the vertex without intravenous contrast. COMPARISON:  CT head dated October 15, 2020. FINDINGS: Brain: No evidence of acute infarction, hemorrhage,  hydrocephalus, extra-axial collection or mass lesion/mass effect. Stable atrophy and chronic microvascular ischemic changes. Vascular: Calcified atherosclerosis at the skullbase. No hyperdense vessel. Skull: Normal. Negative for fracture or focal lesion. Sinuses/Orbits: No acute finding. Chronic retention cyst in the right maxillary sinus. Other: None. IMPRESSION: 1. No acute intracranial abnormality. 2. Stable atrophy and chronic microvascular ischemic changes. Electronically Signed   By: Titus Dubin M.D.   On: 11/11/2020 15:13   DG Chest Portable 1 View  Result Date: 11/11/2020 CLINICAL DATA:  Near syncope EXAM: PORTABLE CHEST 1 VIEW COMPARISON:  Chest radiograph dated 10/29/2020. FINDINGS: The heart is enlarged. Vascular calcifications are seen in the aortic arch. A left subclavian approach cardiac device is redemonstrated. Median sternotomy wires are seen. The lungs are clear. Degenerative changes are seen in the spine. IMPRESSION: No active pulmonary disease.  Cardiomegaly. Aortic Atherosclerosis (ICD10-I70.0). Electronically Signed   By: Zerita Boers M.D.   On: 11/11/2020  13:01    Procedures Procedures   Medications Ordered in ED Medications  0.9 %  sodium chloride infusion (has no administration in time range)  sodium chloride 0.9 % bolus 1,000 mL (0 mLs Intravenous Stopped 11/11/20 1432)    ED Course  I have reviewed the triage vital signs and the nursing notes.  Pertinent labs & imaging results that were available during my care of the patient were reviewed by me and considered in my medical decision making (see chart for details).    MDM Rules/Calculators/A&P                          Dorothy Li is here with lightheadedness.  History of alcohol abuse, CAD, cardiomyopathy status post ICD, breast cancer who presents with lightheadedness.  Blood pressure in the 70s upon arrival.  Improved to the 90s with rest.  Orthostatics are positive.  When patient was lying blood pressure in  the 90s but when she stood up she became symptomatic with blood pressure in the 70.  This really improved with sitting again.  She has brown stool on exam.  Recent admission for pneumonia.  Overall she appears cachectic.  She denies any true syncope with falls.  Has not hit her head.  She is not on blood thinners.  She denies any pain.  She denies any nausea, vomiting, diarrhea.  Overall this could be failure to thrive versus dehydration versus medication side effect versus less likely GI bleed.  Rectal temperature was 99.2.  Suspect that this is less likely sepsis but will pursue infectious work-up.  Will give fluid bolus and check for electrolyte abnormalities.  Anticipate admission.  Blood pressure has stabilized following fluids.  Overall infectious work-up is unremarkable.  Head CT is normal.  No pneumonia.  No significant electrolyte abnormality.  Lactic acid is mildly elevated.  Overall suspect volume depletion as cause of her symptoms today.  To be admitted to medicine for further care.  This chart was dictated using voice recognition software.  Despite best efforts to proofread,  errors can occur which can change the documentation meaning.    Final Clinical Impression(s) / ED Diagnoses Final diagnoses:  Dizziness  Orthostatic dizziness    Rx / DC Orders ED Discharge Orders    None       Lennice Sites, DO 11/11/20 1542

## 2020-11-12 DIAGNOSIS — E860 Dehydration: Secondary | ICD-10-CM | POA: Diagnosis not present

## 2020-11-12 DIAGNOSIS — Z853 Personal history of malignant neoplasm of breast: Secondary | ICD-10-CM | POA: Diagnosis not present

## 2020-11-12 DIAGNOSIS — Z20822 Contact with and (suspected) exposure to covid-19: Secondary | ICD-10-CM | POA: Diagnosis not present

## 2020-11-12 DIAGNOSIS — I2510 Atherosclerotic heart disease of native coronary artery without angina pectoris: Secondary | ICD-10-CM | POA: Diagnosis not present

## 2020-11-12 DIAGNOSIS — R42 Dizziness and giddiness: Secondary | ICD-10-CM | POA: Diagnosis not present

## 2020-11-12 LAB — BASIC METABOLIC PANEL
Anion gap: 7 (ref 5–15)
BUN: 11 mg/dL (ref 8–23)
CO2: 25 mmol/L (ref 22–32)
Calcium: 8.5 mg/dL — ABNORMAL LOW (ref 8.9–10.3)
Chloride: 106 mmol/L (ref 98–111)
Creatinine, Ser: 0.77 mg/dL (ref 0.44–1.00)
GFR, Estimated: >60 mL/min (ref >60)
Glucose, Bld: 136 mg/dL — ABNORMAL HIGH (ref 70–99)
Potassium: 3.6 mmol/L (ref 3.5–5.1)
Sodium: 138 mmol/L (ref 135–145)

## 2020-11-12 NOTE — Evaluation (Addendum)
Physical Therapy Evaluation Patient Details Name: Dorothy Li MRN: [id number] DOB: 1946-01-04 Today's Date: 11/12/2020   History of Present Illness  Pt is a 75 y.o. female admitted 4/26 with left back pain with PNA and hypotension. Pt with recent admission after fall 4/13 with T12 and L1 fxs treated with TLSO. PMH consists of multivessel coronary artery disease, status post CABG, chronic congestive heart failure with reduced ejection fraction (EF 25%), history of VT, status post implanted cardioverter-defibrillator, history of breast cancer, hypertension, hyperlipidemia, and alcohol dependence.  Clinical Impression  PTA pt had recently returned to her single story home with 3 steps to enter, where she lives with her husband. Since recent hospitalization pt has been walking with her TLSO and RW, she continues to do her own bathing and dressing and family provides for meals, housekeeping, medication management and transportation. Pt limited in safe mobility by pain in her L side, as well as generalized weakness and decreased endurance. Pt is supervision for bed mobility, min guard for sit<>stand and light min A for donning TLSO in standing. Pt able to ambulate short distance with RW and supervision. PT recommends resumption of HHPT at discharge. Pt and family hopeful for d/c this afternoon.     Follow Up Recommendations Home health PT;Supervision - Intermittent    Equipment Recommendations  None recommended by PT       Precautions / Restrictions Precautions Precautions: Fall;Back Precaution Comments: reviewed 3/3 back precautions as pt with most difficulty not twisting Required Braces or Orthoses: Spinal Brace Spinal Brace: Thoracolumbosacral orthotic;Applied in standing position Restrictions Weight Bearing Restrictions: No      Mobility  Bed Mobility Overal bed mobility: Needs Assistance Bed Mobility: Rolling;Sidelying to Sit Rolling: Supervision         General bed mobility  comments: cues for sequence to prevent twisting with rolling and transition to sitting    Transfers Overall transfer level: Modified independent   Transfers: Sit to/from Stand Sit to Stand: Modified independent (Device/Increase time)         General transfer comment: good power up reaches to RW for steadying  Ambulation/Gait Ambulation/Gait assistance: Supervision Gait Distance (Feet): 50 Feet Assistive device: Rolling walker (2 wheeled) Gait Pattern/deviations: Step-through pattern;Decreased stride length Gait velocity: slowed Gait velocity interpretation: <1.8 ft/sec, indicate of risk for recurrent falls General Gait Details: supervision for safety, limited by fatigue      Balance Overall balance assessment: Needs assistance   Sitting balance-Leahy Scale: Good     Standing balance support: Bilateral upper extremity supported Standing balance-Leahy Scale: Poor Standing balance comment: RW for gait                             Pertinent Vitals/Pain Pain Assessment: 0-10 Pain Score: 3  Pain Location: L flank with twisting Pain Descriptors / Indicators: Aching;Sore Pain Intervention(s): Limited activity within patient's tolerance;Monitored during session;Repositioned    Home Living Family/patient expects to be discharged to:: Private residence Living Arrangements: Spouse/significant other Available Help at Discharge: Family;Available 24 hours/day Type of Home: House Home Access: Stairs to enter Entrance Stairs-Rails: Psychiatric nurse of Steps: 3 Home Layout: Laundry or work area in basement;Able to live on main level with bedroom/bathroom Home Equipment: Environmental consultant - 2 wheels;Walker - 4 wheels;Cane - single point;Shower seat      Prior Function Level of Independence: Needs assistance   Gait / Transfers Assistance Needed: walks with walker  ADL's / Homemaking Assistance Needed: family assisting  with housekeeping, meals and  transportation           Extremity/Trunk Assessment   Upper Extremity Assessment Upper Extremity Assessment: Generalized weakness    Lower Extremity Assessment Lower Extremity Assessment: Generalized weakness    Cervical / Trunk Assessment Cervical / Trunk Assessment: Other exceptions Cervical / Trunk Exceptions: TLSO  Communication   Communication: No difficulties  Cognition Arousal/Alertness: Awake/alert Behavior During Therapy: WFL for tasks assessed/performed Overall Cognitive Status: Within Functional Limits for tasks assessed                                 General Comments: cues for back precautions and brace donning      General Comments General comments (skin integrity, edema, etc.): BP supine 117/68, sitting 118/68, standing 112/62        Assessment/Plan    PT Assessment Patient needs continued PT services  PT Problem List Decreased strength;Decreased mobility;Decreased safety awareness;Decreased activity tolerance;Decreased balance       PT Treatment Interventions DME instruction;Therapeutic activities;Gait training;Therapeutic exercise;Patient/family education;Balance training;Stair training;Functional mobility training    PT Goals (Current goals can be found in the Care Plan section)  Acute Rehab PT Goals Patient Stated Goal: return home PT Goal Formulation: With patient/family Time For Goal Achievement: 11/26/20 Potential to Achieve Goals: Good    Frequency Min 3X/week   Barriers to discharge Decreased caregiver support         AM-PAC PT "6 Clicks" Mobility  Outcome Measure Help needed turning from your back to your side while in a flat bed without using bedrails?: A Little Help needed moving from lying on your back to sitting on the side of a flat bed without using bedrails?: A Little Help needed moving to and from a bed to a chair (including a wheelchair)?: None Help needed standing up from a chair using your arms (e.g.,  wheelchair or bedside chair)?: None Help needed to walk in hospital room?: None Help needed climbing 3-5 steps with a railing? : A Little 6 Click Score: 21    End of Session Equipment Utilized During Treatment: Back brace Activity Tolerance: Patient tolerated treatment well Patient left: in chair;with call bell/phone within reach;with family/visitor present Nurse Communication: Mobility status PT Visit Diagnosis: Difficulty in walking, not elsewhere classified (R26.2);Other abnormalities of gait and mobility (R26.89)    Time: 5035-4656 PT Time Calculation (min) (ACUTE ONLY): 18 min   Charges:   PT Evaluation $PT Eval Moderate Complexity: 1 Mod          Phuc Kluttz B. Migdalia Dk PT, DPT Acute Rehabilitation Services Pager [phone] Office [phone]   [place] 11/12/2020, 10:45 AM

## 2020-11-12 NOTE — Progress Notes (Signed)
Orthostatic vital signs:  Lying: BP: 107/59, P: 102 Siting: BP: 88/54, P: 111 Standing: BP: 94/55, P: 102

## 2020-11-12 NOTE — Progress Notes (Addendum)
RN gave pt and her niece discharge instructions and she stated understanding. IV has been removed no new medications added. Pt dressed and belongings have been packed. NO DME pt Niece stated that she has one in the car that the pt was using.

## 2020-11-12 NOTE — TOC Transition Note (Signed)
Transition of Care [facility]) - CM/SW Discharge Note   Patient Details  Name: Dorothy Li MRN: [id number] Date of Birth: August 24, 1945  [facility] CM/SW Contact:  Carles Collet, RN Phone Number: 11/12/2020, 10:54 AM   Clinical Narrative:    Damaris Schooner w patient over the phone. She states that she has [place] through [place]. Notified liaison of Dc and resumption of [place] services. Patient denies need for DME. No other CM needs identified.     Final next level of care: [place] Barriers to Discharge: No Barriers Identified   Patient Goals and CMS Choice Patient states their goals for this hospitalization and ongoing recovery are:: to go home CMS Medicare.gov Compare Post Acute Care list provided to:: Patient Choice offered to / list presented to : Patient  Discharge Placement                       Discharge Plan and Services                DME Arranged: N/A         HH Arranged: PT HH Agency: [facility] Date [place]: 11/12/20 Time [place]: 1054 Representative spoke with at [place]: [place] ([place]) Interventions     Readmission Risk Interventions Readmission Risk Prevention Plan 10/19/2020  Transportation Screening Complete  PCP or Specialist Appt within 3-5 Days Not Complete  Not Complete comments Per [place], pt has appt on June 7.  She will attempt to move this appt up on Monday.  [place] or Home Care Consult Complete  Social Work Consult for [place] Planning/Counseling Complete  Palliative Care Screening Not Applicable  Some recent data might be hidden

## 2020-11-12 NOTE — Discharge Summary (Signed)
Physician Discharge Summary  [name]:[id number] DOB: 1945/10/10 DOA: 11/11/2020  PCP: [facility]  Admit date: 11/11/2020 Discharge date: 11/12/2020  Admitted From: home Disposition:  home  Recommendations for Outpatient Follow-up:  1. Follow up with PCP in 1-2 weeks 2. Please obtain BMP/CBC in one week  Home Health: PT Equipment/Devices: none  Discharge Condition: stable CODE STATUS: Full code Diet recommendation: regular  HPI: Per admitting MD, Dorothy Li is a 75 y.o. female with medical history significant of breast cancer status post lumpectomy, on hormone therapy, hypertension, CAD, chronic systolic CHF status post AICD, recent T12-L1 endplate fracture, osteoporosis presented with persistent left-sided flank/chest pain, poor oral intake and dehydration. According to family, patient stopped using the TLSO brace for ambulation for her T12, L1 fracture last week.  The patient developed more left-sided chest and flank pain with upper body movement or deep breath.  This morning, physical therapy technician went patient home and found patient lethargic, she immediately took patient blood pressure systolic was in the 09O and called 911. ED Course: Blood pressure largely improved after 1 L of IV boluses.  Patient much awake.  Continues to complain left-sided rib cage pain.  WBC, kidney function within normal limits.  Hospital Course / Discharge diagnoses: Principal Problem Dehydration -Improved, blood pressure stable, decided not to continue maintenance IV fluids given baseline history of systolic CHF. Her orthostasis resolved, she is normotensive upon standing, able to ambulate well in the hallway. She will be discharged home in stable condition  Active problems Hypotension -Secondary to dehydration, no symptoms or signs of active infection, blood pressure improved with IV fluids. Chronic systolic CHF -Signs of hypovolemia and dehydration. Euvolemic on dc Left  rib cage pain -supportive care Moderate protein calorie malnutrition -Continue protein supplement Breast CA -Follow-up with oncologist at [place] [facility]. Chronic T12/L1 fracture -PT evaluation, encouraged to use TLSO brace  Sepsis ruled out   Discharge Instructions   Allergies as of 11/12/2020      Reactions   Penicillins Other (See Comments)   "blacks out" per pt   Latex Hives, Other (See Comments)   Gloves   Tape Itching, Rash   Bandage on breast from bx caused rash      Medication List    TAKE these medications   acetaminophen 325 MG tablet Commonly known as: TYLENOL Take 650 mg by mouth every 6 (six) hours as needed for moderate pain or fever.   alendronate 70 MG tablet Commonly known as: FOSAMAX Take 70 mg by mouth every Monday. Take with a full glass of water on an empty stomach.   amoxicillin-clavulanate 875-125 MG tablet Commonly known as: AUGMENTIN Take 1 tablet by mouth 2 (two) times daily.   anastrozole 1 MG tablet Commonly known as: ARIMIDEX Take 1 tablet (1 mg total) by mouth daily.   aspirin EC 81 MG tablet Take 1 tablet (81 mg total) by mouth daily.   atorvastatin 80 MG tablet Commonly known as: LIPITOR Take 40 mg by mouth at bedtime.   Calcium-Vitamin D-Vitamin K 709-628-36 MG-UNT-MCG Tabs Take 1 tablet by mouth daily.   carvedilol 6.25 MG tablet Commonly known as: COREG Take 6.25 mg by mouth 2 (two) times daily with a meal.   feeding supplement Liqd Take 237 mLs by mouth 3 (three) times daily between meals.   ibuprofen 200 MG tablet Commonly known as: ADVIL Take 200 mg by mouth every 6 (six) hours as needed for headache or  mild pain.   LACTOBACILLUS PROBIOTIC PO Take 1 capsule by mouth daily.   multivitamin with minerals tablet Take 1 tablet by mouth daily.   pantoprazole 40 MG tablet Commonly known as: PROTONIX Take 40 mg by mouth 2 (two) times daily before a meal.   sertraline 100 MG  tablet Commonly known as: ZOLOFT Take 100 mg by mouth daily.   traZODone 50 MG tablet Commonly known as: DESYREL Take 50 mg by mouth at bedtime.   Vitamin D3 75 MCG (3000 UT) Tabs Take 3,000 Units by mouth daily.       Consultations:  None   Procedures/Studies:  DG Ribs Unilateral W/Chest Left  Result Date: 11/11/2020 CLINICAL DATA:  Dizziness.  Pain. EXAM: LEFT RIBS AND CHEST - 3+ VIEW COMPARISON:  Radiograph earlier today.  Chest CT 10/29/2020 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 Similar cardiomegaly to earlier today. Slight vascular congestion from earlier today. No pneumothorax or significant pleural effusion. Left basilar opacity on prior CT not well seen by radiograph. No acute osseous abnormalities. Multiple overlying monitoring devices. IMPRESSION: Similar cardiomegaly. Slight vascular congestion from earlier today. Electronically Signed   By: Keith Rake M.D.   On: 11/11/2020 17:25   DG Ribs Unilateral W/Chest Right  Result Date: 10/15/2020 CLINICAL DATA:  [place], pain EXAM: RIGHT RIBS AND CHEST - 3+ VIEW COMPARISON:  11/04/2017 FINDINGS: Frontal view of the chest as well as oblique views of the right thoracic cage are obtained. Please note that the frontal view of the chest is labeled incorrectly, as previous exams demonstrate the pacemaker generator to be within the left chest wall. Pacer/AICD is unchanged. Cardiac silhouette is stable. No airspace disease, effusion, or pneumothorax. There is a minimally displaced right posterolateral seventh rib fracture, best seen on the frontal image. No other acute bony abnormalities. IMPRESSION: 1. Minimally displaced right posterolateral seventh rib fracture. 2. Otherwise no acute intrathoracic process. Electronically Signed   By: Randa Ngo M.D.   On: 10/15/2020 21:31   CT Head Wo Contrast  Result Date: 11/11/2020 CLINICAL DATA:  Near syncope.  Dizziness. EXAM: CT HEAD WITHOUT CONTRAST  TECHNIQUE: Contiguous axial images were obtained from the base of the skull through the vertex without intravenous contrast. COMPARISON:  CT head dated October 15, 2020. FINDINGS: Brain: No evidence of acute infarction, hemorrhage, hydrocephalus, extra-axial collection or mass lesion/mass effect. Stable atrophy and chronic microvascular ischemic changes. Vascular: Calcified atherosclerosis at the skullbase. No hyperdense vessel. Skull: Normal. Negative for fracture or focal lesion. Sinuses/Orbits: No acute finding. Chronic retention cyst in the right maxillary sinus. Other: None. IMPRESSION: 1. No acute intracranial abnormality. 2. Stable atrophy and chronic microvascular ischemic changes. Electronically Signed   By: Titus Dubin M.D.   On: 11/11/2020 15:13   CT Head Wo Contrast  Result Date: 10/15/2020 CLINICAL DATA:  Patient's niece reported that patient has not been eating for the past several days , drinking ETOH daily , fell yesterday with right ribcage pain , fatigue and generalized weakness , hypotensive at triage . EXAM: CT HEAD WITHOUT CONTRAST CT CERVICAL SPINE WITHOUT CONTRAST TECHNIQUE: Multidetector CT imaging of the head and cervical spine was performed following the standard protocol without intravenous contrast. Multiplanar CT image reconstructions of the cervical spine were also generated. COMPARISON:  None. FINDINGS: CT HEAD FINDINGS Brain: Cerebral ventricle sizes are concordant with the degree of cerebral volume loss. Patchy and confluent areas of decreased attenuation are noted throughout the deep and periventricular white matter of the  cerebral hemispheres bilaterally, compatible with chronic microvascular ischemic disease. No evidence of large-territorial acute infarction. No parenchymal hemorrhage. No mass lesion. No extra-axial collection. No mass effect or midline shift. No hydrocephalus. Basilar cisterns are patent. Vascular: No hyperdense vessel. Atherosclerotic calcifications are  present within the cavernous internal carotid arteries. Skull: No acute fracture or focal lesion. Sinuses/Orbits: Paranasal sinuses and mastoid air cells are clear. The orbits are unremarkable. Other: None. CT CERVICAL SPINE FINDINGS Alignment: Normal. Skull base and vertebrae: No acute fracture. No aggressive appearing focal osseous lesion or focal pathologic process. Soft tissues and spinal canal: No prevertebral fluid or swelling. No visible canal hematoma. Upper chest: Emphysematous changes. Other: Mild to moderate atherosclerotic plaque of the carotid arteries in the neck. IMPRESSION: 1. No acute intracranial abnormality. 2. No acute displaced fracture or traumatic listhesis of the cervical spine. 3.  Emphysema (ICD10-J43.9). Electronically Signed   By: Iven Finn M.D.   On: 10/15/2020 22:03   CT CHEST WO CONTRAST  Result Date: 10/29/2020 CLINICAL DATA:  75 year old female with unresolved pneumonia. Status post trauma earlier this month. EXAM: CT CHEST WITHOUT CONTRAST TECHNIQUE: Multidetector CT imaging of the chest was performed following the standard protocol without IV contrast. COMPARISON:  Chest radiographs earlier today. CT Chest, Abdomen, and Pelvis 10/15/2020 FINDINGS: Cardiovascular: Left chest AICD. Prior CABG. Stable mild cardiomegaly. No pericardial effusion. Calcified aortic atherosclerosis. Vascular patency is not evaluated in the absence of IV contrast. Mediastinum/Nodes: Stable increased soft tissue at the left hilum from earlier this month corresponding to 16 mm short axis lymph node at that time. No superimposed mediastinal lymphadenopathy identified. Lungs/Pleura: Small left and trace right layering pleural effusions are new from earlier this month. No pneumothorax. Centrilobular emphysema. Major airways remain patent. On the right there is mild dependent atelectasis. On the left there is a greater degree of distal peribronchial and subpleural lower lobe opacity now (series 4, image  105) and the superior segment lower lobe cavitary lesion now appears more solid on series 4, image 83. No air bronchograms. Upper Abdomen: Stable, negative visible upper abdomen. Musculoskeletal: Osteopenia.  Prior sternotomy. Subtle anterior left 7th rib fracture on series 4, image 120 is likely related to the recent trauma. But no other definite recent left side rib fracture is identified. There is a healing nondisplaced right posterior 11th rib fracture. Stable chronic right posterior 7th rib fracture. T12 and L1 compression fractures are stable. No other acute or subacute osseous abnormality identified. No suspicious osseous lesion IMPRESSION: 1. Small left and trace right layering pleural effusions are new from earlier this month. 2. Increasing but nonspecific left lower lobe opacity. This could be worsening atelectasis or acute infection. Pulmonary contusion less likely. More solid appearance of the superior segment left lower lobe cavitary lesion, which is suspicious along with abnormal left hilar lymph node. These will require follow-up. Underlying Emphysema (ICD10-J43.9). Consider referral to [facility]). 3. Recent, healing anterior left 7th rib and right posterior 11th rib fracture suspected. Stable T12 and L1 compression fractures. 4. Aortic Atherosclerosis (ICD10-I70.0). Electronically Signed   By: Genevie Ann M.D.   On: 10/29/2020 08:53   CT Cervical Spine Wo Contrast  Result Date: 10/15/2020 CLINICAL DATA:  Patient's niece reported that patient has not been eating for the past several days , drinking ETOH daily , fell yesterday with right ribcage pain , fatigue and generalized weakness , hypotensive at triage . EXAM: CT HEAD WITHOUT CONTRAST CT CERVICAL SPINE WITHOUT CONTRAST TECHNIQUE: Multidetector  CT imaging of the head and cervical spine was performed following the standard protocol without intravenous contrast. Multiplanar CT image reconstructions of the  cervical spine were also generated. COMPARISON:  None. FINDINGS: CT HEAD FINDINGS Brain: Cerebral ventricle sizes are concordant with the degree of cerebral volume loss. Patchy and confluent areas of decreased attenuation are noted throughout the deep and periventricular white matter of the cerebral hemispheres bilaterally, compatible with chronic microvascular ischemic disease. No evidence of large-territorial acute infarction. No parenchymal hemorrhage. No mass lesion. No extra-axial collection. No mass effect or midline shift. No hydrocephalus. Basilar cisterns are patent. Vascular: No hyperdense vessel. Atherosclerotic calcifications are present within the cavernous internal carotid arteries. Skull: No acute fracture or focal lesion. Sinuses/Orbits: Paranasal sinuses and mastoid air cells are clear. The orbits are unremarkable. Other: None. CT CERVICAL SPINE FINDINGS Alignment: Normal. Skull base and vertebrae: No acute fracture. No aggressive appearing focal osseous lesion or focal pathologic process. Soft tissues and spinal canal: No prevertebral fluid or swelling. No visible canal hematoma. Upper chest: Emphysematous changes. Other: Mild to moderate atherosclerotic plaque of the carotid arteries in the neck. IMPRESSION: 1. No acute intracranial abnormality. 2. No acute displaced fracture or traumatic listhesis of the cervical spine. 3.  Emphysema (ICD10-J43.9). Electronically Signed   By: Iven Finn M.D.   On: 10/15/2020 22:03   CT CHEST ABDOMEN PELVIS W CONTRAST  Result Date: 10/16/2020 CLINICAL DATA:  Trauma. EXAM: CT CHEST, ABDOMEN, AND PELVIS WITH CONTRAST TECHNIQUE: Multidetector CT imaging of the chest, abdomen and pelvis was performed following the standard protocol during bolus administration of intravenous contrast. CONTRAST:  127mL OMNIPAQUE IOHEXOL 300 MG/ML  SOLN COMPARISON:  Chest x-ray 11/04/2017. FINDINGS: CHEST: Ports and Devices: Left chest wall 2 lead cardiac device. Lungs/airways:  Bilateral lower lobe subsegmental atelectasis. Left lower lobe scarring versus atelectasis. Left lower lobe 1.7 cm cavitary lung lesion. Cluster of pulmonary micronodules measuring up to 5 mm in area within the right middle lobe (4:89). No focal consolidation. No pulmonary mass. No pulmonary contusion or laceration. The central airways are patent. Pleura: No pleural effusion. No pneumothorax. No hemothorax. Lymph Nodes: There is a 1.6 cm left hilar lymph node (3:30, 5:73). No mediastinal or axillary lymphadenopathy. Mediastinum: No pneumomediastinum. No aortic injury or mediastinal hematoma. The thoracic aorta is normal in caliber. Atherosclerotic plaque of the thoracic aorta as well as four-vessel coronary artery calcifications status post CABG. The heart is normal in size. No significant pericardial effusion. The main pulmonary artery is normal in caliber. No central pulmonary embolus. The esophagus is unremarkable. Subcentimeter hypodense nodules within thyroid gland. Chest Wall / Breasts: No chest wall mass. Musculoskeletal: Subacute to chronic appearing nondisplaced right rib fractures. No acute displaced rib or sternal fracture. Sternotomy wires. Large Schmorl node of the T11 vertebral body with associated vertebral body height loss centrally. Fracture of the T12 vertebral body involving the anterior and posterior wall as well as superior endplate consistent with a burst fracture. 2 mm retropulsion into the central canal. No suspicious lytic or blastic osseous lesions. ABDOMEN / PELVIS: Liver: The left hepatic lobe is enlarged with suggestion of of a nodular hepatic contour. The hepatic parenchyma is diffusely hypodense compared to the splenic parenchyma consistent with fatty infiltration. No focal liver abnormality. There is a 3.6 cm hypodense right hepatic lobe lesion (3:51). No gallstones, gallbladder wall thickening, or pericholecystic fluid. No biliary dilatation. No laceration or subcapsular hematoma.  Biliary System: The gallbladder is otherwise unremarkable with no radio-opaque gallstones. No biliary  ductal dilatation. Pancreas: Normal pancreatic contour. No main pancreatic duct dilatation. Spleen: Not enlarged. No focal lesion. No laceration, subcapsular hematoma, or vascular injury. Adrenal Glands: No nodularity bilaterally. Kidneys: Bilateral kidneys enhance symmetrically. Fluid density lesions within bilateral kidneys likely represent simple renal cysts. Subcentimeter hypodensities are too small to characterize. There is a 1.8 cm left renal cystic lesion with a thin septation likely representing a minimally complex renal cyst. No hydronephrosis. No contusion, laceration, or subcapsular hematoma. No injury to the vascular structures or collecting systems. No hydroureter. The urinary bladder is unremarkable. Bowel: No small or large bowel wall thickening or dilatation. Scattered colonic diverticulosis. The appendix not definitely identified. Mesentery, Omentum, and Peritoneum: No simple free fluid ascites. No pneumoperitoneum. No hemoperitoneum. No mesenteric hematoma identified. No organized fluid collection. Pelvic Organs: Normal. Lymph Nodes: No abdominal, pelvic, inguinal lymphadenopathy. Vasculature: Severe atherosclerotic plaque of the aorta and its branches. No abdominal aorta or iliac aneurysm. No active contrast extravasation or pseudoaneurysm. Musculoskeletal: No significant soft tissue hematoma. No suspicious lytic or blastic osseous lesion. No acute pelvic fracture. No spinal fracture. Avascular necrosis of the right femoral head. IMPRESSION: 1. Left lower lobe 1.7 cm cavitary lung lesion. Associated left hilar lymphadenopathy. Finding could represent infection versus malignancy. Additional imaging evaluation or consultation with Pulmonology or Thoracic Surgery recommended 2. Question cirrhotic morphology with a 3.6 cm right hepatic lobe lesion. Recommend MRI liver protocol for further evaluation.  3. Age-indeterminate, possibly acute, burst fracture of the T12 vertebral body with 2 mm retropulsion into the central canal. Vertebral body height loss of the T11 vertebral body likely due to degenerative changes. Correlate with point tenderness to palpation for an acute component. Consider MRI lumbar spine for further evaluation. 4. Otherwise no acute traumatic injury to the chest, abdomen, or pelvis. 5. No acute fracture or traumatic malalignment of the lumbar spine. 6. Other imaging findings of potential clinical significance: Cluster of pulmonary nodules within the right middle lobe with area measuring up to 5 mm. Scattered colonic diverticulosis with no acute diverticulitis. Minimally complex left renal cyst. Avascular necrosis of the right femoral head. Aortic Atherosclerosis (ICD10-I70.0). Electronically Signed   By: Iven Finn M.D.   On: 10/16/2020 00:02   CT T-SPINE NO CHARGE  Result Date: 10/16/2020 CLINICAL DATA:  Spinal fracture.  Recent falls. EXAM: CT THORACIC SPINE WITHOUT CONTRAST TECHNIQUE: Multidetector CT images of the thoracic were obtained using the standard protocol without intravenous contrast. CT chest images reconstructed from CT chest abdomen pelvis 10/15/2020 COMPARISON:  CT chest abdomen pelvis 10/15/2020. FINDINGS: Alignment: Normal alignment. Vertebrae: Compression fractures of T12 and L1 which appear acute. Thoracic numbering performed from the craniocervical junction using CT cervical spine 10/15/2020. 30% loss of height of T12 vertebral body due to acute fracture. 40% loss of height of L1 vertebral body. Fractures are not causing significant stenosis. No fracture in the posterior elements. Paraspinal and other soft tissues: Negative for paraspinous mass or adenopathy. Mild bibasilar atelectasis. No pleural effusion. 16 mm left hilar lymph node as described on prior chest CT report Disc levels: Mild disc degeneration is seen throughout the thoracic spine with disc space  narrowing and disc calcification. No significant spinal stenosis identified. Mild facet degeneration is present in the thoracic spine. IMPRESSION: Acute fractures superior endplates of W09 and L1 without significant spinal stenosis. Electronically Signed   By: Franchot Gallo M.D.   On: 10/16/2020 14:29   CT L-SPINE NO CHARGE  Result Date: 10/16/2020 CLINICAL DATA:  Recent fall.  Compression  fracture. EXAM: CT LUMBAR SPINE WITHOUT CONTRAST TECHNIQUE: Multidetector CT imaging of the lumbar spine was performed without intravenous contrast administration. Multiplanar CT image reconstructions were also generated. COMPARISON:  None. FINDINGS: Segmentation: S1 is fully lumbarized when counting from the craniocervical junction. Alignment: Normal Vertebrae: Superior endplate fractures of K93 and L1 appear acute. L1 compression fracture approximately 40% loss of height and T12 fracture approximately 30% loss of height. No significant retropulsion of bone into the canal. No posterior element fractures. No mass lesion identified. Paraspinal and other soft tissues: Vascular contrast present. Lumbar spine images reformatted from CT chest abdomen pelvis with contrast. No paraspinous edema or mass identified. Atherosclerotic aorta. Disc levels: T12-L1: Disc degeneration and mild spurring. Negative for stenosis L1-2: Negative for stenosis L2-3: Mild disc bulging.  Negative for stenosis L3-4: Mild disc bulging and mild facet hypertrophy. Negative for stenosis L4-5: Moderate disc bulging. Mild to moderate facet hypertrophy bilaterally. Mild spinal stenosis and mild subarticular stenosis bilaterally L5-S1: Disc bulging and facet degeneration. Mild subarticular stenosis bilaterally S1-2: Mild facet degeneration. Mild disc bulging. Negative for stenosis. IMPRESSION: Acute superior endplate fractures of G18 and L1. No significant spinal stenosis. No evidence of underlying mass lesion S1 is fully lumbarized based on counting from the  craniocervical junction. Electronically Signed   By: Franchot Gallo M.D.   On: 10/16/2020 14:23   DG Chest Portable 1 View  Result Date: 11/11/2020 CLINICAL DATA:  Near syncope EXAM: PORTABLE CHEST 1 VIEW COMPARISON:  Chest radiograph dated 10/29/2020. FINDINGS: The heart is enlarged. Vascular calcifications are seen in the aortic arch. A left subclavian approach cardiac device is redemonstrated. Median sternotomy wires are seen. The lungs are clear. Degenerative changes are seen in the spine. IMPRESSION: No active pulmonary disease.  Cardiomegaly. Aortic Atherosclerosis (ICD10-I70.0). Electronically Signed   By: Zerita Boers M.D.   On: 11/11/2020 13:01   DG Chest Port 1 View  Result Date: 10/29/2020 CLINICAL DATA:  Tachycardia EXAM: PORTABLE CHEST 1 VIEW COMPARISON:  10/15/2020 FINDINGS: Progressive left basilar atelectasis or infiltrate. Small left pleural effusion has developed. Left suprahilar opacity has developed, new since prior examination, possibly infectious or inflammatory in nature. No pneumothorax. No pleural effusion on the right. Coronary artery bypass grafting has been performed. Cardiac size is within normal limits. Left subclavian dual lead pacemaker defibrillator is unchanged. No acute bone abnormality. IMPRESSION: Progressive left basilar infiltrate and development of a left suprahilar opacity, new since CT examination 10/15/2020, suspicious for an acute infectious process in the appropriate clinical setting. Interval development of small left parapneumonic effusion. Electronically Signed   By: Fidela Salisbury MD   On: 10/29/2020 03:37   ECHOCARDIOGRAM COMPLETE  Result Date: 10/16/2020    ECHOCARDIOGRAM REPORT   Patient Name:   Dorothy Li Date of Exam: 10/16/2020 Medical Rec #:  [id number]    Height:       69.0 in Accession #:    [id number]   Weight:       121.3 lb Date of Birth:  07/31/1945   BSA:          1.670 m Patient Age:    75 years     BP:           96/43 mmHg Patient Gender:  F            HR:           90 bpm. Exam Location:  Inpatient Procedure: 2D Echo, Cardiac Doppler and Color Doppler Indications:    CHF  History:        Patient has prior history of Echocardiogram examinations, most                 recent 12/13/2013. Cardiomyopathy and CHF,                 Signs/Symptoms:Syncope; Risk Factors:Hypertension and                 Dyslipidemia.  Sonographer:    Luisa Hart RDCS Referring Phys: [id number] RAFAL POPLAWSKI  Sonographer Comments: Technically difficult study due to poor echo windows. Had to use light touch on patient due to sore ribs. 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 Left ventricular ejection fraction, by estimation, is 30 to 35%. The left ventricle has moderately decreased function. The left ventricle demonstrates global hypokinesis. Left ventricular diastolic parameters are consistent with Grade I diastolic dysfunction (impaired relaxation).  2. Right ventricular systolic function is mildly reduced. The right ventricular size is mildly enlarged. There is normal pulmonary artery systolic pressure. The estimated right ventricular systolic pressure is 87.8 mmHg.  3. The mitral valve is normal in structure. No evidence of mitral valve regurgitation. No evidence of mitral stenosis.  4. The aortic valve is tricuspid. Aortic valve regurgitation is not visualized. No aortic stenosis is present.  5. The inferior vena cava is normal in size with greater than 50% respiratory variability, suggesting right atrial pressure of 3 mmHg. FINDINGS  Left Ventricle: Left ventricular ejection fraction, by estimation, is 30 to 35%. The left ventricle has moderately decreased function. The left ventricle demonstrates global hypokinesis. The left ventricular internal cavity size was normal in size. There is no left ventricular hypertrophy. Left ventricular diastolic parameters are consistent with Grade I diastolic dysfunction (impaired relaxation). Right Ventricle: The right ventricular  size is mildly enlarged. No increase in right ventricular wall thickness. Right ventricular systolic function is mildly reduced. There is normal pulmonary artery systolic pressure. The tricuspid regurgitant velocity  is 2.82 m/s, and with an assumed right atrial pressure of 3 mmHg, the estimated right ventricular systolic pressure is 67.6 mmHg. Left Atrium: Left atrial size was normal in size. Right Atrium: Right atrial size was normal in size. Pericardium: There is no evidence of pericardial effusion. Mitral Valve: The mitral valve is normal in structure. No evidence of mitral valve regurgitation. No evidence of mitral valve stenosis. Tricuspid Valve: The tricuspid valve is normal in structure. Tricuspid valve regurgitation is trivial. Aortic Valve: The aortic valve is tricuspid. Aortic valve regurgitation is not visualized. No aortic stenosis is present. Aortic valve mean gradient measures 2.0 mmHg. Aortic valve peak gradient measures 3.2 mmHg. Aortic valve area, by VTI measures 1.95 cm. Pulmonic Valve: The pulmonic valve was normal in structure. Pulmonic valve regurgitation is trivial. Aorta: The aortic root is normal in size and structure. Venous: The inferior vena cava is normal in size with greater than 50% respiratory variability, suggesting right atrial pressure of 3 mmHg. IAS/Shunts: No atrial level shunt detected by color flow Doppler. Additional Comments: A device lead is visualized in the right ventricle.  LEFT VENTRICLE PLAX 2D LVIDd:         5.40 cm     Diastology LVIDs:         4.20 cm     LV e' medial:    5.08 cm/s LV PW:         1.10 cm     LV E/e' medial:  9.5 LV IVS:  1.10 cm     LV e' lateral:   9.11 cm/s LVOT diam:     2.20 cm     LV E/e' lateral: 5.3 LV SV:         34 LV SV Index:   20 LVOT Area:     3.80 cm  LV Volumes (MOD) LV vol d, MOD A2C: 29.9 ml LV vol d, MOD A4C: 45.1 ml LV vol s, MOD A2C: 25.2 ml LV vol s, MOD A4C: 36.2 ml LV SV MOD A2C:     4.7 ml LV SV MOD A4C:     45.1 ml LV  SV MOD BP:      6.7 ml RIGHT VENTRICLE TAPSE (M-mode): 0.6 cm LEFT ATRIUM             Index       RIGHT ATRIUM          Index LA diam:        3.50 cm 2.10 cm/m  RA Area:     9.02 cm LA Vol (A2C):   26.5 ml 15.87 ml/m RA Volume:   16.70 ml 10.00 ml/m LA Vol (A4C):   25.8 ml 15.45 ml/m LA Biplane Vol: 28.0 ml 16.76 ml/m  AORTIC VALVE                   PULMONIC VALVE AV Area (Vmax):    2.60 cm    PV Vmax:       0.66 m/s AV Area (Vmean):   2.04 cm    PV Vmean:      56.600 cm/s AV Area (VTI):     1.95 cm    PV VTI:        0.165 m AV Vmax:           88.80 cm/s  PV Peak grad:  1.8 mmHg AV Vmean:          67.100 cm/s PV Mean grad:  1.0 mmHg AV VTI:            0.172 m AV Peak Grad:      3.2 mmHg AV Mean Grad:      2.0 mmHg LVOT Vmax:         60.80 cm/s LVOT Vmean:        36.000 cm/s LVOT VTI:          0.088 m LVOT/AV VTI ratio: 0.51  AORTA Ao Root diam: 3.30 cm Ao Asc diam:  3.10 cm MITRAL VALVE               TRICUSPID VALVE MV Area (PHT): 3.23 cm    TR Peak grad:   31.8 mmHg MV Decel Time: 235 msec    TR Vmax:        282.00 cm/s MV E velocity: 48.10 cm/s MV A velocity: 65.60 cm/s  SHUNTS MV E/A ratio:  0.73        Systemic VTI:  0.09 m                            Systemic Diam: 2.20 cm Loralie Champagne MD Electronically signed by Loralie Champagne MD Signature Date/Time: 10/16/2020/3:12:37 PM    Final       Subjective: - no chest pain, shortness of breath, no abdominal pain, nausea or vomiting.   Discharge Exam: BP (!) 108/59 (BP Location: Right Arm)   Pulse 80   Temp 98.2 F (36.8 C) (Oral)  Resp 18   Ht 5\' 8"  (1.727 m)   Wt 52.1 kg   SpO2 91%   BMI 17.46 kg/m   General: Pt is alert, awake, not in acute distress Cardiovascular: RRR, S1/S2 +, no rubs, no gallops Respiratory: CTA bilaterally, no wheezing, no rhonchi Abdominal: Soft, NT, ND, bowel sounds + Extremities: no edema, no cyanosis    The results of significant diagnostics from this hospitalization (including imaging, microbiology,  ancillary and laboratory) are listed below for reference.     Microbiology: Recent Results (from the past 240 hour(s))  Blood culture (routine x 2)     Status: None (Preliminary result)   Collection Time: 11/11/20 12:16 PM   Specimen: BLOOD RIGHT FOREARM  Result Value Ref Range Status   Specimen Description BLOOD RIGHT FOREARM  Final   Special Requests   Final    BOTTLES DRAWN AEROBIC AND ANAEROBIC Blood Culture adequate volume   Culture   Final    NO GROWTH < 24 HOURS Performed at [facility], [place] [address], [place], [place] [address]    Report Status PENDING  Incomplete  Blood culture (routine x 2)     Status: None (Preliminary result)   Collection Time: 11/11/20  1:18 PM   Specimen: BLOOD LEFT FOREARM  Result Value Ref Range Status   Specimen Description BLOOD LEFT FOREARM  Final   Special Requests   Final    BOTTLES DRAWN AEROBIC AND ANAEROBIC Blood Culture results may not be optimal due to an inadequate volume of blood received in culture bottles   Culture   Final    NO GROWTH < 24 HOURS Performed at [facility], [place] [address] [place], [place] [address]    Report Status PENDING  Incomplete  SARS CORONAVIRUS 2 (TAT 6-24 HRS) Nasopharyngeal Nasopharyngeal Swab     Status: None   Collection Time: 11/11/20  6:32 PM   Specimen: Nasopharyngeal Swab  Result Value Ref Range Status   SARS Coronavirus 2 NEGATIVE NEGATIVE Final    Comment: (NOTE) SARS-CoV-2 target nucleic acids are NOT DETECTED.  The SARS-CoV-2 RNA is generally detectable in upper and lower respiratory specimens during the acute phase of infection. Negative results do not preclude SARS-CoV-2 infection, do not rule out co-infections with other pathogens, and should not be used as the sole basis for treatment or other patient management decisions. Negative results must be combined with clinical observations, patient history, and epidemiological information. The expected result is Negative.  Fact  Sheet for Patients: SugarRoll.be  Fact Sheet for Healthcare Providers: https://www.woods-mathews.com/  This test is not yet approved or cleared by the [place] FDA and  has been authorized for detection and/or diagnosis of SARS-CoV-2 by FDA under an Emergency Use Authorization (EUA). This EUA will remain  in effect (meaning this test can be used) for the duration of the COVID-19 declaration under Se ction 564(b)(1) of the Act, 21 U.S.C. section 360bbb-3(b)(1), unless the authorization is terminated or revoked sooner.  Performed at [facility], [place] [address] [place], [place] [address]      Labs: Basic Metabolic Panel: Recent Labs  Lab 11/11/20 1209 11/12/20 0112  NA 139 138  K 3.9 3.6  CL 108 106  CO2 25 25  GLUCOSE 101* 136*  BUN 9 11  CREATININE 0.82 0.77  CALCIUM 9.0 8.5*   Liver Function Tests: Recent Labs  Lab 11/11/20 1209  AST 24  ALT 14  ALKPHOS 71  BILITOT 0.1*  PROT  6.6  ALBUMIN 2.7*   CBC: Recent Labs  Lab 11/11/20 1209  WBC 6.5  NEUTROABS 3.8  HGB 10.6*  HCT 32.3*  MCV 103.5*  PLT 328   CBG: Recent Labs  Lab 11/11/20 1234  GLUCAP 91   Hgb A1c No results for input(s): HGBA1C in the last 72 hours. Lipid Profile No results for input(s): CHOL, HDL, LDLCALC, TRIG, CHOLHDL, LDLDIRECT in the last 72 hours. Thyroid function studies No results for input(s): TSH, T4TOTAL, T3FREE, THYROIDAB in the last 72 hours.  Invalid input(s): FREET3 Urinalysis    Component Value Date/Time   COLORURINE AMBER (A) 10/29/2020 0210   APPEARANCEUR HAZY (A) 10/29/2020 0210   LABSPEC 1.020 10/29/2020 0210   PHURINE 8.0 10/29/2020 0210   GLUCOSEU NEGATIVE 10/29/2020 0210   HGBUR NEGATIVE 10/29/2020 0210   BILIRUBINUR NEGATIVE 10/29/2020 0210   KETONESUR NEGATIVE 10/29/2020 0210   PROTEINUR NEGATIVE 10/29/2020 0210   UROBILINOGEN 0.2 01/12/2012 0842   NITRITE NEGATIVE 10/29/2020 0210   LEUKOCYTESUR  NEGATIVE 10/29/2020 0210    FURTHER DISCHARGE INSTRUCTIONS:   Get Medicines reviewed and adjusted: Please take all your medications with you for your next visit with your Primary MD   Laboratory/radiological data: Please request your Primary MD to go over all hospital tests and procedure/radiological results at the follow up, please ask your Primary MD to get all Hospital records sent to his/her office.   In some cases, they will be blood work, cultures and biopsy results pending at the time of your discharge. Please request that your primary care M.D. goes through all the records of your hospital data and follows up on these results.   Also Note the following: If you experience worsening of your admission symptoms, develop shortness of breath, life threatening emergency, suicidal or homicidal thoughts you must seek medical attention immediately by calling 911 or calling your MD immediately  if symptoms less severe.   You must read complete instructions/literature along with all the possible adverse reactions/side effects for all the Medicines you take and that have been prescribed to you. Take any new Medicines after you have completely understood and accpet all the possible adverse reactions/side effects.    Do not drive when taking Pain medications or sleeping medications (Benzodaizepines)   Do not take more than prescribed Pain, Sleep and Anxiety Medications. It is not advisable to combine anxiety,sleep and pain medications without talking with your primary care practitioner   Special Instructions: If you have smoked or chewed Tobacco  in the last 2 yrs please stop smoking, stop any regular Alcohol  and or any Recreational drug use.   Wear Seat belts while driving.   Please note: You were cared for by a hospitalist during your hospital stay. Once you are discharged, your primary care physician will handle any further medical issues. Please note that NO REFILLS for any discharge  medications will be authorized once you are discharged, as it is imperative that you return to your primary care physician (or establish a relationship with a primary care physician if you do not have one) for your post hospital discharge needs so that they can reassess your need for medications and monitor your lab values.  Time coordinating discharge: 40 minutes  SIGNED:  Marzetta Board, MD, PhD 11/12/2020, 10:05 AM \

## 2020-11-14 ENCOUNTER — Other Ambulatory Visit: Payer: Self-pay

## 2020-11-14 ENCOUNTER — Encounter (HOSPITAL_COMMUNITY)
Admission: RE | Admit: 2020-11-14 | Discharge: 2020-11-14 | Disposition: A | Discharge status: Home / Self Care | Payer: Medicare PPO | Source: Ambulatory Visit | Attending: Pulmonary Disease | Admitting: Pulmonary Disease

## 2020-11-14 DIAGNOSIS — R911 Solitary pulmonary nodule: Secondary | ICD-10-CM | POA: Insufficient documentation

## 2020-11-14 DIAGNOSIS — R590 Localized enlarged lymph nodes: Secondary | ICD-10-CM | POA: Insufficient documentation

## 2020-11-14 LAB — GLUCOSE, CAPILLARY: Glucose-Capillary: 89 mg/dL (ref 70–99)

## 2020-11-14 MED ORDER — FLUDEOXYGLUCOSE F - 18 (FDG) INJECTION
5.6800 | Freq: Once | INTRAVENOUS | Status: AC | PRN
  Administered 2020-11-14: 5.68 via INTRAVENOUS

## 2020-11-16 LAB — CULTURE, BLOOD (ROUTINE X 2)
Culture: NO GROWTH
Culture: NO GROWTH
Special Requests: ADEQUATE

## 2020-11-20 ENCOUNTER — Telehealth: Payer: Self-pay | Admitting: Pulmonary Disease

## 2020-11-20 NOTE — Telephone Encounter (Signed)
I called and spoke with patient niece who is patient POA and she is requesting PET scan be sent to [facility] pulmonologist that patient saw today and also requesting a CD be made of PET scan for them. [facility] and also called [facility] who is will make disc and niece will pick it up. I asked if patient was coming for appt with Dr. Valeta Harms tomm and niece said she told "someone" to cancel it and they said they did but it was not so I went ahead and canceled it. Niece verbalized understanding, nothing further needed.

## 2020-11-21 ENCOUNTER — Telehealth: Payer: Self-pay

## 2020-11-21 ENCOUNTER — Ambulatory Visit: Payer: Self-pay | Admitting: Pulmonary Disease

## 2020-11-21 NOTE — Telephone Encounter (Signed)
PCCM:  I called and spoke with [name], patient's POA.  Patient will have follow-up with [facility].  I encouraged her to reach out to [place] if needed.  Garner Nash, DO [facility] [facility] 11/21/2020 12:36 PM

## 2020-11-21 NOTE — Progress Notes (Signed)
Dorothy Li,   Results reviewed with patient's POA Dorothy Li via phone as well as patient.  Patient is following up with the [facility] in [place].  Thanks,  [place], [place] [facility] 11/21/2020 12:48 PM

## 2020-11-21 NOTE — Telephone Encounter (Signed)
PCCM:  I called and spoke with the patient.  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  I am happy for her to see me this afternoon if she can find a ride to the office.  She is going to work on that. She states that she will call us if she gets a ride.   Otherwise, she can be scheduled next available with me as she has already cancelled today appt or choose to follow up with [facility]  She needs to be setup for biopsy and I explained this.    Garner Nash, DO [place] [facility] 11/21/2020 10:33 AM

## 2020-11-21 NOTE — Telephone Encounter (Signed)
ATC Dorothy Li because I received a call from Dr. Valeta Li this morning asking why the patients appointment was canceled. Informed him that note says patient is seeing pulmonologist at [facility] clinic. Dr. Valeta Li explained that the [facility] is not able to do the procedure that she needs to have done as well a couple other things he needs to do. Dorothy Li to call back today if she could. Patient was on Dr. Fabio Li schedule for today at 1:30 and he would like for her to be seen today if possible.   If they call back please either let me know or put her back on Dr. Fabio Li schedule for today he will have to be double booked

## 2020-11-21 NOTE — Telephone Encounter (Signed)
May need to reach out to patient or [name]

## 2020-11-21 NOTE — Telephone Encounter (Signed)
Dr. Valeta Harms please advise-  Pt's niece/POA is asking to speak with you about pt.  Also I want to clarify- you are double booked twice this afternoon already. Do you still want to add pt on to schedule this afternoon?

## 2020-11-21 NOTE — Telephone Encounter (Signed)
[name] niece is returning phone call. Will be taking patient to the [facility] clinic first. [place] phone number is [phone].

## 2020-12-21 ENCOUNTER — Ambulatory Visit (INDEPENDENT_AMBULATORY_CARE_PROVIDER_SITE_OTHER): Payer: No Typology Code available for payment source

## 2020-12-21 ENCOUNTER — Ambulatory Visit (HOSPITAL_COMMUNITY)
Admission: EM | Admit: 2020-12-21 | Discharge: 2020-12-21 | Disposition: A | Discharge status: Home / Self Care | Payer: No Typology Code available for payment source | Attending: Physician Assistant | Admitting: Physician Assistant

## 2020-12-21 ENCOUNTER — Encounter (HOSPITAL_COMMUNITY): Payer: Self-pay

## 2020-12-21 DIAGNOSIS — W19XXXA Unspecified fall, initial encounter: Secondary | ICD-10-CM | POA: Diagnosis not present

## 2020-12-21 DIAGNOSIS — R0781 Pleurodynia: Secondary | ICD-10-CM | POA: Diagnosis not present

## 2020-12-21 DIAGNOSIS — S2231XA Fracture of one rib, right side, initial encounter for closed fracture: Secondary | ICD-10-CM

## 2020-12-21 DIAGNOSIS — R0782 Intercostal pain: Secondary | ICD-10-CM | POA: Diagnosis not present

## 2020-12-21 NOTE — ED Triage Notes (Signed)
Pt presents with a fall that happened a few days ago. Caregiver states she has bee  having a hard time breathing due to the fall. Pt states she is having pain on the right side.

## 2020-12-21 NOTE — ED Provider Notes (Signed)
[facility]    CSN: [id number] Arrival date & time: 12/21/20  1717      History   Chief Complaint Chief Complaint  Patient presents with   Fall    HPI [name] is a 75 y.o. female.   Patient presents today accompanied by her caregiver who provided majority of history.  Reports that patient fell out of bed and landed on her back approximately 2 days ago.  She fell onto something and has had pain in her right posterior lower ribs since that time.  She does have a history of previous falls that have resulted in rib and vertebral fractures.  She is currently wearing a back brace was not wearing this at the time of fall.  She reports pain is severe and rated 9 on a 0-10 pain scale, localized to affected area without radiation, worse with certain movements or palpation, no alleviating factors identified.  Patient is confident that she did not hit her head and denies any loss of consciousness, nausea, vomiting, vision changes, focal weakness.  Discussed with patient and caregiver that anyone over the age of 84 who has had a possible head injury should have a head CT they are confident she did not injure her head and declined head CT.  She does not take blood thinners other than baby aspirin.  Patient has been taking ibuprofen without improvement of symptoms.  She is having difficulty with daily activities as a result of symptoms.   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  Patient had CABG with LIMA-LAD and SVG-OM2.    Cardiomyopathy    Echo (4/11) with EF 25%, diffuse hypokinesis, moderate LV dilation, decreased RV  systolic function but normal RV  size, PA systolic pressure 35 mmHg. Lexiscan myoview (4/11): EF 29%,  diffuse hypokinesis, basal anteroseptal and apical scar, no ischemia.  The cardiomyopathy is likely due to a combination of CAD (LM stenosis) and ETOH abuse.    Carotid stenosis    carotid dopplers (1/82) with 99-37% LICA stenosis.  Carotid dopplers (16/96): 78-93%  LICA stenosis.   ETOH abuse     Elevated AST, mildly elevated INR (? component of cirrhosis). Has now quit ETOH.    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 Vangie Bicker   Syncope    none since ICD placement   Use of anastrozole (Arimidex) 05/12/12   VT (ventricular tachycardia) ([place])    Dual chamber Medtronic ICD placed 7/11    Patient Active Problem List   Diagnosis Date Noted   Dehydration 11/11/2020   Protein-calorie malnutrition, severe 10/30/2020   Pneumonia 10/29/2020   CAD (coronary artery disease) 10/29/2020   Prolonged QT interval 10/29/2020   Parapneumonic effusion 10/29/2020   Multiple falls 10/16/2020   Hypokalemia 10/15/2020   Hypomagnesemia  10/15/2020   EtOH dependence ([place]) 10/15/2020   FTT (failure to thrive) in adult 10/15/2020   Colon polyps 04/11/2012   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   Breast cancer, right breast ([place]) 12/08/2011   Breast cancer ([place]) 12/08/2011   Orthostatic dizziness 03/10/2011   ARF (acute renal failure) ([place]) 02/04/2011   Hypotension 01/26/2011   Pure hypercholesterolemia 07/30/2010   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   CAROTID ARTERY STENOSIS 02/11/2010   CORONARY ATHEROSLERO UNSPEC TYPE BYPASS GRAFT 11/11/2009   CAROTID BRUIT 11/11/2009   Nonischemic  cardiomyopathy/ CAD-CABG 10/08/2009   Chronic combined systolic and diastolic CHF (congestive heart failure) ([place]) 10/08/2009   VENTRICULAR TACHYCARDIA 09/16/2009   Essential hypertension 09/05/2009   SYNCOPE 09/05/2009    Past Surgical History:  Procedure Laterality Date   ABDOMINAL HYSTERECTOMY     TAH-BSO, fibroids, age 19   BREAST BIOPSY  12/08/11   Right Breast, Upper Outer Quadrant: Invasive Ductal:DCIS   BREAST LUMPECTOMY  01-19-12   right lumpectomy/   CARDIAC CATHETERIZATION     COLONOSCOPY  04/11/2012   Procedure: COLONOSCOPY;  Surgeon: Jerene Bears, MD;  Location: [facility];  Service: Gastroenterology;  Laterality: N/A;   CORONARY ARTERY BYPASS GRAFT  4/11    x 2   ICD implantation  2011   ICD-Medtronic    Remote - No /  Hx of VT, LU chest    OB History     Gravida  1   Para      Term      Preterm      AB      Living         SAB      IAB      Ectopic      Multiple      Live Births           Obstetric Comments  3 pregnancies and 0 births          Home Medications    Prior to Admission medications   Medication Sig Start Date End Date Taking? Authorizing Provider  acetaminophen (TYLENOL) 325 MG tablet Take 650 mg by mouth every 6 (six) hours as needed for moderate pain or fever.    [provider]  alendronate (FOSAMAX) 70 MG tablet Take 70 mg by mouth every Monday. Take with a full glass of water on an empty stomach.    [provider]  amoxicillin-clavulanate (AUGMENTIN) 875-125 MG tablet Take 1 tablet by mouth 2 (two) times daily.    [provider]  anastrozole (ARIMIDEX) 1 MG tablet Take 1 tablet (1 mg total) by mouth daily. Patient not taking: No sig reported 07/27/13   Marcy Panning, MD  aspirin EC 81 MG tablet Take 1 tablet (81 mg total) by mouth daily. 04/08/11   Larey Dresser, MD  atorvastatin (LIPITOR) 80 MG tablet Take 40 mg by mouth at bedtime. Patient not taking: No sig reported    [provider]  Calcium-Vitamin D-Vitamin K 750-500-40 MG-UNT-MCG TABS Take 1 tablet by mouth daily.    [provider]  carvedilol (COREG) 6.25 MG tablet Take 6.25 mg by mouth 2 (two) times daily with a meal.    [provider]  Cholecalciferol (VITAMIN D3) 3000 UNITS TABS Take 3,000 Units by mouth daily.    [provider]  feeding supplement (ENSURE ENLIVE / ENSURE PLUS) LIQD Take 237 mLs by mouth 3 (three) times daily between meals.  Patient not taking: Reported on 11/11/2020 10/30/20   Antonieta Pert, MD  ibuprofen (ADVIL) 200 MG tablet Take 200 mg by mouth every 6 (six) hours as needed for headache or mild pain.    [provider]  LACTOBACILLUS PROBIOTIC PO Take 1 capsule by mouth daily.    [provider]  Multiple Vitamins-Minerals (MULTIVITAMIN WITH MINERALS) tablet Take 1 tablet by mouth daily.    [provider]  pantoprazole (PROTONIX) 40 MG tablet Take 40 mg by mouth 2 (two) times daily before a meal.    [provider]  sertraline (ZOLOFT) 100 MG tablet Take 100 mg by mouth daily.    [provider]  traZODone (DESYREL) 50 MG tablet Take 50 mg by mouth at bedtime. 06/02/15   [provider]    Family History Family History  Problem Relation Age of Onset   Heart disease Sister        Had surgery   Heart failure Sister    Diabetes Mother    Breast cancer Cousin    Colon cancer Neg Hx     Social History Social History   Tobacco Use   Smoking status: Former    Packs/day: 1.00    Years: 40.00    Pack years: 40.00    Types: Cigarettes    Quit date: 10/08/2009    Years since quitting: 11.2   Smokeless tobacco: Never  Substance Use Topics   Alcohol use: Yes    Comment: Prior hx 2 drinks daily.  quit 09/2009   Drug use: No     Allergies   Penicillins, Latex, and Tape   Review of Systems Review of Systems  Constitutional:  Negative for activity change, appetite change, fatigue and fever.   Eyes:  Negative for visual disturbance.  Respiratory:  Positive for shortness of breath. Negative for cough.   Cardiovascular:  Negative for chest pain.  Gastrointestinal:  Negative for abdominal pain, diarrhea, nausea and vomiting.  Musculoskeletal:  Positive for arthralgias and back pain. Negative for myalgias.  Neurological:  Negative for dizziness, weakness, light-headedness, numbness and headaches.    Physical Exam Triage Vital Signs ED Triage Vitals  Enc Vitals Group     BP 12/21/20 1825 (!) 141/70     Pulse Rate 12/21/20 1825 (!) 106     Resp 12/21/20 1825 16     Temp 12/21/20 1825 99.1 F (37.3 C)     Temp Source 12/21/20 1825 Oral     SpO2 12/21/20 1825 100 %     Weight --      Height --      Head Circumference --      Peak Flow --      Pain Score 12/21/20 1824 9     Pain Loc --      Pain Edu? --      Excl. in [place]? --    No data found.  Updated Vital Signs BP (!) 141/70 (BP Location: Right Arm)   Pulse (!) 106   Temp 99.1 F (37.3 C) (Oral)   Resp 16   SpO2 100%   Visual Acuity Right Eye Distance:   Left Eye Distance:   Bilateral Distance:    Right Eye Near:   Left Eye Near:    Bilateral Near:     Physical Exam Vitals reviewed.  Constitutional:      General: She is awake. She is not in acute distress.    Appearance: Normal appearance. She is normal weight. She  is not ill-appearing.     Comments: Very pleasant female appears stated age sitting comfortably in wheelchair in no acute distress  HENT:     Head: Normocephalic and atraumatic.  Cardiovascular:     Rate and Rhythm: Normal rate and regular rhythm.     Heart sounds: Normal heart sounds, S1 normal and S2 normal. No murmur heard. Pulmonary:     Effort: Pulmonary effort is normal.     Breath sounds: Normal breath sounds. No wheezing, rhonchi or rales.     Comments: Clear to auscultation bilaterally Abdominal:     Palpations: Abdomen is soft.     Tenderness: There is no abdominal tenderness.   Musculoskeletal:     Cervical back: Normal, normal range of motion and neck supple. No tenderness or bony tenderness. No spinous process tenderness or muscular tenderness.     Thoracic back: Tenderness and bony tenderness present.       Back:     Comments: Significant tenderness palpation over right posterior lower ribs.  No deformity noted.  Neurological:     Comments: Cranial nerves II through XII intact  Psychiatric:        Behavior: Behavior is cooperative.     UC Treatments / Results  Labs (all labs ordered are listed, but only abnormal results are displayed) Labs Reviewed - No data to display  EKG   Radiology DG Ribs Unilateral W/Chest Right  Result Date: 12/21/2020 CLINICAL DATA:  Fall.  Right rib pain EXAM: RIGHT RIBS AND CHEST - 3+ VIEW COMPARISON:  11/11/2020 FINDINGS: Prior CABG. AICD in satisfactory position. Cardiac enlargement. Negative for heart failure. Lungs are clear without infiltrate or effusion Right rib detail is suboptimal. Question fracture lower lateral rib on the right. IMPRESSION: Question right lower rib fracture laterally. Suboptimal rib technique No acute cardiopulmonary abnormality. Electronically Signed   By: Franchot Gallo M.D.   On: 12/21/2020 19:21    Procedures Procedures (including critical care time)  Medications Ordered in UC Medications - No data to display  Initial Impression / Assessment and Plan / UC Course  I have reviewed the triage vital signs and the nursing notes.  Pertinent labs & imaging results that were available during my care of the patient were reviewed by me and considered in my medical decision making (see chart for details).      X-ray showed possible right lower rib fracture.  Discussed with patient and caregiver that there is not much that can be done about this.  She already has a brace and was encouraged to use this regularly.  We discussed pain management but given high fall risk we deferred use of opioids in  favor of Tylenol and ibuprofen.  Discussed the importance of pulmonary hygiene I recommended she take big deep breaths and use incentive spirometer to help prevent atelectasis.  Discussed alarm symptoms that warrant emergent evaluation.  Patient has follow-up scheduled with PCP with strong encouraged to keep this appointment.  Strict return precautions given with patient expressed understanding.  Final Clinical Impressions(s) / UC Diagnoses   Final diagnoses:  Fall, initial encounter  Rib pain on right side  Closed fracture of one rib of right side, initial encounter     Discharge Instructions      Alternate Tylenol and ibuprofen for pain.  Continue with your brace as previously prescribed.  As we discussed, it is important that you take big deep breaths.  You may want to buy a incentive spirometer from [employer] or the pharmacy  to help monitor her lung function.  If anything worsens please return for reevaluation.  Follow-up with primary care as scheduled.     ED Prescriptions   None    PDMP not reviewed this encounter.   Terrilee Croak, PA-C 12/21/20 1935

## 2020-12-21 NOTE — Discharge Instructions (Addendum)
Alternate Tylenol and ibuprofen for pain.  Continue with your brace as previously prescribed.  As we discussed, it is important that you take big deep breaths.  You may want to buy a incentive spirometer from [employer] or the pharmacy to help monitor her lung function.  If anything worsens please return for reevaluation.  Follow-up with primary care as scheduled.

## 2021-01-07 ENCOUNTER — Inpatient Hospital Stay (HOSPITAL_COMMUNITY)
Admission: EM | Admit: 2021-01-07 | Discharge: 2021-01-11 | DRG: 054 | Disposition: A | Discharge status: Hospice | Payer: No Typology Code available for payment source | Attending: Internal Medicine | Admitting: Internal Medicine

## 2021-01-07 ENCOUNTER — Other Ambulatory Visit: Payer: Self-pay

## 2021-01-07 DIAGNOSIS — C7931 Secondary malignant neoplasm of brain: Principal | ICD-10-CM | POA: Diagnosis present

## 2021-01-07 DIAGNOSIS — G8194 Hemiplegia, unspecified affecting left nondominant side: Secondary | ICD-10-CM

## 2021-01-07 DIAGNOSIS — C7951 Secondary malignant neoplasm of bone: Secondary | ICD-10-CM | POA: Diagnosis present

## 2021-01-07 DIAGNOSIS — Z515 Encounter for palliative care: Secondary | ICD-10-CM

## 2021-01-07 DIAGNOSIS — G936 Cerebral edema: Secondary | ICD-10-CM | POA: Diagnosis present

## 2021-01-07 DIAGNOSIS — I4891 Unspecified atrial fibrillation: Secondary | ICD-10-CM | POA: Diagnosis present

## 2021-01-07 DIAGNOSIS — R471 Dysarthria and anarthria: Secondary | ICD-10-CM | POA: Diagnosis present

## 2021-01-07 DIAGNOSIS — M810 Age-related osteoporosis without current pathological fracture: Secondary | ICD-10-CM | POA: Diagnosis present

## 2021-01-07 DIAGNOSIS — Z803 Family history of malignant neoplasm of breast: Secondary | ICD-10-CM

## 2021-01-07 DIAGNOSIS — Z20822 Contact with and (suspected) exposure to covid-19: Secondary | ICD-10-CM | POA: Diagnosis present

## 2021-01-07 DIAGNOSIS — R569 Unspecified convulsions: Secondary | ICD-10-CM | POA: Diagnosis present

## 2021-01-07 DIAGNOSIS — R29898 Other symptoms and signs involving the musculoskeletal system: Secondary | ICD-10-CM

## 2021-01-07 DIAGNOSIS — Z833 Family history of diabetes mellitus: Secondary | ICD-10-CM

## 2021-01-07 DIAGNOSIS — Z853 Personal history of malignant neoplasm of breast: Secondary | ICD-10-CM

## 2021-01-07 DIAGNOSIS — Z8249 Family history of ischemic heart disease and other diseases of the circulatory system: Secondary | ICD-10-CM

## 2021-01-07 DIAGNOSIS — Z95810 Presence of automatic (implantable) cardiac defibrillator: Secondary | ICD-10-CM

## 2021-01-07 DIAGNOSIS — R296 Repeated falls: Secondary | ICD-10-CM | POA: Diagnosis present

## 2021-01-07 DIAGNOSIS — R414 Neurologic neglect syndrome: Secondary | ICD-10-CM | POA: Diagnosis present

## 2021-01-07 DIAGNOSIS — Z923 Personal history of irradiation: Secondary | ICD-10-CM

## 2021-01-07 DIAGNOSIS — Z7982 Long term (current) use of aspirin: Secondary | ICD-10-CM

## 2021-01-07 DIAGNOSIS — C3432 Malignant neoplasm of lower lobe, left bronchus or lung: Secondary | ICD-10-CM | POA: Diagnosis present

## 2021-01-07 DIAGNOSIS — Z87891 Personal history of nicotine dependence: Secondary | ICD-10-CM

## 2021-01-07 DIAGNOSIS — R531 Weakness: Secondary | ICD-10-CM | POA: Diagnosis present

## 2021-01-07 DIAGNOSIS — Z66 Do not resuscitate: Secondary | ICD-10-CM | POA: Diagnosis present

## 2021-01-07 DIAGNOSIS — Z681 Body mass index (BMI) 19 or less, adult: Secondary | ICD-10-CM

## 2021-01-07 DIAGNOSIS — Z79899 Other long term (current) drug therapy: Secondary | ICD-10-CM

## 2021-01-07 DIAGNOSIS — Z7983 Long term (current) use of bisphosphonates: Secondary | ICD-10-CM

## 2021-01-07 DIAGNOSIS — I2510 Atherosclerotic heart disease of native coronary artery without angina pectoris: Secondary | ICD-10-CM | POA: Diagnosis present

## 2021-01-07 DIAGNOSIS — I110 Hypertensive heart disease with heart failure: Secondary | ICD-10-CM | POA: Diagnosis present

## 2021-01-07 DIAGNOSIS — M7989 Other specified soft tissue disorders: Secondary | ICD-10-CM | POA: Diagnosis present

## 2021-01-07 DIAGNOSIS — R64 Cachexia: Secondary | ICD-10-CM | POA: Diagnosis present

## 2021-01-07 DIAGNOSIS — I5022 Chronic systolic (congestive) heart failure: Secondary | ICD-10-CM | POA: Diagnosis present

## 2021-01-07 DIAGNOSIS — Z951 Presence of aortocoronary bypass graft: Secondary | ICD-10-CM

## 2021-01-07 LAB — CBC WITH DIFFERENTIAL/PLATELET
Abs Immature Granulocytes: 0.03 10*3/uL (ref 0.00–0.07)
Basophils Absolute: 0.1 10*3/uL (ref 0.0–0.1)
Basophils Relative: 2 %
Eosinophils Absolute: 0.3 10*3/uL (ref 0.0–0.5)
Eosinophils Relative: 5 %
HCT: 29.8 % — ABNORMAL LOW (ref 36.0–46.0)
Hemoglobin: 9.5 g/dL — ABNORMAL LOW (ref 12.0–15.0)
Immature Granulocytes: 0 %
Lymphocytes Relative: 32 %
Lymphs Abs: 2.1 10*3/uL (ref 0.7–4.0)
MCH: 31.1 pg (ref 26.0–34.0)
MCHC: 31.9 g/dL (ref 30.0–36.0)
MCV: 97.7 fL (ref 80.0–100.0)
Monocytes Absolute: 0.6 10*3/uL (ref 0.1–1.0)
Monocytes Relative: 9 %
Neutro Abs: 3.5 10*3/uL (ref 1.7–7.7)
Neutrophils Relative %: 52 %
Platelets: 236 10*3/uL (ref 150–400)
RBC: 3.05 MIL/uL — ABNORMAL LOW (ref 3.87–5.11)
RDW: 14.8 % (ref 11.5–15.5)
WBC: 6.7 10*3/uL (ref 4.0–10.5)
nRBC: 0 % (ref 0.0–0.2)

## 2021-01-07 LAB — COMPREHENSIVE METABOLIC PANEL
ALT: 12 U/L (ref 0–44)
AST: 22 U/L (ref 15–41)
Albumin: 3.8 g/dL (ref 3.5–5.0)
Alkaline Phosphatase: 77 U/L (ref 38–126)
Anion gap: 10 (ref 5–15)
BUN: 27 mg/dL — ABNORMAL HIGH (ref 8–23)
CO2: 23 mmol/L (ref 22–32)
Calcium: 9.9 mg/dL (ref 8.9–10.3)
Chloride: 107 mmol/L (ref 98–111)
Creatinine, Ser: 1.03 mg/dL — ABNORMAL HIGH (ref 0.44–1.00)
GFR, Estimated: 57 mL/min — ABNORMAL LOW (ref >60)
Glucose, Bld: 96 mg/dL (ref 70–99)
Potassium: 4 mmol/L (ref 3.5–5.1)
Sodium: 140 mmol/L (ref 135–145)
Total Bilirubin: 0.9 mg/dL (ref 0.3–1.2)
Total Protein: 7.7 g/dL (ref 6.5–8.1)

## 2021-01-07 LAB — BRAIN NATRIURETIC PEPTIDE: B Natriuretic Peptide: 331.9 pg/mL — ABNORMAL HIGH (ref 0.0–100.0)

## 2021-01-07 NOTE — ED Triage Notes (Signed)
C/o left hand and lower extremities swelling. Caregiver at bedside stated pain when liftin gleft arm as well

## 2021-01-07 NOTE — ED Provider Notes (Signed)
Emergency Medicine Provider Triage Evaluation Note  Dorothy Li , a 75 y.o. female  was evaluated in triage.  Pt complains of swelling to bilateral ankles left.  Swelling has is.  Patient also has difficulty lifting left arm.  This has been present since her last fall 6/19.  Patient caregiver at bedside states that she had a full work-up at that time.  Review of Systems  Positive: Swelling Negative: Shortness of breath, chest pain, back pain, neck pain, bowel or bladder dysfunction, saddle seizure  Physical Exam  BP (!) 151/86 (BP Location: Right Arm)   Pulse 86   Temp 98.3 F (36.8 C) (Oral)   Resp 18   Ht 5\' 8"  (1.727 m)   Wt 52.1 kg   SpO2 97%   BMI 17.46 kg/m  Gen:   Awake, no distress   Resp:  Normal effort  MSK:   Patient has difficulty moving left upper extremity.  Minimal swelling left hand.  +1 edema to bilateral ankles Other:    Medical Decision Making  Medically screening exam initiated at 8:10 PM.  Appropriate orders placed.  Dorothy Li was informed that the remainder of the evaluation will be completed by another provider, this initial triage assessment does not replace that evaluation, and the importance of remaining in the ED until their evaluation is complete.  The patient appears stable so that the remainder of the work up may be completed by another provider.      Loni Beckwith, PA-C 01/07/21 2011    Lorelle Gibbs, DO 01/08/21 0010

## 2021-01-08 ENCOUNTER — Emergency Department (HOSPITAL_COMMUNITY): Payer: No Typology Code available for payment source

## 2021-01-08 DIAGNOSIS — R414 Neurologic neglect syndrome: Secondary | ICD-10-CM | POA: Diagnosis present

## 2021-01-08 DIAGNOSIS — I2510 Atherosclerotic heart disease of native coronary artery without angina pectoris: Secondary | ICD-10-CM | POA: Diagnosis present

## 2021-01-08 DIAGNOSIS — R569 Unspecified convulsions: Secondary | ICD-10-CM | POA: Diagnosis present

## 2021-01-08 DIAGNOSIS — G936 Cerebral edema: Secondary | ICD-10-CM | POA: Diagnosis present

## 2021-01-08 DIAGNOSIS — I4891 Unspecified atrial fibrillation: Secondary | ICD-10-CM | POA: Diagnosis present

## 2021-01-08 DIAGNOSIS — Z20822 Contact with and (suspected) exposure to covid-19: Secondary | ICD-10-CM | POA: Diagnosis present

## 2021-01-08 DIAGNOSIS — R296 Repeated falls: Secondary | ICD-10-CM | POA: Diagnosis present

## 2021-01-08 DIAGNOSIS — Z95810 Presence of automatic (implantable) cardiac defibrillator: Secondary | ICD-10-CM | POA: Diagnosis not present

## 2021-01-08 DIAGNOSIS — M7989 Other specified soft tissue disorders: Secondary | ICD-10-CM | POA: Diagnosis present

## 2021-01-08 DIAGNOSIS — I5022 Chronic systolic (congestive) heart failure: Secondary | ICD-10-CM | POA: Diagnosis present

## 2021-01-08 DIAGNOSIS — C3490 Malignant neoplasm of unspecified part of unspecified bronchus or lung: Secondary | ICD-10-CM | POA: Diagnosis not present

## 2021-01-08 DIAGNOSIS — Z853 Personal history of malignant neoplasm of breast: Secondary | ICD-10-CM | POA: Diagnosis not present

## 2021-01-08 DIAGNOSIS — C3432 Malignant neoplasm of lower lobe, left bronchus or lung: Secondary | ICD-10-CM | POA: Diagnosis present

## 2021-01-08 DIAGNOSIS — Z681 Body mass index (BMI) 19 or less, adult: Secondary | ICD-10-CM | POA: Diagnosis not present

## 2021-01-08 DIAGNOSIS — Z923 Personal history of irradiation: Secondary | ICD-10-CM | POA: Diagnosis not present

## 2021-01-08 DIAGNOSIS — Z66 Do not resuscitate: Secondary | ICD-10-CM | POA: Diagnosis present

## 2021-01-08 DIAGNOSIS — C7931 Secondary malignant neoplasm of brain: Secondary | ICD-10-CM | POA: Diagnosis present

## 2021-01-08 DIAGNOSIS — I110 Hypertensive heart disease with heart failure: Secondary | ICD-10-CM | POA: Diagnosis present

## 2021-01-08 DIAGNOSIS — Z515 Encounter for palliative care: Secondary | ICD-10-CM | POA: Diagnosis not present

## 2021-01-08 DIAGNOSIS — R64 Cachexia: Secondary | ICD-10-CM | POA: Diagnosis present

## 2021-01-08 DIAGNOSIS — M810 Age-related osteoporosis without current pathological fracture: Secondary | ICD-10-CM | POA: Diagnosis present

## 2021-01-08 DIAGNOSIS — C7951 Secondary malignant neoplasm of bone: Secondary | ICD-10-CM | POA: Diagnosis present

## 2021-01-08 DIAGNOSIS — R531 Weakness: Secondary | ICD-10-CM | POA: Diagnosis present

## 2021-01-08 DIAGNOSIS — G8194 Hemiplegia, unspecified affecting left nondominant side: Secondary | ICD-10-CM | POA: Diagnosis present

## 2021-01-08 DIAGNOSIS — R471 Dysarthria and anarthria: Secondary | ICD-10-CM | POA: Diagnosis present

## 2021-01-08 LAB — URINALYSIS, ROUTINE W REFLEX MICROSCOPIC
Bacteria, UA: NONE SEEN
Bilirubin Urine: NEGATIVE
Glucose, UA: NEGATIVE mg/dL
Ketones, ur: NEGATIVE mg/dL
Leukocytes,Ua: NEGATIVE
Nitrite: NEGATIVE
Protein, ur: NEGATIVE mg/dL
Specific Gravity, Urine: 1.006 (ref 1.005–1.030)
pH: 7 (ref 5.0–8.0)

## 2021-01-08 LAB — APTT: aPTT: 29 seconds (ref 24–36)

## 2021-01-08 LAB — RESP PANEL BY RT-PCR (FLU A&B, COVID) ARPGX2
Influenza A by PCR: NEGATIVE
Influenza B by PCR: NEGATIVE
SARS Coronavirus 2 by RT PCR: NEGATIVE

## 2021-01-08 LAB — MAGNESIUM: Magnesium: 1.5 mg/dL — ABNORMAL LOW (ref 1.7–2.4)

## 2021-01-08 LAB — PROTIME-INR
INR: 1.4 — ABNORMAL HIGH (ref 0.8–1.2)
Prothrombin Time: 17.5 seconds — ABNORMAL HIGH (ref 11.4–15.2)

## 2021-01-08 LAB — TSH: TSH: 1.422 u[IU]/mL (ref 0.350–4.500)

## 2021-01-08 LAB — GLUCOSE, CAPILLARY: Glucose-Capillary: 98 mg/dL (ref 70–99)

## 2021-01-08 LAB — PHOSPHORUS: Phosphorus: 4.5 mg/dL (ref 2.5–4.6)

## 2021-01-08 MED ORDER — ENOXAPARIN SODIUM 40 MG/0.4ML IJ SOSY
40.0000 mg | PREFILLED_SYRINGE | INTRAMUSCULAR | Status: DC
  Administered 2021-01-08 – 2021-01-09 (×2): 40 mg via SUBCUTANEOUS
  Filled 2021-01-08 (×2): qty 0.4

## 2021-01-08 MED ORDER — LEVETIRACETAM 500 MG PO TABS
500.0000 mg | ORAL_TABLET | Freq: Two times a day (BID) | ORAL | Status: DC
  Administered 2021-01-08 – 2021-01-09 (×2): 500 mg via ORAL
  Filled 2021-01-08 (×2): qty 1

## 2021-01-08 MED ORDER — CARVEDILOL 6.25 MG PO TABS
6.2500 mg | ORAL_TABLET | Freq: Two times a day (BID) | ORAL | Status: DC
  Administered 2021-01-08 – 2021-01-11 (×5): 6.25 mg via ORAL
  Filled 2021-01-08: qty 1
  Filled 2021-01-08: qty 2
  Filled 2021-01-08 (×3): qty 1

## 2021-01-08 MED ORDER — LEVETIRACETAM IN NACL 1500 MG/100ML IV SOLN
1500.0000 mg | Freq: Once | INTRAVENOUS | Status: AC
  Administered 2021-01-08: 1500 mg via INTRAVENOUS
  Filled 2021-01-08: qty 100

## 2021-01-08 MED ORDER — ASPIRIN EC 81 MG PO TBEC
81.0000 mg | DELAYED_RELEASE_TABLET | Freq: Every day | ORAL | Status: DC
  Administered 2021-01-08 – 2021-01-09 (×2): 81 mg via ORAL
  Filled 2021-01-08 (×3): qty 1

## 2021-01-08 MED ORDER — ACETAMINOPHEN 650 MG RE SUPP: 650.0000 mg | Freq: Four times a day (QID) | RECTAL | Status: DC | PRN

## 2021-01-08 MED ORDER — SODIUM CHLORIDE 0.9% FLUSH
3.0000 mL | Freq: Two times a day (BID) | INTRAVENOUS | Status: DC
  Administered 2021-01-08 – 2021-01-11 (×6): 3 mL via INTRAVENOUS

## 2021-01-08 MED ORDER — FUROSEMIDE 10 MG/ML IJ SOLN
20.0000 mg | Freq: Once | INTRAMUSCULAR | Status: AC
  Administered 2021-01-08: 20 mg via INTRAVENOUS
  Filled 2021-01-08: qty 2

## 2021-01-08 MED ORDER — LORAZEPAM 2 MG/ML IJ SOLN
INTRAMUSCULAR | Status: AC
  Administered 2021-01-08: 2 mg
  Filled 2021-01-08: qty 1

## 2021-01-08 MED ORDER — ACETAMINOPHEN 325 MG PO TABS: 650.0000 mg | ORAL_TABLET | Freq: Four times a day (QID) | ORAL | Status: DC | PRN

## 2021-01-08 NOTE — ED Notes (Signed)
This RN observed repetitive jerking of L arm for about 2 minutes. Pt did remain verbal but this RN asked Dr. Langston Masker to evaluate patient for possible seizure. Received verbal order to admin 2 mg of ativan. About 1 minute post Ativan admin all arm jerking stopped and patient now resting.

## 2021-01-08 NOTE — ED Provider Notes (Signed)
[facility] EMERGENCY DEPARTMENT Provider Note   CSN: [id number] Arrival date & time: 01/07/21  1751     History Chief Complaint  Patient presents with   Leg Swelling    Dorothy Li is a 75 y.o. female with history of coronary disease, cardiomyopathy, last echo in April 2022 with EF 30%, presenting to ED with complaint of left arm pain and swelling.  Patient is a poor historian.  Her niece at bedside reports the left arm symptoms began about 4 days ago.  If noted unilateral swelling in that arm, as well as weakness.  The patient says she cannot even left arm.  He denies any prior history of stroke or TIA.  The patient also complains of bilateral symmetrical pitting edema in her lower extremities.  She reports some very mild shortness of breath.  No chest pain or pressure.  She is not on Lasix or any diuretics at home per her recollection or medication list.  She follows at the [facility] hospital.  She said they tried to call them yesterday and were told to come to the ER for an evaluation.  No history of blood clots they are aware of.  She is not on blood thinners.  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  Patient had CABG with LIMA-LAD and SVG-OM2.    Cardiomyopathy    Echo (4/11) with EF 25%, diffuse hypokinesis, moderate LV dilation, decreased RV  systolic function but normal RV size, PA systolic pressure 35 mmHg. Lexiscan myoview (4/11): EF 29%,  diffuse hypokinesis, basal anteroseptal and apical scar, no ischemia.  The cardiomyopathy is likely due to a combination of CAD (LM stenosis) and ETOH abuse.    Carotid stenosis    carotid  dopplers (4/25) with 95-63% LICA stenosis.  Carotid dopplers (87/56): 43-32%  LICA stenosis.   ETOH abuse     Elevated AST, mildly elevated INR (? component of cirrhosis). Has now quit ETOH.    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 Vangie Bicker   Syncope    none since ICD placement   Use of anastrozole (Arimidex) 05/12/12   VT (ventricular tachycardia) ([place])    Dual chamber Medtronic ICD placed 7/11    Patient Active Problem List   Diagnosis Date Noted   Dehydration 11/11/2020   Protein-calorie malnutrition, severe 10/30/2020   Pneumonia 10/29/2020   CAD (coronary artery disease) 10/29/2020   Prolonged QT interval 10/29/2020   Parapneumonic effusion 10/29/2020   Multiple falls 10/16/2020   Hypokalemia 10/15/2020   Hypomagnesemia 10/15/2020   EtOH dependence ([place]) 10/15/2020   FTT (failure to thrive) in adult 10/15/2020   Colon polyps 04/11/2012   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   Breast cancer, right  breast ([place]) 12/08/2011   Breast cancer ([place]) 12/08/2011   Orthostatic dizziness 03/10/2011   ARF (acute renal failure) ([place]) 02/04/2011   Hypotension 01/26/2011   Pure hypercholesterolemia 07/30/2010   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   CAROTID ARTERY STENOSIS 02/11/2010   CORONARY ATHEROSLERO UNSPEC TYPE BYPASS GRAFT 11/11/2009   CAROTID BRUIT 11/11/2009   Nonischemic cardiomyopathy/ CAD-CABG 10/08/2009   Chronic combined systolic and diastolic CHF (congestive heart failure) ([place]) 10/08/2009   VENTRICULAR TACHYCARDIA 09/16/2009   Essential hypertension 09/05/2009   SYNCOPE 09/05/2009    Past Surgical History:   Procedure Laterality Date   ABDOMINAL HYSTERECTOMY     TAH-BSO, fibroids, age 46   BREAST BIOPSY  12/08/11   Right Breast, Upper Outer Quadrant: Invasive Ductal:DCIS   BREAST LUMPECTOMY  01-19-12   right lumpectomy/   CARDIAC CATHETERIZATION     COLONOSCOPY  04/11/2012   Procedure: COLONOSCOPY;  Surgeon: Jerene Bears, MD;  Location: [facility] END[facility];  Service: Gastroenterology;  Laterality: N/A;   CORONARY ARTERY BYPASS GRAFT  4/11    x 2   ICD implantation  2011   ICD-Medtronic    Remote - No /  Hx of VT, LU chest     OB History     Gravida  1   Para      Term      Preterm      AB      Living         SAB      IAB      Ectopic      Multiple      Live Births           Obstetric Comments  3 pregnancies and 0 births         Family History  Problem Relation Age of Onset   Heart disease Sister        Had surgery   Heart failure Sister    Diabetes Mother    Breast cancer Cousin    Colon cancer Neg Hx     Social History   Tobacco Use   Smoking status: Former    Packs/day: 1.00    Years: 40.00    Pack years: 40.00    Types: Cigarettes    Quit date: 10/08/2009    Years since quitting: 11.2   Smokeless tobacco: Never  Substance Use Topics   Alcohol use: Yes    Comment: Prior hx 2 drinks daily.  quit 09/2009   Drug use: No    Home Medications Prior to Admission medications   Medication Sig Start Date End Date Taking? Authorizing Provider  acetaminophen (TYLENOL) 325 MG tablet Take 650 mg by mouth every 6 (six) hours as needed for moderate pain or fever.   Yes [provider]  alendronate (FOSAMAX) 70 MG tablet Take 70 mg by mouth every Monday. Take with a full glass of water on an empty stomach.   Yes [provider]  aspirin EC 81 MG tablet Take 1 tablet (81 mg total) by mouth daily. 04/08/11  Yes Larey Dresser, MD  Calcium-Vitamin D-Vitamin K [phone] MG-UNT-MCG TABS Take 1 tablet by mouth daily.   Yes [provider]   carvedilol (COREG) 6.25 MG tablet Take 6.25 mg by mouth 2 (two) times daily with a meal.   Yes [provider]  Cholecalciferol (VITAMIN D3) 3000 UNITS TABS Take 3,000 Units by mouth daily.   Yes [provider]  feeding supplement (ENSURE ENLIVE /  ENSURE PLUS) LIQD Take 237 mLs by mouth 3 (three) times daily between meals. 10/30/20  Yes Antonieta Pert, MD  ibuprofen (ADVIL) 200 MG tablet Take 200 mg by mouth every 6 (six) hours as needed for headache or mild pain.   Yes [provider]  LACTOBACILLUS PROBIOTIC PO Take 1 capsule by mouth daily.   Yes [provider]  Multiple Vitamins-Minerals (MULTIVITAMIN WITH MINERALS) tablet Take 1 tablet by mouth daily.   Yes [provider]  pantoprazole (PROTONIX) 40 MG tablet Take 40 mg by mouth 2 (two) times daily before a meal.   Yes [provider]  sertraline (ZOLOFT) 100 MG tablet Take 100 mg by mouth daily.   Yes [provider]  traZODone (DESYREL) 50 MG tablet Take 50 mg by mouth at bedtime. 06/02/15  Yes [provider]    Allergies    Penicillins, Latex, and Tape  Review of Systems   Review of Systems  Constitutional:  Negative for chills and fever.  Eyes:  Negative for pain and visual disturbance.  Respiratory:  Positive for shortness of breath. Negative for cough.   Cardiovascular:  Positive for leg swelling. Negative for chest pain and palpitations.  Gastrointestinal:  Negative for abdominal pain and vomiting.  Genitourinary:  Negative for dysuria and hematuria.  Musculoskeletal:  Negative for arthralgias and back pain.  Skin:  Negative for color change and rash.  Neurological:  Positive for weakness and numbness. Negative for light-headedness and headaches.  All other systems reviewed and are negative.  Physical Exam Updated Vital Signs BP (!) 140/110   Pulse (!) 105   Temp 98.3 F (36.8 C) (Oral)   Resp 20   Ht 5\' 8"  (1.727 m)   Wt 52.1 kg   SpO2 100%    BMI 17.46 kg/m   Physical Exam Constitutional:      General: She is not in acute distress.    Comments: Appears chronically ill  HENT:     Head: Normocephalic and atraumatic.  Eyes:     Conjunctiva/sclera: Conjunctivae normal.     Pupils: Pupils are equal, round, and reactive to light.  Cardiovascular:     Rate and Rhythm: Normal rate and regular rhythm.     Pulses: Normal pulses.  Pulmonary:     Effort: Pulmonary effort is normal. No respiratory distress.     Breath sounds: No wheezing.  Abdominal:     General: There is no distension.     Tenderness: There is no abdominal tenderness.  Musculoskeletal:     Comments: Mild +1 pitting edema symmetrical in lower extremities Unilateral edema of entire left upper arm (compared to right)  Skin:    General: Skin is warm and dry.  Neurological:     General: No focal deficit present.     Mental Status: She is alert. Mental status is at baseline.     GCS: GCS eye subscore is 4. GCS verbal subscore is 5. GCS motor subscore is 6.     Cranial Nerves: Cranial nerves are intact.     Comments: Paresthesias in entire left arm 1/5 strength in left arm to grip, wrist flexion, elbow, and shoulder abduction Normal strength in other extremities    ED Results / Procedures / Treatments   Labs (all labs ordered are listed, but only abnormal results are displayed) Labs Reviewed  COMPREHENSIVE METABOLIC PANEL - Abnormal; Notable for the following components:      Result Value   BUN 27 (*)  Creatinine, Ser 1.03 (*)    GFR, Estimated 57 (*)    All other components within normal limits  CBC WITH DIFFERENTIAL/PLATELET - Abnormal; Notable for the following components:   RBC 3.05 (*)    Hemoglobin 9.5 (*)    HCT 29.8 (*)    All other components within normal limits  BRAIN NATRIURETIC PEPTIDE - Abnormal; Notable for the following components:   B Natriuretic Peptide 331.9 (*)    All other components within normal limits  RESP PANEL BY RT-PCR (FLU  A&B, COVID) ARPGX2  URINALYSIS, ROUTINE W REFLEX MICROSCOPIC    EKG None  Radiology CT Head Wo Contrast  Result Date: 01/08/2021 CLINICAL DATA:  Neuro deficit, acute, stroke suspected. Left arm weakness for 4 days. Altered mental status. EXAM: CT HEAD WITHOUT CONTRAST TECHNIQUE: Contiguous axial images were obtained from the base of the skull through the vertex without intravenous contrast. COMPARISON:  Prior head CT 11/11/2020. FINDINGS: Brain: Cerebral volume is normal for age. There is extensive white matter, and to a lesser degree cortical, edema within the posterior frontal lobes and parietal lobes (right greater than left). Subtle patchy abnormal hypodensity is also questioned within the bilateral occipital lobes. Additional cortical/subcortical hypodensity within the medial right temporal lobe/hippocampus. Suggestion of a 9 mm superimposed medial right temporal lobe mass (series 4, image 14). No evidence of acute intracranial hemorrhage. No extra-axial fluid collection. No midline shift. Vascular: No hyperdense vessel.  Atherosclerotic calcifications. Skull: Normal. Negative for fracture or focal lesion. Sinuses/Orbits: Visualized orbits show no acute finding. 2.1 cm right maxillary sinus mucous retention cyst. Mild left maxillary sinus mucosal thickening. Trace right frontal and bilateral ethmoid sinus mucosal thickening. These results were called by telephone at the time of interpretation on 01/08/2021 at 9:48 am to provider [facility] , who verbally acknowledged these results. IMPRESSION: Extensive white matter, and to a lesser degree cortical, edema within the posterior frontal lobes and parietal lobes (right greater than left). Subtle patchy abnormal hypodensity is also questioned within the bilateral occipital lobes. 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 However, underlying metastatic lesions are also a consideration and a contrast-enhanced brain MRI  is recommended for further evaluation. Additional cortical/subcortical hypodensity within the medial right temporal lobe/hippocampus. A superimposed 9 mm medial right temporal lobe mass is also questioned, and this may reflect edema related to a metastatic lesion. However, alternative etiologies such as encephalitis (including herpes encephalitis), seizure related edema or acute right PCA territory ischemia cannot be excluded. This too would be better characterized with a contrast-enhanced brain MRI. No midline shift or herniation. Paranasal sinus disease, as described. Electronically Signed   By: Kellie Simmering DO   On: 01/08/2021 09:48   EEG adult  Result Date: 01/08/2021 Lora Havens, MD     01/08/2021 12:40 PM Patient Name: ABIGAIL TEALL MRN: [id number] Epilepsy Attending: Lora Havens Referring Physician/Provider: Anibal Henderson, NP Date: 01/08/2021 Duration: 23.48 mins Patient history: 75 year old female with history of right breast cancer status posttreatment who was noted to have left arm weakness and twitching.  EEG to evaluate for seizures. Level of alertness: Awake, asleep AEDs during EEG study: Ativan, Keppra Technical aspects: This EEG study was done with scalp electrodes positioned according to the 10-20 International system of electrode placement. Electrical activity was acquired at a sampling rate of 500Hz  and reviewed with a high frequency filter of 70Hz  and a low frequency filter of 1Hz . EEG data were recorded continuously and digitally stored. Description: The posterior  dominant rhythm consists of 8-9 Hz activity of moderate voltage (25-35 uV) seen predominantly in posterior head regions, symmetric and reactive to eye opening and eye closing. Sleep was characterized by vertex waves, sleep spindles (12 to 14 Hz), maximal frontocentral region. EEG showed continuous generalized 5 to 6 Hz theta-delta slowing with overriding 15 to 18 Hz beta activity distributed symmetrically and diffusely.  Hyperventilation and photic stimulation were not performed.   ABNORMALITY - Continuous slow, generalized - Excessive beta, generalized IMPRESSION: This study is suggestive of mild to moderate diffuse encephalopathy, nonspecific etiology. No seizures or epileptiform discharges were seen throughout the recording. Priyanka Barbra Sarks    Procedures Procedures   Medications Ordered in ED Medications  carvedilol (COREG) tablet 6.25 mg (0 mg Oral Hold 01/08/21 0855)  furosemide (LASIX) injection 20 mg (20 mg Intravenous Given 01/08/21 0845)  LORazepam (ATIVAN) 2 MG/ML injection (2 mg  Given 01/08/21 0842)  levETIRAcetam (KEPPRA) IVPB 1500 mg/ 100 mL premix (0 mg Intravenous Stopped 01/08/21 1151)    ED Course  I have reviewed the triage vital signs and the nursing notes.  Pertinent labs & imaging results that were available during my care of the patient were reviewed by me and considered in my medical decision making (see chart for details).  Patient is here with unilateral left arm swelling, weakness for about 4 days.  Differential includes stroke versus DVT versus other.  Unfortunately the patient did have a prolonged wait in the waiting room overnight after MSE and triage, approximately 14 hours prior to my evaluation at the bedside at the start of my shift.  However, even in the setting of a possible stroke, the patient is now 4 days out from symptom onset, would not likely be a candidate for thrombolytics or vascular intervention.  She also has a history of heart failure and is some very mild lower extremity pitting edema.  We can give her a dose of 20 mg IV Lasix for this.  I believe from this perspective of heart failure she is stable, not requiring oxygen, does not appear grossly volume overloaded.  I reviewed her labs with a BNP of 331.  Her CMP and CBC are largely unremarkable and close to her baseline levels.  No significant anemia.  No leukocytosis.  No significant AKI.  I have ordered her a CT  scan of the head as well as DVT study left upper arm.  I personally reviewed her prior medical records including the echocardiogram as noted above.  Supplemental history was provided by her family member at bedside.  The patient reportedly lives with her husband, who is blind and requires more constant care.  Clinical Course as of 01/08/21 1639  Thu Jan 08, 2021  3810 Was called to the room as the patient was having rhythmic jerking of her left arm.  Her head was turned to the left and her eyes gaze was deviated to the left.  She was awake and able to respond during this episode.  She seemed to be having clonic jerking of just the left arm.  Advised the nurses to go ahead and give 2 mg of IV Ativan.  We will need a CT scan.  Her niece at bedside reports that she has never seen this behavior before and is unaware of any history of seizure. [MT]  [phone] Cerebral edema noted on CT head, neurology consulted, spoke to Dr Leonel Ramsay who will come evaluate patient. [MT]  0950 BP remains stable, inconsistent with PRES.  Patient  had denied HA earlier. Now she is sedated after ativan. [MT]  9166 Patient seen by neurology who has ordered an MRI of the brain.  They have also ordered Keppra and an EEG.  She will need hospitalization at this point.  It is not clear what the central process is in the brain causing this edema, which may be due to cancer or another cause.  I have paged for admission.  Her blood pressures remained stable throughout her stay in the emergency department, within normal limits, and less suggestive of hypertensive emergency or PRES at this time. [MT]  0600 Patient signed out to internal medicine service. [MT]    Clinical Course User Index [MT] Brighid Koch, Carola Rhine, MD    Final Clinical Impression(s) / ED Diagnoses Final diagnoses:  Cerebral edema ([place])  Seizure ([place])  Left arm weakness    Rx / DC Orders ED Discharge Orders     None        [place] Jr, Carola Rhine, MD 01/08/21  [phone]

## 2021-01-08 NOTE — Procedures (Signed)
Patient Name: [name]  MRN: [id number]  Epilepsy Attending: Lora Havens  Referring Physician/Provider: Anibal Henderson, NP Date: 01/08/2021 Duration: 23.48 mins  Patient history: 75 year old female with history of right breast cancer status posttreatment who was noted to have left arm weakness and twitching.  EEG to evaluate for seizures.  Level of alertness: Awake, asleep  AEDs during EEG study: Ativan, Keppra  Technical aspects: This EEG study was done with scalp electrodes positioned according to the 10-20 International system of electrode placement. Electrical activity was acquired at a sampling rate of 500Hz  and reviewed with a high frequency filter of 70Hz  and a low frequency filter of 1Hz . EEG data were recorded continuously and digitally stored.   Description: The posterior dominant rhythm consists of 8-9 Hz activity of moderate voltage (25-35 uV) seen predominantly in posterior head regions, symmetric and reactive to eye opening and eye closing. Sleep was characterized by vertex waves, sleep spindles (12 to 14 Hz), maximal frontocentral region. EEG showed continuous generalized 5 to 6 Hz theta-delta slowing with overriding 15 to 18 Hz beta activity distributed symmetrically and diffusely. Hyperventilation and photic stimulation were not performed.     ABNORMALITY - Continuous slow, generalized - Excessive beta, generalized  IMPRESSION: This study is suggestive of mild to moderate diffuse encephalopathy, nonspecific etiology. No seizures or epileptiform discharges were seen throughout the recording.  Ison Wichmann Barbra Sarks

## 2021-01-08 NOTE — Consult Note (Signed)
Neurology Consultation  Reason for Consult: Left arm weakness, CTH with findings suspicious for PRES versus underlying metastatic lesions, concern for seizures Referring Physician: Dr. Langston Masker  CC: Left arm weakness, left arm twitching   History is obtained from: Chart review, Patient's family at bedside  HPI: Dorothy Li is a 75 y.o. female with a medical history significant for hypertension, hyperlipidemia, coronary artery disease, carotid stenosis, cardiomyopathy s/p ICD placement, right breast cancer s/p radiation treatment, frequent falls, and a remote history of ETOH abuse who presented to the ED for evaluation of bilateral lower extremity weakness and left arm weakness with reported onset since a mechanical fall on 12/21/2020 where work up at that time revealed a right lower rib fracture. Documentation at that time did not support left upper extremity weakness and she was discharged home. At home, the family noticed bilateral lower extremity edema with left arm weakness and swelling progressing over the past 4 days and brought Dorothy Li in for further evaluation on 01/07/2021. While in the waiting room, family reported that Dorothy Li had 2 episodes of left arm jerking and with increased intensity, she was noted to stare off and have slurred speech. Once roomed in the ED, the bedside RN noted left arm jerking and treated patient with 2 mg of Ativan with resolution of jerking. CT imaging was obtained revealing extensive white matter and some cortical edema within the posterior frontal and parietal lobes with possible subtle patchy abnormal hypodensity in bilateral occipital lobes that are suspicious for posterior reversible encephalopathy syndrome versus underlying metastatic lesions. Neurology was consulted for further evaluation and work up.   Per family at bedside, patient lives alone with her husband who is legally blind and requires a significant amount of help. Patient walks with a walker or a cane at  home and has had multiple recent falls. Family assists the patient and her husband with medication management. They are available to the patient every morning and afternoon and assist with making meals and bathing. Dorothy Li is able to use the restroom independently and was able to dress herself prior to the onset of her left upper extremity weakness.   ROS: Unable to obtain due to altered mental status.   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  Patient had CABG with LIMA-LAD and SVG-OM2.    Cardiomyopathy    Echo (4/11) with EF 25%, diffuse hypokinesis, moderate LV dilation, decreased RV  systolic function but normal RV size, PA systolic pressure 35 mmHg. Lexiscan myoview (4/11): EF 29%,  diffuse hypokinesis, basal anteroseptal and apical scar, no ischemia.  The cardiomyopathy is likely due to a combination of CAD (LM stenosis) and ETOH abuse.    Carotid stenosis    carotid dopplers (9/73) with 53-29% LICA stenosis.  Carotid dopplers (92/42): 68-34%  LICA stenosis.   ETOH abuse     Elevated AST, mildly elevated INR (? component of cirrhosis). Has now quit ETOH.    HTN (hypertension)    Hyperlipemia    ICD (implantable cardiac defibrillator) in place Medtronic   Interrogated on 11/18/11   Nephrolithiasis    Rash  from tape    S/P radiation therapy 03/22/12 - 05/02/12   Right Breast: 50 gray/25Fractions with Boost of 10gray/5 Fractions   Stress fracture of right foot 01/17/12   Dr. Vangie Bicker   Syncope    none since ICD placement   Use of anastrozole (Arimidex) 05/12/12   VT (ventricular tachycardia) ([place])    Dual chamber Medtronic ICD placed 7/11   Past  Surgical History:  Procedure Laterality Date   ABDOMINAL HYSTERECTOMY     TAH-BSO, fibroids, age 47   BREAST BIOPSY  12/08/11   Right Breast, Upper Outer Quadrant: Invasive Ductal:DCIS   BREAST LUMPECTOMY  01-19-12   right lumpectomy/   CARDIAC CATHETERIZATION     COLONOSCOPY  04/11/2012   Procedure: COLONOSCOPY;  Surgeon: Jerene Bears, MD;  Location: [facility];  Service: Gastroenterology;  Laterality: N/A;   CORONARY ARTERY BYPASS GRAFT  4/11    x 2   ICD implantation  2011   ICD-Medtronic    Remote - No /  Hx of VT, LU chest   Family History  Problem Relation Age of Onset   Heart disease Sister        Had surgery   Heart failure Sister    Diabetes Mother    Breast cancer Cousin    Colon cancer Neg Hx    Social History:   reports that she quit smoking about 11 years ago. Her smoking use included cigarettes. She has a 40.00 pack-year smoking history. She has never used smokeless tobacco. She reports current alcohol use. She reports that she does not use drugs.  Medications  Current Facility-Administered Medications:    carvedilol (COREG) tablet 6.25 mg, 6.25 mg, Oral, BID WC, Trifan, Carola Rhine, MD  Current Outpatient Medications:    acetaminophen (TYLENOL) 325 MG tablet, Take 650 mg by mouth every 6 (six) hours as needed for moderate pain or fever., Disp: , Rfl:    alendronate (FOSAMAX) 70 MG tablet, Take 70 mg by mouth every Monday. Take with a full glass of water on an empty stomach., Disp: , Rfl:    amoxicillin-clavulanate (AUGMENTIN) 875-125 MG tablet, Take 1 tablet by mouth 2 (two) times daily., Disp: , Rfl:    anastrozole (ARIMIDEX) 1 MG tablet, Take 1 tablet (1 mg total) by mouth daily. (Patient not taking: No sig reported), Disp: 90 tablet, Rfl: 8   aspirin EC 81 MG tablet, Take 1 tablet (81 mg total) by mouth daily., Disp: , Rfl:    atorvastatin (LIPITOR) 80 MG tablet, Take 40 mg by mouth at bedtime. (Patient not taking: No sig reported), Disp: , Rfl:     Calcium-Vitamin D-Vitamin K 750-500-40 MG-UNT-MCG TABS, Take 1 tablet by mouth daily., Disp: , Rfl:    carvedilol (COREG) 6.25 MG tablet, Take 6.25 mg by mouth 2 (two) times daily with a meal., Disp: , Rfl:    Cholecalciferol (VITAMIN D3) 3000 UNITS TABS, Take 3,000 Units by mouth daily., Disp: , Rfl:    feeding supplement (ENSURE ENLIVE / ENSURE PLUS) LIQD, Take 237 mLs by mouth 3 (three) times daily between meals. (Patient not taking: Reported on 11/11/2020), Disp: 237 mL, Rfl: 12   ibuprofen (ADVIL) 200 MG tablet, Take 200 mg by mouth every 6 (six) hours as needed for headache or mild pain., Disp: , Rfl:    LACTOBACILLUS PROBIOTIC PO, Take 1 capsule by mouth daily., Disp: , Rfl:    Multiple Vitamins-Minerals (MULTIVITAMIN WITH MINERALS) tablet, Take 1 tablet by mouth daily., Disp: , Rfl:  pantoprazole (PROTONIX) 40 MG tablet, Take 40 mg by mouth 2 (two) times daily before a meal., Disp: , Rfl:    sertraline (ZOLOFT) 100 MG tablet, Take 100 mg by mouth daily., Disp: , Rfl:    traZODone (DESYREL) 50 MG tablet, Take 50 mg by mouth at bedtime., Disp: , Rfl: 0  Exam: Current vital signs: BP 134/62   Pulse (!) 106   Temp 98.3 F (36.8 C) (Oral)   Resp 18   Ht 5\' 8"  (1.727 m)   Wt 52.1 kg   SpO2 99%   BMI 17.46 kg/m  Vital signs in last 24 hours: Temp:  [98.3 F (36.8 C)-98.4 F (36.9 C)] 98.3 F (36.8 C) (07/07 0218) Pulse Rate:  [86-107] 106 (07/07 1045) Resp:  [15-22] 18 (07/07 1045) BP: (122-151)/(56-88) 134/62 (07/07 1045) SpO2:  [94 %-100 %] 99 % (07/07 1045) Weight:  [52.1 kg] 52.1 kg (07/06 1956)  GENERAL: Drowsy 2/2 recent benzodiazepine use, laying in ER stretcher, in no acute distress.  Head: Normocephalic and atraumatic, without obvious abnormality EENT: Arcus senilis present bilaterally, normal conjunctivae, edentulous, dry mucous membranes LUNGS: Normal respiratory effort on room air, non-labored breathing CV: Tachycardia present on cardiac monitor, extremities well  perfused  ABDOMEN: Soft, non-tender Ext: warm, without obvious deformity   NEURO:  Mental Status: Drowsy likely 2/2 Ativan administration due to concern for seizure.  She is able to state her name, age, year, and month on assessment.  Speech is dysarthric but patient is edentulous, per family at bedside patient's speech is at baseline.  Naming and repetition are intact. There is no evidence of aphasia.  Patient intermittently follows simple commands. Poor concentration noted.  Briefly opens eyes during assessment but speaks with them closed throughout the rest of the assessment.  No neglect noted.  Cranial Nerves:  II: PERRL 3 mm/brisk.  III, IV, VI: Will briefly fixate on examiner with eye opening but quickly closes her eyes in 1-2 seconds. V: Sensation is intact to light touch and symmetrical to face. Blinks to threat. VII: Face is symmetric resting, smiling, and grimacing.   VIII: Hearing is intact to voice IX, X: Palate elevation is symmetric. Phonation normal.  XI: Head appears midline XII: Tongue protrudes midline without fasciculations.   Motor: No movement of the left upper extremity noted spontaneously or with application of stimuli. Right upper extremity has spontaneous and some antigravity movement but is unable to sustain antigravity position. Right lower extremity has more spontaneous and antigravity movement than the left lower extremity.  RLE able to sustain antigravity movement without vertical drift.  LLE with some antigravity movement but with vertical drift on assessment.  Left lower extremity with increased tone and limited ROM.  Tone is normal on the right upper and lower extremity, normal on the LUE, and increased on the LLE.  Bulk is decreased.  Sensation: Grimaces and vocalizes with light application of noxious stimuli in bilateral upper and lower extremities. She states "no" when asked if sensation to light touch is equal on upper extremities but does not provide  further information regarding sensation to light touch.  Coordination: Unable to assess, patient is unable to follow complex commands  DTRs: 2+ and symmetric patellae and biceps Plantars: Toes downgoing bilaterally Gait: Deferred  Labs I have reviewed labs in epic and the results pertinent to this consultation are: CBC    Component Value Date/Time   WBC 6.7 01/07/2021 2011   RBC 3.05 (L) 01/07/2021 2011   HGB 9.5 (L)  01/07/2021 2011   HGB 10.7 (L) 07/27/2013 0824   HCT 29.8 (L) 01/07/2021 2011   HCT 32.5 (L) 07/27/2013 0824   PLT 236 01/07/2021 2011   PLT 237 07/27/2013 0824   MCV 97.7 01/07/2021 2011   MCV 95.7 07/27/2013 0824   MCH 31.1 01/07/2021 2011   MCHC 31.9 01/07/2021 2011   RDW 14.8 01/07/2021 2011   RDW 13.4 07/27/2013 0824   LYMPHSABS 2.1 01/07/2021 2011   LYMPHSABS 2.0 07/27/2013 0824   MONOABS 0.6 01/07/2021 2011   MONOABS 0.4 07/27/2013 0824   EOSABS 0.3 01/07/2021 2011   EOSABS 0.3 07/27/2013 0824   BASOSABS 0.1 01/07/2021 2011   BASOSABS 0.1 07/27/2013 0824   CMP     Component Value Date/Time   NA 140 01/07/2021 2011   NA 144 07/27/2013 0824   K 4.0 01/07/2021 2011   K 4.3 07/27/2013 0824   CL 107 01/07/2021 2011   CL 109 (H) 07/24/2012 0929   CO2 23 01/07/2021 2011   CO2 23 07/27/2013 0824   GLUCOSE 96 01/07/2021 2011   GLUCOSE 85 07/27/2013 0824   GLUCOSE 87 07/24/2012 0929   BUN 27 (H) 01/07/2021 2011   BUN 20.3 07/27/2013 0824   CREATININE 1.03 (H) 01/07/2021 2011   CREATININE 0.9 07/27/2013 0824   CALCIUM 9.9 01/07/2021 2011   CALCIUM 9.8 07/27/2013 0824   PROT 7.7 01/07/2021 2011   PROT 7.4 07/27/2013 0824   ALBUMIN 3.8 01/07/2021 2011   ALBUMIN 3.9 07/27/2013 0824   AST 22 01/07/2021 2011   AST 18 07/27/2013 0824   ALT 12 01/07/2021 2011   ALT 17 07/27/2013 0824   ALKPHOS 77 01/07/2021 2011   ALKPHOS 62 07/27/2013 0824   BILITOT 0.9 01/07/2021 2011   BILITOT 0.25 07/27/2013 0824   GFRNONAA 57 (L) 01/07/2021 2011   GFRAA >60  11/04/2017 1611   Lipid Panel     Component Value Date/Time   CHOL 146 10/31/2013 0934   TRIG 69.0 10/31/2013 0934   HDL 54.70 10/31/2013 0934   CHOLHDL 3 10/31/2013 0934   VLDL 13.8 10/31/2013 0934   LDLCALC 78 10/31/2013 0934   LDLDIRECT 103.6 09/05/2009 1153   Lab Results  Component Value Date   HGBA1C  10/14/2009    6.1 (NOTE)                                                                       According to the ADA Clinical Practice Recommendations for 2011, when HbA1c is used as a screening test:   >=6.5%   Diagnostic of Diabetes Mellitus           (if abnormal result  is confirmed)  5.7-6.4%   Increased risk of developing Diabetes Mellitus  References:Diagnosis and Classification of Diabetes Mellitus,Diabetes BJSE,8315,17(OHYWV 1):S62-S69 and Standards of Medical Care in         Diabetes - 2011,Diabetes Care,2011,34  (Suppl 1):S11-S61.   Imaging I have reviewed the images obtained:  CT-scan of the brain 01/08/2021: Extensive white matter, and to a lesser degree cortical, edema within the posterior frontal lobes and parietal lobes (right greater than left). Subtle patchy abnormal hypodensity is also questioned within the bilateral occipital lobes. 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 However, underlying metastatic lesions are also a consideration and a contrast-enhanced brain MRI is recommended for further evaluation.   Additional cortical/subcortical hypodensity within the medial right temporal lobe/hippocampus. A superimposed 9 mm medial right temporal lobe mass is also questioned, and this may reflect edema related to a metastatic lesion. However, alternative etiologies such as encephalitis (including herpes encephalitis), seizure related edema or acute right PCA territory ischemia cannot be excluded. This too would be better characterized with a contrast-enhanced brain MRI.   No midline shift or herniation.   Paranasal  sinus disease, as described.  MRI examination of the brain with and without contrast pending  Assessment: 75 y.o. female who presented for evaluation of BL lower extremity edema, left arm edema and weakness with 2 episodes of left arm jerking with staring off and dysarthric speech while in the waiting room and one episode of left arm jerking by ED RN with resolution s/p Ativan administration. CT imaging with findings concerning for PRES versus metastatic lesions (impression as above). MRI with and without contrast and EEG pending for further evaluation. Patient was loaded with Keppra due to concern for seizures.  - Presentation is most concerning for brain metastasis due to known history of breast cancer and CTH findings. Also of concern is intermittent left arm jerking despite flaccid left arm on examination; concern for provoked seizure activity. Low suspicion for PRES with adequate blood pressure control on presentation and throughout evaluation.   Impression: CT Head with extensive white matter edema concerning for PRES versus metastatic lesions Concern for seizure with left arm involuntary jerking  Remote history of right breast cancer s/p radiation in 2013 Left arm weakness   Recommendations: - STAT EEG  - MRI brain with and without contrast - Keppra 1,500 mg load with 500 mg BID maintenance dosing for seizure prophylaxis - Inpatient seizure precautions - 2 mg Ativan for any seizure lasting > 5 minutes and notify neurology   Pt seen by NP/Neuro and later by MD. Note/plan to be edited by MD as needed.  Anibal Henderson, AGAC-NP [facility] Pager: [phone]  I have seen the patient and reviewed the above note.  She presents with new onset focal seizures and lethargy with significant brain edema on CT scan.  I agree with the differential outlined by radiology, but the edema is more subcortical than I typically see with press, and therefore I think that metastatic lesions  with edema are more likely.  MRI brain will be greatly helpful in guiding further therapy decision making process.  I think that stroke is very unlikely based on the CT.   Neurology will continue to follow.  Roland Rack, MD [facility] [phone]  If 7pm- 7am, please page neurology on call as listed in [place].

## 2021-01-08 NOTE — H&P (Signed)
In the ED Date: 01/08/2021               Patient Name:  Dorothy Li MRN: [id number]  DOB: 14-Oct-1945 Age / Sex: 75 y.o., female   PCP: [facility] [place] Service: Internal Medicine Teaching Service              Attending Physician: Dr. Velna Ochs, MD    First Contact: [name], MS4 Pager: [phone]  Second Contact: Dr. [name] Pager: [phone]            After Hours (After 5p/  First Contact Pager: [phone]  weekends / holidays): Second Contact Pager: [phone]   Chief Complaint: Left Extremities swelling  History of Present Illness: Dorothy Li is a 75 y.o. female with history of  breast cancer status post lumpectomy s/p hormone therapy, hypertension, CAD, chronic systolic CHF status post AICD, recent T12-L1 endplate fracture, osteoporosis who presented to the ED with more left extremities swelling of more than 5 days. Information in presented here was obtained from patient's family members at bedside. Per reports, patient gradually developed swelling in her left upper and lower extremities. She has associated weakness and difficulty lifting her arms. Current swelling was preceded by bilateral ankle swelling. Has chronic pain from arthritis but no particular pain associated with the swelling. Denies associated fever, changes in mentation or vision or language. He has has no nausea vomiting or diarrhea.  Family members endorsed frequent falling events, most recently in June.  She was seen in at the Urgent Care last week for the extremities swelling and was discharged home with compression stockings with follow up with her PCP. Per niece, she saw her PCP yesterday who asked her to present to the ED. Of note, her POA noted patient  has a lung nodule that she is scheduled to get a biopsy on Tuesday.  In the ED, patient has seizure-like jerking of of let upper extremity. EEG performed in the ED  is suggestive of mild to moderate diffuse encephalopathy,  nonspecific etiology. No seizures or epileptiform discharges were seen throughout the recording. She was given  Ativan, Keppra with good effect.The left are twitching has stopped.  CT head without contrast notable for extensive white matter, and to a lesser degree cortical edema within the posterior frontal lobes and parietal lobes (right greater than left). A superimposed 9 mm medial right temporal lobe mass is also questioned, and this may reflect edema related to a metastatic lesion.  Meds: Current Facility-Administered Medications  Medication Dose Route Frequency Provider Last Rate Last Admin   acetaminophen (TYLENOL) tablet 650 mg  650 mg Oral Q6H PRN [name], MD       Or   acetaminophen (TYLENOL) suppository 650 mg  650 mg Rectal Q6H PRN [name], MD       carvedilol (COREG) tablet 6.25 mg  6.25 mg Oral BID WC [name], MD   6.25 mg at 01/08/21 1706   enoxaparin (LOVENOX) injection 40 mg  40 mg Subcutaneous Q24H [name], MD   40 mg at 01/08/21 1708   levETIRAcetam (KEPPRA) tablet 500 mg  500 mg Oral BID [name], NP       sodium chloride flush (NS) 0.9 % injection 3 mL  3 mL Intravenous Q12H [name], MD       Current Outpatient Medications  Medication Sig Dispense Refill   acetaminophen (TYLENOL)  325 MG tablet Take 650 mg by mouth every 6 (six) hours as needed for moderate pain or fever.     alendronate (FOSAMAX) 70 MG tablet Take 70 mg by mouth every Monday. Take with a full glass of water on an empty stomach.     aspirin EC 81 MG tablet Take 1 tablet (81 mg total) by mouth daily.     Calcium-Vitamin D-Vitamin K 956-213-08 MG-UNT-MCG TABS Take 1 tablet by mouth daily.     carvedilol (COREG) 6.25 MG tablet Take 6.25 mg by mouth 2 (two) times daily with a meal.     Cholecalciferol (VITAMIN D3) 3000 UNITS TABS Take 3,000 Units by mouth daily.     feeding supplement (ENSURE ENLIVE / ENSURE PLUS) LIQD Take 237 mLs by mouth 3 (three) times daily  between meals. 237 mL 12   ibuprofen (ADVIL) 200 MG tablet Take 200 mg by mouth every 6 (six) hours as needed for headache or mild pain.     LACTOBACILLUS PROBIOTIC PO Take 1 capsule by mouth daily.     Multiple Vitamins-Minerals (MULTIVITAMIN WITH MINERALS) tablet Take 1 tablet by mouth daily.     pantoprazole (PROTONIX) 40 MG tablet Take 40 mg by mouth 2 (two) times daily before a meal.     sertraline (ZOLOFT) 100 MG tablet Take 100 mg by mouth daily.     traZODone (DESYREL) 50 MG tablet Take 50 mg by mouth at bedtime.  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  Patient had CABG with LIMA-LAD and SVG-OM2.    Cardiomyopathy    Echo (4/11) with EF 25%, diffuse hypokinesis, moderate LV dilation, decreased RV  systolic function but normal RV size, PA systolic pressure 35 mmHg. Lexiscan myoview (4/11): EF 29%,  diffuse hypokinesis, basal anteroseptal and apical scar, no ischemia.  The cardiomyopathy is likely due to a combination of CAD (LM stenosis) and ETOH abuse.    Carotid stenosis    carotid dopplers (6/57) with 84-69% LICA stenosis.  Carotid dopplers (62/95): 28-41%  LICA stenosis.   ETOH abuse     Elevated AST, mildly elevated INR (? component of cirrhosis). Has now quit ETOH.    HTN (hypertension)    Hyperlipemia    ICD (implantable cardiac defibrillator) in place Medtronic   Interrogated on 11/18/11   Nephrolithiasis    Rash     from tape    S/P radiation therapy 03/22/12 - 05/02/12   Right Breast: 50 gray/25Fractions with Boost of 10gray/5 Fractions   Stress fracture of right foot 01/17/12   Dr. Vangie Bicker   Syncope    none since ICD placement   Use of anastrozole (Arimidex) 05/12/12   VT (ventricular tachycardia) (HCC)    Dual chamber Medtronic ICD placed 7/11   Past Surgical History:  Procedure Laterality Date   ABDOMINAL HYSTERECTOMY     TAH-BSO, fibroids, age 17  BREAST BIOPSY  12/08/11   Right Breast, Upper Outer Quadrant: Invasive Ductal:DCIS   BREAST LUMPECTOMY  01-19-12   right lumpectomy/   CARDIAC CATHETERIZATION     COLONOSCOPY  04/11/2012   Procedure: COLONOSCOPY;  Surgeon: Jerene Bears, MD;  Location: [facility];  Service: Gastroenterology;  Laterality: N/A;   CORONARY ARTERY BYPASS GRAFT  4/11    x 2   ICD implantation  2011   ICD-Medtronic    Remote - No /  Hx of VT, LU chest   Family History  Problem Relation Age of Onset   Heart disease Sister        Had surgery   Heart failure Sister    Diabetes Mother    Breast cancer Cousin    Colon cancer Neg Hx    Social History   Socioeconomic History   Marital status: Married    Spouse name: Not on file   Number of children: Not on file   Years of education: Not on file   Highest education level: Not on file  Occupational History   Not on file  Tobacco Use   Smoking status: Former    Packs/day: 1.00    Years: 40.00    Pack years: 40.00    Types: Cigarettes    Quit date: 10/08/2009    Years since quitting: 11.2   Smokeless tobacco: Never  Substance and Sexual Activity   Alcohol use: Yes    Comment: Prior hx 2 drinks daily.  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 Married, lives in [place] smoking 4/11   Prior moderate ETOH use (2 liquor  drinks daily) but quit in 3/11.    No drug use and negative urine drug screen in 2/11   Social Determinants of Health   Financial Resource Strain: Not on file  Food Insecurity: Not on file  Transportation Needs: Not on file  Physical Activity: Not on file  Stress: Not on file  Social Connections: Not on file  Intimate Partner Violence: Not on file    Review of Systems: Pertinent items are noted in HPI.  Physical Exam: Blood pressure 129/75, pulse (!) 104, temperature 98 F (36.7 C), temperature source Oral, resp. rate (!) 23, height 5\' 8"  (1.727 m), weight 52.1 kg, SpO2 100 %.  General: drowsy and frail-appearing elderly female  laying in be in no acute distress.  Head: Normocephalic. Atraumatic. CV: RRR. No murmurs, rubs, or gallops. No LE edema Pulmonary: Lungs CTAB. Normal effort. No wheezing or rales. Abdominal: Soft, nontender, nondistended. Normal bowel sounds. Extremities: Palpable pulses. Normal ROM. Skin: Warm and dry. No obvious rash or lesions. Neuro: somnolent, oriented to self and place only. Psych: flat    Lab results:   Imaging results:    Other results: EKG: none  Assessment & Plan by Problem:  Active Problems:   Brain edema (HCC)   WENONAH MILO is a 75 y.o. female with history of  breast cancer status post lumpectomy s/p hormone therapy, hypertension, CAD, chronic systolic CHF status post AICD who presented with a left upper extremity swelling and has seizure-like activity. She is currently undergoing further investigation with imaging and medical management.    Diffuse cerebral edema complicated by seizure-like activity left lower lobe lung  nodule (2 cm, Hypermetabolic): Hilar LAD Unintentional weight loss Patient presented with a 3-day history of left upper and lower extremity weakness.  While in the ED waiting room the patient experienced seizure-like activity with subsequent CT scan finding showing extensive white matter and cortical edema  within the posterior frontal lobes and parietal lobes, right greater than left.  There is a cortical/subcortical hyperdense lesion on the medial right temporal lobe/hippocampus that was concerning for a right temporal lobe mass. Radiologist was concerned for possible PRES syndrome versus underlying metastatic lesion.  Patient has a recent past medical history from April significant for a 1.7 cm levt lower lobe cavitary lung nodule with associated left hilar lymphadenopathy.  At that time patient had a 3.6 cm right hepatic lobe lesion with questionable cirrhotic changes.  Patient had a follow-up PET scan in May with progression of the lung lesion to a 2 cm partially cavitary lesion that was hypermetabolic and concerning for primary lung neoplasm.  There is also hypermetabolic left hilar lymphadenopathy which is consistent with his past CT there is no evidence of bony metastasis at that time.  Important to note that CT scan of his head in May did not show any evidence of cerebral edema or metastatic disease.  Furthermore patient has had a roughly 3 kg weight loss in the past 2 months concerning for malignancy. Follow-up brain MRI with contrast was recommended for further evaluation but the patient is unable to have MRI performed at this time due to her ICD.  Currently we are uncertain if it is MRI compatible. Patient was given Ativan for her seizure and loaded on Keppra. Neurology has been consulted and we appreciate their recommendations. -Continue Keppra 500 mg twice daily -Seizure precautions -May require continuous EEG -Continue frequent neuro checks -Appreciate neurology's assistance with this patient. -Determin if ICD is MRI compatible -Follow-up MRI brain w/ contrast tomorrow  Left upper extremity swelling: Patient presented with left upper extremity swelling that started roughly 3 days prior.  Although I was not able to appreciate significant left upper extremity swelling on exam today.  Upper  extremity Doppler ultrasound was ordered in the ED and is pending.  We will wait for the result to rule out possible upper extremity DVT.  We will start on DVT prophylaxis at this time. -f/u Upper extremity US - DVT ppx with enoxaparin   #CAD #Carotid stenosis Patient has a history of nonobstructive CAD on ASA 81 mg and carvedilol 6.25 mg twice daily.  Looking at the patient's previous medications she was previously on atorvastatin 80 mg but was stopped at some point.  We will need further evaluation for statin therapy moving forward. -Continue ASA 81 mg -Continue carvedilol 6.25 mg twice daily -Consider adding atorvastatin  #Mixed CHF s/p ICD: Patient has a history of mixed CHF with her last echo on 10/2020 showing an EF of 30 to 35% with global hypokinesia.  She had grade 1 diastolic dysfunction with mildly enlarged right ventricle.  Her BNP was mildly elevated in the 300s today without significant evidence of hypervolemia on exam.  Patient was given 20 mg of IV furosemide in the ED and appears euvolemic.  She is status post ICD placement as not currently on diuretic therapy at home.  She was previously on spironolactone but this has apparently been discontinued within the last 6 months.  -We will hold off on further diuretics at this time. -Strict ins and outs -Daily weights  This is a Careers information officer Note.  The  care of the patient was discussed with Dr. [name] and the assessment and plan was formulated with their assistance.  Please see their note for official documentation of the patient encounter.   Signed: Ross Ludwig, Medical Student 01/08/2021, 5:36 PM   Attestation for Student Documentation:  I personally was present and performed or re-performed the history, physical exam and medical decision-making activities of this service and have verified that the service and findings are accurately documented in the student's note.  Lawerance Cruel, D.O.  Internal Medicine  Resident, PGY-3 [facility]  Pager: [phone] 7:29 PM, 01/08/2021

## 2021-01-08 NOTE — Progress Notes (Signed)
EEG complete - results pending 

## 2021-01-08 NOTE — Hospital Course (Addendum)
Dorothy Li is a 75 y.o female with a history CAD,HTN, carotid stenosis, remote history of breast cancer s/p lumpectomy and anastrozole therapy, and recently discovered enlarging lung lesion concerning for primary lung  cancer who was admitted for multifocal metastatic brain disease after presenting with partial simple seizures and left sided hemiparesis.  Her Hospital course by problems is as follows:  Metastatic disease to the brain, complicated by seizures and vasogenic edema  Presented with week long Simple Partial Seizures and left sided weakness. Patient initially presented to the ED with chief complaint of left upper extremity swelling of 4 days duration. On review of system, she reported body twitching that lasts seconds to a few minutes but didn't  didn't know those were seizures. Never loss consciousness during the episodes but have had occasional urinary incontinence. Patient visited an urgent care a week prior for similar complaint of swelling in her left upper extremities and was given a compression stocking.Physical exam on presentation to the ED was unremarkable for swelling but did show hemiparesis. Patient later had a witnessed seizure-like activity in the ED. STAT EEG did not capture seizure or epileptiform activity but suggestive of mild to moderate diffuse encephalopathy, nonspecific etiology. She was given ativan and a load of keppra. 7/7 CT imaging of the head showed diffuse edema in posterior frontal lobe and possible mass in the temporal lobe. Follow up  MRI finding showed diffuse vasogenic edema and up to ten diffuse enhancing lesion with the largest one being a right posterior frontal lobe lesion. The findings were consistent with metastatic disease. Important to note that CT scan of her head in May did not show any evidence of cerebral edema or metastatic disease. The primary cancer is likely bronchogenic malignancy given April CT findings of cavitary lung lesions that had interval  increase in size, prominent hilar lymphadenopathies as well as PET scan showing hypermetabolic activity in locations similar to CT findings.   Was seen by neurosurgery, Med-onc as well as rad-on. Patient evaluated by medical oncology who deferred palliative chemotherapy given other significant multiple comorbidities and appears cachexia and  do not believe she could recover to the point of getting strong enough to tolerate palliative chemotherapy It was determined that the constellation of symptoms and focal neurologic deficit were direct and indirect effects of the brain mets and its associated vasogenic edema.   Patient had significant interval improvement in mentation and as well as appetite since initiation of systemic steroid on 7/8   On 7/9 there was a family meeting consisted of niece who is her healthcare power of attorney, her husband, daughter and multiple other  nieces. Patient and her family have decided to pursue hospice care at home.

## 2021-01-09 ENCOUNTER — Encounter (HOSPITAL_COMMUNITY): Payer: Self-pay | Admitting: Internal Medicine

## 2021-01-09 ENCOUNTER — Inpatient Hospital Stay (HOSPITAL_COMMUNITY): Payer: No Typology Code available for payment source

## 2021-01-09 ENCOUNTER — Ambulatory Visit
Admission: RE | Admit: 2021-01-09 | Discharge: 2021-01-09 | Disposition: A | Discharge status: Home / Self Care | Payer: No Typology Code available for payment source | Source: Ambulatory Visit | Attending: Radiation Oncology | Admitting: Radiation Oncology

## 2021-01-09 DIAGNOSIS — I4891 Unspecified atrial fibrillation: Secondary | ICD-10-CM

## 2021-01-09 DIAGNOSIS — C3490 Malignant neoplasm of unspecified part of unspecified bronchus or lung: Secondary | ICD-10-CM

## 2021-01-09 DIAGNOSIS — G8194 Hemiplegia, unspecified affecting left nondominant side: Secondary | ICD-10-CM

## 2021-01-09 DIAGNOSIS — G936 Cerebral edema: Secondary | ICD-10-CM

## 2021-01-09 DIAGNOSIS — C7931 Secondary malignant neoplasm of brain: Principal | ICD-10-CM

## 2021-01-09 DIAGNOSIS — Z87891 Personal history of nicotine dependence: Secondary | ICD-10-CM

## 2021-01-09 DIAGNOSIS — R569 Unspecified convulsions: Secondary | ICD-10-CM

## 2021-01-09 DIAGNOSIS — Z853 Personal history of malignant neoplasm of breast: Secondary | ICD-10-CM

## 2021-01-09 LAB — CBC
HCT: 29.7 % — ABNORMAL LOW (ref 36.0–46.0)
Hemoglobin: 9.7 g/dL — ABNORMAL LOW (ref 12.0–15.0)
MCH: 30.4 pg (ref 26.0–34.0)
MCHC: 32.7 g/dL (ref 30.0–36.0)
MCV: 93.1 fL (ref 80.0–100.0)
Platelets: 226 10*3/uL (ref 150–400)
RBC: 3.19 MIL/uL — ABNORMAL LOW (ref 3.87–5.11)
RDW: 14.4 % (ref 11.5–15.5)
WBC: 5.9 10*3/uL (ref 4.0–10.5)
nRBC: 0 % (ref 0.0–0.2)

## 2021-01-09 LAB — GLUCOSE, CAPILLARY
Glucose-Capillary: 100 mg/dL — ABNORMAL HIGH (ref 70–99)
Glucose-Capillary: 193 mg/dL — ABNORMAL HIGH (ref 70–99)

## 2021-01-09 LAB — COMPREHENSIVE METABOLIC PANEL
ALT: 12 U/L (ref 0–44)
AST: 21 U/L (ref 15–41)
Albumin: 3.2 g/dL — ABNORMAL LOW (ref 3.5–5.0)
Alkaline Phosphatase: 75 U/L (ref 38–126)
Anion gap: 8 (ref 5–15)
BUN: 18 mg/dL (ref 8–23)
CO2: 23 mmol/L (ref 22–32)
Calcium: 9.5 mg/dL (ref 8.9–10.3)
Chloride: 105 mmol/L (ref 98–111)
Creatinine, Ser: 0.81 mg/dL (ref 0.44–1.00)
GFR, Estimated: >60 mL/min (ref >60)
Glucose, Bld: 91 mg/dL (ref 70–99)
Potassium: 3.4 mmol/L — ABNORMAL LOW (ref 3.5–5.1)
Sodium: 136 mmol/L (ref 135–145)
Total Bilirubin: 0.8 mg/dL (ref 0.3–1.2)
Total Protein: 6.5 g/dL (ref 6.5–8.1)

## 2021-01-09 MED ORDER — INSULIN ASPART 100 UNIT/ML IJ SOLN
0.0000 [IU] | Freq: Three times a day (TID) | INTRAMUSCULAR | Status: DC
  Administered 2021-01-10: 5 [IU] via SUBCUTANEOUS
  Administered 2021-01-10: 2 [IU] via SUBCUTANEOUS

## 2021-01-09 MED ORDER — DEXAMETHASONE SODIUM PHOSPHATE 10 MG/ML IJ SOLN
10.0000 mg | Freq: Once | INTRAMUSCULAR | Status: AC
  Administered 2021-01-09: 10 mg via INTRAVENOUS
  Filled 2021-01-09 (×2): qty 1

## 2021-01-09 MED ORDER — LEVETIRACETAM 750 MG PO TABS
750.0000 mg | ORAL_TABLET | Freq: Two times a day (BID) | ORAL | Status: DC
  Administered 2021-01-09 – 2021-01-11 (×4): 750 mg via ORAL
  Filled 2021-01-09 (×4): qty 1

## 2021-01-09 MED ORDER — GADOBUTROL 1 MMOL/ML IV SOLN
5.2000 mL | Freq: Once | INTRAVENOUS | Status: AC | PRN
  Administered 2021-01-09: 5.2 mL via INTRAVENOUS

## 2021-01-09 MED ORDER — DEXAMETHASONE 4 MG PO TABS
4.0000 mg | ORAL_TABLET | Freq: Four times a day (QID) | ORAL | Status: DC
  Administered 2021-01-09 – 2021-01-11 (×7): 4 mg via ORAL
  Filled 2021-01-09 (×7): qty 1

## 2021-01-09 MED ORDER — INSULIN ASPART 100 UNIT/ML IJ SOLN: 0.0000 [IU] | Freq: Every day | INTRAMUSCULAR | Status: DC

## 2021-01-09 MED ORDER — DEXAMETHASONE 4 MG PO TABS: 4.0000 mg | ORAL_TABLET | Freq: Two times a day (BID) | ORAL | Status: DC

## 2021-01-09 NOTE — Progress Notes (Signed)
Discussed image findings with patient and her husband. Patients niece was also notified and we discussed findings. Patients niece is her healthcare power of attorney and will be available tomorrow at 10 am for goals of care discussion. We will consult oncology and pulmonology pending discussion with family tomorrow.

## 2021-01-09 NOTE — Consult Note (Addendum)
Dorothy Li  [phone] Fax:([phone] Dorothy Li  Referral MD: Dr. Ronnald Ramp (neurosurgery) and Mont Dutton (CNS navigator)  Reason for Referral: Left lower lobe lung lesion, multiple brain lesions  HPI: She is a poor historian.  Dorothy Li is a 75 year old female with a past medical history significant for breast cancer status postlumpectomy and status post hormone therapy, hypertension, CAD, chronic systolic CHF status post AICD, recent T12-L1 endplate fracture, osteoporosis.  She was last seen by Dr. Lindi Adie several years ago and subsequently transferred her care to the [place].  She could not recall whether she is still taking adjuvant endocrine therapy and when was her last oncology appointment.  The patient presented to the emergency department with a 5-day history of lower extremity swelling.  Family also reported frequent falling.  In the emergency department, she had seizure-like activity and EEG suggestive of mild to moderate diffuse encephalopathy with nonspecific etiology.  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  She had an MRI of the brain performed with and without contrast which showed at least 10 enhancing parenchymal lesions compatible with intracranial metastatic disease with vasogenic edema.  There is also subtle leptomeningeal disease questioned along the right frontoparietal lobes.  Of note, the patient had a PET scan performed on 11/14/2020 which showed a 2 cm partially cavitary left lower lobe lung lesion which was hypermetabolic and consistent with primary lung neoplasm, hypermetabolic left hilar lymphadenopathy, no findings to suggest metastatic disease in the  abdomen/pelvis.  According to the patient's chart, these results were given to her POA and the patient was following up with the [facility] in [place].  I have tried to review records from the [place] through care everywhere.  I am having difficulty seeing progress notes and did not see any recent labs or pathology results.  Subsequently, I spoke with her niece, Dorothy Li.  She told me the pulmonologist at the [facility] has arranged for biopsy next Tuesday.  The patient admits that she was a heavy smoker but quit more than 20 years ago.  Of note, she had multiple CT head done in April and May which did not show any abnormal brain lesions.  The most recent CT head done on July 7 show significant changes.  The patient's husband is at the bedside today and helps with the patient's history.  The patient reports that she cannot recall a follow-up visit with the [place].  However, she also tells me that she was due to have a biopsy next Thursday.  The patient reports left arm and leg "swelling."  She thinks that her swelling has improved. She reports that her appetite has been good but has lost weight.  She cannot tell me how much weight she has lost.  She states that she is not having any headaches, but note from neurology indicates that she has had frontal headaches.  She denies double vision.  Reports intermittent nonproductive cough.  Denies chest pain, shortness of breath, hemoptysis.  She denies abdominal pain, nausea, vomiting, constipation, diarrhea.  No bleeding reported. The patient's husband tells me that she has been more forgetful lately.  In fact, he tells me about an episode where she was driving home from the [place] about a month ago  and ended up going to [place] instead of driving home.  He received a call from a sheriff in [place] notifying him that his wife was there with them.  The patient seems to recall this incident, but states that she drove to [place] and not to [place].  Medical oncology was asked see the  patient for recommendations regarding her lung mass and brain lesions.  The patient herself does not cook.  Her niece would come in and bring food.  Her husband is legally blind.  Past Medical History:  Diagnosis Date   ARF (acute renal failure) ([place])    in setting dehydration with ACE I use    Arthritis    Breast cancer ([place]) 12/08/11   ER/PR +, breast 10 o'clock dx=invasive Ductal Ca,,dcis   CAD (coronary artery disease)    LHC (4/11) showed 75% ostial LM stenosis with significant damping upon catheter engagement, 60-70% mLAD, 60% ostial D1, EF 30%.  Patient had CABG with LIMA-LAD and SVG-OM2.    Cardiomyopathy    Echo (4/11) with EF 25%, diffuse hypokinesis, moderate LV dilation, decreased RV  systolic function but normal RV size, PA systolic pressure 35 mmHg. Lexiscan myoview (4/11): EF 29%,  diffuse hypokinesis, basal anteroseptal and apical scar, no ischemia.  The cardiomyopathy is likely due to a combination of CAD (LM stenosis) and ETOH abuse.    Carotid stenosis    carotid dopplers (6/28) with 36-62% LICA stenosis.  Carotid dopplers (94/76): 54-65%  LICA stenosis.   ETOH abuse     Elevated AST, mildly elevated INR (? component of cirrhosis). Has now quit ETOH.    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 Vangie Bicker   Syncope    none since ICD placement   Use of anastrozole (Arimidex) 05/12/12   VT (ventricular tachycardia) ([place])    Dual chamber Medtronic ICD placed 7/11  :   Past Surgical History:  Procedure Laterality Date   ABDOMINAL HYSTERECTOMY     TAH-BSO, fibroids, age 89   BREAST BIOPSY  12/08/11   Right Breast, Upper Outer Quadrant: Invasive Ductal:DCIS   BREAST LUMPECTOMY  01-19-12   right  lumpectomy/   CARDIAC CATHETERIZATION     COLONOSCOPY  04/11/2012   Procedure: COLONOSCOPY;  Surgeon: Jerene Bears, MD;  Location: [facility];  Service: Gastroenterology;  Laterality: N/A;   CORONARY ARTERY BYPASS GRAFT  4/11    x 2   ICD implantation  2011   ICD-Medtronic    Remote - No /  Hx of VT, LU chest  :   Current Facility-Administered Medications  Medication Dose Route Frequency Provider Last Rate Last Admin   acetaminophen (TYLENOL) tablet 650 mg  650 mg Oral Q6H PRN [name], MD       Or   acetaminophen (TYLENOL) suppository 650 mg  650 mg Rectal Q6H PRN [name], MD       aspirin EC tablet 81 mg  81 mg Oral Daily [name], MD   81 mg at 01/09/21 1300   carvedilol (COREG) tablet 6.25 mg  6.25 mg Oral BID WC [name], MD   6.25 mg at 01/08/21 1706   dexamethasone (  DECADRON) injection 10 mg  10 mg Intravenous Once Greta Doom, MD       dexamethasone (DECADRON) tablet 4 mg  4 mg Oral Q6H Meyran, Ocie Cornfield, NP       enoxaparin (LOVENOX) injection 40 mg  40 mg Subcutaneous Q24H [name], MD   40 mg at 01/08/21 1708   insulin aspart (novoLOG) injection 0-15 Units  0-15 Units Subcutaneous TID WC Madalyn Rob, MD       insulin aspart (novoLOG) injection 0-5 Units  0-5 Units Subcutaneous QHS Madalyn Rob, MD       levETIRAcetam (KEPPRA) tablet 750 mg  750 mg Oral BID Greta Doom, MD       sodium chloride flush (NS) 0.9 % injection 3 mL  3 mL Intravenous Q12H [name], MD   3 mL at 01/08/21 2246      Allergies  Allergen Reactions   Penicillins Other (See Comments)    "blacks out" per pt    Latex Hives and Other (See Comments)    Gloves   Tape Itching and Rash    Bandage on breast from bx caused rash  :   Family History  Problem Relation Age of Onset   Heart disease Sister        Had surgery   Heart failure Sister    Diabetes Mother    Breast cancer Cousin    Colon cancer Neg Hx   :   Social History    Socioeconomic History   Marital status: Married    Spouse name: Not on file   Number of children: 0   Years of education: Not on file   Highest education level: Not on file  Occupational History   Not on file  Tobacco Use   Smoking status: Former    Packs/day: 1.00    Years: 40.00    Pack years: 40.00    Types: Cigarettes    Quit date: 10/08/2009    Years since quitting: 11.2   Smokeless tobacco: Never  Substance and Sexual Activity   Alcohol use: Yes    Comment: Prior hx 2 drinks daily.  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 Married, lives in [place]. Quit smoking 4/11. Prior moderate ETOH use (2 liquor drinks daily) but quit in 3/11. No drug use and negative urine drug screen in 2/11   Social Determinants of Health   Financial Resource Strain: Not on file  Food Insecurity: Not on file  Transportation Needs: Not on file  Physical Activity: Not on file  Stress: Not on file  Social Connections: Not on file  Intimate Partner Violence: Not on file  :  Review of Systems: A comprehensive 14 point review of systems was negative except as noted in the HPI.  Exam: Patient Vitals for the past 24 hrs:  BP Temp Temp src Pulse Resp SpO2  01/09/21 0810 136/70 98.5 F (36.9 C) Oral 92 15 100 %  01/09/21 0400 125/71 98.3 F (36.8 C) Oral 99 15 96 %  01/09/21 0000 -- -- -- 90 17 98 %  01/08/21 2331 139/72 98.5 F (36.9 C) Oral 95 18 96 %  01/08/21 2000 132/77 99 F (37.2 C) Oral (!) 103 16 99 %  01/08/21 1830 124/74 -- -- [name]  109 19 97 %  01/08/21 1815 113/65 -- -- (!) 106 19 97 %  01/08/21 1800 138/81 -- -- (!) 114 18 97 %  01/08/21 1719 -- 98 F (36.7 C) Oral -- -- --  01/08/21 1715 129/75 -- -- (!) 104 (!) 23 100 %  01/08/21 1706 (!) 147/86 -- -- (!) 104 -- --  01/08/21 1630 (!)  140/110 -- -- (!) 105 20 100 %  01/08/21 1600 (!) 142/77 -- -- (!) 103 (!) 33 98 %  01/08/21 1545 135/73 -- -- (!) 108 (!) 26 97 %    General: Chronically ill-appearing female, resting comfortably in bed Eyes:  no scleral icterus.   ENT:  There were no oropharyngeal lesions.   Lymphatics:  Negative cervical, supraclavicular or axillary adenopathy.   Respiratory: lungs were clear bilaterally without wheezing or crackles.   Cardiovascular:  Regular rate and rhythm, S1/S2, without murmur, rub or gallop.  There was no pedal edema.   GI:  abdomen was soft, flat, nontender, nondistended, without organomegaly.   Musculoskeletal:  no spinal tenderness of palpation of vertebral spine.   Skin exam was without echymosis, petichae.   Neuro: She has a left-sided hemiparesis, alert and oriented x3, forgets details at times. I performed breast examination.  Noted poor personal hygiene.  Noted lumpectomy scar with no other abnormalities  Lab Results  Component Value Date   WBC 5.9 01/09/2021   HGB 9.7 (L) 01/09/2021   HCT 29.7 (L) 01/09/2021   PLT 226 01/09/2021   GLUCOSE 91 01/09/2021   CHOL 146 10/31/2013   TRIG 69.0 10/31/2013   HDL 54.70 10/31/2013   LDLDIRECT 103.6 09/05/2009   LDLCALC 78 10/31/2013   ALT 12 01/09/2021   AST 21 01/09/2021   NA 136 01/09/2021   K 3.4 (L) 01/09/2021   CL 105 01/09/2021   CREATININE 0.81 01/09/2021   BUN 18 01/09/2021   CO2 23 01/09/2021   I have personally reviewed her imaging studies  DG Ribs Unilateral W/Chest Right  Result Date: 12/21/2020 CLINICAL DATA:  Fall.  Right rib pain EXAM: RIGHT RIBS AND CHEST - 3+ VIEW COMPARISON:  11/11/2020 FINDINGS: Prior CABG. AICD in satisfactory position. Cardiac enlargement. Negative for heart failure. Lungs are clear without infiltrate or effusion Right rib detail is suboptimal. Question fracture lower lateral rib on the right. IMPRESSION: Question right lower rib fracture laterally. Suboptimal rib technique No  acute cardiopulmonary abnormality. Electronically Signed   By: Franchot Gallo M.D.   On: 12/21/2020 19:21   CT Head Wo Contrast  Result Date: 01/08/2021 CLINICAL DATA:  Neuro deficit, acute, stroke suspected. Left arm weakness for 4 days. Altered mental status. EXAM: CT HEAD WITHOUT CONTRAST TECHNIQUE: Contiguous axial images were obtained from the base of the skull through the vertex without intravenous contrast. COMPARISON:  Prior head CT 11/11/2020. FINDINGS: Brain: Cerebral volume is normal for age. There is extensive white matter, and to a lesser degree cortical, edema within the posterior frontal lobes and parietal lobes (right greater than left). Subtle patchy abnormal hypodensity is also questioned within the bilateral occipital lobes. Additional cortical/subcortical hypodensity within the medial right temporal lobe/hippocampus. Suggestion of a 9 mm superimposed medial right temporal lobe mass (series 4, image 14). No evidence of acute intracranial hemorrhage. No extra-axial fluid collection. No midline shift. Vascular: No hyperdense vessel.  Atherosclerotic calcifications. Skull: Normal. Negative for fracture or focal lesion. Sinuses/Orbits: Visualized orbits show no acute finding. 2.1 cm right maxillary sinus mucous retention cyst. Mild left maxillary  sinus mucosal thickening. Trace right frontal and bilateral ethmoid sinus mucosal thickening. These results were called by telephone at the time of interpretation on 01/08/2021 at 9:48 am to provider [facility] , who verbally acknowledged these results. IMPRESSION: Extensive white matter, and to a lesser degree cortical, edema within the posterior frontal lobes and parietal lobes (right greater than left). Subtle patchy abnormal hypodensity is also questioned within the bilateral occipital lobes. 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 However, underlying metastatic lesions are also a consideration and a  contrast-enhanced brain MRI is recommended for further evaluation. Additional cortical/subcortical hypodensity within the medial right temporal lobe/hippocampus. A superimposed 9 mm medial right temporal lobe mass is also questioned, and this may reflect edema related to a metastatic lesion. However, alternative etiologies such as encephalitis (including herpes encephalitis), seizure related edema or acute right PCA territory ischemia cannot be excluded. This too would be better characterized with a contrast-enhanced brain MRI. No midline shift or herniation. Paranasal sinus disease, as described. Electronically Signed   By: Kellie Simmering DO   On: 01/08/2021 09:48   MR BRAIN W WO CONTRAST  Result Date: 01/09/2021 CLINICAL DATA:  Seizure, abnormal neuro exam; neuro deficit, acute, stroke suspected; brain mass or lesion. EXAM: MRI HEAD WITHOUT AND WITH CONTRAST TECHNIQUE: Multiplanar, multiecho pulse sequences of the brain and surrounding structures were obtained without and with intravenous contrast. CONTRAST:  5.51m GADAVIST GADOBUTROL 1 MMOL/ML IV SOLN COMPARISON:  Prior head CT examinations 01/08/2021 and earlier. PET-CT 11/14/2020. FINDINGS: Brain: Multiple sequences are significantly motion degraded, limiting evaluation. Most notably, there is severe motion degradation of the axial precontrast T1 weighted sequence, severe motion degradation of the coronal T2 weighted sequence oriented perpendicular to the long axis of the hippocampi, severe motion degradation of the T2/FLAIR sequence oriented perpendicular to the long axis of the hippocampi, severe motion degradation of the whole brain coronal T2 weighted sequence, severe motion degradation of the axial T1 weighted postcontrast sequence and severe motion degradation of the coronal T1 weighted postcontrast sequence. There are multiple enhancing parenchymal lesions with surrounding vasogenic edema, as follows. Findings are compatible with intracranial metastatic  disease. 2.7 x 1.9 cm lesion along the posterior right frontal lobe, involving the motor strip (series 16, image 41). Prominent surrounding vasogenic edema. Subtle adjacent enhancing leptomeningeal metastatic disease is also questioned along the right frontoparietal lobes (for instance as seen on series 19, images 10-12). 7 mm enhancing lesion within the mid to posterior left frontal lobe (series 19, image 19). 5 mm enhancing lesion within the mid left frontal lobe (series 19, image 17). 7 mm enhancing lesion within the mid to posterior left frontal lobe (series 19, image 18). 11 mm enhancing lesion within the left parietal lobe with moderate to prominent surrounding vasogenic edema (series 19, image 16). 4 mm enhancing lesion within the right parietal lobe (series 19, image 8). 4 mm enhancing lesion within the right temporoparietal junction (series 19, image 9). 12 mm lesion within the right hippocampus/crus of fornix (series 19, image 10). Mild to moderate surrounding edema. 3 mm lesion within the right occipital lobe (series 19, image 8). Possible additional subcentimeter enhancing lesion with mild surrounding edema in the medial right occipital lobe (versus small chronic cortically based infarct) (series 8, image 15) (series 7, image 15) (series 19, image 11). 3 mm lesion within the inferior left parietal lobe (series 19, image 15). There is no acute infarct. No chronic intracranial blood products. No extra-axial fluid collection. No midline  shift. Vascular: Expected proximal arterial flow voids. Skull and upper cervical spine: No focal suspicious marrow lesion. Sinuses/Orbits: Visualized orbits show no acute finding. 13 mm right maxillary sinus mucous retention cyst. IMPRESSION: Significantly motion degraded examination, as described and limiting evaluation. This includes severe motion degradation of multiple post-contrast sequences. As a result, small metastatic lesions may be obscured and a repeat examination  should be considered when the patient is better able to tolerate the study. At least 10 enhancing parenchymal lesions are identified, compatible with intracranial metastatic disease. The largest lesions are located within the posterior right frontal lobe/motor strip (2.7 x 1.9 cm, with prominent surrounding vasogenic edema), left parietal lobe (11 mm, with moderate to prominent surrounding vasogenic edema) and within the right hippocampus/crus of fornix (12 mm, with mild-to-moderate surrounding vasogenic edema). Regional mass effect associated with these dominant lesions without midline shift. Additionally, subtle leptomeningeal metastatic disease is questioned along the right frontoparietal lobes. Electronically Signed   By: Kellie Simmering DO   On: 01/09/2021 13:13   EEG adult  Result Date: 01/08/2021 Lora Havens, MD     01/08/2021 12:40 PM Patient Name: ONYINYECHI HUANTE MRN: [id number] Epilepsy Attending: Lora Havens Referring Physician/Provider: Anibal Henderson, NP Date: 01/08/2021 Duration: 23.48 mins Patient history: 75 year old female with history of right breast cancer status posttreatment who was noted to have left arm weakness and twitching.  EEG to evaluate for seizures. Level of alertness: Awake, asleep AEDs during EEG study: Ativan, Keppra Technical aspects: This EEG study was done with scalp electrodes positioned according to the 10-20 International system of electrode placement. Electrical activity was acquired at a sampling rate of _0  and reviewed with a high frequency filter of _1  and a low frequency filter of _2 . EEG data were recorded continuously and digitally stored. Description: The posterior dominant rhythm consists of 8-9 Hz activity of moderate voltage (25-35 uV) seen predominantly in posterior head regions, symmetric and reactive to eye opening and eye closing. Sleep was characterized by vertex waves, sleep spindles (12 to 14 Hz), maximal frontocentral region. EEG showed continuous  generalized 5 to 6 Hz theta-delta slowing with overriding 15 to 18 Hz beta activity distributed symmetrically and diffusely. Hyperventilation and photic stimulation were not performed.   ABNORMALITY - Continuous slow, generalized - Excessive beta, generalized IMPRESSION: This study is suggestive of mild to moderate diffuse encephalopathy, nonspecific etiology. No seizures or epileptiform discharges were seen throughout the recording. Lora Havens     DG Ribs Unilateral W/Chest Right  Result Date: 12/21/2020 CLINICAL DATA:  Fall.  Right rib pain EXAM: RIGHT RIBS AND CHEST - 3+ VIEW COMPARISON:  11/11/2020 FINDINGS: Prior CABG. AICD in satisfactory position. Cardiac enlargement. Negative for heart failure. Lungs are clear without infiltrate or effusion Right rib detail is suboptimal. Question fracture lower lateral rib on the right. IMPRESSION: Question right lower rib fracture laterally. Suboptimal rib technique No acute cardiopulmonary abnormality. Electronically Signed   By: Franchot Gallo M.D.   On: 12/21/2020 19:21   CT Head Wo Contrast  Result Date: 01/08/2021 CLINICAL DATA:  Neuro deficit, acute, stroke suspected. Left arm weakness for 4 days. Altered mental status. EXAM: CT HEAD WITHOUT CONTRAST TECHNIQUE: Contiguous axial images were obtained from the base of the skull through the vertex without intravenous contrast. COMPARISON:  Prior head CT 11/11/2020. FINDINGS: Brain: Cerebral volume is normal for age. There is extensive white matter, and to a lesser degree cortical, edema within the posterior frontal lobes and parietal lobes (right greater  than left). Subtle patchy abnormal hypodensity is also questioned within the bilateral occipital lobes. Additional cortical/subcortical hypodensity within the medial right temporal lobe/hippocampus. Suggestion of a 9 mm superimposed medial right temporal lobe mass (series 4, image 14). No evidence of acute intracranial hemorrhage. No extra-axial fluid  collection. No midline shift. Vascular: No hyperdense vessel.  Atherosclerotic calcifications. Skull: Normal. Negative for fracture or focal lesion. Sinuses/Orbits: Visualized orbits show no acute finding. 2.1 cm right maxillary sinus mucous retention cyst. Mild left maxillary sinus mucosal thickening. Trace right frontal and bilateral ethmoid sinus mucosal thickening. These results were called by telephone at the time of interpretation on 01/08/2021 at 9:48 am to provider [facility] , who verbally acknowledged these results. IMPRESSION: Extensive white matter, and to a lesser degree cortical, edema within the posterior frontal lobes and parietal lobes (right greater than left). Subtle patchy abnormal hypodensity is also questioned within the bilateral occipital lobes. 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 However, underlying metastatic lesions are also a consideration and a contrast-enhanced brain MRI is recommended for further evaluation. Additional cortical/subcortical hypodensity within the medial right temporal lobe/hippocampus. A superimposed 9 mm medial right temporal lobe mass is also questioned, and this may reflect edema related to a metastatic lesion. However, alternative etiologies such as encephalitis (including herpes encephalitis), seizure related edema or acute right PCA territory ischemia cannot be excluded. This too would be better characterized with a contrast-enhanced brain MRI. No midline shift or herniation. Paranasal sinus disease, as described. Electronically Signed   By: Kellie Simmering DO   On: 01/08/2021 09:48   MR BRAIN W WO CONTRAST  Result Date: 01/09/2021 CLINICAL DATA:  Seizure, abnormal neuro exam; neuro deficit, acute, stroke suspected; brain mass or lesion. EXAM: MRI HEAD WITHOUT AND WITH CONTRAST TECHNIQUE: Multiplanar, multiecho pulse sequences of the brain and surrounding structures were obtained without and with intravenous contrast.  CONTRAST:  5.36m GADAVIST GADOBUTROL 1 MMOL/ML IV SOLN COMPARISON:  Prior head CT examinations 01/08/2021 and earlier. PET-CT 11/14/2020. FINDINGS: Brain: Multiple sequences are significantly motion degraded, limiting evaluation. Most notably, there is severe motion degradation of the axial precontrast T1 weighted sequence, severe motion degradation of the coronal T2 weighted sequence oriented perpendicular to the long axis of the hippocampi, severe motion degradation of the T2/FLAIR sequence oriented perpendicular to the long axis of the hippocampi, severe motion degradation of the whole brain coronal T2 weighted sequence, severe motion degradation of the axial T1 weighted postcontrast sequence and severe motion degradation of the coronal T1 weighted postcontrast sequence. There are multiple enhancing parenchymal lesions with surrounding vasogenic edema, as follows. Findings are compatible with intracranial metastatic disease. 2.7 x 1.9 cm lesion along the posterior right frontal lobe, involving the motor strip (series 16, image 41). Prominent surrounding vasogenic edema. Subtle adjacent enhancing leptomeningeal metastatic disease is also questioned along the right frontoparietal lobes (for instance as seen on series 19, images 10-12). 7 mm enhancing lesion within the mid to posterior left frontal lobe (series 19, image 19). 5 mm enhancing lesion within the mid left frontal lobe (series 19, image 17). 7 mm enhancing lesion within the mid to posterior left frontal lobe (series 19, image 18). 11 mm enhancing lesion within the left parietal lobe with moderate to prominent surrounding vasogenic edema (series 19, image 16). 4 mm enhancing lesion within the right parietal lobe (series 19, image 8). 4 mm enhancing lesion within the right temporoparietal junction (series 19, image 9). 12 mm lesion within the right hippocampus/crus of  fornix (series 19, image 10). Mild to moderate surrounding edema. 3 mm lesion within the  right occipital lobe (series 19, image 8). Possible additional subcentimeter enhancing lesion with mild surrounding edema in the medial right occipital lobe (versus small chronic cortically based infarct) (series 8, image 15) (series 7, image 15) (series 19, image 11). 3 mm lesion within the inferior left parietal lobe (series 19, image 15). There is no acute infarct. No chronic intracranial blood products. No extra-axial fluid collection. No midline shift. Vascular: Expected proximal arterial flow voids. Skull and upper cervical spine: No focal suspicious marrow lesion. Sinuses/Orbits: Visualized orbits show no acute finding. 13 mm right maxillary sinus mucous retention cyst. IMPRESSION: Significantly motion degraded examination, as described and limiting evaluation. This includes severe motion degradation of multiple post-contrast sequences. As a result, small metastatic lesions may be obscured and a repeat examination should be considered when the patient is better able to tolerate the study. At least 10 enhancing parenchymal lesions are identified, compatible with intracranial metastatic disease. The largest lesions are located within the posterior right frontal lobe/motor strip (2.7 x 1.9 cm, with prominent surrounding vasogenic edema), left parietal lobe (11 mm, with moderate to prominent surrounding vasogenic edema) and within the right hippocampus/crus of fornix (12 mm, with mild-to-moderate surrounding vasogenic edema). Regional mass effect associated with these dominant lesions without midline shift. Additionally, subtle leptomeningeal metastatic disease is questioned along the right frontoparietal lobes. Electronically Signed   By: Kellie Simmering DO   On: 01/09/2021 13:13   EEG adult  Result Date: 01/08/2021 Lora Havens, MD     01/08/2021 12:40 PM Patient Name: JEMA DEEGAN MRN: [id number] Epilepsy Attending: Lora Havens Referring Physician/Provider: Anibal Henderson, NP Date: 01/08/2021 Duration:  23.48 mins Patient history: 75 year old female with history of right breast cancer status posttreatment who was noted to have left arm weakness and twitching.  EEG to evaluate for seizures. Level of alertness: Awake, asleep AEDs during EEG study: Ativan, Keppra Technical aspects: This EEG study was done with scalp electrodes positioned according to the 10-20 International system of electrode placement. Electrical activity was acquired at a sampling rate of _0  and reviewed with a high frequency filter of _1  and a low frequency filter of _2 . EEG data were recorded continuously and digitally stored. Description: The posterior dominant rhythm consists of 8-9 Hz activity of moderate voltage (25-35 uV) seen predominantly in posterior head regions, symmetric and reactive to eye opening and eye closing. Sleep was characterized by vertex waves, sleep spindles (12 to 14 Hz), maximal frontocentral region. EEG showed continuous generalized 5 to 6 Hz theta-delta slowing with overriding 15 to 18 Hz beta activity distributed symmetrically and diffusely. Hyperventilation and photic stimulation were not performed.   ABNORMALITY - Continuous slow, generalized - Excessive beta, generalized IMPRESSION: This study is suggestive of mild to moderate diffuse encephalopathy, nonspecific etiology. No seizures or epileptiform discharges were seen throughout the recording. Priyanka Barbra Sarks    Assessment and Plan:   Lung mass with brain lesions, stage IV metastatic lung cancer until proven otherwise The findings is most consistent with metastatic lung cancer rather than metastatic breast cancer I have reviewed findings of her PET CT scan and MRI with the patient, her husband, her stepdaughter Optometrist) as well as Dorothy Li, her healthcare power of attorney She has been seen by neurosurgery and due to having multiple brain lesions, surgery is not recommended I note plans for a goals of care discussion with the patient's healthcare power of  attorney tomorrow morning at 10 AM -the patient likely has metastatic lung cancer and she has a poor performance status.  Any treatment would be palliative.  At this point, she does not seem to be a good candidate for systemic therapy.  I estimated her ECOG performance status score at 3.  I am not optimistic, especially with descriptive findings on her MRI brain suggestive of leptomeningeal disease, her overall prognosis is very poor She has significant multiple comorbidities and appears cachectic I do not believe she could recover to the point of getting strong enough to tolerate palliative chemotherapy I explained the rationale of why surgery is not indicated to her family Radiation oncology has been consulted; I would defer to them for further discussion about the role of palliative radiation treatment I am hesitant to recommend putting her through lung biopsy in this situation where her cancer is recurrent consider terminal In the meantime, I recommend we continue dexamethasone I will proceed to consult palliative care service to follow along I apologized to her family that I will not be able to participate in goals of care discussion tomorrow but I would highly recommend consideration for palliative care with hospice service.  Left hemiparesis Likely due to her brain metastases Will monitor for improvement now that she is on dexamethasone Unlikely to improve even with palliative radiation treatment  Seizure-like activity EEG negative for seizures Neurology following and she has been started on dexamethasone and Keppra Further management per neurology  Malignant cachexia She felt that her appetite has improved while on dexamethasone Continue to see  A. fib with RVR Continue medical management  History of stage Ia invasive ductal carcinoma of the right breast, ER/PR positive, HER2 negative Status post right lumpectomy 01/19/2012 followed by radiation therapy and adjuvant antiestrogen  therapy  Goals of care discussion I did not get a sense that the patient understands her situation I did explain to her and family that she have stage IV cancer which is incurable I reviewed the plan of care with her medical healthcare power of attorney and attempted to discuss CODE STATUS She is undecided In situation like this, I would recommend consideration of changing her CODE STATUS to DO NOT RESUSCITATE but I would defer to the primary service and palliative care to continue to visit with her and the family for further discussion about goals of care I discussed prognosis briefly with her healthcare power of attorney I estimated her longevity to be less than 3 months  Discharge planning Indeterminate Please call if questions arise  Thank you for this referral.   Mikey Bussing, DNP, AGPCNP-BC, AOCNP  Heath Lark, MD

## 2021-01-09 NOTE — Progress Notes (Signed)
Subjective: Overnight events: No acute overnight events  Patient more awake this morning and participatory in care. Says she did not get a goodnight sleep but is doing better. Was not aware of ED imaging findings because she was out of it.   No complaint of pain. Says her  left sided weakness started about a week ago. Reports having seizures that typically lasts one to two minutes over the past week, and that the episode in the ED just dragged on. It was the longest episode she's had. Says she didn't know those twitching were seizures. Never loss consciousness during those episodes but occasionally becomes incontinent of urine.  Interval History: Patient aware of imaging findings of metastatic disease to the brain. Husband was at bedside this afternoon. Patient and husband would like a family meeting to discuss findings and make decision.  Objective: Vital signs in last 24 hours: Vitals:   01/08/21 2331 01/09/21 0000 01/09/21 0400 01/09/21 0810  BP: 139/72  125/71 136/70  Pulse: 95 90 99 92  Resp: 18 17 15 15   Temp: 98.5 F (36.9 C)  98.3 F (36.8 C) 98.5 F (36.9 C)  TempSrc: Oral  Oral Oral  SpO2: 96% 98% 96% 100%  Weight:      Height:       Weight change:   Intake/Output Summary (Last 24 hours) at 01/09/2021 1310 Last data filed at 01/09/2021 0900 Gross per 24 hour  Intake 540 ml  Output --  Net 540 ml   Lab Results: Results for orders placed or performed during the hospital encounter of 01/07/21 (from the past 24 hour(s))  Urinalysis, Routine w reflex microscopic Urine, Random     Status: Abnormal   Collection Time: 01/08/21  4:48 PM  Result Value Ref Range   Color, Urine STRAW (A) YELLOW   APPearance CLEAR CLEAR   Specific Gravity, Urine 1.006 1.005 - 1.030   pH 7.0 5.0 - 8.0   Glucose, UA NEGATIVE NEGATIVE mg/dL   Hgb urine dipstick SMALL (A) NEGATIVE   Bilirubin Urine NEGATIVE NEGATIVE   Ketones, ur NEGATIVE NEGATIVE mg/dL   Protein, ur NEGATIVE NEGATIVE mg/dL    Nitrite NEGATIVE NEGATIVE   Leukocytes,Ua NEGATIVE NEGATIVE   RBC / HPF 0-5 0 - 5 RBC/hpf   WBC, UA 0-5 0 - 5 WBC/hpf   Bacteria, UA NONE SEEN NONE SEEN   Squamous Epithelial / LPF 6-10 0 - 5   Budding Yeast PRESENT   Magnesium     Status: Abnormal   Collection Time: 01/08/21  4:58 PM  Result Value Ref Range   Magnesium 1.5 (L) 1.7 - 2.4 mg/dL  Phosphorus     Status: None   Collection Time: 01/08/21  4:58 PM  Result Value Ref Range   Phosphorus 4.5 2.5 - 4.6 mg/dL  Protime-INR     Status: Abnormal   Collection Time: 01/08/21  4:58 PM  Result Value Ref Range   Prothrombin Time 17.5 (H) 11.4 - 15.2 seconds   INR 1.4 (H) 0.8 - 1.2  APTT     Status: None   Collection Time: 01/08/21  4:58 PM  Result Value Ref Range   aPTT 29 24 - 36 seconds  TSH     Status: None   Collection Time: 01/08/21  4:59 PM  Result Value Ref Range   TSH 1.422 0.350 - 4.500 uIU/mL  Glucose, capillary     Status: None   Collection Time: 01/08/21 11:41 PM  Result Value Ref Range   Glucose-Capillary  98 70 - 99 mg/dL  Comprehensive metabolic panel     Status: Abnormal   Collection Time: 01/09/21  4:20 AM  Result Value Ref Range   Sodium 136 135 - 145 mmol/L   Potassium 3.4 (L) 3.5 - 5.1 mmol/L   Chloride 105 98 - 111 mmol/L   CO2 23 22 - 32 mmol/L   Glucose, Bld 91 70 - 99 mg/dL   BUN 18 8 - 23 mg/dL   Creatinine, Ser 0.81 0.44 - 1.00 mg/dL   Calcium 9.5 8.9 - 10.3 mg/dL   Total Protein 6.5 6.5 - 8.1 g/dL   Albumin 3.2 (L) 3.5 - 5.0 g/dL   AST 21 15 - 41 U/L   ALT 12 0 - 44 U/L   Alkaline Phosphatase 75 38 - 126 U/L   Total Bilirubin 0.8 0.3 - 1.2 mg/dL   GFR, Estimated >60 >60 mL/min   Anion gap 8 5 - 15  CBC     Status: Abnormal   Collection Time: 01/09/21  4:20 AM  Result Value Ref Range   WBC 5.9 4.0 - 10.5 K/uL   RBC 3.19 (L) 3.87 - 5.11 MIL/uL   Hemoglobin 9.7 (L) 12.0 - 15.0 g/dL   HCT 29.7 (L) 36.0 - 46.0 %   MCV 93.1 80.0 - 100.0 fL   MCH 30.4 26.0 - 34.0 pg   MCHC 32.7 30.0 - 36.0  g/dL   RDW 14.4 11.5 - 15.5 %   Platelets 226 150 - 400 K/uL   nRBC 0.0 0.0 - 0.2 %    Micro Results:  Studies/Results:  Medications:  Scheduled Meds:  aspirin EC  81 mg Oral Daily   carvedilol  6.25 mg Oral BID WC   dexamethasone (DECADRON) injection  10 mg Intravenous Once   dexamethasone  4 mg Oral Q12H   enoxaparin (LOVENOX) injection  40 mg Subcutaneous Q24H   levETIRAcetam  750 mg Oral BID   sodium chloride flush  3 mL Intravenous Q12H   General: Frail well-appearing  laying in bed. No acute distress. Head: Normocephalic. Atraumatic. CV: RRR. No murmurs, rubs, or gallops. No LE edema Pulmonary: Lungs CTAB. Normal effort. No wheezing or rales. Abdominal: Soft, nontender, nondistended. Normal bowel sounds. Extremities: Palpable pulses. Normal ROM. Skin: Warm and dry. No obvious rash or lesions. Neuro: A&Ox3. Moves all extremities. Normal sensation. No focal deficit. Psych: Normal mood and affect   Continuous Infusions: PRN Meds:.acetaminophen **OR** acetaminophen Assessment/Plan: Dorothy Li is a 75 y.o female with a history CAD,HTN, carotid stenosis, remote history of breast cancer s/p lumpectomy and anastrozole therapy, and recently enlarging cavitary lesion concerning for primary lung disease who was admitted for partial simple seizure with recent imaging finding concerning for multifocal metastatic disease to the brain. She is currently undergoing medical stabilization. Given multiple medical co-morbidities, patient may benefit from goals of care discussion and has requested family meeting.  Active Problems:   Brain edema (HCC)   Seizure (HCC)   Left hemiparesis ([place])  Metastatic disease to the brain, complicated by seizures and vasogenic edema  Patient with week long partial simple seizures and left sided weakness. Had a witnessed seizure-like activity in the ED yesterday. CT head from yesterday showed diffuse edema in posterior frontal lobe and possible mass in the  temporal lobe. MRI finding from today shows up to ten diffuse enhancing lesion with the largest one being a right posterior frontal lobe lesion as well as significant vasogenic edema. The findings were consistent with metastatic disease.  The primary cancer is is unclear but likely bronchogenic malignancy given April CT findings of cavitary lung lesions that had interval increase in size as well as prominent hilar lymphadenopathies. Could also be breast cancer given remote history. Was seen by neurosurgery who deferred surgical intervention at this time and would like to pan-scan the spine to further characterize degree of metastasis. Her seizures and related symptoms are likely from the mass effect from metastatic lesions and the vasogenic edema. Patient aware of results and would like some time to process it. Family meeting tomorrow 7/9. -start Dexamethasone, and SSI -Continue keppra -Family meeting tomorrow to discuss goals of care -MRI cervical, thoracic and lumbar spine  Left Hemiparesis Patient developed new left side hemiparesis over the past week. Significant flaccid paresis of left side noted on physical exams. Patient thought it was due to swelling in her upper extremity but no swelling was appreciated on exam. Imaging findings of mass in right temporal lobe as well as prominent frontal lobe likely in premotor strip explains the physical exam findings. [place] of upper extremity to rule out DVT, though patient at increased risk of clotting in light of malignant disease.  This is a Careers information officer Note.  The care of the patient was discussed with Dr. Velna Ochs, MD and the assessment and plan formulated with their assistance.  Please see their attached note for official documentation of the daily encounter.   LOS: 1 day   AsempahCatalina Pizza, Medical Student 01/09/2021, 1:10 PM

## 2021-01-09 NOTE — Progress Notes (Signed)
Neurology Progress Note  S: Doesn't know how she feels. Has a frontal HA at times, but not at this moment. No dysphagia, blurry or double vision, numbness or tingling. Her left leg is weaker than right. Her LUE has no movement. States she gets arch pain at times. States her Rt hand is "shaky, shaky".   O: Current vital signs: BP 136/70 (BP Location: Right Arm)   Pulse 92   Temp 98.5 F (36.9 C) (Oral)   Resp 15   Ht 5\' 8"  (1.727 m)   Wt 52.1 kg   SpO2 100%   BMI 17.46 kg/m  Vital signs in last 24 hours: Temp:  [98 F (36.7 C)-99 F (37.2 C)] 98.5 F (36.9 C) (07/08 0810) Pulse Rate:  [90-114] 92 (07/08 0810) Resp:  [15-33] 15 (07/08 0810) BP: (113-159)/(56-110) 136/70 (07/08 0810) SpO2:  [94 %-100 %] 100 % (07/08 0810)  GENERAL: Chronically ill appearing, frail female. Awake, alert in NAD. HEENT: Normocephalic and atraumatic. LUNGS: Normal respiratory effort.  CV: RRR on tele.  Ext: warm. Psych: Affect is light.   NEURO:  Mental Status: Alert  and oriented x 3. Speech/Language: speech is without aphasia or dysarthria.  Naming, repetition, fluency, and comprehension intact.  Cranial Nerves:  II: PERRL. Visual fields full.  III, IV, VI: EOMI. Eyelids elevate symmetrically.  V: Sensation is intact to light touch and symmetrical to face.  VII: Smile is symmetrical.  VIII: hearing intact to voice. IX, X: Palate elevates symmetrically. Phonation is normal.  LF:[id number] shrug 5/5. XII: tongue is midline without fasciculations. Motor:  RUE: grip 4-/5      bicep 4/-5     tricep 4-/5. She is able to lift RUE off bed and hold it.  RLE: thigh 4+/5    knee 4+/5   plantar flexion 4-/5   dorsiflexion 4-/5 LUE: Significant left motor neglect, but she does move her left arm some.  LLE: thigh 4/5    knee 4/5    plantar flexion 4-/5    dorsiflexion 3+/5 Tone: Decreased tone LUE. Increased in LLE. Bulk is decreased.  Sensation- Intact to light touch bilaterally. Extinction absent to  light touch to DSS.    Coordination: Resting tremor right hand not noted with FTN. No drift RUE/RLE. Slight drift to LLE.   DTRs:  Hyperreflexic to bilateral brachioradialis, biceps, and patella.  Gait- deferred.  Medications  Current Facility-Administered Medications:    acetaminophen (TYLENOL) tablet 650 mg, 650 mg, Oral, Q6H PRN **OR** acetaminophen (TYLENOL) suppository 650 mg, 650 mg, Rectal, Q6H PRN, [name], MD   aspirin EC tablet 81 mg, 81 mg, Oral, Daily, [name], MD, 81 mg at 01/08/21 2010   carvedilol (COREG) tablet 6.25 mg, 6.25 mg, Oral, BID WC, [name], MD, 6.25 mg at 01/08/21 1706   enoxaparin (LOVENOX) injection 40 mg, 40 mg, Subcutaneous, Q24H, [name], MD, 40 mg at 01/08/21 1708   levETIRAcetam (KEPPRA) tablet 500 mg, 500 mg, Oral, BID, Ebbie Latus, Stevi W, NP, 500 mg at 01/08/21 2246   sodium chloride flush (NS) 0.9 % injection 3 mL, 3 mL, Intravenous, Q12H, [name], MD, 3 mL at 01/08/21 2246  Pertinent Labs TSH and cortisol are pending.   Imaging/Test: EEG 01/08/21: This study is suggestive of mild to moderate diffuse encephalopathy, nonspecific etiology. No seizures or epileptiform discharges were seen throughout the recording.  MRI brain with and without contrast pending.   Assessment: 75 yo female who presented for BLE edema, LUE edema and 2  episodes of LUE jerking accompanied by staring and dysarthric speech. CTH concerning metastatic lesions due to patient's history of breast cancer. EEG is negative for seizures, but given concern for brain metastases, should continue AED. MRI brain with and without contrast is pending to better discern lesions and edema. Doubt PRES due to controlled BP throughout stay.   Impression: -LUE weakness.  -Concern for metastatic lesions and edema on CTH.  -Remote history of breast cancer in 2013.   Recommendations/Plan:  -F/up TSH, cortisol.  -F/up MRI brain with and without contrast.  -Steroids may  be considered based on MRI brain.  -Continue Keppra 500mg  po q12 hours.  -In patient seizure precautions.  -Ativan 2mg  IV for seizure lasting over 5 minutes and notify Neurology.    Pt seen by Clance Boll, MSN, APN-BC/Nurse Practitioner/Neuro and later by MD. Note and plan to be edited as needed by MD.  Pager: [phone]  I have seen the patient reviewed the above note.  She reports that she had an episode of seizure this morning to me.  She continues to have a left hemineglect including motor neglect, but does move her left arm some.  Her MRI has just been completed and looks like multifocal metastatic disease to me.  We can follow-up the formal read, but I will go ahead and start Decadron 4 mg every 12H after initial dose of 10mg   I will also increase her Keppra to 750 twice daily.  Roland Rack, MD [facility] [phone]  If 7pm- 7am, please page neurology on call as listed in [place].

## 2021-01-09 NOTE — Consult Note (Addendum)
Reason for Consult:mets to brain Referring Physician: internal med  Dorothy Li is an 75 y.o. female.   HPI:  75 year old female presented to the hospital 2 days ago after progressive left sided weakness. States that she has been weak for 2 weeks now. Per nurse, her niece said shes been weak for 1 week. She has a history of breast cancer in 2013. In May she had a PET scan which showed a lesion in her lung as well. Not sure she ever had any follow up after this was found. Her husband is at the bedside. Also has a history of ARF, CAD, ETOH abuse, and ICD placement. At home she has been using a walker and wheelchair to get around. Lives at home with her husband. She has also been having seizures since being admitted. CT head showed suspicion for metastatic lesions and an MRI was obtained. States that she wants to be aggressive with her care.   Past Medical History:  Diagnosis Date   ARF (acute renal failure) ([place])    in setting dehydration with ACE I use    Arthritis    Breast cancer ([place]) 12/08/11   ER/PR +, breast 10 o'clock dx=invasive Ductal Ca,,dcis   CAD (coronary artery disease)    LHC (4/11) showed 75% ostial LM stenosis with significant damping upon catheter engagement, 60-70% mLAD, 60% ostial D1, EF 30%.  Patient had CABG with LIMA-LAD and SVG-OM2.    Cardiomyopathy    Echo (4/11) with EF 25%, diffuse hypokinesis, moderate LV dilation, decreased RV  systolic function but normal RV size, PA systolic pressure 35 mmHg. Lexiscan myoview (4/11): EF 29%,  diffuse hypokinesis, basal anteroseptal and apical scar, no ischemia.  The cardiomyopathy is likely due to a combination of CAD (LM stenosis) and ETOH abuse.    Carotid stenosis    carotid dopplers (7/56) with 43-32% LICA stenosis.  Carotid dopplers (95/18): 84-16%  LICA stenosis.   ETOH abuse     Elevated AST, mildly elevated INR (? component of cirrhosis). Has now quit ETOH.    HTN (hypertension)    Hyperlipemia    ICD (implantable  cardiac defibrillator) in place Medtronic   Interrogated on 11/18/11   Nephrolithiasis    Rash    from tape    S/P radiation therapy 03/22/12 - 05/02/12   Right Breast: 50 gray/25Fractions with Boost of 10gray/5 Fractions   Stress fracture of right foot 01/17/12   Dr. Vangie Bicker   Syncope    none since ICD placement   Use of anastrozole (Arimidex) 05/12/12   VT (ventricular tachycardia) ([place])    Dual chamber Medtronic ICD placed 7/11    Past Surgical History:  Procedure Laterality Date   ABDOMINAL HYSTERECTOMY     TAH-BSO, fibroids, age 34   BREAST BIOPSY  12/08/11   Right Breast, Upper Outer Quadrant: Invasive Ductal:DCIS   BREAST LUMPECTOMY  01-19-12   right lumpectomy/   CARDIAC CATHETERIZATION     COLONOSCOPY  04/11/2012   Procedure: COLONOSCOPY;  Surgeon: Jerene Bears, MD;  Location: [facility];  Service: Gastroenterology;  Laterality: N/A;   CORONARY ARTERY BYPASS GRAFT  4/11    x 2   ICD implantation  2011   ICD-Medtronic    Remote - No /  Hx of VT, LU chest    Allergies  Allergen Reactions   Penicillins Other (See Comments)    "blacks out" per pt    Latex Hives and Other (See Comments)    Gloves  Tape Itching and Rash    Bandage on breast from bx caused rash    Social History   Tobacco Use   Smoking status: Former    Packs/day: 1.00    Years: 40.00    Pack years: 40.00    Types: Cigarettes    Quit date: 10/08/2009    Years since quitting: 11.2   Smokeless tobacco: Never  Substance Use Topics   Alcohol use: Yes    Comment: Prior hx 2 drinks daily.  quit 09/2009    Family History  Problem Relation Age of Onset   Heart disease Sister        Had surgery   Heart failure Sister    Diabetes Mother    Breast cancer Cousin    Colon cancer Neg Hx      Review of Systems  Positive ROS: as above  All other systems have been reviewed and were otherwise negative with the exception of those mentioned in the HPI and as above.  Objective: Vital signs in last 24  hours: Temp:  [98 F (36.7 C)-99 F (37.2 C)] 98.5 F (36.9 C) (07/08 0810) Pulse Rate:  [90-114] 92 (07/08 0810) Resp:  [15-33] 15 (07/08 0810) BP: (113-150)/(65-110) 136/70 (07/08 0810) SpO2:  [96 %-100 %] 100 % (07/08 0810)  General Appearance: Alert, cooperative, no distress, appears stated age Head: Normocephalic, without obvious abnormality, atraumatic Eyes: PERRL, conjunctiva/corneas clear, EOM's intact, fundi benign, both eyes      Back: Symmetric, no curvature, ROM normal, no CVA tenderness Lungs: respirations unlabored Heart: Regular rate and rhythm, Extremities: Extremities normal, atraumatic, no cyanosis or edema Pulses: 2+ and symmetric all extremities Skin: Skin color, texture, turgor normal, no rashes or lesions  NEUROLOGIC:   Mental status: A&O x4, no aphasia, good attention span, Memory and fund of knowledge Motor Exam - Left sided hemiparesis Sensory Exam - grossly normal Reflexes: hyperreflexic  Coordination - grossly normal Gait - uta  Balance - uta  Cranial Nerves: I: smell Not tested  II: visual acuity  OS: na    OD: na  II: visual fields Full to confrontation  II: pupils Equal, round, reactive to light  III,VII: ptosis None  III,IV,VI: extraocular muscles  Full ROM  V: mastication Normal  V: facial light touch sensation  Normal  V,VII: corneal reflex  Present  VII: facial muscle function - upper  Normal  VII: facial muscle function - lower Normal  VIII: hearing Not tested  IX: soft palate elevation  Normal  IX,X: gag reflex Present  XI: trapezius strength  5/5  XI: sternocleidomastoid strength 5/5  XI: neck flexion strength  5/5  XII: tongue strength  Normal    Data Review Lab Results  Component Value Date   WBC 5.9 01/09/2021   HGB 9.7 (L) 01/09/2021   HCT 29.7 (L) 01/09/2021   MCV 93.1 01/09/2021   PLT 226 01/09/2021   Lab Results  Component Value Date   NA 136 01/09/2021   K 3.4 (L) 01/09/2021   CL 105 01/09/2021   CO2 23  01/09/2021   BUN 18 01/09/2021   CREATININE 0.81 01/09/2021   GLUCOSE 91 01/09/2021   Lab Results  Component Value Date   INR 1.4 (H) 01/08/2021    Radiology: CT Head Wo Contrast  Result Date: 01/08/2021 CLINICAL DATA:  Neuro deficit, acute, stroke suspected. Left arm weakness for 4 days. Altered mental status. EXAM: CT HEAD WITHOUT CONTRAST TECHNIQUE: Contiguous axial images were obtained from the base of  the skull through the vertex without intravenous contrast. COMPARISON:  Prior head CT 11/11/2020. FINDINGS: Brain: Cerebral volume is normal for age. There is extensive white matter, and to a lesser degree cortical, edema within the posterior frontal lobes and parietal lobes (right greater than left). Subtle patchy abnormal hypodensity is also questioned within the bilateral occipital lobes. Additional cortical/subcortical hypodensity within the medial right temporal lobe/hippocampus. Suggestion of a 9 mm superimposed medial right temporal lobe mass (series 4, image 14). No evidence of acute intracranial hemorrhage. No extra-axial fluid collection. No midline shift. Vascular: No hyperdense vessel.  Atherosclerotic calcifications. Skull: Normal. Negative for fracture or focal lesion. Sinuses/Orbits: Visualized orbits show no acute finding. 2.1 cm right maxillary sinus mucous retention cyst. Mild left maxillary sinus mucosal thickening. Trace right frontal and bilateral ethmoid sinus mucosal thickening. These results were called by telephone at the time of interpretation on 01/08/2021 at 9:48 am to provider [facility] , who verbally acknowledged these results. IMPRESSION: Extensive white matter, and to a lesser degree cortical, edema within the posterior frontal lobes and parietal lobes (right greater than left). Subtle patchy abnormal hypodensity is also questioned within the bilateral occipital lobes. 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 However, underlying  metastatic lesions are also a consideration and a contrast-enhanced brain MRI is recommended for further evaluation. Additional cortical/subcortical hypodensity within the medial right temporal lobe/hippocampus. A superimposed 9 mm medial right temporal lobe mass is also questioned, and this may reflect edema related to a metastatic lesion. However, alternative etiologies such as encephalitis (including herpes encephalitis), seizure related edema or acute right PCA territory ischemia cannot be excluded. This too would be better characterized with a contrast-enhanced brain MRI. No midline shift or herniation. Paranasal sinus disease, as described. Electronically Signed   By: Kellie Simmering DO   On: 01/08/2021 09:48   EEG adult  Result Date: 01/08/2021 Lora Havens, MD     01/08/2021 12:40 PM Patient Name: Dorothy Li MRN: [id number] Epilepsy Attending: Lora Havens Referring Physician/Provider: Anibal Henderson, NP Date: 01/08/2021 Duration: 23.48 mins Patient history: 75 year old female with history of right breast cancer status posttreatment who was noted to have left arm weakness and twitching.  EEG to evaluate for seizures. Level of alertness: Awake, asleep AEDs during EEG study: Ativan, Keppra Technical aspects: This EEG study was done with scalp electrodes positioned according to the 10-20 International system of electrode placement. Electrical activity was acquired at a sampling rate of 500Hz  and reviewed with a high frequency filter of 70Hz  and a low frequency filter of 1Hz . EEG data were recorded continuously and digitally stored. Description: The posterior dominant rhythm consists of 8-9 Hz activity of moderate voltage (25-35 uV) seen predominantly in posterior head regions, symmetric and reactive to eye opening and eye closing. Sleep was characterized by vertex waves, sleep spindles (12 to 14 Hz), maximal frontocentral region. EEG showed continuous generalized 5 to 6 Hz theta-delta slowing with  overriding 15 to 18 Hz beta activity distributed symmetrically and diffusely. Hyperventilation and photic stimulation were not performed.   ABNORMALITY - Continuous slow, generalized - Excessive beta, generalized IMPRESSION: This study is suggestive of mild to moderate diffuse encephalopathy, nonspecific etiology. No seizures or epileptiform discharges were seen throughout the recording. Lora Havens    Assessment/Plan: 75 year old female presented to the hospital 2 days ago with left sided hemiparesis that has been going on for 2 weeks now. We were called in regards to her MRI results. This  showed multiple metastatic lesions with the largest one being a right posterior frontal lobe lesion, likely in her premotor strip. There appears to be 10 different lesions but the MRI was motion degraded so difficult to really visualize the lesions. At this point I believe that we need to get rad onc, medicine onc, and pulmonology involved. She is also hyperreflexic so I would like to MRI cervical, thoracic, and lumbar spine to assess for metastatic lesions to her spine. At this point I don't think that surgical intervention is warranted until we get a better picture of her metastic disease. Will increase her decadron to 4mg  Q6 hours as well.    Dorothy Li Dorothy Li 01/09/2021 2:12 PM

## 2021-01-10 ENCOUNTER — Encounter: Payer: Self-pay | Admitting: Radiation Oncology

## 2021-01-10 LAB — GLUCOSE, CAPILLARY
Glucose-Capillary: 115 mg/dL — ABNORMAL HIGH (ref 70–99)
Glucose-Capillary: 211 mg/dL — ABNORMAL HIGH (ref 70–99)
Glucose-Capillary: 145 mg/dL — ABNORMAL HIGH (ref 70–99)
Glucose-Capillary: 187 mg/dL — ABNORMAL HIGH (ref 70–99)

## 2021-01-10 MED ORDER — SENNA 8.6 MG PO TABS: 1.0000 | ORAL_TABLET | Freq: Every evening | ORAL | Status: DC | PRN

## 2021-01-10 MED ORDER — LORAZEPAM 2 MG/ML PO CONC
1.0000 mg | ORAL | Status: DC | PRN
  Filled 2021-01-10: qty 0.5

## 2021-01-10 MED ORDER — LORAZEPAM 2 MG/ML IJ SOLN: 1.0000 mg | INTRAMUSCULAR | Status: DC | PRN

## 2021-01-10 MED ORDER — OXYCODONE HCL 5 MG PO TABS: 5.0000 mg | ORAL_TABLET | ORAL | Status: DC | PRN

## 2021-01-10 MED ORDER — LORAZEPAM 1 MG PO TABS: 1.0000 mg | ORAL_TABLET | ORAL | Status: DC | PRN

## 2021-01-10 NOTE — Progress Notes (Signed)
NEUROSURGERY PROGRESS NOTE  No acute changes overnight.  At this point I think the family is wanting palliative care involved.  Therefore, we will cancel the MRIs of her cervical thoracic and lumbar spine.  Oncology is giving her 3 months of life.  We will sign off for now.  Please call with any questions or concerns.  Temp:  [98.5 F (36.9 C)-98.9 F (37.2 C)] 98.5 F (36.9 C) (07/09 0759) Pulse Rate:  [88-110] 88 (07/09 0759) Resp:  [14-20] 14 (07/09 0759) BP: (114-148)/(66-93) 141/93 (07/09 0759) SpO2:  [91 %-98 %] 98 % (07/09 0759) Weight:  [58.4 kg] 58.4 kg (07/09 0500)   Dorothy Chiquito, NP 01/10/2021 9:51 AM

## 2021-01-10 NOTE — Progress Notes (Addendum)
Subjective: Overnight events: No acute overnight events Patient says she is doing alright.Had a good night sleep and happy her husband is at bedside. Says her favorite person in the hospital is the lady that brings in her food every time, jokes if she could take that person home. Overall in good spirit despite news from yesterday. "It is what it is"  Objective: Vital signs in last 24 hours: Vitals:   01/09/21 1928 01/09/21 2318 01/10/21 0355 01/10/21 0500  BP: (!) 148/84 134/67 135/76   Pulse: (!) 105 92 89   Resp: 16 19 18    Temp: 98.9 F (37.2 C) 98.5 F (36.9 C) 98.5 F (36.9 C)   TempSrc: Oral Oral Oral   SpO2: 91% 96% 97%   Weight:    58.4 kg  Height:       Weight change:   Intake/Output Summary (Last 24 hours) at 01/10/2021 5885 Last data filed at 01/10/2021 0414 Gross per 24 hour  Intake 700 ml  Output 550 ml  Net 150 ml   Lab Results: Results for orders placed or performed during the hospital encounter of 01/07/21 (from the past 24 hour(s))  Glucose, capillary     Status: Abnormal   Collection Time: 01/09/21  4:56 PM  Result Value Ref Range   Glucose-Capillary 100 (H) 70 - 99 mg/dL  Glucose, capillary     Status: Abnormal   Collection Time: 01/09/21 10:31 PM  Result Value Ref Range   Glucose-Capillary 193 (H) 70 - 99 mg/dL  Glucose, capillary     Status: Abnormal   Collection Time: 01/10/21  7:57 AM  Result Value Ref Range   Glucose-Capillary 115 (H) 70 - 99 mg/dL   Comment 1 Notify RN    Comment 2 Document in Chart     Micro Results:  Studies/Results:  Medications:  Scheduled Meds:  aspirin EC  81 mg Oral Daily   carvedilol  6.25 mg Oral BID WC   dexamethasone  4 mg Oral Q6H   enoxaparin (LOVENOX) injection  40 mg Subcutaneous Q24H   insulin aspart  0-15 Units Subcutaneous TID WC   insulin aspart  0-5 Units Subcutaneous QHS   levETIRAcetam  750 mg Oral BID   sodium chloride flush  3 mL Intravenous Q12H   General: Frail-appearing, cachectic elderly  female sitting up in bed eating breakfast. No acute distress. Head: Normocephalic. Atraumatic. CV: RRR. No murmurs, rubs, or gallops. No LE edema Pulmonary: Lungs CTAB. Normal effort. No wheezing or rales. Abdominal: Soft, nontender, nondistended. Normal bowel sounds. Extremities: Palpable pulses. Normal ROM on right side. Able to partially wiggle left toes. Skin: Warm and dry. No obvious rash or lesions. Neuro: A&O x3, normal strength on right side, Left sided hemiparesis Psych: Normal mood and affect   Assessment/Plan: Dorothy Li is a 75 y.o female with a history CAD,HTN, carotid stenosis, remote history of breast cancer s/p lumpectomy and anastrozole therapy, and recently discovered enlarging lung lesion concerning for primary lung  cancer who was admitted for multifocal metastatic brain disease after presenting with partial simple seizures and left sided hemiparesis. She is currently undergoing medical stabilization and decision about  patient's goal of care pending family meeting later today, 7/9  Active Problems:   Brain edema ([place])   Seizure ([place])   Left hemiparesis ([place])  Metastatic disease to the brain, complicated by seizures and vasogenic edema  Presented with week long partial simple seizures and left sided weakness. Had a witnessed seizure-like activity in the ED .  CT head showed diffuse edema in posterior frontal lobe and possible mass in the temporal lobe. Follow up  MRI finding showed diffuse vasogenic edema and up to ten diffuse enhancing lesion with the largest one being a right posterior frontal lobe lesion. The findings were consistent with metastatic disease. The primary cancer is likely bronchogenic malignancy given April CT findings of cavitary lung lesions that had interval increase in size as well as prominent hilar lymphadenopathies.  Was seen by neurosurgery who deferred surgical intervention at this time and would like to pan-scan the spine to further characterize  degree of metastasis.  Patient evaluated by medical oncology who deferred palliative chemotherapy given other significant multiple comorbidities and appears cachexia and  do not believe she could recover to the point of getting strong enough to tolerate palliative chemotherapy  No seizure-like activity observed since the episode in the ED.Patient continues to have significant interval improvement in mentation and as well as appetite since initiation of systemic steroid yesterday 7/8. Patient yet to decide goals of care pending family meeting later today.  -Continue Dexamethasone, and SSI -Continue keppra -Family meeting to discuss goals of care -MRI cervical, thoracic and lumbar spine  Left Hemiparesis, likely due to brain mets Patient developed new left side hemiparesis over the past week. Significant flaccid paresis of left side noted on physical exams. Patient thought it was due to swelling in her upper extremity but no swelling was appreciated on exam. Imaging findings of mass in right temporal lobe as well as prominent frontal lobe likely in premotor strip explains the physical exam findings. [place] of upper extremity to rule out DVT pending. -Continue home Lovenox  Malignant cachexia She felt that her appetite has improved while on dexamethasone -Continue diet as tolerated  History of stage Ia invasive ductal carcinoma of the right breast, ER/PR positive, HER2 negative Status post right lumpectomy 01/19/2012 followed by radiation therapy and adjuvant antiestrogen therapy  This is a Careers information officer Note.  The care of the patient was discussed with  Dr. Tamsen Snider and the assessment and plan formulated with their assistance.  Please see their attached note for official documentation of the daily encounter.   LOS: 2 days   [place], Dorothy Li, Medical Student 01/10/2021, 8:52 AM   Attestation for Student Documentation:  I personally was present and performed or re-performed the history,  physical exam and medical decision-making activities of this service and have verified that the service and findings are accurately documented in the student's note.  We had a goals of care discussion with Dorothy Li's family today including her niece who is her healthcare power of attorney.  Her husband was also present multiple nieces ,and daughter.  Patient and her family have decided to move to hospice care at home.  After discussing with radiation oncology there was a question whether or not they would pursue palliative radiation.  They have informed me they will not pursue radiation.  [facility] has been consulted and supplies are being delivered to Dorothy Li's home.  CODE STATUS was changed to DNR.  Unsuccessful attempts were made to reach [employer] to have ICD turned off today, but will reattempt tomorrow.  Plan to discharge to home hospice if everything is in place tomorrow.  Dorothy Rob, MD 01/10/2021, 5:16 PM

## 2021-01-10 NOTE — Progress Notes (Addendum)
   [id number] Manufacturing engineer [facility]) Hospital Liaison Note    Addendum: Pt has been deemed hospice eligible by [facility] MD.   Notified by Transition of Care Manger of patient/family request for [facility] services at home after discharge. Chart and patient information under review by [facility] physician. Hospice eligibility pending currently.     Writer spoke with POA niece [name] to initiate education related to hospice philosophy, services and team approach to care.           verbalized understanding of information given. Discussed with [name] pros and cons of AICD at this stage.  Cathy clear in wanting ICD discontinued prior to discharge.  Dr. [name] made aware, reports plan to contact device rep to have AICD discontinued today. Per discussion, plan is for discharge home today by private vehicle.    Please send signed and completed DNR form home with patient/family.   Please provide prescriptions for mediations at discharge to ensure comfort until patient can be admitted onto hospice services.   DME needs have been discussed.  Patient currently has the following equipment in the home:  bedside commode. .  Patient/family requests the following DME for delivery to the home:  hospital bed.  [facility] has ordered a bed for STAT delivery. Home address has been verified and is correct in the chart.     [facility] aware of the above. Please notify [facility] when patient is ready to leave the unit at discharge. (Call [phone] or [phone] after 5pm.)  [facility] information and contact numbers given to [facility].       Please do not hesitate to call with questions.  Thank you for the opportunity to participate in this patient's care.  Domenic Moras, BSN, RN       [place] (listed on AMION under [phone]  [phone] (24h on call)

## 2021-01-10 NOTE — TOC Initial Note (Signed)
Transition of Care [facility]) - Initial/Assessment Note    Patient Details  Name: Dorothy Li MRN: [id number] Date of Birth: Apr 16, 1946  Transition of [facility]) CM/SW Contact:    Bartholomew Crews, RN Phone Number: [phone] 01/10/2021, 12:30 PM  Clinical Narrative:                  Received referral for home hospice from MD. Spoke with patient, spouse, and niece, [name], at the bedside. Offered choice of hospice agency. Referral accepted by [facility]. Patient will need hospital bed prior to patient discharge. Family to provide transport home at discharge. [facility] to admit patient to hospice services on Monday. TOC following.   Expected Discharge Plan: Home w Hospice Care Barriers to Discharge: Other (must enter comment) (coordination of hospice services at home)   Patient Goals and CMS Choice Patient states their goals for this hospitalization and ongoing recovery are:: home with hospice CMS Medicare.gov Compare Post Acute Care list provided to:: Patient Choice offered to / list presented to : Patient, HC POA / Guardian ([name], niece/POA)  Expected Discharge Plan and Services Expected Discharge Plan: Home w Hospice Care In-house Referral: Hospice / Palliative Care Discharge Planning Services: CM Consult Post Acute Care Choice: Hospice Living arrangements for the past 2 months: Single Family Home                 DME Arranged: Hospital bed DME Agency: [facility] and [place] Date DME Agency Contacted: 01/10/21 Time DME Agency Contacted: 1228 Representative spoke with at DME Agency: [place] at [place]: RN [place]: [facility] Date [place]: 01/10/21 Time [place]: 1228 Representative spoke with at [place]: [place] at [place] Arrangements/Services Living arrangements for the past 2 months: [place] Lives with:: Self, Spouse Patient language and need for  interpreter reviewed:: Yes Do you feel safe going back to the place where you live?: Yes      Need for Family Participation in Patient Care: Yes (Comment) Care giver support system in place?: Yes (comment) Current home services: Homehealth aide, DME (caregivers have been set up through [place] and has a 3N1 at home) Criminal Activity/Legal Involvement Pertinent to Current Situation/Hospitalization: No - Comment as needed  Activities of Daily Living Home Assistive Devices/Equipment: Wheelchair, Radio producer (specify quad or straight), Eyeglasses (Quad, bath bench) ADL Screening (condition at time of admission) Patient's cognitive ability adequate to safely complete daily activities?: No Is the patient deaf or have difficulty hearing?: No Does the patient have difficulty seeing, even when wearing glasses/contacts?: No Does the patient have difficulty concentrating, remembering, or making decisions?: Yes Patient able to express need for assistance with ADLs?: Yes Does the patient have difficulty dressing or bathing?: Yes Independently performs ADLs?: No Communication: Independent Dressing (OT): Needs assistance Is this a change from baseline?: Pre-admission baseline Grooming: Needs assistance Is this a change from baseline?: Pre-admission baseline Feeding: Needs assistance Is this a change from baseline?: Pre-admission baseline Bathing: Needs assistance Is this a change from baseline?: Pre-admission baseline Toileting: Needs assistance Is this a change from baseline?: Pre-admission baseline In/Out Bed: Needs assistance Is this a change from baseline?: Pre-admission baseline Walks in Home: Independent with device (comment) (2 wheeled walker) Is this a change from baseline?: Pre-admission baseline Does the patient have difficulty walking or climbing stairs?: Yes Weakness of Legs: Both Weakness of Arms/Hands: Right  Permission Sought/Granted Permission sought to share information with : Family  Supports    Share Information with NAME: [name]     Permission granted to share info w Relationship: niece/POA  Permission granted to share info w Contact Information: [phone]  Emotional Assessment Appearance:: Appears stated age Attitude/Demeanor/Rapport: Engaged Affect (typically observed): Accepting Orientation: : Oriented to Self, Oriented to  Time, Oriented to Place, Oriented to Situation Alcohol / Substance Use: Not Applicable Psych Involvement: No (comment)  Admission diagnosis:  Cerebral edema (HCC) [G93.6] Brain edema (HCC) [G93.6] Seizure ([place]) [R56.9] Left arm weakness [R29.898] Patient Active Problem List   Diagnosis Date Noted   Seizure ([place])    Left hemiparesis ([place])    Brain edema ([place]) 01/08/2021   Dehydration 11/11/2020   Protein-calorie malnutrition, severe 10/30/2020   Pneumonia 10/29/2020   CAD (coronary artery disease) 10/29/2020   Prolonged QT interval 10/29/2020   Parapneumonic effusion 10/29/2020   Multiple falls 10/16/2020   Hypokalemia 10/15/2020   Hypomagnesemia 10/15/2020   EtOH dependence ([place]) 10/15/2020   FTT (failure to thrive) in adult 10/15/2020   Colon polyps 04/11/2012   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   Breast cancer, right breast ([place]) 12/08/2011   Breast cancer ([place]) 12/08/2011   Orthostatic dizziness 03/10/2011   ARF (acute renal failure) ([place]) 02/04/2011   Hypotension 01/26/2011   Pure hypercholesterolemia 07/30/2010   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   CAROTID ARTERY STENOSIS 02/11/2010   CORONARY ATHEROSLERO UNSPEC TYPE BYPASS GRAFT 11/11/2009   CAROTID BRUIT 11/11/2009   Nonischemic cardiomyopathy/ CAD-CABG 10/08/2009   Chronic combined systolic and diastolic CHF (congestive heart failure) ([place]) 10/08/2009   VENTRICULAR TACHYCARDIA 09/16/2009   Essential hypertension 09/05/2009   SYNCOPE 09/05/2009   PCP:  Center, [place]:   [facility] [place] [address] Phone: [phone] Fax: [phone]  [facility] ([place]), [place] - [address] [place]) [place] [address] Phone: [phone] Fax: [place], [place] - [place] [address] [place] [address] Phone: [phone] Fax: [phone]     Social Determinants of Health ([place]) Interventions    Readmission Risk Interventions Readmission Risk Prevention Plan 10/19/2020  Transportation Screening Complete  PCP or Specialist Appt within 3-5 Days Not Complete  Not Complete comments Per [place], pt has appt on June 7.  She will attempt to move this appt up on Monday.  [place] or Home Care Consult Complete  Social Work Consult for [place] Planning/Counseling Complete  Palliative Care Screening Not Applicable  Some recent data might be hidden

## 2021-01-10 NOTE — Progress Notes (Signed)
Neurology Progress Note  S: Feeling better today. Still with 5/10 frontal HA at times. No further seizure activity per RN and per overnight RN in report to day RN.  No vision changes. No dysphagia. No numbness or tingling. LUE and LLE still weak. She states her LUE will move at times.   Interval history: NSU saw patient and she is not a surgical candidate. NSU wants MRI C/T/L spine to check for spinal lesions given patient's hyperreflexia. Decadron started yesterday.   Hemoc saw patient: Family meeting today to discuss Hospice, end of life care. 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 Consulted radiology oncologist to see if palliative radiation could be offered. Not a candidate for chemotherapy.   O: Current vital signs: BP (!) 141/93 (BP Location: Right Arm)   Pulse 88   Temp 98.5 F (36.9 C) (Oral)   Resp 14   Ht 5\' 8"  (1.727 m)   Wt 58.4 kg   SpO2 98%   BMI 19.58 kg/m  Vital signs in last 24 hours: Temp:  [98.5 F (36.9 C)-98.9 F (37.2 C)] 98.5 F (36.9 C) (07/09 0759) Pulse Rate:  [88-110] 88 (07/09 0759) Resp:  [14-20] 14 (07/09 0759) BP: (114-148)/(66-93) 141/93 (07/09 0759) SpO2:  [91 %-98 %] 98 % (07/09 0759) Weight:  [58.4 kg] 58.4 kg (07/09 0500)  GENERAL: Frail, fairly well appearing. Cachetic. NAD. HEENT: Normocephalic and atraumatic. LUNGS: Normal respiratory effort.  CV: RRR on tele.  Ext: warm. Psych: Affect lighter than yesterday.   NEURO:  Mental Status: Alert oriented x 3.  Speech/Language: speech is without aphasia or dysarthria.  Naming, repetition, fluency, and comprehension intact.  Cranial Nerves:  II: PERRL. Visual fields full. Left hemi neglect.  III, IV, VI: EOMI. Eyelids elevate symmetrically.  V: Sensation is intact to light touch and symmetrical to face.  VII: Smile is symmetrical. Able to puff cheeks and raise eyebrows.  VIII: hearing intact to voice. IX, X: Palate elevates symmetrically. Phonation is  normal.  MC:NOBSJGGE shrug 5/5. XII: tongue is midline without fasciculations. Motor:  Moves RUE/RLE. Able to lift RUE and hold up. RLE-able to move but only lifts about 5 inches. No movement in LUE/LLE today.  Left hemi neglect including motor.  Tone: is decreased LUE. Increased in LLE. Bulk decreased.  Sensation- Intact to light touch bilaterally. Extinction absent to light touch to DSS.  Coordination: FTN intact on RUE. Can not perform HKS. No drift RUE. No tremor, jerking, or shaking noted.  Gait- deferred.  Medications  Current Facility-Administered Medications:    acetaminophen (TYLENOL) tablet 650 mg, 650 mg, Oral, Q6H PRN **OR** acetaminophen (TYLENOL) suppository 650 mg, 650 mg, Rectal, Q6H PRN, [name], MD   aspirin EC tablet 81 mg, 81 mg, Oral, Daily, [name], MD, 81 mg at 01/09/21 1300   carvedilol (COREG) tablet 6.25 mg, 6.25 mg, Oral, BID WC, [name], MD, 6.25 mg at 01/09/21 1658   dexamethasone (DECADRON) tablet 4 mg, 4 mg, Oral, Q6H, Meyran, Ocie Cornfield, NP, 4 mg at 01/10/21 0518   enoxaparin (LOVENOX) injection 40 mg, 40 mg, Subcutaneous, Q24H, [name], MD, 40 mg at 01/09/21 1658   insulin aspart (novoLOG) injection 0-15 Units, 0-15 Units, Subcutaneous, TID WC, Madalyn Rob, MD   insulin aspart (novoLOG) injection 0-5 Units, 0-5 Units, Subcutaneous, QHS, Madalyn Rob, MD   levETIRAcetam (KEPPRA) tablet 750 mg, 750 mg, Oral, BID, Greta Doom, MD, 750 mg at 01/09/21 2209   sodium chloride  flush (NS) 0.9 % injection 3 mL, 3 mL, Intravenous, Q12H, [name], MD, 3 mL at 01/09/21 2209  Imaging:   MRI Brain  Motion degraded exam.  At least 10 enhancing parenchymal lesions are identified, compatible with intracranial metastatic disease. The largest lesions are located within the posterior right frontal lobe/motor strip (2.7 x 1.9 cm, with prominent surrounding vasogenic edema), left parietal lobe (11 mm, with moderate to  prominent surrounding vasogenic edema) and within the right hippocampus/crus of fornix (12 mm, with mild-to-moderate surrounding vasogenic edema). Regional mass effect associated with these dominant lesions without midline shift.  Additionally, subtle leptomeningeal metastatic disease is questioned along the right frontoparietal lobes.  Assessment: 75 yo female who presented for BLE edema, LUE edema and 2 episodes of LUE jerking accompanied by staring and dysarthric speech. MRI confirmed multiple metastatic lesions and vasogenic edema. We increased Keppra yesterday, and per report from RN and patient, no further seizures.   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  Recommendations/Plan:  -Given brain lesions, continue Keppra 750mg  po q12 hours.  -Continue steroids per NSU orders.  -Ativan 2mg  IV prn seizure lasting over 5 minutes and contact Neurology.  -Neurology will be available for questions. Call us back should she show any seizure like activity.   Pt seen by Clance Boll, MSN, APN-BC/Nurse Practitioner/Neuro. Plan discussed with and approved by MD. Pager: [phone]

## 2021-01-11 ENCOUNTER — Telehealth: Payer: Self-pay | Admitting: Internal Medicine

## 2021-01-11 DIAGNOSIS — I4891 Unspecified atrial fibrillation: Secondary | ICD-10-CM

## 2021-01-11 DIAGNOSIS — C7931 Secondary malignant neoplasm of brain: Principal | ICD-10-CM

## 2021-01-11 DIAGNOSIS — Z87891 Personal history of nicotine dependence: Secondary | ICD-10-CM

## 2021-01-11 DIAGNOSIS — R569 Unspecified convulsions: Secondary | ICD-10-CM

## 2021-01-11 DIAGNOSIS — Z853 Personal history of malignant neoplasm of breast: Secondary | ICD-10-CM

## 2021-01-11 DIAGNOSIS — G936 Cerebral edema: Secondary | ICD-10-CM

## 2021-01-11 DIAGNOSIS — C3490 Malignant neoplasm of unspecified part of unspecified bronchus or lung: Secondary | ICD-10-CM

## 2021-01-11 LAB — GLUCOSE, CAPILLARY: Glucose-Capillary: 96 mg/dL (ref 70–99)

## 2021-01-11 MED ORDER — LORAZEPAM 2 MG/ML PO CONC: 1.0000 mg | ORAL | 0 refills | Status: AC | PRN

## 2021-01-11 MED ORDER — MORPHINE SULFATE 20 MG/5ML PO SOLN: 2.5000 mg | ORAL | 0 refills | Status: AC | PRN

## 2021-01-11 MED ORDER — LORAZEPAM 2 MG/ML PO CONC: 1.0000 mg | ORAL | 0 refills | Status: DC | PRN

## 2021-01-11 MED ORDER — LEVETIRACETAM 750 MG PO TABS: 750.0000 mg | ORAL_TABLET | Freq: Two times a day (BID) | ORAL | 0 refills | Status: AC

## 2021-01-11 MED ORDER — LEVETIRACETAM 750 MG PO TABS: 750.0000 mg | ORAL_TABLET | Freq: Two times a day (BID) | ORAL | 0 refills | Status: DC

## 2021-01-11 MED ORDER — MORPHINE SULFATE 20 MG/5ML PO SOLN: 2.5000 mg | ORAL | 0 refills | Status: DC | PRN

## 2021-01-11 NOTE — Telephone Encounter (Signed)
Patient unable to fill prescription at [facility], this was confirmed with pharmacy tech.  Prescription sent to [facility] and patient's niece updated.

## 2021-01-11 NOTE — Discharge Summary (Addendum)
Name: Dorothy Li MRN: [id number] DOB: 16-Nov-1945 75 y.o. PCP: [facility] [place]  Date of Admission: 01/07/2021  7:31 PM Date of Discharge: 01/11/2021 Attending Physician: Velna Ochs, MD  Discharge Diagnosis: 1. Metastatic Brain Disease, complicated by vasogenic edema Dorothy seizures  Discharge Medications: Allergies as of 01/11/2021       Reactions   Penicillins Other (See Comments)   "blacks out" per pt   Latex Hives, Other (See Comments)   Gloves   Tape Itching, Rash   Bandage on breast from bx caused rash        Medication List     STOP taking these medications    acetaminophen 325 MG tablet Commonly known as: TYLENOL   alendronate 70 MG tablet Commonly known as: FOSAMAX   aspirin EC 81 MG tablet   Calcium-Vitamin D-Vitamin K 750-500-40 MG-UNT-MCG Tabs   carvedilol 6.25 MG tablet Commonly known as: COREG   feeding supplement Liqd   ibuprofen 200 MG tablet Commonly known as: ADVIL   LACTOBACILLUS PROBIOTIC PO   multivitamin with minerals tablet   pantoprazole 40 MG tablet Commonly known as: PROTONIX   sertraline 100 MG tablet Commonly known as: ZOLOFT   traZODone 50 MG tablet Commonly known as: DESYREL   Vitamin D3 75 MCG (3000 UT) Tabs       TAKE these medications    levETIRAcetam 750 MG tablet Commonly known as: KEPPRA Take 1 tablet (750 mg total) by mouth 2 (two) times daily.   LORazepam 2 MG/ML concentrated solution Commonly known as: ATIVAN Place 0.5 mLs (1 mg total) under the tongue every 4 (four) hours as needed for anxiety.   morphine 20 MG/5ML solution Take 0.6 mLs (2.4 mg total) by mouth every 2 (two) hours as needed for pain.        Disposition Dorothy follow-up:   Dorothy Li was discharged from [facility] in Stable condition to [facility].  At the hospital follow up visit please address:  1.  Pain Dorothy comfort measures.  2.  Labs / imaging needed at time of follow-up: none  3.   Pending labs/ test needing follow-up: none  Follow-up Appointments: None  Hospital Course by problem list: Dorothy Li is a 75 y.o female with a history Li,Dorothy Li, Dorothy Li, Dorothy Li, Dorothy Li.  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 Patient initially presented to the ED with chief complaint of left upper extremity swelling of 4 days duration. On review of system, she reported body twitching that lasts seconds to a few minutes but didn't  didn't know those were seizures. Never loss consciousness during the episodes but have had occasional urinary incontinence. Patient visited an urgent care a week prior for similar complaint of swelling in her left upper extremities Dorothy was given a compression stocking.Physical exam on presentation to the ED was unremarkable for swelling but did show Li. Patient later had a witnessed seizure-like activity in the ED. STAT EEG did not capture seizure or epileptiform activity but suggestive of mild to moderate diffuse encephalopathy, nonspecific etiology. She was given ativan Dorothy a load of keppra. 7/7 CT imaging  of the head showed diffuse edema in posterior frontal lobe Dorothy possible mass in the temporal lobe. Follow up  MRI finding showed diffuse vasogenic edema Dorothy up to ten diffuse enhancing lesion with the largest one being a right posterior frontal lobe lesion. The findings were consistent with metastatic disease. Important to note that CT scan of her head in May did not show any evidence of cerebral edema or metastatic disease. 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   Was seen by neurosurgery, Med-onc as well as rad-on. Patient evaluated by medical oncology who deferred palliative chemotherapy given other significant multiple comorbidities Dorothy appears cachexia and  do not believe she could recover to the point of getting strong enough to tolerate palliative chemotherapy It was determined that the constellation of symptoms Dorothy focal neurologic deficit were direct Dorothy indirect effects of the brain mets Dorothy its associated vasogenic edema.   Patient had significant interval improvement in mentation Dorothy as well as appetite since initiation of systemic steroid on 7/8   On 7/9 there was a family meeting consisted of niece who is her healthcare power of attorney, her husband, daughter Dorothy multiple other  nieces. Patient Dorothy her family have decided to pursue hospice care at home.   Subjective Patient says she did not have a goodnight sleep. Was up late watching a ' dumb' comedy show. Patient expressed understanding of what is going on Dorothy plan for discharge to home with Hospice care. Patient asked if she could be out by noon so she can watch her soap opera. Family present at bedside   Discharge vitals:   BP 125/67 (BP Location: Right Arm)   Pulse 91   Temp 98.4 F (36.9 C) (Oral)   Resp 16   Ht 5\' 8"  (1.727 m)   Wt 59.7 kg   SpO2 100%   BMI 20.01 kg/m  Discharge exam:  General: cachectic-appearing elderly female eating breakfast in bed. No acute distress. Head: Normocephalic. Atraumatic. CV: RRR. No murmurs, rubs, or gallops. No LE edema Pulmonary: Lungs CTAB. Normal effort. No wheezing or rales. Abdominal: Soft, nontender, nondistended. Normal bowel sounds. Extremities: Palpable pulses. Normal  ROM. Skin: Warm Dorothy dry. No obvious rash or lesions. Neuro: A&Ox3. Left sided Li. Psych: Normal mood Dorothy affect. Close to being tearful as hospice discharge plans were discussed.   Pertinent Labs, Studies, Dorothy Procedures:   Results for orders placed or performed during the hospital encounter of 01/07/21 (from the past 48 hour(s))  Glucose, capillary     Status: Abnormal   Collection Time: 01/09/21  4:56 PM  Result Value Ref Range   Glucose-Capillary 100 (H) 70 - 99 mg/dL    Comment: Glucose reference range applies only to samples taken after fasting for at least 8 hours.  Glucose, capillary     Status: Abnormal   Collection Time: 01/09/21 10:31 PM  Result Value Ref Range   Glucose-Capillary 193 (H) 70 - 99 mg/dL    Comment: Glucose reference range applies only to samples taken after fasting for at least 8 hours.  Glucose, capillary     Status: Abnormal   Collection Time: 01/10/21  7:57 AM  Result Value Ref Range   Glucose-Capillary 115 (H) 70 - 99 mg/dL    Comment: Glucose reference range applies only to samples taken after  fasting for at least 8 hours.   Comment 1 Notify RN    Comment 2 Document in Chart   Glucose, capillary     Status: Abnormal   Collection Time: 01/10/21 11:39 AM  Result Value Ref Range   Glucose-Capillary 211 (H) 70 - 99 mg/dL    Comment: Glucose reference range applies only to samples taken after fasting for at least 8 hours.   Comment 1 Notify RN    Comment 2 Document in Chart   Glucose, capillary     Status: Abnormal   Collection Time: 01/10/21  4:01 PM  Result Value Ref Range   Glucose-Capillary 145 (H) 70 - 99 mg/dL    Comment: Glucose reference range applies only to samples taken after fasting for at least 8 hours.   Comment 1 Notify RN    Comment 2 Document in Chart   Glucose, capillary     Status: Abnormal   Collection Time: 01/10/21  9:43 PM  Result Value Ref Range   Glucose-Capillary 187 (H) 70 - 99 mg/dL    Comment: Glucose reference  range applies only to samples taken after fasting for at least 8 hours.  Glucose, capillary     Status: None   Collection Time: 01/11/21  7:16 AM  Result Value Ref Range   Glucose-Capillary 96 70 - 99 mg/dL    Comment: Glucose reference range applies only to samples taken after fasting for at least 8 hours.   Comment 1 Notify RN    Comment 2 Document in Chart      CT Head Wo Contrast  Result Date: 01/08/2021 CLINICAL DATA:  Neuro deficit, acute, stroke suspected. Left arm weakness for 4 days. Altered mental status. EXAM: CT HEAD WITHOUT CONTRAST TECHNIQUE: Contiguous axial images were obtained from the base of the skull through the vertex without intravenous contrast. COMPARISON:  Prior head CT 11/11/2020. FINDINGS: Brain: Cerebral volume is normal for age. There is extensive white matter, Dorothy to a lesser degree cortical, edema within the posterior frontal lobes Dorothy parietal lobes (right greater than left). Subtle patchy abnormal hypodensity is also questioned within the bilateral occipital lobes. Additional cortical/subcortical hypodensity within the medial right temporal lobe/hippocampus. Suggestion of a 9 mm superimposed medial right temporal lobe mass (series 4, image 14). No evidence of acute intracranial hemorrhage. No extra-axial fluid collection. No midline shift. Vascular: No hyperdense vessel.  Atherosclerotic calcifications. Skull: Normal. Negative for fracture or focal lesion. Sinuses/Orbits: Visualized orbits show no acute finding. 2.1 cm right maxillary sinus mucous retention cyst. Mild left maxillary sinus mucosal thickening. Trace right frontal Dorothy bilateral ethmoid sinus mucosal thickening. These results were called by telephone at the time of interpretation on 01/08/2021 at 9:48 am to provider [facility] , who verbally acknowledged these results. IMPRESSION: Extensive white matter, Dorothy to a lesser degree cortical, edema within the posterior frontal lobes Dorothy parietal lobes (right  greater than left). Subtle patchy abnormal hypodensity is also questioned within the bilateral occipital lobes. 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 However, underlying metastatic lesions are also a consideration Dorothy a contrast-enhanced brain MRI is recommended for further evaluation. Additional cortical/subcortical hypodensity within the medial right temporal lobe/hippocampus. A superimposed 9 mm medial right temporal lobe mass is also questioned, Dorothy this may reflect edema related to a metastatic lesion. However, alternative etiologies such as encephalitis (including herpes encephalitis), seizure related edema or acute right PCA territory ischemia cannot be excluded. This too would be better characterized with a contrast-enhanced brain  MRI. No midline shift or herniation. Paranasal sinus disease, as described. Electronically Signed   By: Kellie Simmering DO   On: 01/08/2021 09:48   MR BRAIN W WO CONTRAST  Result Date: 01/09/2021 CLINICAL DATA:  Seizure, abnormal neuro exam; neuro deficit, acute, stroke suspected; brain mass or lesion. EXAM: MRI HEAD WITHOUT Dorothy WITH CONTRAST TECHNIQUE: Multiplanar, multiecho pulse sequences of the brain Dorothy surrounding structures were obtained without Dorothy with intravenous contrast. CONTRAST:  5.59mL GADAVIST GADOBUTROL 1 MMOL/ML IV SOLN COMPARISON:  Prior head CT examinations 01/08/2021 Dorothy earlier. PET-CT 11/14/2020. FINDINGS: Brain: Multiple sequences are significantly motion degraded, limiting evaluation. Most notably, there is severe motion degradation of the axial precontrast T1 weighted sequence, severe motion degradation of the coronal T2 weighted sequence oriented perpendicular to the long axis of the hippocampi, severe motion degradation of the T2/FLAIR sequence oriented perpendicular to the long axis of the hippocampi, severe motion degradation of the whole brain coronal T2 weighted sequence, severe motion degradation of the  axial T1 weighted postcontrast sequence Dorothy severe motion degradation of the coronal T1 weighted postcontrast sequence. There are multiple enhancing parenchymal lesions with surrounding vasogenic edema, as follows. Findings are compatible with intracranial metastatic disease. 2.7 x 1.9 cm lesion along the posterior right frontal lobe, involving the motor strip (series 16, image 41). Prominent surrounding vasogenic edema. Subtle adjacent enhancing leptomeningeal metastatic disease is also questioned along the right frontoparietal lobes (for instance as seen on series 19, images 10-12). 7 mm enhancing lesion within the mid to posterior left frontal lobe (series 19, image 19). 5 mm enhancing lesion within the mid left frontal lobe (series 19, image 17). 7 mm enhancing lesion within the mid to posterior left frontal lobe (series 19, image 18). 11 mm enhancing lesion within the left parietal lobe with moderate to prominent surrounding vasogenic edema (series 19, image 16). 4 mm enhancing lesion within the right parietal lobe (series 19, image 8). 4 mm enhancing lesion within the right temporoparietal junction (series 19, image 9). 12 mm lesion within the right hippocampus/crus of fornix (series 19, image 10). Mild to moderate surrounding edema. 3 mm lesion within the right occipital lobe (series 19, image 8). Possible additional subcentimeter enhancing lesion with mild surrounding edema in the medial right occipital lobe (versus small chronic cortically based infarct) (series 8, image 15) (series 7, image 15) (series 19, image 11). 3 mm lesion within the inferior left parietal lobe (series 19, image 15). There is no acute infarct. No chronic intracranial blood products. No extra-axial fluid collection. No midline shift. Vascular: Expected proximal arterial flow voids. Skull Dorothy upper cervical spine: No focal suspicious marrow lesion. Sinuses/Orbits: Visualized orbits show no acute finding. 13 mm right maxillary sinus  mucous retention cyst. IMPRESSION: Significantly motion degraded examination, as described Dorothy limiting evaluation. This includes severe motion degradation of multiple post-contrast sequences. As a result, small metastatic lesions may be obscured Dorothy a repeat examination should be considered when the patient is better able to tolerate the study. At least 10 enhancing parenchymal lesions are identified, compatible with intracranial metastatic disease. The largest lesions are located within the posterior right frontal lobe/motor strip (2.7 x 1.9 cm, with prominent surrounding vasogenic edema), left parietal lobe (11 mm, with moderate to prominent surrounding vasogenic edema) Dorothy within the right hippocampus/crus of fornix (12 mm, with mild-to-moderate surrounding vasogenic edema). Regional mass effect associated with these dominant lesions without midline shift. Additionally, subtle leptomeningeal metastatic disease is questioned along the right frontoparietal lobes. Electronically Signed  By: Kellie Simmering DO   On: 01/09/2021 13:13   EEG adult  Result Date: 01/08/2021 Lora Havens, MD     01/08/2021 12:40 PM Patient Name: CHARINA FONS MRN: [id number] Epilepsy Attending: Lora Havens Referring Physician/Provider: Anibal Henderson, NP Date: 01/08/2021 Duration: 23.48 mins Patient history: 75 year old female with history of right breast cancer status posttreatment who was noted to have left arm weakness Dorothy twitching.  EEG to evaluate for seizures. Level of alertness: Awake, asleep AEDs during EEG study: Ativan, Keppra Technical aspects: This EEG study was done with scalp electrodes positioned according to the 10-20 International system of electrode placement. Electrical activity was acquired at a sampling rate of 500Hz  Dorothy reviewed with a high frequency filter of 70Hz  Dorothy a low frequency filter of 1Hz . EEG data were recorded continuously Dorothy digitally stored. Description: The posterior dominant rhythm consists of  8-9 Hz activity of moderate voltage (25-35 uV) seen predominantly in posterior head regions, symmetric Dorothy reactive to eye opening Dorothy eye closing. Sleep was characterized by vertex waves, sleep spindles (12 to 14 Hz), maximal frontocentral region. EEG showed continuous generalized 5 to 6 Hz theta-delta slowing with overriding 15 to 18 Hz beta activity distributed symmetrically Dorothy diffusely. Hyperventilation Dorothy photic stimulation were not performed.   ABNORMALITY - Continuous slow, generalized - Excessive beta, generalized IMPRESSION: This study is suggestive of mild to moderate diffuse encephalopathy, nonspecific etiology. No seizures or epileptiform discharges were seen throughout the recording. Lora Havens     Discharge Instructions:   Signed: Tamsen Snider, MD PGY3 Internal Medicine [phone]  .

## 2021-01-11 NOTE — Progress Notes (Signed)
The patient has a family conference and I was asked to participate by telephone.  After reviewing the patient's information I discussed with the patient and her family what would be entailed most likely with a course of radiation treatment to the brain.  Based on the work-up thus far, this likely would be carried out with a course of whole brain radiation treatment for 2 weeks.  I discussed in some detail the possible side effects and risks of treatment as well as possible benefits as well.  All of the patient's questions were answered.  The family is in the process of weighing all options and deciding how aggressive to be with treatment.  After this discussion the family and the patient indicated that they did not wish to proceed with any radiation treatment.  They were appreciative of the information they received and they were encouraged to reach out to [place] if we can be of any further assistance.  They appeared very comfortable with this plan.  ------------------------------------------------  Jodelle Gross, MD, PhD

## 2021-01-11 NOTE — TOC Transition Note (Signed)
Transition of Care [facility]) - CM/SW Discharge Note   Patient Details  Name: Dorothy Li MRN: [id number] Date of Birth: [birth year]  [facility] of [facility]) CM/SW Contact:  Bartholomew Crews, RN Phone Number: [phone] 01/11/2021, 1:08 PM   Clinical Narrative:     Damaris Schooner with patient and [name] at bedside. Hospital bed delivered last night. Family to provide transport home. DC home with [facility] hospice. No further TOC needs identified at this time.   Final next level of care: Home w Hospice Care Barriers to Discharge: No Barriers Identified   Patient Goals and CMS Choice Patient states their goals for this hospitalization and ongoing recovery are:: home with hospice CMS Medicare.gov Compare Post Acute Care list provided to:: Patient Choice offered to / list presented to : Patient, HC POA / Guardian [name], niece/POA)  Discharge Placement                       Discharge Plan and Services In-house Referral: Hospice / Palliative Care Discharge Planning Services: CM Consult Post Acute Care Choice: Hospice          DME Arranged: Hospital bed DME Agency: [facility] [place] Date DME Agency Contacted: 01/10/21 Time DME Agency Contacted: 1228 Representative spoke with at DME Agency: [place] at [place]: RN [place]: [facility] [place] Date [place]: 01/10/21 Time [place]: 1228 Representative spoke with at [place]: [place] at [place] ([place]) Interventions     Readmission Risk Interventions Readmission Risk Prevention Plan 10/19/2020  Transportation Screening Complete  PCP or Specialist Appt within 3-5 Days Not Complete  Not Complete comments Per [place], pt has appt on June 7.  She will attempt to move this appt up on Monday.  [place] or Home Care Consult Complete  Social Work Consult for [place] Planning/Counseling Complete  Palliative Care  Screening Not Applicable  Some recent data might be hidden

## 2021-01-11 NOTE — Discharge Instructions (Addendum)
Ms.Gaspari you will be discharge home and hospice care has been set up. I have sent medications for anxiety and pain to you pharmacy. I stopped most of your medications because they do not benefit you at this time. The other medications listed can be stopped if you no longer would like to take them. It was a pleasure to take care of you and meet your family.   FMLA paperwork can be sent to [phone]. I can be reached on my cellphone  [phone].   My best,  Tamsen Snider, MD

## 2021-03-05 DEATH — deceased

## 2022-04-09 IMAGING — CR DG RIBS W/ CHEST 3+V*L*
6 series · 6 of 6 positions shown · non-contrast
Comparison: Radiograph earlier today.  Chest CT 10/29/2020

CLINICAL DATA: Dizziness.  Pain.

EXAM:
LEFT RIBS AND CHEST - 3+ VIEW

[chest ap (1 of 2)]
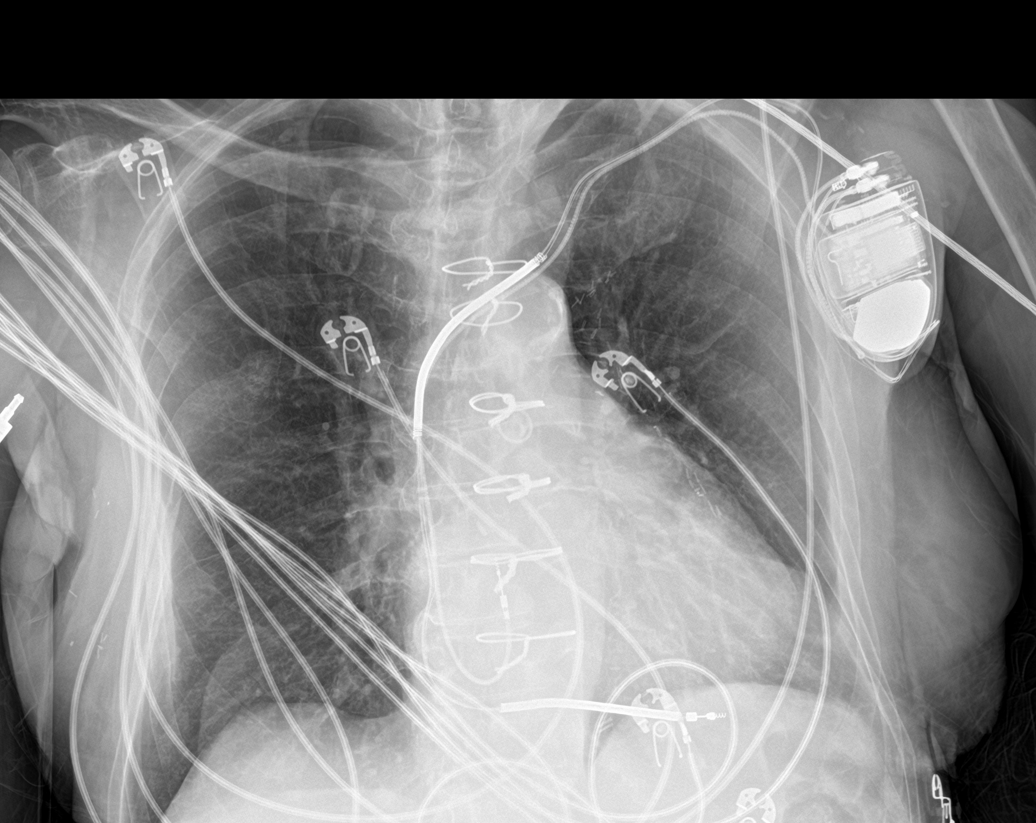

[rib ap (1 of 2)]
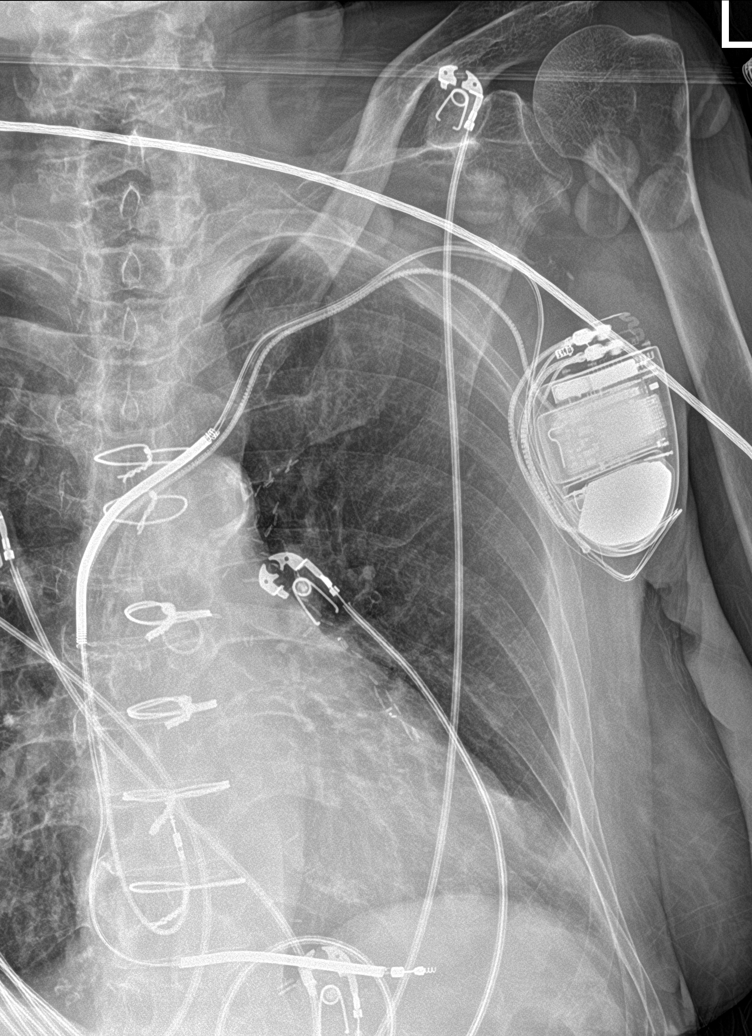

[chest ap (2 of 2)]
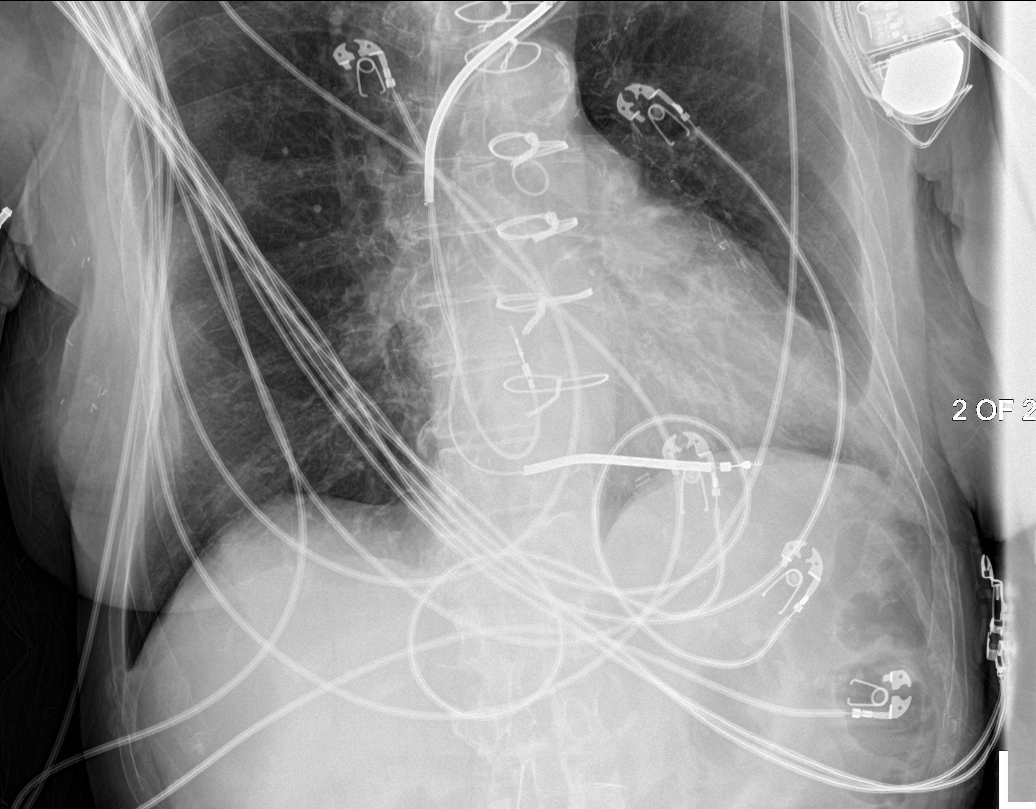

[rib ap (2 of 2)]
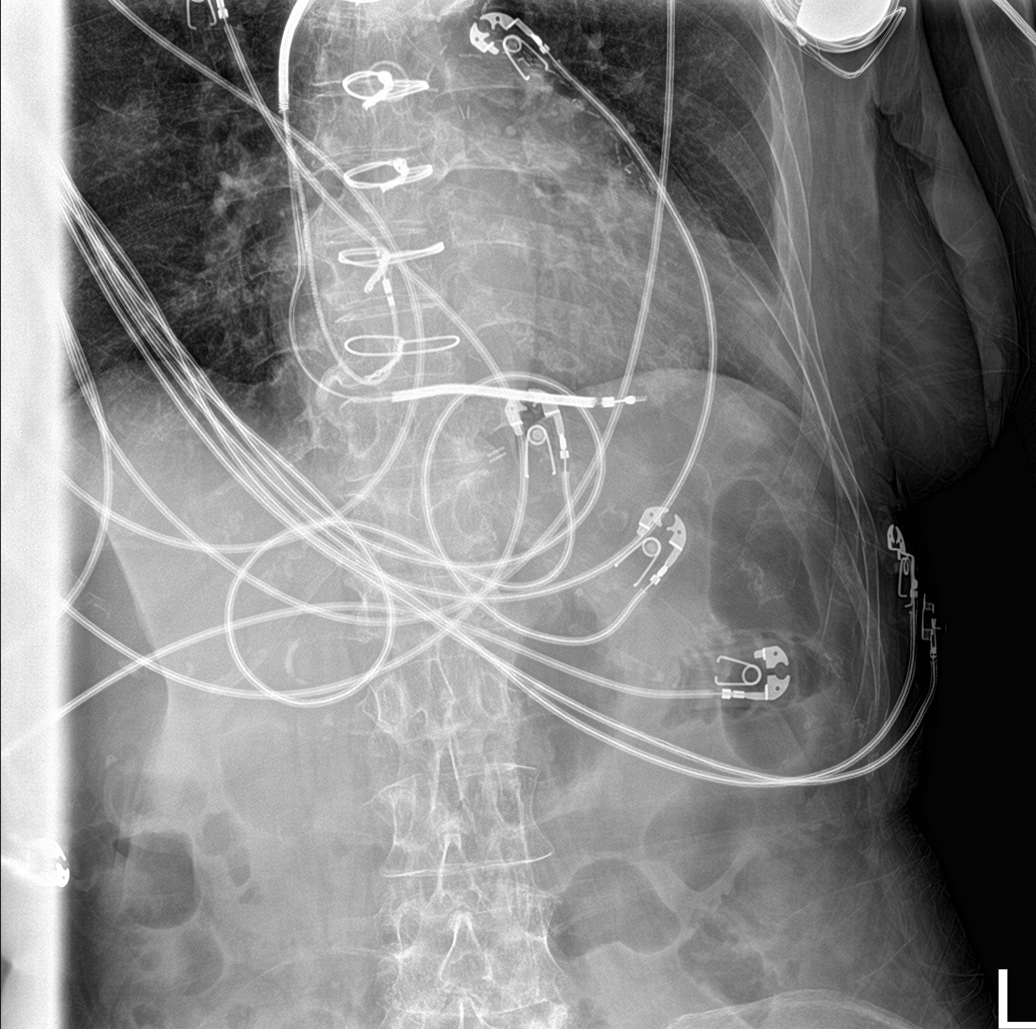

[rib ap obl (1 of 2)]
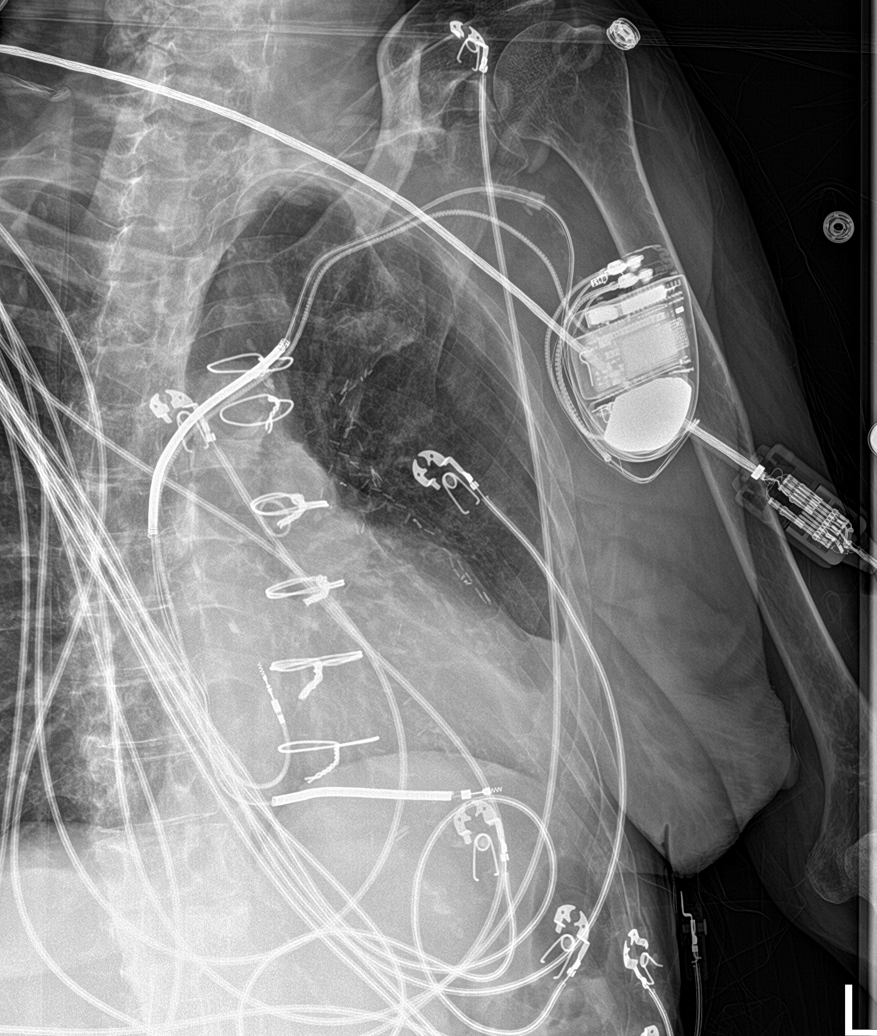

[rib ap obl (2 of 2)]
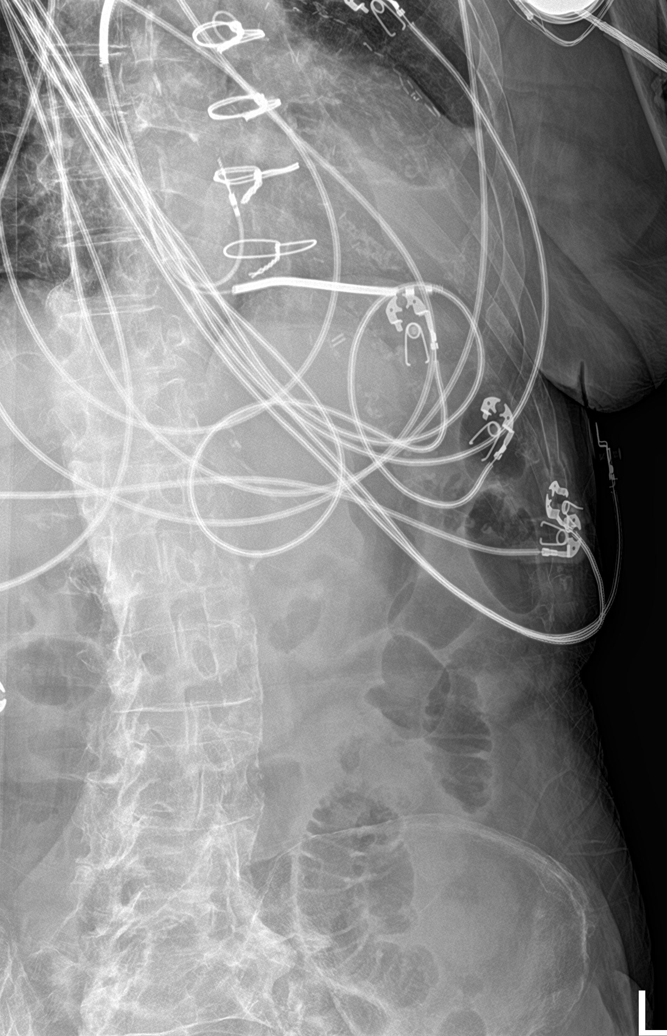

[6 of 6 positions shown; findings below may reference images not displayed]

FINDINGS: Post median sternotomy and CABG. Left-sided pacemaker in place.
Similar cardiomegaly to earlier today. Slight vascular congestion
from earlier today. No pneumothorax or significant pleural effusion.
Left basilar opacity on prior CT not well seen by radiograph. No
acute osseous abnormalities. Multiple overlying monitoring devices.
IMPRESSION: Similar cardiomegaly. Slight vascular congestion from earlier today.

## 2022-04-09 IMAGING — CT CT HEAD W/O CM
4 series · 17 of 47 positions shown, 19 images · non-contrast
Comparison: CT head dated October 15, 2020.

CLINICAL DATA: Near syncope.  Dizziness.

EXAM:
CT HEAD WITHOUT CONTRAST
TECHNIQUE: Contiguous axial images were obtained from the base of the skull
through the vertex without intravenous contrast.

[Series 3: head wo · axial · 0.44mm/px · z∈[+1131,+1251]mm · 7 of 34 slices shown, 9 images]
[im 5/34  brain]
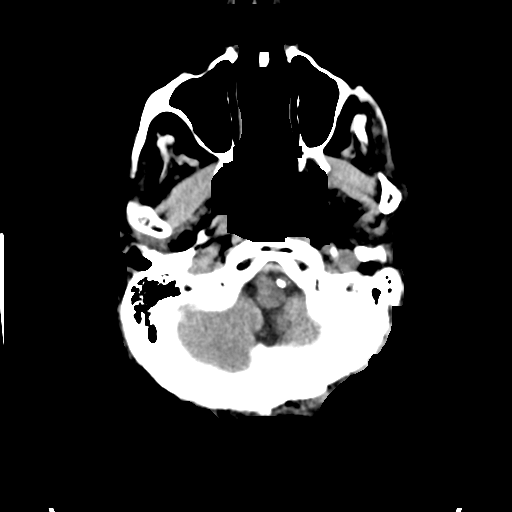
[im 5/34  bone]
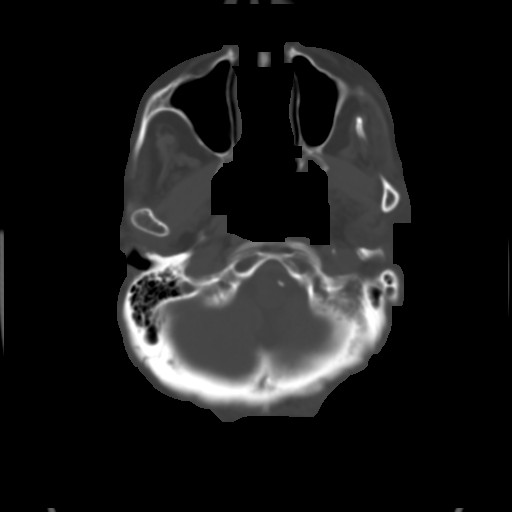
[im 9/34  brain]
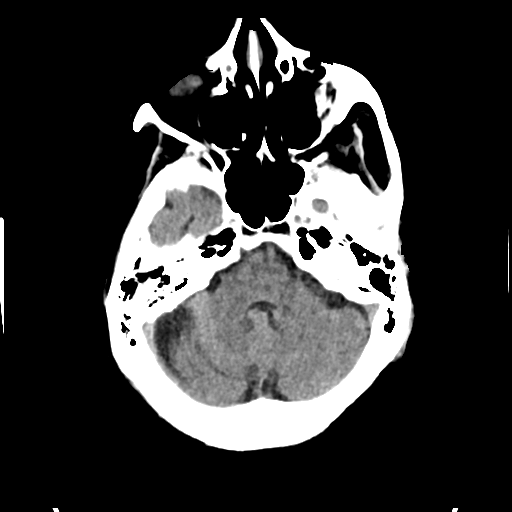
[im 13/34  brain]
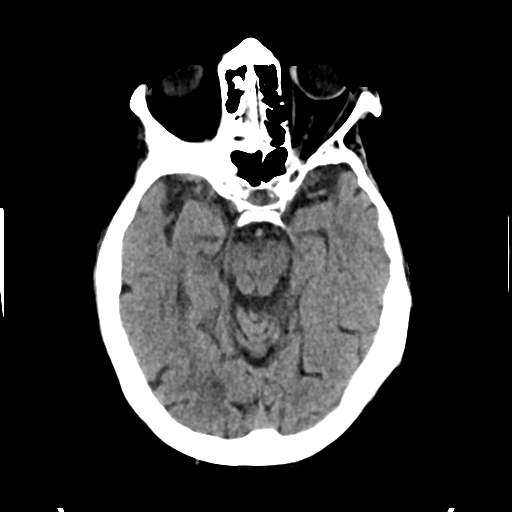
[im 17/34  brain]
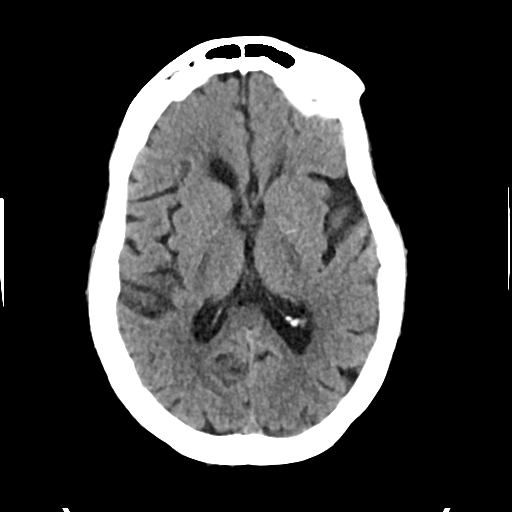
[im 21/34  brain]
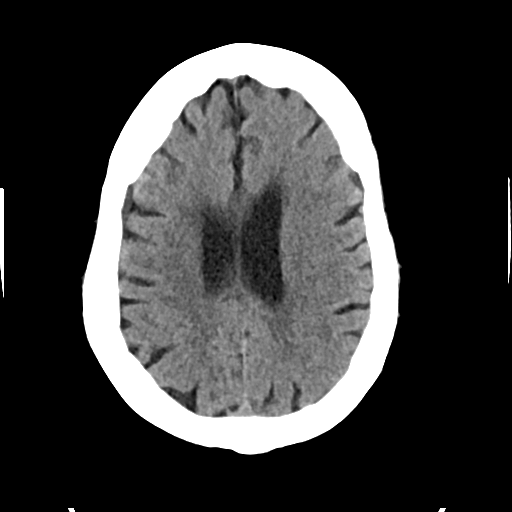
[im 21/34  bone]
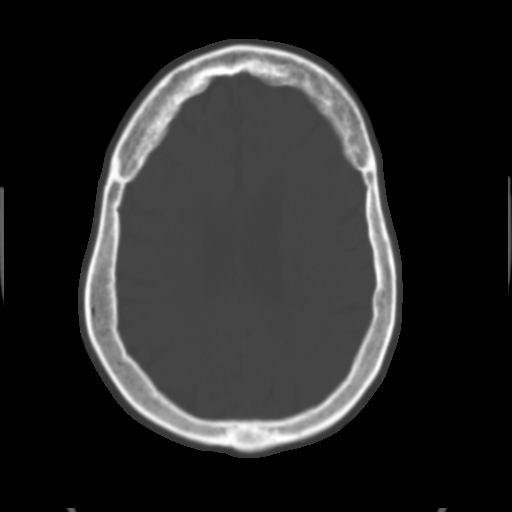
[im 25/34  brain]
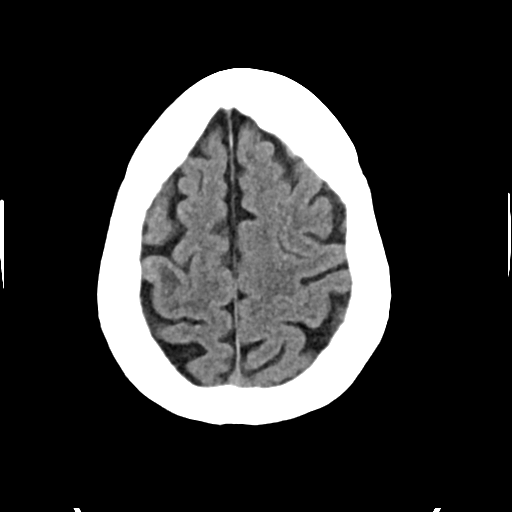
[im 29/34  brain]
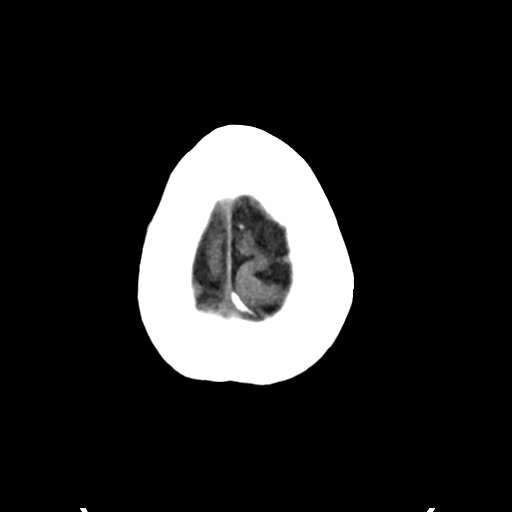

[Series 4: head bone · axial · 0.44mm/px · z∈[+1127,+1185]mm · 4 of 84 slices shown]
[im 9/84  bone]
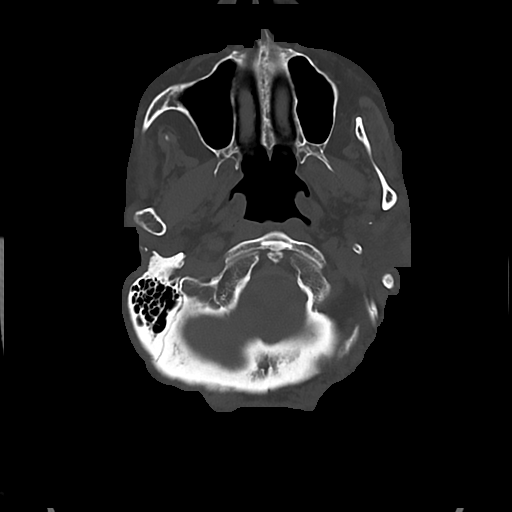
[im 17/84  bone]
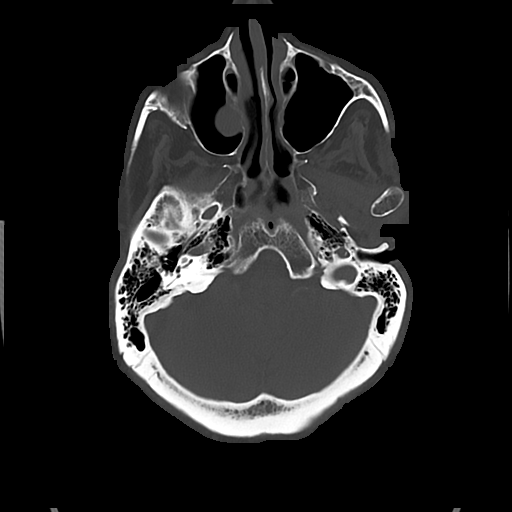
[im 25/84  bone]
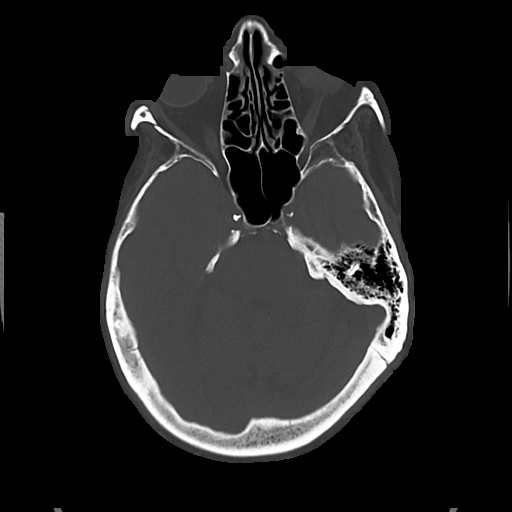
[im 38/84  bone]
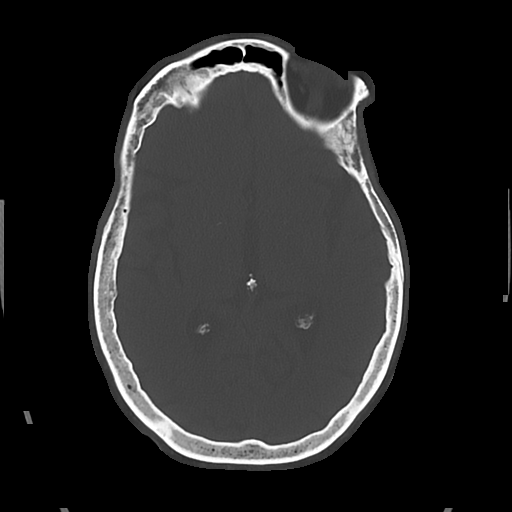

[Series 5: cor soft · coronal · 0.33mm/px · 3 of 71 slices shown]
[im 24/71  brain]
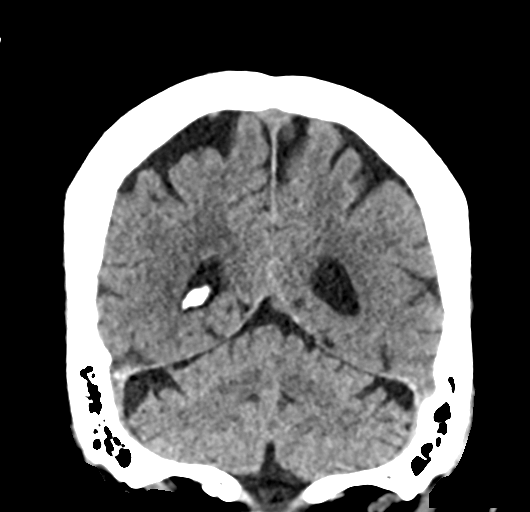
[im 32/71  brain]
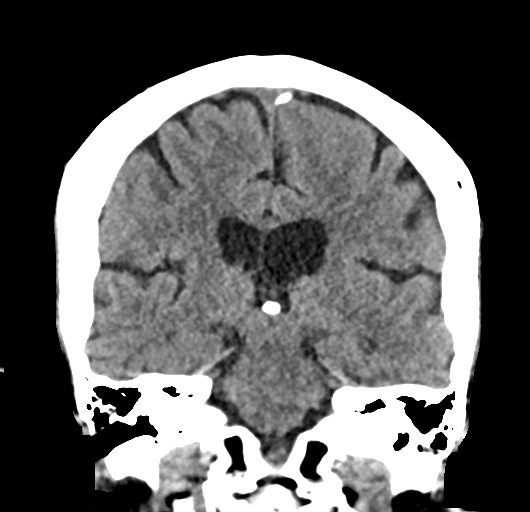
[im 39/71  brain]
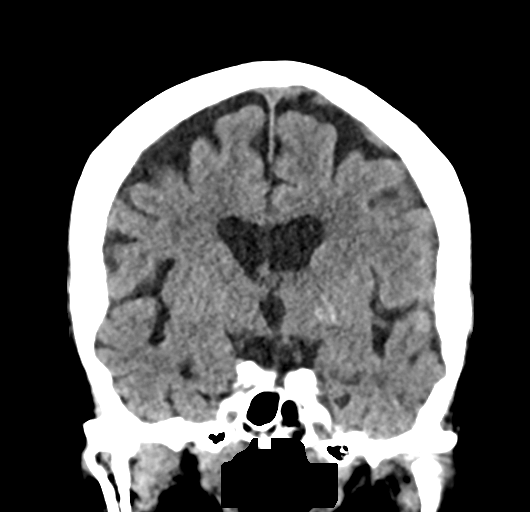

[Series 6: sag soft · sagittal · 0.33mm/px · 3 of 58 slices shown]
[im 20/58  brain]
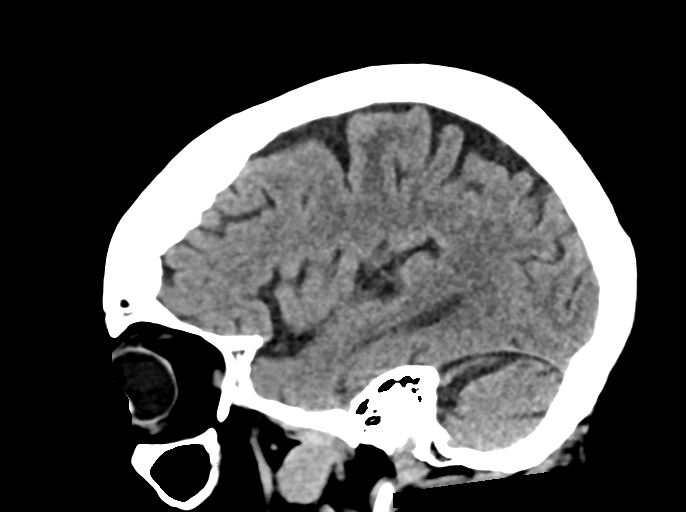
[im 29/58  brain]
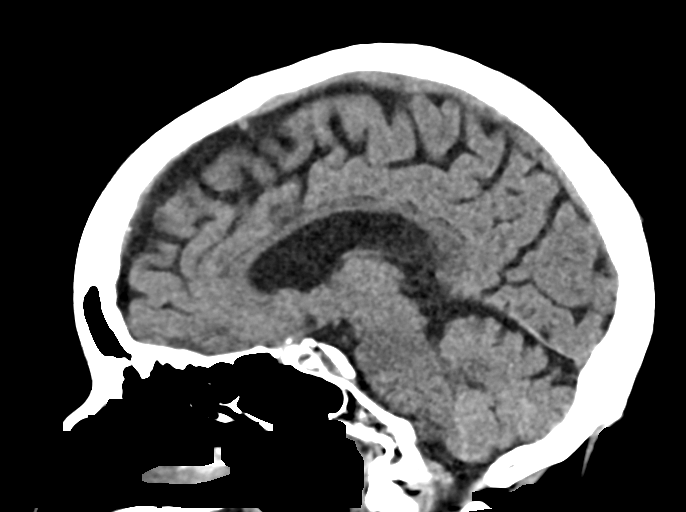
[im 39/58  brain]
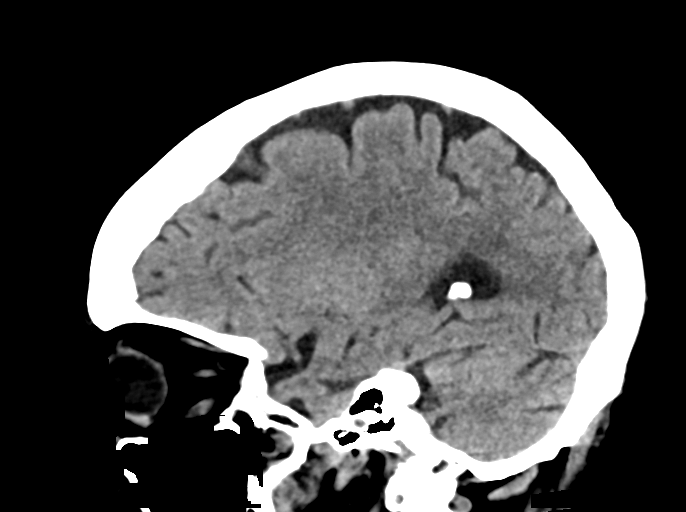

[17 of 47 positions shown; findings below may reference images not displayed]

FINDINGS: Brain: No evidence of acute infarction, hemorrhage, hydrocephalus,
extra-axial collection or mass lesion/mass effect. Stable atrophy
and chronic microvascular ischemic changes.

Vascular: Calcified atherosclerosis at the skullbase. No hyperdense
vessel.

Skull: Normal. Negative for fracture or focal lesion.

Sinuses/Orbits: No acute finding. Chronic retention cyst in the
right maxillary sinus.

Other: None.
IMPRESSION: 1. No acute intracranial abnormality.
2. Stable atrophy and chronic microvascular ischemic changes.
